# Patient Record
Sex: Female | Born: 1940 | Race: Black or African American | Hispanic: No | Marital: Married | Smoking: Former smoker
Health system: Southern US, Community
[De-identification: ages and names within clinical notes are randomized; demographics above are authoritative.]

## PROBLEM LIST (undated history)

## (undated) DIAGNOSIS — E039 Hypothyroidism, unspecified: Secondary | ICD-10-CM

## (undated) DIAGNOSIS — D649 Anemia, unspecified: Secondary | ICD-10-CM

## (undated) DIAGNOSIS — I4891 Unspecified atrial fibrillation: Secondary | ICD-10-CM

## (undated) DIAGNOSIS — E119 Type 2 diabetes mellitus without complications: Secondary | ICD-10-CM

## (undated) DIAGNOSIS — N184 Chronic kidney disease, stage 4 (severe): Secondary | ICD-10-CM

## (undated) DIAGNOSIS — I10 Essential (primary) hypertension: Secondary | ICD-10-CM

## (undated) DIAGNOSIS — C3490 Malignant neoplasm of unspecified part of unspecified bronchus or lung: Secondary | ICD-10-CM

## (undated) HISTORY — DX: Unspecified atrial fibrillation: I48.91

## (undated) HISTORY — DX: Type 2 diabetes mellitus without complications: E11.9

## (undated) HISTORY — DX: Anemia, unspecified: D64.9

## (undated) HISTORY — DX: Chronic kidney disease, stage 4 (severe): N18.4

## (undated) HISTORY — DX: Hypothyroidism, unspecified: E03.9

## (undated) HISTORY — DX: Essential (primary) hypertension: I10

## (undated) HISTORY — DX: Malignant neoplasm of unspecified part of unspecified bronchus or lung: C34.90

## (undated) SURGERY — EGD (ESOPHAGOGASTRODUODENOSCOPY)
Anesthesia: Monitor Anesthesia Care

---

## 2004-10-28 ENCOUNTER — Ambulatory Visit: Payer: Self-pay | Admitting: Family Medicine

## 2004-11-19 ENCOUNTER — Ambulatory Visit: Payer: Self-pay | Admitting: Family Medicine

## 2004-12-02 ENCOUNTER — Ambulatory Visit: Payer: Self-pay | Admitting: Gastroenterology

## 2007-10-21 ENCOUNTER — Inpatient Hospital Stay: Payer: Self-pay | Admitting: Internal Medicine

## 2008-01-09 ENCOUNTER — Ambulatory Visit: Payer: Self-pay | Admitting: Unknown Physician Specialty

## 2008-01-09 LAB — HM COLONOSCOPY

## 2008-03-17 ENCOUNTER — Ambulatory Visit: Payer: Self-pay | Admitting: Family Medicine

## 2009-03-30 ENCOUNTER — Ambulatory Visit: Payer: Self-pay | Admitting: Family Medicine

## 2010-04-13 ENCOUNTER — Ambulatory Visit: Payer: Self-pay | Admitting: Family Medicine

## 2010-12-31 ENCOUNTER — Ambulatory Visit: Payer: Self-pay | Admitting: Internal Medicine

## 2011-01-13 ENCOUNTER — Ambulatory Visit: Payer: Self-pay | Admitting: Specialist

## 2011-01-21 ENCOUNTER — Ambulatory Visit: Payer: Self-pay | Admitting: Internal Medicine

## 2011-01-21 LAB — COMPREHENSIVE METABOLIC PANEL
Albumin: 2.9 g/dL — ABNORMAL LOW (ref 3.4–5.0)
Alkaline Phosphatase: 95 U/L (ref 50–136)
BUN: 8 mg/dL (ref 7–18)
Bilirubin,Total: 0.4 mg/dL (ref 0.2–1.0)
Creatinine: 1.09 mg/dL (ref 0.60–1.30)
Glucose: 121 mg/dL — ABNORMAL HIGH (ref 65–99)
Osmolality: 273 (ref 275–301)
SGOT(AST): 21 U/L (ref 15–37)
SGPT (ALT): 10 U/L — ABNORMAL LOW
Sodium: 137 mmol/L (ref 136–145)
Total Protein: 8.3 g/dL — ABNORMAL HIGH (ref 6.4–8.2)

## 2011-01-21 LAB — CBC CANCER CENTER
Basophil #: 0 x10 3/mm (ref 0.0–0.1)
Basophil %: 0.5 %
Eosinophil %: 1.1 %
HGB: 9.1 g/dL — ABNORMAL LOW (ref 12.0–16.0)
Lymphocyte #: 1.5 x10 3/mm (ref 1.0–3.6)
MCV: 82 fL (ref 80–100)
Monocyte #: 0.5 x10 3/mm (ref 0.0–0.7)
Monocyte %: 9 %
Neutrophil %: 62.9 %
Platelet: 358 x10 3/mm (ref 150–440)
RBC: 3.47 10*6/uL — ABNORMAL LOW (ref 3.80–5.20)
WBC: 5.8 x10 3/mm (ref 3.6–11.0)

## 2011-01-21 LAB — PROTIME-INR
INR: 1.1
Prothrombin Time: 14.1 secs (ref 11.5–14.7)

## 2011-01-21 LAB — FOLATE: Folic Acid: 40.1 ng/mL (ref 3.1–100.0)

## 2011-01-21 LAB — FERRITIN: Ferritin (ARMC): 20 ng/mL (ref 8–388)

## 2011-01-25 ENCOUNTER — Ambulatory Visit: Payer: Self-pay | Admitting: Internal Medicine

## 2011-02-04 ENCOUNTER — Ambulatory Visit: Payer: Self-pay | Admitting: Internal Medicine

## 2011-02-10 ENCOUNTER — Ambulatory Visit: Payer: Self-pay | Admitting: Internal Medicine

## 2011-02-10 LAB — APTT: Activated PTT: 36.7 secs — ABNORMAL HIGH (ref 23.6–35.9)

## 2011-02-10 LAB — PROTIME-INR
INR: 1.1
Prothrombin Time: 14.3 secs (ref 11.5–14.7)

## 2011-02-14 LAB — CBC CANCER CENTER
Basophil #: 0 x10 3/mm (ref 0.0–0.1)
Basophil %: 0.1 %
HCT: 31.5 % — ABNORMAL LOW (ref 35.0–47.0)
HGB: 10.2 g/dL — ABNORMAL LOW (ref 12.0–16.0)
Lymphocyte %: 14.7 %
Monocyte %: 0.2 %
Neutrophil #: 4 x10 3/mm (ref 1.4–6.5)
RBC: 3.91 10*6/uL (ref 3.80–5.20)
RDW: 16.6 % — ABNORMAL HIGH (ref 11.5–14.5)
WBC: 4.7 x10 3/mm (ref 3.6–11.0)

## 2011-02-14 LAB — BASIC METABOLIC PANEL
Anion Gap: 11 (ref 7–16)
Chloride: 100 mmol/L (ref 98–107)
Co2: 27 mmol/L (ref 21–32)
Osmolality: 282 (ref 275–301)

## 2011-02-21 LAB — CBC CANCER CENTER
Basophil #: 0 x10 3/mm (ref 0.0–0.1)
Basophil %: 0.4 %
Eosinophil %: 1.7 %
HGB: 9.5 g/dL — ABNORMAL LOW (ref 12.0–16.0)
Lymphocyte #: 1.2 x10 3/mm (ref 1.0–3.6)
Lymphocyte %: 37.3 %
MCH: 25.6 pg — ABNORMAL LOW (ref 26.0–34.0)
MCV: 78 fL — ABNORMAL LOW (ref 80–100)
Monocyte %: 4.4 %
Neutrophil %: 56.2 %
RBC: 3.7 10*6/uL — ABNORMAL LOW (ref 3.80–5.20)
RDW: 17.2 % — ABNORMAL HIGH (ref 11.5–14.5)
WBC: 3.1 x10 3/mm — ABNORMAL LOW (ref 3.6–11.0)

## 2011-02-28 LAB — CBC CANCER CENTER
Basophil %: 0.6 %
Eosinophil %: 0.4 %
Lymphocyte #: 1.1 x10 3/mm (ref 1.0–3.6)
MCH: 25 pg — ABNORMAL LOW (ref 26.0–34.0)
MCV: 78 fL — ABNORMAL LOW (ref 80–100)
Monocyte #: 0.7 x10 3/mm (ref 0.0–0.7)
Monocyte %: 17 %
RBC: 3.41 10*6/uL — ABNORMAL LOW (ref 3.80–5.20)
WBC: 4 x10 3/mm (ref 3.6–11.0)

## 2011-02-28 LAB — BASIC METABOLIC PANEL
BUN: 11 mg/dL (ref 7–18)
Calcium, Total: 8.6 mg/dL (ref 8.5–10.1)
EGFR (African American): 59 — ABNORMAL LOW
EGFR (Non-African Amer.): 49 — ABNORMAL LOW
Glucose: 169 mg/dL — ABNORMAL HIGH (ref 65–99)
Osmolality: 281 (ref 275–301)

## 2011-02-28 LAB — HEPATIC FUNCTION PANEL A (ARMC)
Albumin: 2.5 g/dL — ABNORMAL LOW (ref 3.4–5.0)
Alkaline Phosphatase: 93 U/L (ref 50–136)
Bilirubin, Direct: 0.1 mg/dL (ref 0.00–0.20)
Total Protein: 7.4 g/dL (ref 6.4–8.2)

## 2011-03-04 ENCOUNTER — Ambulatory Visit: Payer: Self-pay | Admitting: Internal Medicine

## 2011-03-07 LAB — CBC CANCER CENTER
Basophil %: 0.6 %
Eosinophil %: 0.4 %
Lymphocyte #: 1.5 x10 3/mm (ref 1.0–3.6)
Lymphocyte %: 30.7 %
MCH: 25.5 pg — ABNORMAL LOW (ref 26.0–34.0)
MCV: 79 fL — ABNORMAL LOW (ref 80–100)
Monocyte #: 0.4 x10 3/mm (ref 0.0–0.7)
Neutrophil %: 59.4 %
Platelet: 269 x10 3/mm (ref 150–440)
WBC: 4.8 x10 3/mm (ref 3.6–11.0)

## 2011-03-11 LAB — BASIC METABOLIC PANEL
Anion Gap: 11 (ref 7–16)
Calcium, Total: 9.2 mg/dL (ref 8.5–10.1)
Chloride: 98 mmol/L (ref 98–107)
Co2: 27 mmol/L (ref 21–32)
EGFR (African American): 57 — ABNORMAL LOW
Osmolality: 281 (ref 275–301)

## 2011-03-18 LAB — IRON AND TIBC
Iron Bind.Cap.(Total): 291 ug/dL (ref 250–450)
Iron Saturation: 21 %
Iron: 61 ug/dL (ref 50–170)
Unbound Iron-Bind.Cap.: 230 ug/dL

## 2011-03-18 LAB — CBC CANCER CENTER
Basophil #: 0 x10 3/mm (ref 0.0–0.1)
Basophil %: 0.2 %
Eosinophil #: 0.1 x10 3/mm (ref 0.0–0.7)
Eosinophil %: 3 %
HCT: 25.9 % — ABNORMAL LOW (ref 35.0–47.0)
Lymphocyte %: 55.5 %
MCHC: 32.9 g/dL (ref 32.0–36.0)
Monocyte %: 3.8 %
Neutrophil #: 0.8 x10 3/mm — ABNORMAL LOW (ref 1.4–6.5)
Neutrophil %: 37.5 %
Platelet: 238 x10 3/mm (ref 150–440)
RBC: 3.26 10*6/uL — ABNORMAL LOW (ref 3.80–5.20)
RDW: 19.3 % — ABNORMAL HIGH (ref 11.5–14.5)
WBC: 2.1 x10 3/mm — ABNORMAL LOW (ref 3.6–11.0)

## 2011-03-18 LAB — FOLATE: Folic Acid: >100 ng/mL — ABNORMAL HIGH (ref 3.1–100.0)

## 2011-03-25 LAB — CBC CANCER CENTER
Basophil #: 0 x10 3/mm (ref 0.0–0.1)
Eosinophil #: 0 x10 3/mm (ref 0.0–0.7)
Lymphocyte #: 1.5 x10 3/mm (ref 1.0–3.6)
Lymphocyte %: 28.7 %
MCHC: 32.5 g/dL (ref 32.0–36.0)
MCV: 80 fL (ref 80–100)
Monocyte %: 11.4 %
Neutrophil #: 3.1 x10 3/mm (ref 1.4–6.5)
Neutrophil %: 59.3 %
Platelet: 237 x10 3/mm (ref 150–440)
RBC: 3.13 10*6/uL — ABNORMAL LOW (ref 3.80–5.20)
RDW: 21 % — ABNORMAL HIGH (ref 11.5–14.5)
WBC: 5.2 x10 3/mm (ref 3.6–11.0)

## 2011-03-25 LAB — HEPATIC FUNCTION PANEL A (ARMC)
Albumin: 3 g/dL — ABNORMAL LOW (ref 3.4–5.0)
Bilirubin,Total: 0.3 mg/dL (ref 0.2–1.0)
SGOT(AST): 21 U/L (ref 15–37)
SGPT (ALT): 19 U/L
Total Protein: 7.8 g/dL (ref 6.4–8.2)

## 2011-03-25 LAB — BASIC METABOLIC PANEL
BUN: 11 mg/dL (ref 7–18)
Calcium, Total: 9 mg/dL (ref 8.5–10.1)
Creatinine: 1.12 mg/dL (ref 0.60–1.30)
EGFR (African American): >60
EGFR (Non-African Amer.): 51 — ABNORMAL LOW
Glucose: 166 mg/dL — ABNORMAL HIGH (ref 65–99)
Osmolality: 281 (ref 275–301)
Potassium: 3.7 mmol/L (ref 3.5–5.1)
Sodium: 139 mmol/L (ref 136–145)

## 2011-04-04 ENCOUNTER — Ambulatory Visit: Payer: Self-pay | Admitting: Internal Medicine

## 2011-04-04 LAB — BASIC METABOLIC PANEL
Anion Gap: 12 (ref 7–16)
BUN: 12 mg/dL (ref 7–18)
Calcium, Total: 9.3 mg/dL (ref 8.5–10.1)
EGFR (African American): >60
EGFR (Non-African Amer.): 52 — ABNORMAL LOW
Osmolality: 278 (ref 275–301)

## 2011-04-04 LAB — CBC CANCER CENTER
Basophil #: 0 x10 3/mm (ref 0.0–0.1)
Basophil %: 0.1 %
Eosinophil #: 0 x10 3/mm (ref 0.0–0.7)
HCT: 26.7 % — ABNORMAL LOW (ref 35.0–47.0)
HGB: 8.9 g/dL — ABNORMAL LOW (ref 12.0–16.0)
Lymphocyte #: 0.3 x10 3/mm — ABNORMAL LOW (ref 1.0–3.6)
Lymphocyte %: 10.7 %
MCH: 27.5 pg (ref 26.0–34.0)
MCHC: 33.2 g/dL (ref 32.0–36.0)
Monocyte #: 0 x10 3/mm (ref 0.0–0.7)
Monocyte %: 0.5 %
Neutrophil #: 2.9 x10 3/mm (ref 1.4–6.5)
Neutrophil %: 88.6 %
Platelet: 171 x10 3/mm (ref 150–440)
RBC: 3.22 10*6/uL — ABNORMAL LOW (ref 3.80–5.20)

## 2011-04-05 ENCOUNTER — Inpatient Hospital Stay: Payer: Self-pay | Admitting: Internal Medicine

## 2011-04-05 LAB — CBC CANCER CENTER
Basophil #: 0 x10 3/mm (ref 0.0–0.1)
Eosinophil %: 0 %
HCT: 26.4 % — ABNORMAL LOW (ref 35.0–47.0)
HGB: 8.6 g/dL — ABNORMAL LOW (ref 12.0–16.0)
Lymphocyte %: 6.9 %
MCH: 27.9 pg (ref 26.0–34.0)
MCHC: 32.6 g/dL (ref 32.0–36.0)
MCV: 86 fL (ref 80–100)
Monocyte #: 0.5 x10 3/mm (ref 0.0–0.7)
Monocyte %: 5.4 %
Neutrophil #: 7.5 x10 3/mm — ABNORMAL HIGH (ref 1.4–6.5)
Neutrophil %: 87.7 %

## 2011-04-05 LAB — HEPATIC FUNCTION PANEL A (ARMC)
Albumin: 2.9 g/dL — ABNORMAL LOW (ref 3.4–5.0)
Alkaline Phosphatase: 286 U/L — ABNORMAL HIGH (ref 50–136)
Bilirubin, Direct: 4.1 mg/dL — ABNORMAL HIGH (ref 0.00–0.20)
Bilirubin,Total: 5 mg/dL — ABNORMAL HIGH (ref 0.2–1.0)
SGOT(AST): 328 U/L — ABNORMAL HIGH (ref 15–37)
SGPT (ALT): 262 U/L — ABNORMAL HIGH
Total Protein: 7.6 g/dL (ref 6.4–8.2)

## 2011-04-05 LAB — BASIC METABOLIC PANEL
Anion Gap: 9 (ref 7–16)
BUN: 22 mg/dL — ABNORMAL HIGH (ref 7–18)
Calcium, Total: 8.4 mg/dL — ABNORMAL LOW (ref 8.5–10.1)
Chloride: 100 mmol/L (ref 98–107)
Co2: 27 mmol/L (ref 21–32)
Creatinine: 1.24 mg/dL (ref 0.60–1.30)
EGFR (African American): 55 — ABNORMAL LOW
EGFR (Non-African Amer.): 46 — ABNORMAL LOW
Osmolality: 282 (ref 275–301)
Potassium: 2.9 mmol/L — ABNORMAL LOW (ref 3.5–5.1)

## 2011-04-05 LAB — CK: CK, Total: 45 U/L (ref 21–215)

## 2011-04-06 LAB — COMPREHENSIVE METABOLIC PANEL
Albumin: 2.4 g/dL — ABNORMAL LOW (ref 3.4–5.0)
Alkaline Phosphatase: 251 U/L — ABNORMAL HIGH (ref 50–136)
Anion Gap: 12 (ref 7–16)
Chloride: 109 mmol/L — ABNORMAL HIGH (ref 98–107)
Co2: 21 mmol/L (ref 21–32)
Creatinine: 1.26 mg/dL (ref 0.60–1.30)
EGFR (African American): 54 — ABNORMAL LOW
Glucose: 115 mg/dL — ABNORMAL HIGH (ref 65–99)
Osmolality: 286 (ref 275–301)
SGPT (ALT): 184 U/L — ABNORMAL HIGH
Total Protein: 6 g/dL — ABNORMAL LOW (ref 6.4–8.2)

## 2011-04-06 LAB — CBC WITH DIFFERENTIAL/PLATELET
Lymphocyte #: 0.7 10*3/uL — ABNORMAL LOW (ref 1.0–3.6)
MCH: 27.7 pg (ref 26.0–34.0)
MCHC: 32.4 g/dL (ref 32.0–36.0)
MCV: 85 fL (ref 80–100)
Monocyte %: 9.4 %
Neutrophil #: 3.9 10*3/uL (ref 1.4–6.5)
Neutrophil %: 75.9 %
Platelet: 146 10*3/uL — ABNORMAL LOW (ref 150–440)
RDW: 30 % — ABNORMAL HIGH (ref 11.5–14.5)

## 2011-04-06 LAB — PROTIME-INR: Prothrombin Time: 14.9 secs — ABNORMAL HIGH (ref 11.5–14.7)

## 2011-04-06 LAB — MAGNESIUM: Magnesium: 1.4 mg/dL — ABNORMAL LOW

## 2011-04-07 DIAGNOSIS — R6889 Other general symptoms and signs: Secondary | ICD-10-CM | POA: Diagnosis not present

## 2011-04-07 LAB — CBC WITH DIFFERENTIAL/PLATELET
Basophil #: 0 10*3/uL (ref 0.0–0.1)
Basophil %: 0.3 %
Eosinophil #: 0 10*3/uL (ref 0.0–0.7)
HCT: 28.5 % — ABNORMAL LOW (ref 35.0–47.0)
Lymphocyte %: 13.7 %
MCH: 28.7 pg (ref 26.0–34.0)
MCV: 87 fL (ref 80–100)
Monocyte #: 0.4 10*3/uL (ref 0.0–0.7)
Monocyte %: 6.9 %
Neutrophil #: 4.7 10*3/uL (ref 1.4–6.5)
Platelet: 134 10*3/uL — ABNORMAL LOW (ref 150–440)
RBC: 3.27 10*6/uL — ABNORMAL LOW (ref 3.80–5.20)
WBC: 6 10*3/uL (ref 3.6–11.0)

## 2011-04-07 LAB — BASIC METABOLIC PANEL
Anion Gap: 12 (ref 7–16)
Calcium, Total: 8.1 mg/dL — ABNORMAL LOW (ref 8.5–10.1)
Co2: 21 mmol/L (ref 21–32)
EGFR (Non-African Amer.): >60
Glucose: 76 mg/dL (ref 65–99)
Osmolality: 284 (ref 275–301)
Sodium: 143 mmol/L (ref 136–145)

## 2011-04-07 LAB — HEPATIC FUNCTION PANEL A (ARMC)
Alkaline Phosphatase: 299 U/L — ABNORMAL HIGH (ref 50–136)
Bilirubin,Total: 6.6 mg/dL — ABNORMAL HIGH (ref 0.2–1.0)
SGOT(AST): 110 U/L — ABNORMAL HIGH (ref 15–37)
Total Protein: 6.4 g/dL (ref 6.4–8.2)

## 2011-04-07 LAB — MAGNESIUM: Magnesium: 2.1 mg/dL

## 2011-04-14 LAB — COMPREHENSIVE METABOLIC PANEL
Albumin: 2.9 g/dL — ABNORMAL LOW (ref 3.4–5.0)
Alkaline Phosphatase: 219 U/L — ABNORMAL HIGH (ref 50–136)
BUN: 9 mg/dL (ref 7–18)
Bilirubin,Total: 0.9 mg/dL (ref 0.2–1.0)
Calcium, Total: 9.5 mg/dL (ref 8.5–10.1)
Chloride: 102 mmol/L (ref 98–107)
Creatinine: 0.96 mg/dL (ref 0.60–1.30)
EGFR (Non-African Amer.): >60
Glucose: 133 mg/dL — ABNORMAL HIGH (ref 65–99)
Potassium: 3.7 mmol/L (ref 3.5–5.1)
SGOT(AST): 22 U/L (ref 15–37)
Sodium: 139 mmol/L (ref 136–145)
Total Protein: 7.7 g/dL (ref 6.4–8.2)

## 2011-04-14 LAB — CBC CANCER CENTER
Basophil %: 0.4 %
Eosinophil #: 0.1 x10 3/mm (ref 0.0–0.7)
Eosinophil %: 1.7 %
HCT: 32.3 % — ABNORMAL LOW (ref 35.0–47.0)
Lymphocyte %: 27 %
MCH: 28.7 pg (ref 26.0–34.0)
MCV: 89 fL (ref 80–100)
Monocyte #: 0.4 x10 3/mm (ref 0.2–0.9)
Neutrophil #: 2.9 x10 3/mm (ref 1.4–6.5)
Platelet: 262 x10 3/mm (ref 150–440)

## 2011-04-14 LAB — LIPASE, BLOOD: Lipase: 719 U/L — ABNORMAL HIGH (ref 73–393)

## 2011-04-14 LAB — PROTIME-INR
INR: 0.9
Prothrombin Time: 13 secs (ref 11.5–14.7)

## 2011-04-18 ENCOUNTER — Ambulatory Visit: Payer: Self-pay | Admitting: Internal Medicine

## 2011-04-25 LAB — BASIC METABOLIC PANEL
Calcium, Total: 9.2 mg/dL (ref 8.5–10.1)
Chloride: 100 mmol/L (ref 98–107)
Creatinine: 1.05 mg/dL (ref 0.60–1.30)
EGFR (African American): >60
EGFR (Non-African Amer.): 54 — ABNORMAL LOW
Glucose: 239 mg/dL — ABNORMAL HIGH (ref 65–99)
Potassium: 4 mmol/L (ref 3.5–5.1)

## 2011-04-25 LAB — HEPATIC FUNCTION PANEL A (ARMC)
Albumin: 3.1 g/dL — ABNORMAL LOW (ref 3.4–5.0)
Alkaline Phosphatase: 138 U/L — ABNORMAL HIGH (ref 50–136)
Bilirubin,Total: 0.7 mg/dL (ref 0.2–1.0)
SGOT(AST): 20 U/L (ref 15–37)

## 2011-04-25 LAB — CBC CANCER CENTER
Basophil #: 0 x10 3/mm (ref 0.0–0.1)
Eosinophil %: 0 %
HGB: 11.4 g/dL — ABNORMAL LOW (ref 12.0–16.0)
Lymphocyte #: 0.7 x10 3/mm — ABNORMAL LOW (ref 1.0–3.6)
Lymphocyte %: 13.9 %
MCHC: 32.4 g/dL (ref 32.0–36.0)
MCV: 90 fL (ref 80–100)
Monocyte %: 0.7 %
Neutrophil #: 4.2 x10 3/mm (ref 1.4–6.5)
RBC: 3.89 10*6/uL (ref 3.80–5.20)
RDW: 26.4 % — ABNORMAL HIGH (ref 11.5–14.5)
WBC: 5 x10 3/mm (ref 3.6–11.0)

## 2011-05-03 LAB — CBC CANCER CENTER
Basophil #: 0 x10 3/mm (ref 0.0–0.1)
Basophil %: 0.1 %
Eosinophil #: 0 x10 3/mm (ref 0.0–0.7)
Eosinophil %: 0 %
HCT: 32.2 % — ABNORMAL LOW (ref 35.0–47.0)
HGB: 10.6 g/dL — ABNORMAL LOW (ref 12.0–16.0)
Lymphocyte #: 0.6 x10 3/mm — ABNORMAL LOW (ref 1.0–3.6)
Lymphocyte %: 8.8 %
MCHC: 32.8 g/dL (ref 32.0–36.0)
Monocyte %: 0.5 %
Neutrophil #: 5.8 x10 3/mm (ref 1.4–6.5)
Neutrophil %: 90.6 %
Platelet: 206 x10 3/mm (ref 150–440)
RBC: 3.56 10*6/uL — ABNORMAL LOW (ref 3.80–5.20)
RDW: 25.7 % — ABNORMAL HIGH (ref 11.5–14.5)
WBC: 6.4 x10 3/mm (ref 3.6–11.0)

## 2011-05-03 LAB — BASIC METABOLIC PANEL
Anion Gap: 10 (ref 7–16)
BUN: 16 mg/dL (ref 7–18)
Chloride: 103 mmol/L (ref 98–107)
Co2: 24 mmol/L (ref 21–32)
EGFR (Non-African Amer.): 54 — ABNORMAL LOW
Glucose: 219 mg/dL — ABNORMAL HIGH (ref 65–99)
Osmolality: 282 (ref 275–301)
Potassium: 4.3 mmol/L (ref 3.5–5.1)
Sodium: 137 mmol/L (ref 136–145)

## 2011-05-04 ENCOUNTER — Ambulatory Visit: Payer: Self-pay | Admitting: Internal Medicine

## 2011-05-10 LAB — CBC CANCER CENTER
Basophil #: 0 x10 3/mm (ref 0.0–0.1)
Basophil %: 0.1 %
Eosinophil #: 0 x10 3/mm (ref 0.0–0.7)
HCT: 29.8 % — ABNORMAL LOW (ref 35.0–47.0)
HGB: 9.7 g/dL — ABNORMAL LOW (ref 12.0–16.0)
Lymphocyte %: 8.6 %
MCV: 91 fL (ref 80–100)
Monocyte #: 0.1 x10 3/mm — ABNORMAL LOW (ref 0.2–0.9)
Monocyte %: 2.1 %
Neutrophil %: 89.2 %
Platelet: 232 x10 3/mm (ref 150–440)
RDW: 25 % — ABNORMAL HIGH (ref 11.5–14.5)
WBC: 4.1 x10 3/mm (ref 3.6–11.0)

## 2011-05-10 LAB — CREATININE, SERUM: EGFR (Non-African Amer.): 59 — ABNORMAL LOW

## 2011-05-17 LAB — CBC CANCER CENTER
Basophil #: 0 x10 3/mm (ref 0.0–0.1)
Basophil %: 0.4 %
Eosinophil #: 0 x10 3/mm (ref 0.0–0.7)
Eosinophil %: 0.1 %
HCT: 28.3 % — ABNORMAL LOW (ref 35.0–47.0)
HGB: 9.3 g/dL — ABNORMAL LOW (ref 12.0–16.0)
Lymphocyte #: 0.4 x10 3/mm — ABNORMAL LOW (ref 1.0–3.6)
Lymphocyte %: 15.3 %
MCH: 30.6 pg (ref 26.0–34.0)
MCV: 93 fL (ref 80–100)
Monocyte #: 0.3 x10 3/mm (ref 0.2–0.9)
Monocyte %: 10.8 %
Neutrophil #: 1.8 x10 3/mm (ref 1.4–6.5)
Neutrophil %: 73.4 %
RBC: 3.03 10*6/uL — ABNORMAL LOW (ref 3.80–5.20)
RDW: 24.8 % — ABNORMAL HIGH (ref 11.5–14.5)
WBC: 2.5 x10 3/mm — ABNORMAL LOW (ref 3.6–11.0)

## 2011-05-24 LAB — CBC CANCER CENTER
Basophil #: 0 x10 3/mm (ref 0.0–0.1)
Basophil %: 0 %
Eosinophil #: 0 x10 3/mm (ref 0.0–0.7)
Eosinophil %: 0 %
Lymphocyte #: 0.2 x10 3/mm — ABNORMAL LOW (ref 1.0–3.6)
Lymphocyte %: 7 %
MCHC: 32.8 g/dL (ref 32.0–36.0)
MCV: 94 fL (ref 80–100)
Monocyte #: 0 x10 3/mm — ABNORMAL LOW (ref 0.2–0.9)
Monocyte %: 1.4 %
Neutrophil #: 2.7 x10 3/mm (ref 1.4–6.5)
Platelet: 141 x10 3/mm — ABNORMAL LOW (ref 150–440)
RBC: 3.05 10*6/uL — ABNORMAL LOW (ref 3.80–5.20)
RDW: 23.4 % — ABNORMAL HIGH (ref 11.5–14.5)

## 2011-05-24 LAB — BASIC METABOLIC PANEL
BUN: 14 mg/dL (ref 7–18)
Calcium, Total: 9 mg/dL (ref 8.5–10.1)
Chloride: 101 mmol/L (ref 98–107)
Creatinine: 1.12 mg/dL (ref 0.60–1.30)
EGFR (African American): 58 — ABNORMAL LOW
EGFR (Non-African Amer.): 50 — ABNORMAL LOW
Glucose: 188 mg/dL — ABNORMAL HIGH (ref 65–99)
Osmolality: 279 (ref 275–301)
Sodium: 137 mmol/L (ref 136–145)

## 2011-06-01 LAB — CBC CANCER CENTER
Eosinophil #: 0 x10 3/mm (ref 0.0–0.7)
Eosinophil %: 0.1 %
HGB: 8.5 g/dL — ABNORMAL LOW (ref 12.0–16.0)
Lymphocyte %: 9.5 %
MCV: 93 fL (ref 80–100)
Monocyte #: 0 x10 3/mm — ABNORMAL LOW (ref 0.2–0.9)
Monocyte %: 3.3 %
Neutrophil #: 1.2 x10 3/mm — ABNORMAL LOW (ref 1.4–6.5)
Neutrophil %: 87 %
Platelet: 69 x10 3/mm — ABNORMAL LOW (ref 150–440)
RDW: 21 % — ABNORMAL HIGH (ref 11.5–14.5)
WBC: 1.4 x10 3/mm — CL (ref 3.6–11.0)

## 2011-06-01 LAB — CREATININE, SERUM
Creatinine: 1.06 mg/dL (ref 0.60–1.30)
EGFR (African American): >60

## 2011-06-04 ENCOUNTER — Ambulatory Visit: Payer: Self-pay | Admitting: Internal Medicine

## 2011-06-07 LAB — CBC CANCER CENTER
Basophil #: 0 x10 3/mm (ref 0.0–0.1)
Eosinophil #: 0 x10 3/mm (ref 0.0–0.7)
Eosinophil %: 0 %
HCT: 23.5 % — ABNORMAL LOW (ref 35.0–47.0)
HGB: 7.8 g/dL — ABNORMAL LOW (ref 12.0–16.0)
Lymphocyte #: 0.2 x10 3/mm — ABNORMAL LOW (ref 1.0–3.6)
Lymphocyte %: 11 %
MCH: 31.8 pg (ref 26.0–34.0)
MCHC: 33.1 g/dL (ref 32.0–36.0)
MCV: 96 fL (ref 80–100)
Monocyte %: 3.6 %
Platelet: 94 x10 3/mm — ABNORMAL LOW (ref 150–440)
RBC: 2.44 10*6/uL — ABNORMAL LOW (ref 3.80–5.20)
WBC: 2.1 x10 3/mm — ABNORMAL LOW (ref 3.6–11.0)

## 2011-06-07 LAB — BASIC METABOLIC PANEL
Anion Gap: 11 (ref 7–16)
BUN: 14 mg/dL (ref 7–18)
Calcium, Total: 9.2 mg/dL (ref 8.5–10.1)
Chloride: 100 mmol/L (ref 98–107)
Creatinine: 1.12 mg/dL (ref 0.60–1.30)
EGFR (African American): 58 — ABNORMAL LOW
Glucose: 233 mg/dL — ABNORMAL HIGH (ref 65–99)
Osmolality: 280 (ref 275–301)
Potassium: 4.5 mmol/L (ref 3.5–5.1)
Sodium: 136 mmol/L (ref 136–145)

## 2011-06-07 LAB — IRON AND TIBC
Iron Bind.Cap.(Total): 273 ug/dL (ref 250–450)
Iron Saturation: 25 %
Iron: 69 ug/dL (ref 50–170)
Unbound Iron-Bind.Cap.: 204 ug/dL

## 2011-06-07 LAB — FERRITIN: Ferritin (ARMC): 223 ng/mL (ref 8–388)

## 2011-06-08 LAB — CANCER CENTER HEMOGLOBIN: HGB: 10.1 g/dL — ABNORMAL LOW (ref 12.0–16.0)

## 2011-06-14 LAB — CBC CANCER CENTER
Basophil %: 0.5 %
Eosinophil #: 0 x10 3/mm (ref 0.0–0.7)
Eosinophil %: 0 %
HCT: 27.6 % — ABNORMAL LOW (ref 35.0–47.0)
HGB: 9.3 g/dL — ABNORMAL LOW (ref 12.0–16.0)
Lymphocyte #: 0.2 x10 3/mm — ABNORMAL LOW (ref 1.0–3.6)
MCV: 93 fL (ref 80–100)
Monocyte #: 0.1 x10 3/mm — ABNORMAL LOW (ref 0.2–0.9)
Monocyte %: 8.5 %
Neutrophil %: 62.1 %

## 2011-06-14 LAB — BASIC METABOLIC PANEL
BUN: 20 mg/dL — ABNORMAL HIGH (ref 7–18)
Chloride: 100 mmol/L (ref 98–107)
Co2: 24 mmol/L (ref 21–32)
EGFR (African American): >60
EGFR (Non-African Amer.): 57 — ABNORMAL LOW
Glucose: 220 mg/dL — ABNORMAL HIGH (ref 65–99)
Osmolality: 279 (ref 275–301)
Potassium: 5 mmol/L (ref 3.5–5.1)
Sodium: 135 mmol/L — ABNORMAL LOW (ref 136–145)

## 2011-06-28 LAB — BASIC METABOLIC PANEL
Anion Gap: 11 (ref 7–16)
Calcium, Total: 9 mg/dL (ref 8.5–10.1)
Creatinine: 1 mg/dL (ref 0.60–1.30)
EGFR (African American): >60
EGFR (Non-African Amer.): 57 — ABNORMAL LOW
Glucose: 199 mg/dL — ABNORMAL HIGH (ref 65–99)
Potassium: 4.5 mmol/L (ref 3.5–5.1)

## 2011-06-28 LAB — CBC CANCER CENTER
Basophil #: 0 x10 3/mm (ref 0.0–0.1)
Basophil %: 0.1 %
HGB: 9 g/dL — ABNORMAL LOW (ref 12.0–16.0)
Lymphocyte #: 0.5 x10 3/mm — ABNORMAL LOW (ref 1.0–3.6)
MCV: 98 fL (ref 80–100)
Monocyte #: 0 x10 3/mm — ABNORMAL LOW (ref 0.2–0.9)
Monocyte %: 1.2 %
Neutrophil #: 3 x10 3/mm (ref 1.4–6.5)
Neutrophil %: 84.4 %

## 2011-06-28 LAB — HEPATIC FUNCTION PANEL A (ARMC)
Albumin: 2.9 g/dL — ABNORMAL LOW (ref 3.4–5.0)
Alkaline Phosphatase: 77 U/L (ref 50–136)
SGPT (ALT): 19 U/L

## 2011-07-04 ENCOUNTER — Ambulatory Visit: Payer: Self-pay | Admitting: Internal Medicine

## 2011-07-05 LAB — CBC CANCER CENTER
Basophil #: 0 x10 3/mm (ref 0.0–0.1)
Basophil %: 0.3 %
Eosinophil #: 0 x10 3/mm (ref 0.0–0.7)
Eosinophil %: 0.4 %
HCT: 26 % — ABNORMAL LOW (ref 35.0–47.0)
HGB: 8.7 g/dL — ABNORMAL LOW (ref 12.0–16.0)
Lymphocyte #: 0.8 x10 3/mm — ABNORMAL LOW (ref 1.0–3.6)
Lymphocyte %: 25 %
MCH: 33 pg (ref 26.0–34.0)
MCHC: 33.5 g/dL (ref 32.0–36.0)
MCV: 99 fL (ref 80–100)
Monocyte #: 0.2 x10 3/mm (ref 0.2–0.9)
Monocyte %: 6.4 %
Neutrophil #: 2.3 x10 3/mm (ref 1.4–6.5)
Neutrophil %: 67.9 %
Platelet: 130 x10 3/mm — ABNORMAL LOW (ref 150–440)
RBC: 2.64 10*6/uL — ABNORMAL LOW (ref 3.80–5.20)
RDW: 20.6 % — ABNORMAL HIGH (ref 11.5–14.5)
WBC: 3.4 x10 3/mm — ABNORMAL LOW (ref 3.6–11.0)

## 2011-07-05 LAB — BASIC METABOLIC PANEL
Anion Gap: 9 (ref 7–16)
BUN: 20 mg/dL — ABNORMAL HIGH (ref 7–18)
Calcium, Total: 9.1 mg/dL (ref 8.5–10.1)
Chloride: 102 mmol/L (ref 98–107)
Co2: 28 mmol/L (ref 21–32)
Creatinine: 1.13 mg/dL (ref 0.60–1.30)
EGFR (African American): 57 — ABNORMAL LOW
EGFR (Non-African Amer.): 50 — ABNORMAL LOW
Glucose: 192 mg/dL — ABNORMAL HIGH (ref 65–99)
Osmolality: 285 (ref 275–301)
Potassium: 4.2 mmol/L (ref 3.5–5.1)
Sodium: 139 mmol/L (ref 136–145)

## 2011-07-12 LAB — CBC CANCER CENTER
Basophil #: 0 x10 3/mm (ref 0.0–0.1)
Basophil %: 0.2 %
Eosinophil #: 0 x10 3/mm (ref 0.0–0.7)
Eosinophil %: 0 %
HCT: 22.1 % — ABNORMAL LOW (ref 35.0–47.0)
HGB: 7.5 g/dL — ABNORMAL LOW (ref 12.0–16.0)
Lymphocyte #: 0.3 x10 3/mm — ABNORMAL LOW (ref 1.0–3.6)
MCH: 32.9 pg (ref 26.0–34.0)
MCHC: 33.8 g/dL (ref 32.0–36.0)
MCV: 97 fL (ref 80–100)
Monocyte %: 2.3 %
Neutrophil #: 1.3 x10 3/mm — ABNORMAL LOW (ref 1.4–6.5)
RBC: 2.27 10*6/uL — ABNORMAL LOW (ref 3.80–5.20)
RDW: 20.1 % — ABNORMAL HIGH (ref 11.5–14.5)
WBC: 1.6 x10 3/mm — CL (ref 3.6–11.0)

## 2011-07-12 LAB — BASIC METABOLIC PANEL
Anion Gap: 10 (ref 7–16)
BUN: 18 mg/dL (ref 7–18)
Chloride: 100 mmol/L (ref 98–107)
Co2: 23 mmol/L (ref 21–32)
Creatinine: 1.31 mg/dL — ABNORMAL HIGH (ref 0.60–1.30)
EGFR (African American): 48 — ABNORMAL LOW
Glucose: 227 mg/dL — ABNORMAL HIGH (ref 65–99)
Potassium: 4.6 mmol/L (ref 3.5–5.1)
Sodium: 133 mmol/L — ABNORMAL LOW (ref 136–145)

## 2011-07-12 LAB — HEPATIC FUNCTION PANEL A (ARMC)
Albumin: 3.3 g/dL — ABNORMAL LOW (ref 3.4–5.0)
Alkaline Phosphatase: 79 U/L (ref 50–136)
Bilirubin, Direct: 0.3 mg/dL — ABNORMAL HIGH (ref 0.00–0.20)
Bilirubin,Total: 1.1 mg/dL — ABNORMAL HIGH (ref 0.2–1.0)
SGOT(AST): 13 U/L — ABNORMAL LOW (ref 15–37)
Total Protein: 7.5 g/dL (ref 6.4–8.2)

## 2011-07-26 LAB — CBC CANCER CENTER
Basophil %: 0.1 %
Eosinophil #: 0 x10 3/mm (ref 0.0–0.7)
Eosinophil %: 0 %
Lymphocyte %: 10.5 %
MCH: 30.4 pg (ref 26.0–34.0)
MCV: 93 fL (ref 80–100)
Monocyte #: 0 x10 3/mm — ABNORMAL LOW (ref 0.2–0.9)
Monocyte %: 1.6 %
Neutrophil %: 87.8 %
Platelet: 96 x10 3/mm — ABNORMAL LOW (ref 150–440)
WBC: 3 x10 3/mm — ABNORMAL LOW (ref 3.6–11.0)

## 2011-07-26 LAB — BASIC METABOLIC PANEL
Anion Gap: 13 (ref 7–16)
Calcium, Total: 9.2 mg/dL (ref 8.5–10.1)
Co2: 21 mmol/L (ref 21–32)
EGFR (African American): 51 — ABNORMAL LOW
Osmolality: 276 (ref 275–301)

## 2011-07-28 DIAGNOSIS — J45909 Unspecified asthma, uncomplicated: Secondary | ICD-10-CM

## 2011-07-28 LAB — COMPREHENSIVE METABOLIC PANEL
Alkaline Phosphatase: 74 U/L (ref 50–136)
Anion Gap: 11 (ref 7–16)
BUN: 17 mg/dL (ref 7–18)
Bilirubin,Total: 0.4 mg/dL (ref 0.2–1.0)
Chloride: 104 mmol/L (ref 98–107)
Creatinine: 1.21 mg/dL (ref 0.60–1.30)
EGFR (African American): 53 — ABNORMAL LOW
Potassium: 4.3 mmol/L (ref 3.5–5.1)
SGPT (ALT): 15 U/L
Sodium: 139 mmol/L (ref 136–145)
Total Protein: 6.6 g/dL (ref 6.4–8.2)

## 2011-07-28 LAB — CBC CANCER CENTER
Basophil #: 0 x10 3/mm (ref 0.0–0.1)
Eosinophil %: 0.3 %
Lymphocyte #: 0.9 x10 3/mm — ABNORMAL LOW (ref 1.0–3.6)
Lymphocyte %: 22 %
MCHC: 33.6 g/dL (ref 32.0–36.0)
MCV: 95 fL (ref 80–100)
Monocyte #: 0.6 x10 3/mm (ref 0.2–0.9)
Neutrophil #: 2.5 x10 3/mm (ref 1.4–6.5)
Neutrophil %: 63.2 %
Platelet: 95 x10 3/mm — ABNORMAL LOW (ref 150–440)
RBC: 2.3 10*6/uL — ABNORMAL LOW (ref 3.80–5.20)
RDW: 27.5 % — ABNORMAL HIGH (ref 11.5–14.5)
WBC: 4 x10 3/mm (ref 3.6–11.0)

## 2011-07-28 LAB — PROTIME-INR
INR: 1
Prothrombin Time: 13.4 secs (ref 11.5–14.7)

## 2011-07-28 LAB — APTT: Activated PTT: 29.2 secs (ref 23.6–35.9)

## 2011-08-01 ENCOUNTER — Ambulatory Visit: Payer: Self-pay

## 2011-08-04 ENCOUNTER — Ambulatory Visit: Payer: Self-pay | Admitting: Internal Medicine

## 2011-08-05 LAB — BASIC METABOLIC PANEL
Anion Gap: 12 (ref 7–16)
BUN: 20 mg/dL — ABNORMAL HIGH (ref 7–18)
Chloride: 102 mmol/L (ref 98–107)
Creatinine: 1.24 mg/dL (ref 0.60–1.30)
EGFR (African American): 51 — ABNORMAL LOW
EGFR (Non-African Amer.): 44 — ABNORMAL LOW
Osmolality: 286 (ref 275–301)
Potassium: 4.2 mmol/L (ref 3.5–5.1)
Sodium: 138 mmol/L (ref 136–145)

## 2011-08-05 LAB — CBC CANCER CENTER
Basophil %: 0.3 %
Eosinophil %: 0 %
HCT: 31.1 % — ABNORMAL LOW (ref 35.0–47.0)
HGB: 10.6 g/dL — ABNORMAL LOW (ref 12.0–16.0)
Lymphocyte #: 0.3 x10 3/mm — ABNORMAL LOW (ref 1.0–3.6)
MCV: 95 fL (ref 80–100)
Monocyte %: 1.3 %
Neutrophil #: 3.9 x10 3/mm (ref 1.4–6.5)
RBC: 3.29 10*6/uL — ABNORMAL LOW (ref 3.80–5.20)
RDW: 24.8 % — ABNORMAL HIGH (ref 11.5–14.5)
WBC: 4.3 x10 3/mm (ref 3.6–11.0)

## 2011-08-05 LAB — IRON AND TIBC
Iron Bind.Cap.(Total): 263 ug/dL (ref 250–450)
Unbound Iron-Bind.Cap.: 166 ug/dL

## 2011-08-05 LAB — HEPATIC FUNCTION PANEL A (ARMC)
Albumin: 2.7 g/dL — ABNORMAL LOW (ref 3.4–5.0)
Alkaline Phosphatase: 80 U/L (ref 50–136)
SGOT(AST): 19 U/L (ref 15–37)
Total Protein: 6.6 g/dL (ref 6.4–8.2)

## 2011-08-10 LAB — BASIC METABOLIC PANEL
Anion Gap: 12 (ref 7–16)
BUN: 19 mg/dL — ABNORMAL HIGH (ref 7–18)
Co2: 24 mmol/L (ref 21–32)
Creatinine: 1.58 mg/dL — ABNORMAL HIGH (ref 0.60–1.30)
EGFR (African American): 38 — ABNORMAL LOW
EGFR (Non-African Amer.): 33 — ABNORMAL LOW
Glucose: 329 mg/dL — ABNORMAL HIGH (ref 65–99)
Sodium: 138 mmol/L (ref 136–145)

## 2011-08-10 LAB — CBC CANCER CENTER
Basophil #: 0 x10 3/mm (ref 0.0–0.1)
Eosinophil #: 0 x10 3/mm (ref 0.0–0.7)
HCT: 29.4 % — ABNORMAL LOW (ref 35.0–47.0)
HGB: 10 g/dL — ABNORMAL LOW (ref 12.0–16.0)
Lymphocyte %: 6.4 %
MCV: 97 fL (ref 80–100)
Monocyte %: 2.1 %
Neutrophil #: 4.8 x10 3/mm (ref 1.4–6.5)
Neutrophil %: 91.4 %
RBC: 3.04 10*6/uL — ABNORMAL LOW (ref 3.80–5.20)
WBC: 5.3 x10 3/mm (ref 3.6–11.0)

## 2011-08-15 LAB — BASIC METABOLIC PANEL
Anion Gap: 14 (ref 7–16)
BUN: 25 mg/dL — ABNORMAL HIGH (ref 7–18)
Calcium, Total: 9 mg/dL (ref 8.5–10.1)
Chloride: 99 mmol/L (ref 98–107)
Co2: 21 mmol/L (ref 21–32)
Creatinine: 1.46 mg/dL — ABNORMAL HIGH (ref 0.60–1.30)
EGFR (African American): 42 — ABNORMAL LOW
EGFR (Non-African Amer.): 36 — ABNORMAL LOW
Glucose: 358 mg/dL — ABNORMAL HIGH (ref 65–99)
Sodium: 134 mmol/L — ABNORMAL LOW (ref 136–145)

## 2011-08-15 LAB — CBC CANCER CENTER
Basophil %: 0.2 %
HCT: 28.6 % — ABNORMAL LOW (ref 35.0–47.0)
HGB: 9.7 g/dL — ABNORMAL LOW (ref 12.0–16.0)
Lymphocyte %: 5.1 %
MCHC: 34 g/dL (ref 32.0–36.0)
Monocyte #: 0.1 x10 3/mm — ABNORMAL LOW (ref 0.2–0.9)
Monocyte %: 1.9 %
Neutrophil #: 6.9 x10 3/mm — ABNORMAL HIGH (ref 1.4–6.5)
Neutrophil %: 92.8 %
Platelet: 113 x10 3/mm — ABNORMAL LOW (ref 150–440)
WBC: 7.4 x10 3/mm (ref 3.6–11.0)

## 2011-08-22 LAB — BASIC METABOLIC PANEL
Calcium, Total: 9 mg/dL (ref 8.5–10.1)
EGFR (African American): 39 — ABNORMAL LOW
EGFR (Non-African Amer.): 34 — ABNORMAL LOW
Glucose: 342 mg/dL — ABNORMAL HIGH (ref 65–99)
Osmolality: 278 (ref 275–301)
Potassium: 4.5 mmol/L (ref 3.5–5.1)

## 2011-08-22 LAB — CBC CANCER CENTER
Basophil #: 0 x10 3/mm (ref 0.0–0.1)
Basophil %: 0.2 %
Eosinophil #: 0 x10 3/mm (ref 0.0–0.7)
HGB: 8.6 g/dL — ABNORMAL LOW (ref 12.0–16.0)
Lymphocyte #: 0.2 x10 3/mm — ABNORMAL LOW (ref 1.0–3.6)
Lymphocyte %: 12.3 %
MCH: 32.3 pg (ref 26.0–34.0)
MCV: 95 fL (ref 80–100)
Monocyte %: 10 %
Platelet: 105 x10 3/mm — ABNORMAL LOW (ref 150–440)
RBC: 2.67 10*6/uL — ABNORMAL LOW (ref 3.80–5.20)
WBC: 1.8 x10 3/mm — CL (ref 3.6–11.0)

## 2011-08-29 LAB — BASIC METABOLIC PANEL
Anion Gap: 11 (ref 7–16)
BUN: 23 mg/dL — ABNORMAL HIGH (ref 7–18)
Chloride: 94 mmol/L — ABNORMAL LOW (ref 98–107)
Co2: 25 mmol/L (ref 21–32)
Creatinine: 1.36 mg/dL — ABNORMAL HIGH (ref 0.60–1.30)
EGFR (Non-African Amer.): 40 — ABNORMAL LOW
Osmolality: 274 (ref 275–301)
Sodium: 130 mmol/L — ABNORMAL LOW (ref 136–145)

## 2011-08-29 LAB — CBC CANCER CENTER
Basophil %: 0.5 %
Eosinophil #: 0 x10 3/mm (ref 0.0–0.7)
Eosinophil %: 0.3 %
HCT: 23.5 % — ABNORMAL LOW (ref 35.0–47.0)
HGB: 8.1 g/dL — ABNORMAL LOW (ref 12.0–16.0)
Lymphocyte #: 0.1 x10 3/mm — ABNORMAL LOW (ref 1.0–3.6)
Lymphocyte %: 22.1 %
MCHC: 34.7 g/dL (ref 32.0–36.0)
MCV: 94 fL (ref 80–100)
Monocyte %: 22.2 %
Neutrophil %: 54.9 %
RBC: 2.49 10*6/uL — ABNORMAL LOW (ref 3.80–5.20)
RDW: 25.5 % — ABNORMAL HIGH (ref 11.5–14.5)
WBC: 0.5 x10 3/mm — CL (ref 3.6–11.0)

## 2011-08-29 LAB — HEPATIC FUNCTION PANEL A (ARMC)
Albumin: 2.7 g/dL — ABNORMAL LOW (ref 3.4–5.0)
Bilirubin, Direct: 0.3 mg/dL — ABNORMAL HIGH (ref 0.00–0.20)
Bilirubin,Total: 0.9 mg/dL (ref 0.2–1.0)
SGPT (ALT): 21 U/L (ref 12–78)
Total Protein: 7 g/dL (ref 6.4–8.2)

## 2011-08-31 LAB — CBC CANCER CENTER
Bands: 14 %
Basophil: 1 %
Lymphocytes: 11 %
MCH: 32.4 pg (ref 26.0–34.0)
MCHC: 33.9 g/dL (ref 32.0–36.0)
Monocytes: 16 %
NRBC/100 WBC: 3 /100
Platelet: 115 x10 3/mm — ABNORMAL LOW (ref 150–440)
RBC: 2.49 10*6/uL — ABNORMAL LOW (ref 3.80–5.20)
RDW: 25.9 % — ABNORMAL HIGH (ref 11.5–14.5)
Segmented Neutrophils: 57 %

## 2011-08-31 LAB — BASIC METABOLIC PANEL
BUN: 28 mg/dL — ABNORMAL HIGH (ref 7–18)
Calcium, Total: 8.7 mg/dL (ref 8.5–10.1)
Chloride: 97 mmol/L — ABNORMAL LOW (ref 98–107)
Creatinine: 1.52 mg/dL — ABNORMAL HIGH (ref 0.60–1.30)
EGFR (Non-African Amer.): 35 — ABNORMAL LOW
Glucose: 148 mg/dL — ABNORMAL HIGH (ref 65–99)
Potassium: 3.9 mmol/L (ref 3.5–5.1)
Sodium: 133 mmol/L — ABNORMAL LOW (ref 136–145)

## 2011-09-04 ENCOUNTER — Ambulatory Visit: Payer: Self-pay | Admitting: Internal Medicine

## 2011-09-13 LAB — CBC CANCER CENTER
Basophil #: 0 x10 3/mm (ref 0.0–0.1)
Basophil %: 0.3 %
Eosinophil #: 0.1 x10 3/mm (ref 0.0–0.7)
HCT: 21 % — ABNORMAL LOW (ref 35.0–47.0)
HGB: 7.1 g/dL — ABNORMAL LOW (ref 12.0–16.0)
Lymphocyte #: 0.3 x10 3/mm — ABNORMAL LOW (ref 1.0–3.6)
MCH: 32.4 pg (ref 26.0–34.0)
MCHC: 33.7 g/dL (ref 32.0–36.0)
MCV: 96 fL (ref 80–100)
Monocyte #: 0.7 x10 3/mm (ref 0.2–0.9)
Neutrophil #: 2.8 x10 3/mm (ref 1.4–6.5)
RDW: 25 % — ABNORMAL HIGH (ref 11.5–14.5)

## 2011-09-13 LAB — HEPATIC FUNCTION PANEL A (ARMC)
Albumin: 2.2 g/dL — ABNORMAL LOW (ref 3.4–5.0)
Bilirubin, Direct: 0.4 mg/dL — ABNORMAL HIGH (ref 0.00–0.20)
SGOT(AST): 30 U/L (ref 15–37)
SGPT (ALT): 18 U/L (ref 12–78)
Total Protein: 6.8 g/dL (ref 6.4–8.2)

## 2011-09-13 LAB — BASIC METABOLIC PANEL
Anion Gap: 10 (ref 7–16)
Calcium, Total: 9 mg/dL (ref 8.5–10.1)
Co2: 23 mmol/L (ref 21–32)
EGFR (African American): 54 — ABNORMAL LOW
EGFR (Non-African Amer.): 47 — ABNORMAL LOW
Glucose: 145 mg/dL — ABNORMAL HIGH (ref 65–99)
Osmolality: 270 (ref 275–301)

## 2011-09-14 ENCOUNTER — Inpatient Hospital Stay: Payer: Self-pay | Admitting: Internal Medicine

## 2011-09-14 LAB — CBC CANCER CENTER
Basophil %: 0.3 %
Eosinophil #: 0.1 x10 3/mm (ref 0.0–0.7)
Eosinophil %: 1.2 %
HGB: 6.9 g/dL — ABNORMAL LOW (ref 12.0–16.0)
Lymphocyte #: 0.5 x10 3/mm — ABNORMAL LOW (ref 1.0–3.6)
Lymphocyte %: 10.9 %
MCH: 32.2 pg (ref 26.0–34.0)
MCHC: 33 g/dL (ref 32.0–36.0)
Monocyte #: 1 x10 3/mm — ABNORMAL HIGH (ref 0.2–0.9)
Neutrophil %: 66.4 %
Platelet: 100 x10 3/mm — ABNORMAL LOW (ref 150–440)
WBC: 4.7 x10 3/mm (ref 3.6–11.0)

## 2011-09-15 LAB — BASIC METABOLIC PANEL WITH GFR
Anion Gap: 12
BUN: 15 mg/dL
Calcium, Total: 8.8 mg/dL
Chloride: 100 mmol/L
Co2: 22 mmol/L
Creatinine: 0.89 mg/dL
EGFR (African American): >60
EGFR (Non-African Amer.): >60
Glucose: 106 mg/dL — ABNORMAL HIGH
Osmolality: 269
Potassium: 3.9 mmol/L
Sodium: 134 mmol/L — ABNORMAL LOW

## 2011-09-15 LAB — CBC WITH DIFFERENTIAL/PLATELET
Basophil #: 0 x10 3/mm 3
Basophil %: 0.2 %
Eosinophil #: 0.1 x10 3/mm 3
Eosinophil %: 1.6 %
HCT: 31.2 % — ABNORMAL LOW
HGB: 10.8 g/dL — ABNORMAL LOW
Lymphocyte %: 10.5 %
Lymphs Abs: 0.5 x10 3/mm 3 — ABNORMAL LOW
MCH: 32 pg
MCHC: 34.5 g/dL
MCV: 93 fL
Monocyte #: 0.9 "x10 3/mm "
Monocyte %: 18.5 %
Neutrophil #: 3.4 x10 3/mm 3
Neutrophil %: 69.2 %
Platelet: 97 x10 3/mm 3 — ABNORMAL LOW
RBC: 3.36 X10 6/mm 3 — ABNORMAL LOW
RDW: 20.7 % — ABNORMAL HIGH
WBC: 4.9 x10 3/mm 3

## 2011-09-17 ENCOUNTER — Ambulatory Visit: Payer: Self-pay | Admitting: Internal Medicine

## 2011-09-21 LAB — BASIC METABOLIC PANEL
Anion Gap: 8 (ref 7–16)
Calcium, Total: 8.6 mg/dL (ref 8.5–10.1)
Chloride: 101 mmol/L (ref 98–107)
Co2: 28 mmol/L (ref 21–32)
Creatinine: 1.13 mg/dL (ref 0.60–1.30)
EGFR (African American): 57 — ABNORMAL LOW
EGFR (Non-African Amer.): 50 — ABNORMAL LOW
Osmolality: 277 (ref 275–301)
Potassium: 3.8 mmol/L (ref 3.5–5.1)

## 2011-09-21 LAB — CBC CANCER CENTER
Basophil #: 0 x10 3/mm (ref 0.0–0.1)
Basophil %: 0.5 %
Eosinophil #: 0.1 x10 3/mm (ref 0.0–0.7)
HCT: 31.7 % — ABNORMAL LOW (ref 35.0–47.0)
Lymphocyte %: 18.2 %
MCHC: 33.3 g/dL (ref 32.0–36.0)
Monocyte #: 0.6 x10 3/mm (ref 0.2–0.9)
Neutrophil #: 3.3 x10 3/mm (ref 1.4–6.5)
Platelet: 96 x10 3/mm — ABNORMAL LOW (ref 150–440)
RBC: 3.34 10*6/uL — ABNORMAL LOW (ref 3.80–5.20)
RDW: 20.5 % — ABNORMAL HIGH (ref 11.5–14.5)

## 2011-10-04 ENCOUNTER — Ambulatory Visit: Payer: Self-pay | Admitting: Internal Medicine

## 2011-10-12 LAB — CBC CANCER CENTER
Basophil #: 0 x10 3/mm (ref 0.0–0.1)
Basophil %: 0.5 %
Eosinophil #: 0 x10 3/mm (ref 0.0–0.7)
Eosinophil %: 0.6 %
HCT: 30.1 % — ABNORMAL LOW (ref 35.0–47.0)
HGB: 10.2 g/dL — ABNORMAL LOW (ref 12.0–16.0)
Lymphocyte %: 20.4 %
MCH: 32.8 pg (ref 26.0–34.0)
MCHC: 33.9 g/dL (ref 32.0–36.0)
MCV: 97 fL (ref 80–100)
Monocyte #: 0.7 x10 3/mm (ref 0.2–0.9)
Monocyte %: 10.2 %
Neutrophil #: 4.5 x10 3/mm (ref 1.4–6.5)
Neutrophil %: 68.3 %
RBC: 3.1 10*6/uL — ABNORMAL LOW (ref 3.80–5.20)
RDW: 21.4 % — ABNORMAL HIGH (ref 11.5–14.5)

## 2011-10-12 LAB — CREATININE, SERUM
EGFR (African American): 56 — ABNORMAL LOW
EGFR (Non-African Amer.): 49 — ABNORMAL LOW

## 2011-11-04 ENCOUNTER — Ambulatory Visit: Payer: Self-pay | Admitting: Internal Medicine

## 2011-11-22 ENCOUNTER — Ambulatory Visit: Payer: Self-pay | Admitting: Internal Medicine

## 2011-11-22 LAB — BASIC METABOLIC PANEL
Anion Gap: 11 (ref 7–16)
BUN: 13 mg/dL (ref 7–18)
Chloride: 104 mmol/L (ref 98–107)
Creatinine: 1.06 mg/dL (ref 0.60–1.30)
EGFR (Non-African Amer.): 54 — ABNORMAL LOW
Glucose: 99 mg/dL (ref 65–99)
Osmolality: 278 (ref 275–301)
Potassium: 3.7 mmol/L (ref 3.5–5.1)
Sodium: 139 mmol/L (ref 136–145)

## 2011-11-22 LAB — CBC CANCER CENTER
Basophil #: 0 x10 3/mm (ref 0.0–0.1)
Basophil %: 0.4 %
Eosinophil #: 0.1 x10 3/mm (ref 0.0–0.7)
HGB: 9.5 g/dL — ABNORMAL LOW (ref 12.0–16.0)
Lymphocyte %: 19.5 %
MCHC: 33 g/dL (ref 32.0–36.0)
Monocyte %: 9.7 %
Neutrophil %: 68.8 %
RDW: 16.5 % — ABNORMAL HIGH (ref 11.5–14.5)
WBC: 5.5 x10 3/mm (ref 3.6–11.0)

## 2011-11-22 LAB — HEPATIC FUNCTION PANEL A (ARMC)
Albumin: 2.7 g/dL — ABNORMAL LOW (ref 3.4–5.0)
Alkaline Phosphatase: 80 U/L (ref 50–136)
Bilirubin, Direct: 0.1 mg/dL (ref 0.00–0.20)
Bilirubin,Total: 0.4 mg/dL (ref 0.2–1.0)
SGOT(AST): 17 U/L (ref 15–37)
SGPT (ALT): 11 U/L — ABNORMAL LOW (ref 12–78)
Total Protein: 7.1 g/dL (ref 6.4–8.2)

## 2011-12-04 ENCOUNTER — Ambulatory Visit: Payer: Self-pay | Admitting: Internal Medicine

## 2011-12-06 ENCOUNTER — Ambulatory Visit: Payer: Self-pay | Admitting: Internal Medicine

## 2011-12-06 LAB — BODY FLUID CELL COUNT WITH DIFFERENTIAL
Neutrophils: 5 %
Nucleated Cell Count: 776 /mm3
Other Cells BF: 6 %

## 2011-12-06 LAB — PROTIME-INR
INR: 1.1
Prothrombin Time: 14.1 secs (ref 11.5–14.7)

## 2011-12-06 LAB — GLUCOSE, SEROUS FLUID: Glucose, Body Fluid: 103 mg/dL

## 2011-12-06 LAB — APTT: Activated PTT: 34.3 secs (ref 23.6–35.9)

## 2011-12-06 LAB — PROTEIN, BODY FLUID: Protein, Body Fluid: 4 g/dL

## 2011-12-07 LAB — CBC CANCER CENTER
Basophil #: 0 x10 3/mm (ref 0.0–0.1)
Eosinophil %: 1.8 %
HGB: 10.6 g/dL — ABNORMAL LOW (ref 12.0–16.0)
Lymphocyte #: 0.8 x10 3/mm — ABNORMAL LOW (ref 1.0–3.6)
Lymphocyte %: 21.7 %
MCH: 35.3 pg — ABNORMAL HIGH (ref 26.0–34.0)
Monocyte #: 0.3 x10 3/mm (ref 0.2–0.9)
Monocyte %: 9.5 %
Neutrophil %: 66.4 %
Platelet: 197 x10 3/mm (ref 150–440)
RBC: 3.02 10*6/uL — ABNORMAL LOW (ref 3.80–5.20)
RDW: 14.9 % — ABNORMAL HIGH (ref 11.5–14.5)
WBC: 3.6 x10 3/mm (ref 3.6–11.0)

## 2011-12-07 LAB — HEPATIC FUNCTION PANEL A (ARMC)
Albumin: 2.8 g/dL — ABNORMAL LOW (ref 3.4–5.0)
Bilirubin, Direct: 0.1 mg/dL (ref 0.00–0.20)
Bilirubin,Total: 0.5 mg/dL (ref 0.2–1.0)
SGOT(AST): 16 U/L (ref 15–37)
Total Protein: 7.4 g/dL (ref 6.4–8.2)

## 2011-12-07 LAB — IRON AND TIBC
Iron Bind.Cap.(Total): 222 ug/dL — ABNORMAL LOW (ref 250–450)
Iron Saturation: 19 %

## 2011-12-07 LAB — BASIC METABOLIC PANEL
Anion Gap: 10 (ref 7–16)
BUN: 7 mg/dL (ref 7–18)
Co2: 25 mmol/L (ref 21–32)
EGFR (Non-African Amer.): 50 — ABNORMAL LOW
Glucose: 106 mg/dL — ABNORMAL HIGH (ref 65–99)
Osmolality: 278 (ref 275–301)

## 2012-01-03 LAB — BASIC METABOLIC PANEL
Anion Gap: 7 (ref 7–16)
Chloride: 107 mmol/L (ref 98–107)
Co2: 25 mmol/L (ref 21–32)
Creatinine: 1.21 mg/dL (ref 0.60–1.30)
Osmolality: 279 (ref 275–301)
Potassium: 3.9 mmol/L (ref 3.5–5.1)

## 2012-01-03 LAB — CBC CANCER CENTER
Basophil #: 0 x10 3/mm (ref 0.0–0.1)
Eosinophil #: 0.1 x10 3/mm (ref 0.0–0.7)
HCT: 28.8 % — ABNORMAL LOW (ref 35.0–47.0)
Lymphocyte #: 1 x10 3/mm (ref 1.0–3.6)
MCH: 34.3 pg — ABNORMAL HIGH (ref 26.0–34.0)
MCHC: 34.3 g/dL (ref 32.0–36.0)
MCV: 100 fL (ref 80–100)
Monocyte #: 0.5 x10 3/mm (ref 0.2–0.9)
Neutrophil %: 63.2 %
Platelet: 198 x10 3/mm (ref 150–440)
RDW: 13.9 % (ref 11.5–14.5)

## 2012-01-04 ENCOUNTER — Ambulatory Visit: Payer: Self-pay | Admitting: Internal Medicine

## 2012-01-11 ENCOUNTER — Ambulatory Visit: Payer: Self-pay

## 2012-01-11 LAB — CBC WITH DIFFERENTIAL/PLATELET
Basophil #: 0 10*3/uL (ref 0.0–0.1)
Basophil %: 0.3 %
Eosinophil #: 0 10*3/uL (ref 0.0–0.7)
HCT: 29.3 % — ABNORMAL LOW (ref 35.0–47.0)
Lymphocyte #: 1 10*3/uL (ref 1.0–3.6)
Lymphocyte %: 23.4 %
MCH: 35 pg — ABNORMAL HIGH (ref 26.0–34.0)
MCHC: 35.1 g/dL (ref 32.0–36.0)
Monocyte %: 10.7 %
Neutrophil #: 2.8 10*3/uL (ref 1.4–6.5)
RBC: 2.94 10*6/uL — ABNORMAL LOW (ref 3.80–5.20)
WBC: 4.3 10*3/uL (ref 3.6–11.0)

## 2012-01-11 LAB — COMPREHENSIVE METABOLIC PANEL
Albumin: 2.9 g/dL — ABNORMAL LOW (ref 3.4–5.0)
Anion Gap: 7 (ref 7–16)
Bilirubin,Total: 0.5 mg/dL (ref 0.2–1.0)
Calcium, Total: 8.8 mg/dL (ref 8.5–10.1)
Chloride: 108 mmol/L — ABNORMAL HIGH (ref 98–107)
Creatinine: 1.32 mg/dL — ABNORMAL HIGH (ref 0.60–1.30)
EGFR (African American): 48 — ABNORMAL LOW
Glucose: 93 mg/dL (ref 65–99)
Osmolality: 282 (ref 275–301)
SGOT(AST): 13 U/L — ABNORMAL LOW (ref 15–37)
SGPT (ALT): 10 U/L — ABNORMAL LOW (ref 12–78)
Sodium: 141 mmol/L (ref 136–145)

## 2012-01-11 LAB — APTT: Activated PTT: 33.2 secs (ref 23.6–35.9)

## 2012-01-18 ENCOUNTER — Inpatient Hospital Stay: Payer: Self-pay

## 2012-01-19 LAB — CBC WITH DIFFERENTIAL/PLATELET
Basophil #: 0 10*3/uL (ref 0.0–0.1)
Basophil %: 0.1 %
Eosinophil %: 0 %
Lymphocyte #: 0.6 10*3/uL — ABNORMAL LOW (ref 1.0–3.6)
Monocyte #: 0.4 x10 3/mm (ref 0.2–0.9)
Monocyte %: 4.9 %
Neutrophil #: 6.2 10*3/uL (ref 1.4–6.5)
Platelet: 186 10*3/uL (ref 150–440)
RBC: 2.86 10*6/uL — ABNORMAL LOW (ref 3.80–5.20)
WBC: 7.2 10*3/uL (ref 3.6–11.0)

## 2012-01-20 LAB — PATHOLOGY REPORT

## 2012-01-30 LAB — BASIC METABOLIC PANEL
Anion Gap: 11 (ref 7–16)
Calcium, Total: 8.6 mg/dL (ref 8.5–10.1)
Chloride: 96 mmol/L — ABNORMAL LOW (ref 98–107)
Creatinine: 1.21 mg/dL (ref 0.60–1.30)
EGFR (African American): 53 — ABNORMAL LOW
Glucose: 112 mg/dL — ABNORMAL HIGH (ref 65–99)
Osmolality: 267 (ref 275–301)
Potassium: 3.8 mmol/L (ref 3.5–5.1)

## 2012-01-30 LAB — CBC CANCER CENTER
Basophil #: 0 x10 3/mm (ref 0.0–0.1)
Basophil %: 0.8 %
Eosinophil #: 0 x10 3/mm (ref 0.0–0.7)
Eosinophil %: 0.6 %
HCT: 28.6 % — ABNORMAL LOW (ref 35.0–47.0)
Lymphocyte #: 0.3 x10 3/mm — ABNORMAL LOW (ref 1.0–3.6)
MCHC: 33.5 g/dL (ref 32.0–36.0)
MCV: 96 fL (ref 80–100)
Monocyte #: 0.4 x10 3/mm (ref 0.2–0.9)
Monocyte %: 11.3 %
Platelet: 194 x10 3/mm (ref 150–440)
RDW: 14.4 % (ref 11.5–14.5)

## 2012-02-04 ENCOUNTER — Ambulatory Visit: Payer: Self-pay | Admitting: Internal Medicine

## 2012-02-09 LAB — CBC CANCER CENTER
Basophil %: 0.4 %
Eosinophil #: 0 x10 3/mm (ref 0.0–0.7)
Eosinophil %: 0.4 %
HCT: 30.1 % — ABNORMAL LOW (ref 35.0–47.0)
Lymphocyte #: 0.8 x10 3/mm — ABNORMAL LOW (ref 1.0–3.6)
MCHC: 33.3 g/dL (ref 32.0–36.0)
Monocyte #: 0.4 x10 3/mm (ref 0.2–0.9)
Monocyte %: 10.5 %
Neutrophil #: 2.6 x10 3/mm (ref 1.4–6.5)
RBC: 3.14 10*6/uL — ABNORMAL LOW (ref 3.80–5.20)
RDW: 14.7 % — ABNORMAL HIGH (ref 11.5–14.5)

## 2012-03-03 ENCOUNTER — Ambulatory Visit: Payer: Self-pay | Admitting: Internal Medicine

## 2012-03-26 LAB — CBC CANCER CENTER
Basophil %: 0.9 %
HCT: 23.2 % — ABNORMAL LOW (ref 35.0–47.0)
HGB: 7.7 g/dL — ABNORMAL LOW (ref 12.0–16.0)
Lymphocyte #: 0.7 x10 3/mm — ABNORMAL LOW (ref 1.0–3.6)
MCH: 30.8 pg (ref 26.0–34.0)
MCHC: 33.3 g/dL (ref 32.0–36.0)
Monocyte %: 10.8 %
Neutrophil %: 69.6 %
RBC: 2.52 10*6/uL — ABNORMAL LOW (ref 3.80–5.20)
RDW: 16.9 % — ABNORMAL HIGH (ref 11.5–14.5)

## 2012-03-26 LAB — BASIC METABOLIC PANEL
Anion Gap: 9 (ref 7–16)
BUN: 25 mg/dL — ABNORMAL HIGH (ref 7–18)
Calcium, Total: 8.9 mg/dL (ref 8.5–10.1)
Chloride: 103 mmol/L (ref 98–107)
EGFR (Non-African Amer.): 43 — ABNORMAL LOW
Osmolality: 279 (ref 275–301)
Potassium: 3.8 mmol/L (ref 3.5–5.1)
Sodium: 137 mmol/L (ref 136–145)

## 2012-03-26 LAB — HEPATIC FUNCTION PANEL A (ARMC)
Bilirubin,Total: 0.4 mg/dL (ref 0.2–1.0)
SGPT (ALT): 9 U/L — ABNORMAL LOW (ref 12–78)
Total Protein: 7.4 g/dL (ref 6.4–8.2)

## 2012-03-26 LAB — FERRITIN: Ferritin (ARMC): 155 ng/mL (ref 8–388)

## 2012-03-29 ENCOUNTER — Ambulatory Visit: Payer: Self-pay | Admitting: Internal Medicine

## 2012-04-03 ENCOUNTER — Ambulatory Visit: Payer: Self-pay | Admitting: Internal Medicine

## 2012-04-10 LAB — BASIC METABOLIC PANEL
Anion Gap: 12 (ref 7–16)
Chloride: 102 mmol/L (ref 98–107)
Co2: 22 mmol/L (ref 21–32)
Creatinine: 1.53 mg/dL — ABNORMAL HIGH (ref 0.60–1.30)
EGFR (African American): 40 — ABNORMAL LOW
EGFR (Non-African Amer.): 34 — ABNORMAL LOW
Osmolality: 277 (ref 275–301)
Potassium: 4.1 mmol/L (ref 3.5–5.1)
Sodium: 136 mmol/L (ref 136–145)

## 2012-04-10 LAB — CBC CANCER CENTER
Basophil %: 0.5 %
Eosinophil #: 0.1 x10 3/mm (ref 0.0–0.7)
HCT: 25 % — ABNORMAL LOW (ref 35.0–47.0)
HGB: 8.1 g/dL — ABNORMAL LOW (ref 12.0–16.0)
Lymphocyte #: 0.8 x10 3/mm — ABNORMAL LOW (ref 1.0–3.6)
Lymphocyte %: 15.7 %
MCH: 29.7 pg (ref 26.0–34.0)
MCHC: 32.4 g/dL (ref 32.0–36.0)
Monocyte #: 0.6 x10 3/mm (ref 0.2–0.9)
Monocyte %: 11 %
Neutrophil #: 3.6 x10 3/mm (ref 1.4–6.5)
RBC: 2.73 10*6/uL — ABNORMAL LOW (ref 3.80–5.20)
RDW: 17.4 % — ABNORMAL HIGH (ref 11.5–14.5)

## 2012-04-17 LAB — BASIC METABOLIC PANEL
BUN: 30 mg/dL — ABNORMAL HIGH (ref 7–18)
Calcium, Total: 8.9 mg/dL (ref 8.5–10.1)
Chloride: 102 mmol/L (ref 98–107)
Co2: 22 mmol/L (ref 21–32)
EGFR (African American): 46 — ABNORMAL LOW
EGFR (Non-African Amer.): 40 — ABNORMAL LOW
Osmolality: 286 (ref 275–301)
Potassium: 3.8 mmol/L (ref 3.5–5.1)
Sodium: 136 mmol/L (ref 136–145)

## 2012-04-17 LAB — CBC CANCER CENTER
Basophil #: 0 x10 3/mm (ref 0.0–0.1)
Eosinophil #: 0 x10 3/mm (ref 0.0–0.7)
Eosinophil %: 0.2 %
Lymphocyte #: 0.7 x10 3/mm — ABNORMAL LOW (ref 1.0–3.6)
Lymphocyte %: 9.9 %
MCH: 28.5 pg (ref 26.0–34.0)
MCHC: 31.6 g/dL — ABNORMAL LOW (ref 32.0–36.0)
Monocyte #: 0.7 x10 3/mm (ref 0.2–0.9)
Monocyte %: 9.9 %
Neutrophil #: 5.8 x10 3/mm (ref 1.4–6.5)
Platelet: 267 x10 3/mm (ref 150–440)
RBC: 2.72 10*6/uL — ABNORMAL LOW (ref 3.80–5.20)
WBC: 7.3 x10 3/mm (ref 3.6–11.0)

## 2012-05-03 ENCOUNTER — Ambulatory Visit: Payer: Self-pay | Admitting: Internal Medicine

## 2012-05-08 LAB — CBC CANCER CENTER
Basophil #: 0 x10 3/mm (ref 0.0–0.1)
Basophil %: 0.4 %
Eosinophil #: 0.1 x10 3/mm (ref 0.0–0.7)
Eosinophil %: 1 %
HCT: 21.7 % — ABNORMAL LOW (ref 35.0–47.0)
Lymphocyte #: 0.6 x10 3/mm — ABNORMAL LOW (ref 1.0–3.6)
Lymphocyte %: 10.2 %
MCH: 27.4 pg (ref 26.0–34.0)
MCHC: 31.8 g/dL — ABNORMAL LOW (ref 32.0–36.0)
MCV: 86 fL (ref 80–100)
Monocyte #: 0.4 x10 3/mm (ref 0.2–0.9)
Monocyte %: 7.3 %
WBC: 5.9 x10 3/mm (ref 3.6–11.0)

## 2012-05-08 LAB — BASIC METABOLIC PANEL
Anion Gap: 16 (ref 7–16)
BUN: 32 mg/dL — ABNORMAL HIGH (ref 7–18)
Calcium, Total: 9.2 mg/dL (ref 8.5–10.1)
Chloride: 101 mmol/L (ref 98–107)
Co2: 16 mmol/L — ABNORMAL LOW (ref 21–32)
Creatinine: 1.49 mg/dL — ABNORMAL HIGH (ref 0.60–1.30)
EGFR (African American): 41 — ABNORMAL LOW
EGFR (Non-African Amer.): 35 — ABNORMAL LOW
Glucose: 185 mg/dL — ABNORMAL HIGH (ref 65–99)

## 2012-05-08 LAB — HEPATIC FUNCTION PANEL A (ARMC)
Albumin: 2.1 g/dL — ABNORMAL LOW (ref 3.4–5.0)
Alkaline Phosphatase: 105 U/L (ref 50–136)
Bilirubin, Direct: 0.2 mg/dL (ref 0.00–0.20)
SGOT(AST): 19 U/L (ref 15–37)
Total Protein: 8.4 g/dL — ABNORMAL HIGH (ref 6.4–8.2)

## 2012-05-29 LAB — MAGNESIUM: Magnesium: 1.3 mg/dL — ABNORMAL LOW

## 2012-05-29 LAB — CBC CANCER CENTER
Basophil #: 0 x10 3/mm (ref 0.0–0.1)
Basophil %: 0.3 %
Eosinophil #: 0.1 x10 3/mm (ref 0.0–0.7)
Eosinophil %: 1.2 %
Lymphocyte %: 3.8 %
MCH: 26.9 pg (ref 26.0–34.0)
MCHC: 32.7 g/dL (ref 32.0–36.0)
MCV: 83 fL (ref 80–100)
Monocyte #: 0.5 x10 3/mm (ref 0.2–0.9)
Neutrophil #: 7 x10 3/mm — ABNORMAL HIGH (ref 1.4–6.5)
Neutrophil %: 88.4 %
WBC: 7.9 x10 3/mm (ref 3.6–11.0)

## 2012-05-29 LAB — BASIC METABOLIC PANEL
Anion Gap: 14 (ref 7–16)
BUN: 30 mg/dL — ABNORMAL HIGH (ref 7–18)
Calcium, Total: 9.6 mg/dL (ref 8.5–10.1)
Chloride: 100 mmol/L (ref 98–107)
EGFR (Non-African Amer.): 32 — ABNORMAL LOW
Glucose: 165 mg/dL — ABNORMAL HIGH (ref 65–99)
Potassium: 4 mmol/L (ref 3.5–5.1)
Sodium: 133 mmol/L — ABNORMAL LOW (ref 136–145)

## 2012-05-29 LAB — HEPATIC FUNCTION PANEL A (ARMC)
Albumin: 2.2 g/dL — ABNORMAL LOW (ref 3.4–5.0)
Bilirubin, Direct: 0.4 mg/dL — ABNORMAL HIGH (ref 0.00–0.20)
Bilirubin,Total: 1.6 mg/dL — ABNORMAL HIGH (ref 0.2–1.0)
SGOT(AST): 27 U/L (ref 15–37)
SGPT (ALT): 36 U/L (ref 12–78)
Total Protein: 8.1 g/dL (ref 6.4–8.2)

## 2012-05-29 LAB — PHOSPHORUS: Phosphorus: 3.2 mg/dL (ref 2.5–4.9)

## 2012-06-03 ENCOUNTER — Ambulatory Visit: Payer: Self-pay | Admitting: Internal Medicine

## 2012-06-19 LAB — HEPATIC FUNCTION PANEL A (ARMC)
Albumin: 1.9 g/dL — ABNORMAL LOW (ref 3.4–5.0)
Alkaline Phosphatase: 74 U/L (ref 50–136)
Bilirubin, Direct: 0.2 mg/dL (ref 0.00–0.20)

## 2012-06-19 LAB — BASIC METABOLIC PANEL
BUN: 20 mg/dL — ABNORMAL HIGH (ref 7–18)
Calcium, Total: 8.8 mg/dL (ref 8.5–10.1)
Chloride: 101 mmol/L (ref 98–107)
Co2: 23 mmol/L (ref 21–32)
Potassium: 3.9 mmol/L (ref 3.5–5.1)

## 2012-06-19 LAB — CBC CANCER CENTER
Basophil %: 0.3 %
Eosinophil #: 0 x10 3/mm (ref 0.0–0.7)
Eosinophil %: 0.2 %
HGB: 7.2 g/dL — ABNORMAL LOW (ref 12.0–16.0)
Lymphocyte #: 0.8 x10 3/mm — ABNORMAL LOW (ref 1.0–3.6)
Lymphocyte %: 11.5 %
MCH: 27.3 pg (ref 26.0–34.0)
MCV: 84 fL (ref 80–100)
Monocyte %: 8.7 %
Platelet: 298 x10 3/mm (ref 150–440)
RBC: 2.65 10*6/uL — ABNORMAL LOW (ref 3.80–5.20)
RDW: 20.7 % — ABNORMAL HIGH (ref 11.5–14.5)

## 2012-07-03 ENCOUNTER — Ambulatory Visit: Payer: Self-pay | Admitting: Internal Medicine

## 2012-07-10 LAB — BASIC METABOLIC PANEL
Co2: 25 mmol/L (ref 21–32)
Creatinine: 1.33 mg/dL — ABNORMAL HIGH (ref 0.60–1.30)
Glucose: 144 mg/dL — ABNORMAL HIGH (ref 65–99)
Osmolality: 272 (ref 275–301)

## 2012-07-10 LAB — HEPATIC FUNCTION PANEL A (ARMC)
Albumin: 2.2 g/dL — ABNORMAL LOW (ref 3.4–5.0)
Alkaline Phosphatase: 97 U/L (ref 50–136)
Bilirubin, Direct: 0.2 mg/dL (ref 0.00–0.20)
Bilirubin,Total: 0.6 mg/dL (ref 0.2–1.0)
SGPT (ALT): 16 U/L (ref 12–78)
Total Protein: 8.2 g/dL (ref 6.4–8.2)

## 2012-07-10 LAB — CBC CANCER CENTER
Basophil #: 0 x10 3/mm (ref 0.0–0.1)
HCT: 25.7 % — ABNORMAL LOW (ref 35.0–47.0)
Lymphocyte %: 9.9 %
MCH: 30.2 pg (ref 26.0–34.0)
MCHC: 34.1 g/dL (ref 32.0–36.0)
MCV: 88 fL (ref 80–100)
Monocyte %: 11.2 %
RBC: 2.9 10*6/uL — ABNORMAL LOW (ref 3.80–5.20)

## 2012-07-10 LAB — MAGNESIUM: Magnesium: 2.2 mg/dL

## 2012-07-11 DIAGNOSIS — D509 Iron deficiency anemia, unspecified: Secondary | ICD-10-CM | POA: Diagnosis not present

## 2012-07-11 DIAGNOSIS — J449 Chronic obstructive pulmonary disease, unspecified: Secondary | ICD-10-CM | POA: Diagnosis not present

## 2012-07-11 DIAGNOSIS — C3410 Malignant neoplasm of upper lobe, unspecified bronchus or lung: Secondary | ICD-10-CM | POA: Diagnosis not present

## 2012-07-12 DIAGNOSIS — C3410 Malignant neoplasm of upper lobe, unspecified bronchus or lung: Secondary | ICD-10-CM | POA: Diagnosis not present

## 2012-07-12 DIAGNOSIS — J449 Chronic obstructive pulmonary disease, unspecified: Secondary | ICD-10-CM | POA: Diagnosis not present

## 2012-07-12 DIAGNOSIS — D509 Iron deficiency anemia, unspecified: Secondary | ICD-10-CM | POA: Diagnosis not present

## 2012-07-13 DIAGNOSIS — D509 Iron deficiency anemia, unspecified: Secondary | ICD-10-CM | POA: Diagnosis not present

## 2012-07-13 DIAGNOSIS — C3410 Malignant neoplasm of upper lobe, unspecified bronchus or lung: Secondary | ICD-10-CM | POA: Diagnosis not present

## 2012-07-13 DIAGNOSIS — J449 Chronic obstructive pulmonary disease, unspecified: Secondary | ICD-10-CM | POA: Diagnosis not present

## 2012-07-16 DIAGNOSIS — D509 Iron deficiency anemia, unspecified: Secondary | ICD-10-CM | POA: Diagnosis not present

## 2012-07-16 DIAGNOSIS — C3410 Malignant neoplasm of upper lobe, unspecified bronchus or lung: Secondary | ICD-10-CM | POA: Diagnosis not present

## 2012-07-16 DIAGNOSIS — J449 Chronic obstructive pulmonary disease, unspecified: Secondary | ICD-10-CM | POA: Diagnosis not present

## 2012-07-18 DIAGNOSIS — C3410 Malignant neoplasm of upper lobe, unspecified bronchus or lung: Secondary | ICD-10-CM | POA: Diagnosis not present

## 2012-07-18 DIAGNOSIS — J449 Chronic obstructive pulmonary disease, unspecified: Secondary | ICD-10-CM | POA: Diagnosis not present

## 2012-07-18 DIAGNOSIS — D509 Iron deficiency anemia, unspecified: Secondary | ICD-10-CM | POA: Diagnosis not present

## 2012-07-20 DIAGNOSIS — J449 Chronic obstructive pulmonary disease, unspecified: Secondary | ICD-10-CM | POA: Diagnosis not present

## 2012-07-20 DIAGNOSIS — C3410 Malignant neoplasm of upper lobe, unspecified bronchus or lung: Secondary | ICD-10-CM | POA: Diagnosis not present

## 2012-07-20 DIAGNOSIS — D509 Iron deficiency anemia, unspecified: Secondary | ICD-10-CM | POA: Diagnosis not present

## 2012-07-27 DIAGNOSIS — D509 Iron deficiency anemia, unspecified: Secondary | ICD-10-CM | POA: Diagnosis not present

## 2012-07-27 DIAGNOSIS — J449 Chronic obstructive pulmonary disease, unspecified: Secondary | ICD-10-CM | POA: Diagnosis not present

## 2012-07-27 DIAGNOSIS — C3410 Malignant neoplasm of upper lobe, unspecified bronchus or lung: Secondary | ICD-10-CM | POA: Diagnosis not present

## 2012-08-03 ENCOUNTER — Ambulatory Visit: Payer: Self-pay | Admitting: Internal Medicine

## 2012-08-03 DIAGNOSIS — J449 Chronic obstructive pulmonary disease, unspecified: Secondary | ICD-10-CM | POA: Diagnosis not present

## 2012-08-03 DIAGNOSIS — D509 Iron deficiency anemia, unspecified: Secondary | ICD-10-CM | POA: Diagnosis not present

## 2012-08-03 DIAGNOSIS — C3410 Malignant neoplasm of upper lobe, unspecified bronchus or lung: Secondary | ICD-10-CM | POA: Diagnosis not present

## 2012-08-14 DIAGNOSIS — C3410 Malignant neoplasm of upper lobe, unspecified bronchus or lung: Secondary | ICD-10-CM | POA: Diagnosis not present

## 2012-08-14 DIAGNOSIS — D509 Iron deficiency anemia, unspecified: Secondary | ICD-10-CM | POA: Diagnosis not present

## 2012-08-14 DIAGNOSIS — Z79899 Other long term (current) drug therapy: Secondary | ICD-10-CM | POA: Diagnosis not present

## 2012-08-14 LAB — CBC CANCER CENTER
Eosinophil #: 0 x10 3/mm (ref 0.0–0.7)
Eosinophil %: 0.2 %
MCV: 98 fL (ref 80–100)
Monocyte #: 0.3 x10 3/mm (ref 0.2–0.9)
Monocyte %: 4.4 %
Neutrophil #: 6.3 x10 3/mm (ref 1.4–6.5)
RDW: 22.4 % — ABNORMAL HIGH (ref 11.5–14.5)

## 2012-08-14 LAB — BASIC METABOLIC PANEL
Anion Gap: 10 (ref 7–16)
BUN: 29 mg/dL — ABNORMAL HIGH (ref 7–18)
Calcium, Total: 8.6 mg/dL (ref 8.5–10.1)
Co2: 26 mmol/L (ref 21–32)
Creatinine: 1.34 mg/dL — ABNORMAL HIGH (ref 0.60–1.30)
EGFR (Non-African Amer.): 40 — ABNORMAL LOW
Glucose: 164 mg/dL — ABNORMAL HIGH (ref 65–99)
Osmolality: 289 (ref 275–301)

## 2012-09-03 ENCOUNTER — Ambulatory Visit: Payer: Self-pay | Admitting: Internal Medicine

## 2012-09-03 DIAGNOSIS — J449 Chronic obstructive pulmonary disease, unspecified: Secondary | ICD-10-CM | POA: Diagnosis not present

## 2012-09-03 DIAGNOSIS — D509 Iron deficiency anemia, unspecified: Secondary | ICD-10-CM | POA: Diagnosis not present

## 2012-09-03 DIAGNOSIS — C3410 Malignant neoplasm of upper lobe, unspecified bronchus or lung: Secondary | ICD-10-CM | POA: Diagnosis not present

## 2012-10-02 DIAGNOSIS — Z23 Encounter for immunization: Secondary | ICD-10-CM | POA: Diagnosis not present

## 2012-10-03 DIAGNOSIS — J449 Chronic obstructive pulmonary disease, unspecified: Secondary | ICD-10-CM | POA: Diagnosis not present

## 2012-10-03 DIAGNOSIS — C3410 Malignant neoplasm of upper lobe, unspecified bronchus or lung: Secondary | ICD-10-CM | POA: Diagnosis not present

## 2012-10-03 DIAGNOSIS — D509 Iron deficiency anemia, unspecified: Secondary | ICD-10-CM | POA: Diagnosis not present

## 2012-10-29 DIAGNOSIS — I10 Essential (primary) hypertension: Secondary | ICD-10-CM | POA: Diagnosis not present

## 2012-10-29 DIAGNOSIS — K219 Gastro-esophageal reflux disease without esophagitis: Secondary | ICD-10-CM | POA: Diagnosis not present

## 2012-10-29 DIAGNOSIS — D649 Anemia, unspecified: Secondary | ICD-10-CM | POA: Diagnosis not present

## 2012-11-03 DIAGNOSIS — C3410 Malignant neoplasm of upper lobe, unspecified bronchus or lung: Secondary | ICD-10-CM | POA: Diagnosis not present

## 2012-11-03 DIAGNOSIS — D509 Iron deficiency anemia, unspecified: Secondary | ICD-10-CM | POA: Diagnosis not present

## 2012-11-03 DIAGNOSIS — J449 Chronic obstructive pulmonary disease, unspecified: Secondary | ICD-10-CM | POA: Diagnosis not present

## 2012-11-04 DIAGNOSIS — D509 Iron deficiency anemia, unspecified: Secondary | ICD-10-CM | POA: Diagnosis not present

## 2012-11-04 DIAGNOSIS — J449 Chronic obstructive pulmonary disease, unspecified: Secondary | ICD-10-CM | POA: Diagnosis not present

## 2012-11-04 DIAGNOSIS — C3410 Malignant neoplasm of upper lobe, unspecified bronchus or lung: Secondary | ICD-10-CM | POA: Diagnosis not present

## 2012-11-09 DIAGNOSIS — C3410 Malignant neoplasm of upper lobe, unspecified bronchus or lung: Secondary | ICD-10-CM | POA: Diagnosis not present

## 2012-11-09 DIAGNOSIS — J449 Chronic obstructive pulmonary disease, unspecified: Secondary | ICD-10-CM | POA: Diagnosis not present

## 2012-11-09 DIAGNOSIS — D509 Iron deficiency anemia, unspecified: Secondary | ICD-10-CM | POA: Diagnosis not present

## 2012-11-16 DIAGNOSIS — D509 Iron deficiency anemia, unspecified: Secondary | ICD-10-CM | POA: Diagnosis not present

## 2012-11-16 DIAGNOSIS — J449 Chronic obstructive pulmonary disease, unspecified: Secondary | ICD-10-CM | POA: Diagnosis not present

## 2012-11-16 DIAGNOSIS — C3410 Malignant neoplasm of upper lobe, unspecified bronchus or lung: Secondary | ICD-10-CM | POA: Diagnosis not present

## 2012-11-21 DIAGNOSIS — D509 Iron deficiency anemia, unspecified: Secondary | ICD-10-CM | POA: Diagnosis not present

## 2012-11-21 DIAGNOSIS — C3410 Malignant neoplasm of upper lobe, unspecified bronchus or lung: Secondary | ICD-10-CM | POA: Diagnosis not present

## 2012-11-21 DIAGNOSIS — J449 Chronic obstructive pulmonary disease, unspecified: Secondary | ICD-10-CM | POA: Diagnosis not present

## 2012-11-23 DIAGNOSIS — D509 Iron deficiency anemia, unspecified: Secondary | ICD-10-CM | POA: Diagnosis not present

## 2012-11-23 DIAGNOSIS — N2581 Secondary hyperparathyroidism of renal origin: Secondary | ICD-10-CM | POA: Diagnosis not present

## 2012-11-23 DIAGNOSIS — J449 Chronic obstructive pulmonary disease, unspecified: Secondary | ICD-10-CM | POA: Diagnosis not present

## 2012-11-23 DIAGNOSIS — I10 Essential (primary) hypertension: Secondary | ICD-10-CM | POA: Diagnosis not present

## 2012-11-23 DIAGNOSIS — C3410 Malignant neoplasm of upper lobe, unspecified bronchus or lung: Secondary | ICD-10-CM | POA: Diagnosis not present

## 2012-11-23 DIAGNOSIS — D631 Anemia in chronic kidney disease: Secondary | ICD-10-CM | POA: Diagnosis not present

## 2012-11-28 DIAGNOSIS — C3410 Malignant neoplasm of upper lobe, unspecified bronchus or lung: Secondary | ICD-10-CM | POA: Diagnosis not present

## 2012-11-28 DIAGNOSIS — J449 Chronic obstructive pulmonary disease, unspecified: Secondary | ICD-10-CM | POA: Diagnosis not present

## 2012-11-28 DIAGNOSIS — D509 Iron deficiency anemia, unspecified: Secondary | ICD-10-CM | POA: Diagnosis not present

## 2012-12-03 DIAGNOSIS — D509 Iron deficiency anemia, unspecified: Secondary | ICD-10-CM | POA: Diagnosis not present

## 2012-12-03 DIAGNOSIS — J449 Chronic obstructive pulmonary disease, unspecified: Secondary | ICD-10-CM | POA: Diagnosis not present

## 2012-12-03 DIAGNOSIS — C3410 Malignant neoplasm of upper lobe, unspecified bronchus or lung: Secondary | ICD-10-CM | POA: Diagnosis not present

## 2012-12-06 DIAGNOSIS — J449 Chronic obstructive pulmonary disease, unspecified: Secondary | ICD-10-CM | POA: Diagnosis not present

## 2012-12-06 DIAGNOSIS — D509 Iron deficiency anemia, unspecified: Secondary | ICD-10-CM | POA: Diagnosis not present

## 2012-12-06 DIAGNOSIS — C3410 Malignant neoplasm of upper lobe, unspecified bronchus or lung: Secondary | ICD-10-CM | POA: Diagnosis not present

## 2012-12-07 DIAGNOSIS — D509 Iron deficiency anemia, unspecified: Secondary | ICD-10-CM | POA: Diagnosis not present

## 2012-12-07 DIAGNOSIS — C3410 Malignant neoplasm of upper lobe, unspecified bronchus or lung: Secondary | ICD-10-CM | POA: Diagnosis not present

## 2012-12-07 DIAGNOSIS — J449 Chronic obstructive pulmonary disease, unspecified: Secondary | ICD-10-CM | POA: Diagnosis not present

## 2012-12-08 DIAGNOSIS — C3410 Malignant neoplasm of upper lobe, unspecified bronchus or lung: Secondary | ICD-10-CM | POA: Diagnosis not present

## 2012-12-08 DIAGNOSIS — D509 Iron deficiency anemia, unspecified: Secondary | ICD-10-CM | POA: Diagnosis not present

## 2012-12-08 DIAGNOSIS — J449 Chronic obstructive pulmonary disease, unspecified: Secondary | ICD-10-CM | POA: Diagnosis not present

## 2012-12-11 ENCOUNTER — Ambulatory Visit: Payer: Self-pay | Admitting: Nephrology

## 2012-12-11 DIAGNOSIS — J449 Chronic obstructive pulmonary disease, unspecified: Secondary | ICD-10-CM | POA: Diagnosis not present

## 2012-12-11 DIAGNOSIS — D509 Iron deficiency anemia, unspecified: Secondary | ICD-10-CM | POA: Diagnosis not present

## 2012-12-11 DIAGNOSIS — C3410 Malignant neoplasm of upper lobe, unspecified bronchus or lung: Secondary | ICD-10-CM | POA: Diagnosis not present

## 2012-12-13 DIAGNOSIS — J449 Chronic obstructive pulmonary disease, unspecified: Secondary | ICD-10-CM | POA: Diagnosis not present

## 2012-12-13 DIAGNOSIS — C3410 Malignant neoplasm of upper lobe, unspecified bronchus or lung: Secondary | ICD-10-CM | POA: Diagnosis not present

## 2012-12-13 DIAGNOSIS — D509 Iron deficiency anemia, unspecified: Secondary | ICD-10-CM | POA: Diagnosis not present

## 2012-12-14 DIAGNOSIS — J449 Chronic obstructive pulmonary disease, unspecified: Secondary | ICD-10-CM | POA: Diagnosis not present

## 2012-12-14 DIAGNOSIS — C3410 Malignant neoplasm of upper lobe, unspecified bronchus or lung: Secondary | ICD-10-CM | POA: Diagnosis not present

## 2012-12-14 DIAGNOSIS — D509 Iron deficiency anemia, unspecified: Secondary | ICD-10-CM | POA: Diagnosis not present

## 2012-12-21 DIAGNOSIS — D509 Iron deficiency anemia, unspecified: Secondary | ICD-10-CM | POA: Diagnosis not present

## 2012-12-21 DIAGNOSIS — J449 Chronic obstructive pulmonary disease, unspecified: Secondary | ICD-10-CM | POA: Diagnosis not present

## 2012-12-21 DIAGNOSIS — C3410 Malignant neoplasm of upper lobe, unspecified bronchus or lung: Secondary | ICD-10-CM | POA: Diagnosis not present

## 2012-12-28 DIAGNOSIS — D509 Iron deficiency anemia, unspecified: Secondary | ICD-10-CM | POA: Diagnosis not present

## 2012-12-28 DIAGNOSIS — C3410 Malignant neoplasm of upper lobe, unspecified bronchus or lung: Secondary | ICD-10-CM | POA: Diagnosis not present

## 2012-12-28 DIAGNOSIS — J449 Chronic obstructive pulmonary disease, unspecified: Secondary | ICD-10-CM | POA: Diagnosis not present

## 2012-12-29 DIAGNOSIS — C3410 Malignant neoplasm of upper lobe, unspecified bronchus or lung: Secondary | ICD-10-CM | POA: Diagnosis not present

## 2012-12-29 DIAGNOSIS — D509 Iron deficiency anemia, unspecified: Secondary | ICD-10-CM | POA: Diagnosis not present

## 2012-12-29 DIAGNOSIS — J449 Chronic obstructive pulmonary disease, unspecified: Secondary | ICD-10-CM | POA: Diagnosis not present

## 2013-01-03 DIAGNOSIS — C3410 Malignant neoplasm of upper lobe, unspecified bronchus or lung: Secondary | ICD-10-CM | POA: Diagnosis not present

## 2013-01-03 DIAGNOSIS — D509 Iron deficiency anemia, unspecified: Secondary | ICD-10-CM | POA: Diagnosis not present

## 2013-01-03 DIAGNOSIS — J449 Chronic obstructive pulmonary disease, unspecified: Secondary | ICD-10-CM | POA: Diagnosis not present

## 2013-02-01 DIAGNOSIS — N183 Chronic kidney disease, stage 3 unspecified: Secondary | ICD-10-CM | POA: Diagnosis not present

## 2013-02-01 DIAGNOSIS — D631 Anemia in chronic kidney disease: Secondary | ICD-10-CM | POA: Diagnosis not present

## 2013-02-01 DIAGNOSIS — I10 Essential (primary) hypertension: Secondary | ICD-10-CM | POA: Diagnosis not present

## 2013-02-01 DIAGNOSIS — N039 Chronic nephritic syndrome with unspecified morphologic changes: Secondary | ICD-10-CM | POA: Diagnosis not present

## 2013-02-01 DIAGNOSIS — N2581 Secondary hyperparathyroidism of renal origin: Secondary | ICD-10-CM | POA: Diagnosis not present

## 2013-02-03 DIAGNOSIS — D509 Iron deficiency anemia, unspecified: Secondary | ICD-10-CM | POA: Diagnosis not present

## 2013-02-03 DIAGNOSIS — J449 Chronic obstructive pulmonary disease, unspecified: Secondary | ICD-10-CM | POA: Diagnosis not present

## 2013-02-03 DIAGNOSIS — C3410 Malignant neoplasm of upper lobe, unspecified bronchus or lung: Secondary | ICD-10-CM | POA: Diagnosis not present

## 2013-03-03 DIAGNOSIS — C3410 Malignant neoplasm of upper lobe, unspecified bronchus or lung: Secondary | ICD-10-CM | POA: Diagnosis not present

## 2013-03-03 DIAGNOSIS — J449 Chronic obstructive pulmonary disease, unspecified: Secondary | ICD-10-CM | POA: Diagnosis not present

## 2013-03-03 DIAGNOSIS — D509 Iron deficiency anemia, unspecified: Secondary | ICD-10-CM | POA: Diagnosis not present

## 2013-03-06 DIAGNOSIS — C3410 Malignant neoplasm of upper lobe, unspecified bronchus or lung: Secondary | ICD-10-CM | POA: Diagnosis not present

## 2013-03-06 DIAGNOSIS — J449 Chronic obstructive pulmonary disease, unspecified: Secondary | ICD-10-CM | POA: Diagnosis not present

## 2013-03-06 DIAGNOSIS — D509 Iron deficiency anemia, unspecified: Secondary | ICD-10-CM | POA: Diagnosis not present

## 2013-03-14 DIAGNOSIS — H2510 Age-related nuclear cataract, unspecified eye: Secondary | ICD-10-CM | POA: Diagnosis not present

## 2013-03-14 DIAGNOSIS — H35349 Macular cyst, hole, or pseudohole, unspecified eye: Secondary | ICD-10-CM | POA: Diagnosis not present

## 2013-03-26 ENCOUNTER — Ambulatory Visit: Payer: Self-pay | Admitting: Internal Medicine

## 2013-03-26 DIAGNOSIS — C3410 Malignant neoplasm of upper lobe, unspecified bronchus or lung: Secondary | ICD-10-CM | POA: Diagnosis not present

## 2013-03-26 DIAGNOSIS — Z9221 Personal history of antineoplastic chemotherapy: Secondary | ICD-10-CM | POA: Diagnosis not present

## 2013-03-26 DIAGNOSIS — J449 Chronic obstructive pulmonary disease, unspecified: Secondary | ICD-10-CM | POA: Diagnosis not present

## 2013-03-26 DIAGNOSIS — K219 Gastro-esophageal reflux disease without esophagitis: Secondary | ICD-10-CM | POA: Diagnosis not present

## 2013-03-26 DIAGNOSIS — F29 Unspecified psychosis not due to a substance or known physiological condition: Secondary | ICD-10-CM | POA: Diagnosis not present

## 2013-03-26 DIAGNOSIS — Z923 Personal history of irradiation: Secondary | ICD-10-CM | POA: Diagnosis not present

## 2013-03-26 DIAGNOSIS — Z79899 Other long term (current) drug therapy: Secondary | ICD-10-CM | POA: Diagnosis not present

## 2013-03-26 DIAGNOSIS — I10 Essential (primary) hypertension: Secondary | ICD-10-CM | POA: Diagnosis not present

## 2013-03-26 LAB — CBC CANCER CENTER
BASOS PCT: 0.6 %
Basophil #: 0 x10 3/mm (ref 0.0–0.1)
EOS ABS: 0 x10 3/mm (ref 0.0–0.7)
EOS PCT: 0.2 %
HCT: 35.9 % (ref 35.0–47.0)
HGB: 11.5 g/dL — ABNORMAL LOW (ref 12.0–16.0)
LYMPHS ABS: 0.7 x10 3/mm — AB (ref 1.0–3.6)
Lymphocyte %: 13.1 %
MCH: 29.1 pg (ref 26.0–34.0)
MCHC: 32.1 g/dL (ref 32.0–36.0)
MCV: 91 fL (ref 80–100)
MONO ABS: 0.3 x10 3/mm (ref 0.2–0.9)
Monocyte %: 6.5 %
NEUTROS ABS: 4.2 x10 3/mm (ref 1.4–6.5)
NEUTROS PCT: 79.6 %
PLATELETS: 172 x10 3/mm (ref 150–440)
RBC: 3.96 10*6/uL (ref 3.80–5.20)
RDW: 17.3 % — AB (ref 11.5–14.5)
WBC: 5.3 x10 3/mm (ref 3.6–11.0)

## 2013-03-26 LAB — COMPREHENSIVE METABOLIC PANEL
ALBUMIN: 2.9 g/dL — AB (ref 3.4–5.0)
AST: 23 U/L (ref 15–37)
Alkaline Phosphatase: 74 U/L
Anion Gap: 9 (ref 7–16)
BUN: 19 mg/dL — ABNORMAL HIGH (ref 7–18)
Bilirubin,Total: 0.6 mg/dL (ref 0.2–1.0)
CREATININE: 1.57 mg/dL — AB (ref 0.60–1.30)
Calcium, Total: 9.4 mg/dL (ref 8.5–10.1)
Chloride: 94 mmol/L — ABNORMAL LOW (ref 98–107)
Co2: 27 mmol/L (ref 21–32)
EGFR (African American): 38 — ABNORMAL LOW
EGFR (Non-African Amer.): 33 — ABNORMAL LOW
GLUCOSE: 420 mg/dL — AB (ref 65–99)
Osmolality: 281 (ref 275–301)
Potassium: 4.6 mmol/L (ref 3.5–5.1)
SGPT (ALT): 17 U/L (ref 12–78)
SODIUM: 130 mmol/L — AB (ref 136–145)
Total Protein: 7.5 g/dL (ref 6.4–8.2)

## 2013-03-27 LAB — HEMOGLOBIN A1C: Hemoglobin A1C: 14.2 % — ABNORMAL HIGH (ref 4.2–6.3)

## 2013-03-27 LAB — GLUCOSE, RANDOM: Glucose: 218 mg/dL — ABNORMAL HIGH (ref 65–99)

## 2013-04-01 DIAGNOSIS — C3490 Malignant neoplasm of unspecified part of unspecified bronchus or lung: Secondary | ICD-10-CM | POA: Diagnosis not present

## 2013-04-02 DIAGNOSIS — H35349 Macular cyst, hole, or pseudohole, unspecified eye: Secondary | ICD-10-CM | POA: Diagnosis not present

## 2013-04-02 DIAGNOSIS — H25049 Posterior subcapsular polar age-related cataract, unspecified eye: Secondary | ICD-10-CM | POA: Diagnosis not present

## 2013-04-03 ENCOUNTER — Ambulatory Visit: Payer: Self-pay | Admitting: Internal Medicine

## 2013-05-03 ENCOUNTER — Ambulatory Visit: Payer: Self-pay | Admitting: Internal Medicine

## 2013-06-03 ENCOUNTER — Ambulatory Visit: Payer: Self-pay | Admitting: Internal Medicine

## 2013-07-03 ENCOUNTER — Ambulatory Visit: Payer: Self-pay | Admitting: Internal Medicine

## 2013-07-30 DIAGNOSIS — R0602 Shortness of breath: Secondary | ICD-10-CM | POA: Diagnosis not present

## 2013-07-30 DIAGNOSIS — Z79899 Other long term (current) drug therapy: Secondary | ICD-10-CM | POA: Diagnosis not present

## 2013-07-30 DIAGNOSIS — C3410 Malignant neoplasm of upper lobe, unspecified bronchus or lung: Secondary | ICD-10-CM | POA: Diagnosis not present

## 2013-07-30 LAB — HEPATIC FUNCTION PANEL A (ARMC)
ALK PHOS: 76 U/L
Albumin: 2.8 g/dL — ABNORMAL LOW (ref 3.4–5.0)
BILIRUBIN TOTAL: 0.4 mg/dL (ref 0.2–1.0)
Bilirubin, Direct: <0.1 mg/dL (ref 0.00–0.20)
SGOT(AST): 23 U/L (ref 15–37)
SGPT (ALT): 11 U/L — ABNORMAL LOW
Total Protein: 8.2 g/dL (ref 6.4–8.2)

## 2013-07-30 LAB — CBC CANCER CENTER
Basophil #: 0 x10 3/mm (ref 0.0–0.1)
Basophil %: 0.3 %
EOS PCT: 0.5 %
Eosinophil #: 0 x10 3/mm (ref 0.0–0.7)
HCT: 29.7 % — ABNORMAL LOW (ref 35.0–47.0)
HGB: 9.1 g/dL — ABNORMAL LOW (ref 12.0–16.0)
LYMPHS ABS: 0.7 x10 3/mm — AB (ref 1.0–3.6)
Lymphocyte %: 13.3 %
MCH: 26.1 pg (ref 26.0–34.0)
MCHC: 30.6 g/dL — ABNORMAL LOW (ref 32.0–36.0)
MCV: 86 fL (ref 80–100)
Monocyte #: 0.3 x10 3/mm (ref 0.2–0.9)
Monocyte %: 6.2 %
Neutrophil #: 4.4 x10 3/mm (ref 1.4–6.5)
Neutrophil %: 79.7 %
PLATELETS: 252 x10 3/mm (ref 150–440)
RBC: 3.48 10*6/uL — ABNORMAL LOW (ref 3.80–5.20)
RDW: 18.6 % — ABNORMAL HIGH (ref 11.5–14.5)
WBC: 5.6 x10 3/mm (ref 3.6–11.0)

## 2013-07-30 LAB — CREATININE, SERUM
Creatinine: 1.6 mg/dL — ABNORMAL HIGH (ref 0.60–1.30)
EGFR (African American): 37 — ABNORMAL LOW
EGFR (Non-African Amer.): 32 — ABNORMAL LOW

## 2013-07-30 LAB — CALCIUM: Calcium, Total: 8.7 mg/dL (ref 8.5–10.1)

## 2013-08-03 ENCOUNTER — Ambulatory Visit: Payer: Self-pay | Admitting: Internal Medicine

## 2013-09-03 ENCOUNTER — Ambulatory Visit: Payer: Self-pay | Admitting: Internal Medicine

## 2013-09-10 ENCOUNTER — Ambulatory Visit: Payer: Self-pay | Admitting: Internal Medicine

## 2013-09-24 DIAGNOSIS — E7800 Pure hypercholesterolemia, unspecified: Secondary | ICD-10-CM | POA: Insufficient documentation

## 2013-09-24 DIAGNOSIS — J449 Chronic obstructive pulmonary disease, unspecified: Secondary | ICD-10-CM | POA: Insufficient documentation

## 2013-09-24 DIAGNOSIS — Z8639 Personal history of other endocrine, nutritional and metabolic disease: Secondary | ICD-10-CM | POA: Insufficient documentation

## 2013-10-03 ENCOUNTER — Ambulatory Visit: Payer: Self-pay | Admitting: Internal Medicine

## 2013-11-03 ENCOUNTER — Ambulatory Visit: Payer: Self-pay | Admitting: Internal Medicine

## 2013-12-03 ENCOUNTER — Ambulatory Visit: Payer: Self-pay | Admitting: Internal Medicine

## 2013-12-06 LAB — CBC CANCER CENTER
BASOS ABS: 0 x10 3/mm (ref 0.0–0.1)
Basophil %: 0.3 %
EOS ABS: 0 x10 3/mm (ref 0.0–0.7)
Eosinophil %: 0.7 %
HCT: 27.6 % — ABNORMAL LOW (ref 35.0–47.0)
HGB: 8.4 g/dL — AB (ref 12.0–16.0)
Lymphocyte #: 0.7 x10 3/mm — ABNORMAL LOW (ref 1.0–3.6)
Lymphocyte %: 11.2 %
MCH: 25 pg — ABNORMAL LOW (ref 26.0–34.0)
MCHC: 30.6 g/dL — ABNORMAL LOW (ref 32.0–36.0)
MCV: 82 fL (ref 80–100)
Monocyte #: 0.4 x10 3/mm (ref 0.2–0.9)
Monocyte %: 6 %
NEUTROS PCT: 81.8 %
Neutrophil #: 5.2 x10 3/mm (ref 1.4–6.5)
Platelet: 244 x10 3/mm (ref 150–440)
RBC: 3.38 10*6/uL — ABNORMAL LOW (ref 3.80–5.20)
RDW: 19.7 % — AB (ref 11.5–14.5)
WBC: 6.3 x10 3/mm (ref 3.6–11.0)

## 2013-12-06 LAB — HEPATIC FUNCTION PANEL A (ARMC)
AST: 16 U/L (ref 15–37)
Albumin: 2.8 g/dL — ABNORMAL LOW (ref 3.4–5.0)
Alkaline Phosphatase: 82 U/L
Bilirubin, Direct: 0.1 mg/dL (ref 0.0–0.2)
Bilirubin,Total: 0.3 mg/dL (ref 0.2–1.0)
SGPT (ALT): 12 U/L — ABNORMAL LOW
TOTAL PROTEIN: 7.9 g/dL (ref 6.4–8.2)

## 2013-12-06 LAB — CREATININE, SERUM
CREATININE: 1.52 mg/dL — AB (ref 0.60–1.30)
EGFR (African American): 43 — ABNORMAL LOW
GFR CALC NON AF AMER: 36 — AB

## 2013-12-06 LAB — CALCIUM: Calcium, Total: 9.2 mg/dL (ref 8.5–10.1)

## 2013-12-06 LAB — FERRITIN: Ferritin (ARMC): 19 ng/mL (ref 8–388)

## 2013-12-19 LAB — OCCULT BLOOD X 1 CARD TO LAB, STOOL
OCCULT BLOOD, FECES: POSITIVE
Occult Blood, Feces: POSITIVE

## 2014-01-03 ENCOUNTER — Ambulatory Visit: Payer: Self-pay | Admitting: Internal Medicine

## 2014-01-17 DIAGNOSIS — R0600 Dyspnea, unspecified: Secondary | ICD-10-CM | POA: Diagnosis not present

## 2014-01-17 DIAGNOSIS — C3410 Malignant neoplasm of upper lobe, unspecified bronchus or lung: Secondary | ICD-10-CM | POA: Diagnosis not present

## 2014-01-17 DIAGNOSIS — Z452 Encounter for adjustment and management of vascular access device: Secondary | ICD-10-CM | POA: Diagnosis not present

## 2014-01-17 DIAGNOSIS — C3411 Malignant neoplasm of upper lobe, right bronchus or lung: Secondary | ICD-10-CM | POA: Diagnosis not present

## 2014-01-17 LAB — FOLATE: FOLIC ACID: 98.5 ng/mL — AB (ref 3.1–17.5)

## 2014-01-17 LAB — IRON AND TIBC
IRON BIND. CAP.(TOTAL): 338 ug/dL (ref 250–450)
IRON SATURATION: 20 %
IRON: 66 ug/dL (ref 50–170)
UNBOUND IRON-BIND. CAP.: 272 ug/dL

## 2014-01-20 LAB — PROT IMMUNOELECTROPHORES(ARMC)

## 2014-02-03 ENCOUNTER — Ambulatory Visit: Payer: Self-pay | Admitting: Internal Medicine

## 2014-02-05 ENCOUNTER — Inpatient Hospital Stay: Payer: Self-pay | Admitting: Internal Medicine

## 2014-02-05 DIAGNOSIS — C3491 Malignant neoplasm of unspecified part of right bronchus or lung: Secondary | ICD-10-CM | POA: Diagnosis not present

## 2014-02-05 DIAGNOSIS — N179 Acute kidney failure, unspecified: Secondary | ICD-10-CM | POA: Diagnosis not present

## 2014-02-05 DIAGNOSIS — I4891 Unspecified atrial fibrillation: Secondary | ICD-10-CM | POA: Diagnosis not present

## 2014-02-05 DIAGNOSIS — J449 Chronic obstructive pulmonary disease, unspecified: Secondary | ICD-10-CM | POA: Diagnosis not present

## 2014-02-05 DIAGNOSIS — Z8711 Personal history of peptic ulcer disease: Secondary | ICD-10-CM | POA: Diagnosis not present

## 2014-02-05 DIAGNOSIS — Z85118 Personal history of other malignant neoplasm of bronchus and lung: Secondary | ICD-10-CM | POA: Diagnosis not present

## 2014-02-05 DIAGNOSIS — Z87891 Personal history of nicotine dependence: Secondary | ICD-10-CM | POA: Diagnosis not present

## 2014-02-05 DIAGNOSIS — J410 Simple chronic bronchitis: Secondary | ICD-10-CM | POA: Diagnosis not present

## 2014-02-05 DIAGNOSIS — N189 Chronic kidney disease, unspecified: Secondary | ICD-10-CM | POA: Diagnosis not present

## 2014-02-05 DIAGNOSIS — D649 Anemia, unspecified: Secondary | ICD-10-CM | POA: Diagnosis not present

## 2014-02-05 DIAGNOSIS — E039 Hypothyroidism, unspecified: Secondary | ICD-10-CM | POA: Diagnosis not present

## 2014-02-05 DIAGNOSIS — K219 Gastro-esophageal reflux disease without esophagitis: Secondary | ICD-10-CM | POA: Diagnosis not present

## 2014-02-05 DIAGNOSIS — J9811 Atelectasis: Secondary | ICD-10-CM | POA: Diagnosis not present

## 2014-02-05 DIAGNOSIS — J984 Other disorders of lung: Secondary | ICD-10-CM | POA: Diagnosis not present

## 2014-02-05 DIAGNOSIS — Z923 Personal history of irradiation: Secondary | ICD-10-CM | POA: Diagnosis not present

## 2014-02-05 DIAGNOSIS — R0602 Shortness of breath: Secondary | ICD-10-CM | POA: Diagnosis not present

## 2014-02-05 DIAGNOSIS — Z9221 Personal history of antineoplastic chemotherapy: Secondary | ICD-10-CM | POA: Diagnosis not present

## 2014-02-05 DIAGNOSIS — C3490 Malignant neoplasm of unspecified part of unspecified bronchus or lung: Secondary | ICD-10-CM | POA: Diagnosis not present

## 2014-02-05 DIAGNOSIS — I10 Essential (primary) hypertension: Secondary | ICD-10-CM | POA: Diagnosis not present

## 2014-02-05 DIAGNOSIS — E860 Dehydration: Secondary | ICD-10-CM | POA: Diagnosis not present

## 2014-02-05 DIAGNOSIS — Z7952 Long term (current) use of systemic steroids: Secondary | ICD-10-CM | POA: Diagnosis not present

## 2014-02-05 DIAGNOSIS — I517 Cardiomegaly: Secondary | ICD-10-CM | POA: Diagnosis not present

## 2014-02-05 DIAGNOSIS — I5021 Acute systolic (congestive) heart failure: Secondary | ICD-10-CM | POA: Diagnosis not present

## 2014-02-05 LAB — BASIC METABOLIC PANEL
Anion Gap: 7 (ref 7–16)
BUN: 22 mg/dL — AB (ref 7–18)
CALCIUM: 8.4 mg/dL — AB (ref 8.5–10.1)
Chloride: 103 mmol/L (ref 98–107)
Co2: 25 mmol/L (ref 21–32)
Creatinine: 1.54 mg/dL — ABNORMAL HIGH (ref 0.60–1.30)
GFR CALC AF AMER: 43 — AB
GFR CALC NON AF AMER: 35 — AB
GLUCOSE: 123 mg/dL — AB (ref 65–99)
OSMOLALITY: 275 (ref 275–301)
Potassium: 3.9 mmol/L (ref 3.5–5.1)
SODIUM: 135 mmol/L — AB (ref 136–145)

## 2014-02-05 LAB — CBC
HCT: 29.6 % — ABNORMAL LOW
HGB: 9.1 g/dL — ABNORMAL LOW
MCH: 25.2 pg — ABNORMAL LOW
MCHC: 30.8 g/dL — ABNORMAL LOW
MCV: 82 fL
Platelet: 232 x10 3/mm 3
RBC: 3.61 X10 6/mm 3 — ABNORMAL LOW
RDW: 21.8 % — ABNORMAL HIGH
WBC: 5.2 x10 3/mm 3

## 2014-02-05 LAB — TROPONIN I
Troponin-I: <0.02 ng/mL
Troponin-I: <0.02 ng/mL

## 2014-02-05 LAB — MAGNESIUM: Magnesium: 2.1 mg/dL

## 2014-02-05 LAB — TSH: Thyroid Stimulating Horm: 2.78 u[IU]/mL

## 2014-02-05 LAB — T4, FREE: FREE THYROXINE: 1.06 ng/dL (ref 0.76–1.46)

## 2014-02-06 DIAGNOSIS — E860 Dehydration: Secondary | ICD-10-CM | POA: Diagnosis not present

## 2014-02-06 DIAGNOSIS — J410 Simple chronic bronchitis: Secondary | ICD-10-CM | POA: Diagnosis not present

## 2014-02-06 DIAGNOSIS — C3491 Malignant neoplasm of unspecified part of right bronchus or lung: Secondary | ICD-10-CM | POA: Diagnosis not present

## 2014-02-06 DIAGNOSIS — I4891 Unspecified atrial fibrillation: Secondary | ICD-10-CM | POA: Diagnosis not present

## 2014-02-06 LAB — BASIC METABOLIC PANEL
Anion Gap: 12 (ref 7–16)
BUN: 23 mg/dL — ABNORMAL HIGH (ref 7–18)
CALCIUM: 8.3 mg/dL — AB (ref 8.5–10.1)
CHLORIDE: 106 mmol/L (ref 98–107)
CO2: 22 mmol/L (ref 21–32)
CREATININE: 1.5 mg/dL — AB (ref 0.60–1.30)
EGFR (African American): 44 — ABNORMAL LOW
EGFR (Non-African Amer.): 36 — ABNORMAL LOW
Glucose: 111 mg/dL — ABNORMAL HIGH (ref 65–99)
Osmolality: 284 (ref 275–301)
Potassium: 4 mmol/L (ref 3.5–5.1)
SODIUM: 140 mmol/L (ref 136–145)

## 2014-02-06 LAB — CBC WITH DIFFERENTIAL/PLATELET
Basophil #: 0.2 10*3/uL — ABNORMAL HIGH (ref 0.0–0.1)
Basophil %: 4.9 %
EOS PCT: 0.8 %
Eosinophil #: 0 10*3/uL (ref 0.0–0.7)
HCT: 25.9 % — AB (ref 35.0–47.0)
HGB: 7.9 g/dL — ABNORMAL LOW (ref 12.0–16.0)
LYMPHS ABS: 0.5 10*3/uL — AB (ref 1.0–3.6)
Lymphocyte %: 11.9 %
MCH: 25.1 pg — AB (ref 26.0–34.0)
MCHC: 30.6 g/dL — ABNORMAL LOW (ref 32.0–36.0)
MCV: 82 fL (ref 80–100)
Monocyte #: 0.6 x10 3/mm (ref 0.2–0.9)
Monocyte %: 13 %
Neutrophil #: 3 10*3/uL (ref 1.4–6.5)
Neutrophil %: 69.4 %
Platelet: 198 10*3/uL (ref 150–440)
RBC: 3.16 10*6/uL — AB (ref 3.80–5.20)
RDW: 22 % — ABNORMAL HIGH (ref 11.5–14.5)
WBC: 4.4 10*3/uL (ref 3.6–11.0)

## 2014-02-06 LAB — HEMOGLOBIN: HGB: 7.7 g/dL — ABNORMAL LOW (ref 12.0–16.0)

## 2014-02-06 LAB — CREATININE, SERUM
CREATININE: 1.66 mg/dL — AB (ref 0.60–1.30)
EGFR (Non-African Amer.): 32 — ABNORMAL LOW
GFR CALC AF AMER: 39 — AB

## 2014-02-06 LAB — TSH: THYROID STIMULATING HORM: 3.55 u[IU]/mL

## 2014-02-06 LAB — TROPONIN I: Troponin-I: <0.02 ng/mL

## 2014-02-07 DIAGNOSIS — E860 Dehydration: Secondary | ICD-10-CM | POA: Diagnosis not present

## 2014-02-07 DIAGNOSIS — D649 Anemia, unspecified: Secondary | ICD-10-CM | POA: Diagnosis not present

## 2014-02-07 DIAGNOSIS — J410 Simple chronic bronchitis: Secondary | ICD-10-CM | POA: Diagnosis not present

## 2014-02-07 DIAGNOSIS — J449 Chronic obstructive pulmonary disease, unspecified: Secondary | ICD-10-CM | POA: Diagnosis not present

## 2014-02-07 DIAGNOSIS — C3491 Malignant neoplasm of unspecified part of right bronchus or lung: Secondary | ICD-10-CM | POA: Diagnosis not present

## 2014-02-07 DIAGNOSIS — I4891 Unspecified atrial fibrillation: Secondary | ICD-10-CM | POA: Diagnosis not present

## 2014-02-07 LAB — LIPID PANEL
Cholesterol: 138 mg/dL (ref 0–200)
HDL: 44 mg/dL (ref 40–60)
Ldl Cholesterol, Calc: 70 mg/dL (ref 0–100)
Triglycerides: 119 mg/dL (ref 0–200)
VLDL Cholesterol, Calc: 24 mg/dL (ref 5–40)

## 2014-02-07 LAB — TSH: Thyroid Stimulating Horm: 7.12 u[IU]/mL — ABNORMAL HIGH

## 2014-02-07 LAB — T4, FREE: Free Thyroxine: 1.07 ng/dL (ref 0.76–1.46)

## 2014-02-08 DIAGNOSIS — C3491 Malignant neoplasm of unspecified part of right bronchus or lung: Secondary | ICD-10-CM | POA: Diagnosis not present

## 2014-02-08 DIAGNOSIS — J449 Chronic obstructive pulmonary disease, unspecified: Secondary | ICD-10-CM | POA: Diagnosis not present

## 2014-02-08 DIAGNOSIS — J410 Simple chronic bronchitis: Secondary | ICD-10-CM | POA: Diagnosis not present

## 2014-02-08 DIAGNOSIS — I4891 Unspecified atrial fibrillation: Secondary | ICD-10-CM | POA: Diagnosis not present

## 2014-02-08 DIAGNOSIS — E860 Dehydration: Secondary | ICD-10-CM | POA: Diagnosis not present

## 2014-02-08 DIAGNOSIS — D649 Anemia, unspecified: Secondary | ICD-10-CM | POA: Diagnosis not present

## 2014-02-08 LAB — CBC WITH DIFFERENTIAL/PLATELET
BASOS ABS: 0 10*3/uL (ref 0.0–0.1)
BASOS PCT: 0.5 %
Eosinophil #: 0 10*3/uL (ref 0.0–0.7)
Eosinophil %: 0.2 %
HCT: 25.3 % — AB (ref 35.0–47.0)
HGB: 7.9 g/dL — AB (ref 12.0–16.0)
LYMPHS ABS: 0.5 10*3/uL — AB (ref 1.0–3.6)
Lymphocyte %: 7.1 %
MCH: 25.3 pg — ABNORMAL LOW (ref 26.0–34.0)
MCHC: 31.2 g/dL — AB (ref 32.0–36.0)
MCV: 81 fL (ref 80–100)
MONOS PCT: 8.7 %
Monocyte #: 0.6 x10 3/mm (ref 0.2–0.9)
NEUTROS ABS: 5.5 10*3/uL (ref 1.4–6.5)
Neutrophil %: 83.5 %
Platelet: 218 10*3/uL (ref 150–440)
RBC: 3.11 10*6/uL — ABNORMAL LOW (ref 3.80–5.20)
RDW: 21.7 % — ABNORMAL HIGH (ref 11.5–14.5)
WBC: 6.6 10*3/uL (ref 3.6–11.0)

## 2014-02-08 LAB — BASIC METABOLIC PANEL
ANION GAP: 9 (ref 7–16)
BUN: 15 mg/dL (ref 7–18)
CREATININE: 1.36 mg/dL — AB (ref 0.60–1.30)
Calcium, Total: 8.8 mg/dL (ref 8.5–10.1)
Chloride: 105 mmol/L (ref 98–107)
Co2: 21 mmol/L (ref 21–32)
EGFR (Non-African Amer.): 41 — ABNORMAL LOW
GFR CALC AF AMER: 49 — AB
Glucose: 157 mg/dL — ABNORMAL HIGH (ref 65–99)
OSMOLALITY: 274 (ref 275–301)
Potassium: 4 mmol/L (ref 3.5–5.1)
Sodium: 135 mmol/L — ABNORMAL LOW (ref 136–145)

## 2014-02-09 DIAGNOSIS — I4891 Unspecified atrial fibrillation: Secondary | ICD-10-CM | POA: Diagnosis not present

## 2014-02-09 DIAGNOSIS — J449 Chronic obstructive pulmonary disease, unspecified: Secondary | ICD-10-CM | POA: Diagnosis not present

## 2014-02-09 DIAGNOSIS — D649 Anemia, unspecified: Secondary | ICD-10-CM | POA: Diagnosis not present

## 2014-02-09 DIAGNOSIS — E860 Dehydration: Secondary | ICD-10-CM | POA: Diagnosis not present

## 2014-02-09 DIAGNOSIS — C3491 Malignant neoplasm of unspecified part of right bronchus or lung: Secondary | ICD-10-CM | POA: Diagnosis not present

## 2014-02-09 DIAGNOSIS — J410 Simple chronic bronchitis: Secondary | ICD-10-CM | POA: Diagnosis not present

## 2014-02-13 DIAGNOSIS — I10 Essential (primary) hypertension: Secondary | ICD-10-CM | POA: Diagnosis not present

## 2014-02-13 DIAGNOSIS — J449 Chronic obstructive pulmonary disease, unspecified: Secondary | ICD-10-CM | POA: Diagnosis not present

## 2014-02-13 DIAGNOSIS — I4891 Unspecified atrial fibrillation: Secondary | ICD-10-CM | POA: Diagnosis not present

## 2014-02-13 DIAGNOSIS — E785 Hyperlipidemia, unspecified: Secondary | ICD-10-CM | POA: Diagnosis not present

## 2014-02-13 DIAGNOSIS — I509 Heart failure, unspecified: Secondary | ICD-10-CM | POA: Diagnosis not present

## 2014-02-17 DIAGNOSIS — R0600 Dyspnea, unspecified: Secondary | ICD-10-CM | POA: Diagnosis not present

## 2014-02-17 DIAGNOSIS — C3410 Malignant neoplasm of upper lobe, unspecified bronchus or lung: Secondary | ICD-10-CM | POA: Diagnosis not present

## 2014-02-20 ENCOUNTER — Inpatient Hospital Stay: Payer: Self-pay | Admitting: Internal Medicine

## 2014-02-20 DIAGNOSIS — R0600 Dyspnea, unspecified: Secondary | ICD-10-CM | POA: Diagnosis not present

## 2014-02-20 DIAGNOSIS — Z4682 Encounter for fitting and adjustment of non-vascular catheter: Secondary | ICD-10-CM | POA: Diagnosis not present

## 2014-02-20 DIAGNOSIS — I429 Cardiomyopathy, unspecified: Secondary | ICD-10-CM | POA: Diagnosis not present

## 2014-02-20 DIAGNOSIS — J9620 Acute and chronic respiratory failure, unspecified whether with hypoxia or hypercapnia: Secondary | ICD-10-CM | POA: Diagnosis not present

## 2014-02-20 DIAGNOSIS — Z801 Family history of malignant neoplasm of trachea, bronchus and lung: Secondary | ICD-10-CM | POA: Diagnosis not present

## 2014-02-20 DIAGNOSIS — N170 Acute kidney failure with tubular necrosis: Secondary | ICD-10-CM | POA: Diagnosis not present

## 2014-02-20 DIAGNOSIS — I509 Heart failure, unspecified: Secondary | ICD-10-CM | POA: Diagnosis not present

## 2014-02-20 DIAGNOSIS — K31811 Angiodysplasia of stomach and duodenum with bleeding: Secondary | ICD-10-CM | POA: Diagnosis not present

## 2014-02-20 DIAGNOSIS — E039 Hypothyroidism, unspecified: Secondary | ICD-10-CM | POA: Diagnosis not present

## 2014-02-20 DIAGNOSIS — N261 Atrophy of kidney (terminal): Secondary | ICD-10-CM | POA: Diagnosis not present

## 2014-02-20 DIAGNOSIS — R443 Hallucinations, unspecified: Secondary | ICD-10-CM | POA: Diagnosis not present

## 2014-02-20 DIAGNOSIS — R0602 Shortness of breath: Secondary | ICD-10-CM | POA: Diagnosis not present

## 2014-02-20 DIAGNOSIS — R571 Hypovolemic shock: Secondary | ICD-10-CM | POA: Diagnosis not present

## 2014-02-20 DIAGNOSIS — E871 Hypo-osmolality and hyponatremia: Secondary | ICD-10-CM | POA: Diagnosis not present

## 2014-02-20 DIAGNOSIS — K859 Acute pancreatitis, unspecified: Secondary | ICD-10-CM | POA: Diagnosis not present

## 2014-02-20 DIAGNOSIS — R55 Syncope and collapse: Secondary | ICD-10-CM | POA: Diagnosis not present

## 2014-02-20 DIAGNOSIS — I482 Chronic atrial fibrillation: Secondary | ICD-10-CM | POA: Diagnosis not present

## 2014-02-20 DIAGNOSIS — C3491 Malignant neoplasm of unspecified part of right bronchus or lung: Secondary | ICD-10-CM | POA: Diagnosis not present

## 2014-02-20 DIAGNOSIS — K219 Gastro-esophageal reflux disease without esophagitis: Secondary | ICD-10-CM | POA: Diagnosis not present

## 2014-02-20 DIAGNOSIS — R4182 Altered mental status, unspecified: Secondary | ICD-10-CM | POA: Diagnosis not present

## 2014-02-20 DIAGNOSIS — Z7902 Long term (current) use of antithrombotics/antiplatelets: Secondary | ICD-10-CM | POA: Diagnosis not present

## 2014-02-20 DIAGNOSIS — I517 Cardiomegaly: Secondary | ICD-10-CM | POA: Diagnosis not present

## 2014-02-20 DIAGNOSIS — M6281 Muscle weakness (generalized): Secondary | ICD-10-CM | POA: Diagnosis not present

## 2014-02-20 DIAGNOSIS — C3490 Malignant neoplasm of unspecified part of unspecified bronchus or lung: Secondary | ICD-10-CM | POA: Diagnosis not present

## 2014-02-20 DIAGNOSIS — D62 Acute posthemorrhagic anemia: Secondary | ICD-10-CM | POA: Diagnosis not present

## 2014-02-20 DIAGNOSIS — Z87891 Personal history of nicotine dependence: Secondary | ICD-10-CM | POA: Diagnosis not present

## 2014-02-20 DIAGNOSIS — D638 Anemia in other chronic diseases classified elsewhere: Secondary | ICD-10-CM | POA: Diagnosis not present

## 2014-02-20 DIAGNOSIS — R531 Weakness: Secondary | ICD-10-CM | POA: Diagnosis not present

## 2014-02-20 DIAGNOSIS — I959 Hypotension, unspecified: Secondary | ICD-10-CM | POA: Diagnosis not present

## 2014-02-20 DIAGNOSIS — J449 Chronic obstructive pulmonary disease, unspecified: Secondary | ICD-10-CM | POA: Diagnosis not present

## 2014-02-20 DIAGNOSIS — J309 Allergic rhinitis, unspecified: Secondary | ICD-10-CM | POA: Diagnosis not present

## 2014-02-20 DIAGNOSIS — D509 Iron deficiency anemia, unspecified: Secondary | ICD-10-CM | POA: Diagnosis not present

## 2014-02-20 DIAGNOSIS — K922 Gastrointestinal hemorrhage, unspecified: Secondary | ICD-10-CM | POA: Diagnosis not present

## 2014-02-20 DIAGNOSIS — I129 Hypertensive chronic kidney disease with stage 1 through stage 4 chronic kidney disease, or unspecified chronic kidney disease: Secondary | ICD-10-CM | POA: Diagnosis not present

## 2014-02-20 DIAGNOSIS — J9600 Acute respiratory failure, unspecified whether with hypoxia or hypercapnia: Secondary | ICD-10-CM | POA: Diagnosis not present

## 2014-02-20 DIAGNOSIS — R6520 Severe sepsis without septic shock: Secondary | ICD-10-CM | POA: Diagnosis not present

## 2014-02-20 DIAGNOSIS — Z8711 Personal history of peptic ulcer disease: Secondary | ICD-10-CM | POA: Diagnosis not present

## 2014-02-20 DIAGNOSIS — E872 Acidosis: Secondary | ICD-10-CM | POA: Diagnosis not present

## 2014-02-20 DIAGNOSIS — I4891 Unspecified atrial fibrillation: Secondary | ICD-10-CM | POA: Diagnosis not present

## 2014-02-20 DIAGNOSIS — K210 Gastro-esophageal reflux disease with esophagitis: Secondary | ICD-10-CM | POA: Diagnosis not present

## 2014-02-20 DIAGNOSIS — N179 Acute kidney failure, unspecified: Secondary | ICD-10-CM | POA: Diagnosis not present

## 2014-02-20 DIAGNOSIS — R918 Other nonspecific abnormal finding of lung field: Secondary | ICD-10-CM | POA: Diagnosis not present

## 2014-02-20 DIAGNOSIS — K2270 Barrett's esophagus without dysplasia: Secondary | ICD-10-CM | POA: Diagnosis not present

## 2014-02-20 DIAGNOSIS — I5022 Chronic systolic (congestive) heart failure: Secondary | ICD-10-CM | POA: Diagnosis not present

## 2014-02-20 DIAGNOSIS — K921 Melena: Secondary | ICD-10-CM | POA: Diagnosis not present

## 2014-02-20 DIAGNOSIS — R42 Dizziness and giddiness: Secondary | ICD-10-CM | POA: Diagnosis not present

## 2014-02-20 DIAGNOSIS — R062 Wheezing: Secondary | ICD-10-CM | POA: Diagnosis not present

## 2014-02-20 DIAGNOSIS — Z85118 Personal history of other malignant neoplasm of bronchus and lung: Secondary | ICD-10-CM | POA: Diagnosis not present

## 2014-02-20 DIAGNOSIS — N183 Chronic kidney disease, stage 3 (moderate): Secondary | ICD-10-CM | POA: Diagnosis not present

## 2014-02-20 DIAGNOSIS — D500 Iron deficiency anemia secondary to blood loss (chronic): Secondary | ICD-10-CM | POA: Diagnosis not present

## 2014-02-20 DIAGNOSIS — J9690 Respiratory failure, unspecified, unspecified whether with hypoxia or hypercapnia: Secondary | ICD-10-CM | POA: Diagnosis not present

## 2014-02-20 DIAGNOSIS — Q2733 Arteriovenous malformation of digestive system vessel: Secondary | ICD-10-CM | POA: Diagnosis not present

## 2014-02-20 DIAGNOSIS — N19 Unspecified kidney failure: Secondary | ICD-10-CM | POA: Diagnosis not present

## 2014-02-21 DIAGNOSIS — D500 Iron deficiency anemia secondary to blood loss (chronic): Secondary | ICD-10-CM | POA: Diagnosis not present

## 2014-02-21 DIAGNOSIS — N179 Acute kidney failure, unspecified: Secondary | ICD-10-CM | POA: Diagnosis not present

## 2014-02-21 DIAGNOSIS — I4891 Unspecified atrial fibrillation: Secondary | ICD-10-CM | POA: Diagnosis not present

## 2014-02-21 DIAGNOSIS — N183 Chronic kidney disease, stage 3 (moderate): Secondary | ICD-10-CM | POA: Diagnosis not present

## 2014-02-21 DIAGNOSIS — J9690 Respiratory failure, unspecified, unspecified whether with hypoxia or hypercapnia: Secondary | ICD-10-CM | POA: Diagnosis not present

## 2014-02-22 DIAGNOSIS — N179 Acute kidney failure, unspecified: Secondary | ICD-10-CM | POA: Diagnosis not present

## 2014-02-22 DIAGNOSIS — D500 Iron deficiency anemia secondary to blood loss (chronic): Secondary | ICD-10-CM | POA: Diagnosis not present

## 2014-02-22 DIAGNOSIS — N183 Chronic kidney disease, stage 3 (moderate): Secondary | ICD-10-CM | POA: Diagnosis not present

## 2014-02-24 HISTORY — PX: UPPER GASTROINTESTINAL ENDOSCOPY: SHX188

## 2014-02-26 ENCOUNTER — Encounter: Payer: Self-pay | Admitting: Internal Medicine

## 2014-02-27 DIAGNOSIS — M810 Age-related osteoporosis without current pathological fracture: Secondary | ICD-10-CM | POA: Diagnosis not present

## 2014-02-27 DIAGNOSIS — I429 Cardiomyopathy, unspecified: Secondary | ICD-10-CM | POA: Diagnosis not present

## 2014-02-27 DIAGNOSIS — C3491 Malignant neoplasm of unspecified part of right bronchus or lung: Secondary | ICD-10-CM | POA: Diagnosis not present

## 2014-02-27 DIAGNOSIS — N183 Chronic kidney disease, stage 3 (moderate): Secondary | ICD-10-CM | POA: Diagnosis not present

## 2014-02-27 DIAGNOSIS — I4891 Unspecified atrial fibrillation: Secondary | ICD-10-CM | POA: Diagnosis not present

## 2014-02-27 DIAGNOSIS — Z87891 Personal history of nicotine dependence: Secondary | ICD-10-CM | POA: Diagnosis not present

## 2014-02-27 DIAGNOSIS — E039 Hypothyroidism, unspecified: Secondary | ICD-10-CM | POA: Diagnosis not present

## 2014-02-27 DIAGNOSIS — J449 Chronic obstructive pulmonary disease, unspecified: Secondary | ICD-10-CM | POA: Diagnosis not present

## 2014-02-27 DIAGNOSIS — K922 Gastrointestinal hemorrhage, unspecified: Secondary | ICD-10-CM | POA: Diagnosis not present

## 2014-02-27 DIAGNOSIS — K219 Gastro-esophageal reflux disease without esophagitis: Secondary | ICD-10-CM | POA: Diagnosis not present

## 2014-02-27 DIAGNOSIS — D62 Acute posthemorrhagic anemia: Secondary | ICD-10-CM | POA: Diagnosis not present

## 2014-02-27 DIAGNOSIS — R2689 Other abnormalities of gait and mobility: Secondary | ICD-10-CM | POA: Diagnosis not present

## 2014-02-27 DIAGNOSIS — Z79891 Long term (current) use of opiate analgesic: Secondary | ICD-10-CM | POA: Diagnosis not present

## 2014-02-27 DIAGNOSIS — M6281 Muscle weakness (generalized): Secondary | ICD-10-CM | POA: Diagnosis not present

## 2014-02-27 DIAGNOSIS — R531 Weakness: Secondary | ICD-10-CM | POA: Diagnosis not present

## 2014-02-27 DIAGNOSIS — J45909 Unspecified asthma, uncomplicated: Secondary | ICD-10-CM | POA: Diagnosis not present

## 2014-02-27 DIAGNOSIS — Q2733 Arteriovenous malformation of digestive system vessel: Secondary | ICD-10-CM | POA: Diagnosis not present

## 2014-02-27 DIAGNOSIS — Z79899 Other long term (current) drug therapy: Secondary | ICD-10-CM | POA: Diagnosis not present

## 2014-02-27 DIAGNOSIS — J309 Allergic rhinitis, unspecified: Secondary | ICD-10-CM | POA: Diagnosis not present

## 2014-02-27 DIAGNOSIS — Z96652 Presence of left artificial knee joint: Secondary | ICD-10-CM | POA: Diagnosis not present

## 2014-02-27 DIAGNOSIS — R571 Hypovolemic shock: Secondary | ICD-10-CM | POA: Diagnosis not present

## 2014-02-27 DIAGNOSIS — I509 Heart failure, unspecified: Secondary | ICD-10-CM | POA: Diagnosis not present

## 2014-02-27 DIAGNOSIS — Z794 Long term (current) use of insulin: Secondary | ICD-10-CM | POA: Diagnosis not present

## 2014-02-27 DIAGNOSIS — D638 Anemia in other chronic diseases classified elsewhere: Secondary | ICD-10-CM | POA: Diagnosis not present

## 2014-02-27 DIAGNOSIS — D509 Iron deficiency anemia, unspecified: Secondary | ICD-10-CM | POA: Diagnosis not present

## 2014-02-27 DIAGNOSIS — N19 Unspecified kidney failure: Secondary | ICD-10-CM | POA: Diagnosis not present

## 2014-02-27 DIAGNOSIS — Z7982 Long term (current) use of aspirin: Secondary | ICD-10-CM | POA: Diagnosis not present

## 2014-02-27 DIAGNOSIS — I5022 Chronic systolic (congestive) heart failure: Secondary | ICD-10-CM | POA: Diagnosis not present

## 2014-02-27 DIAGNOSIS — I482 Chronic atrial fibrillation: Secondary | ICD-10-CM | POA: Diagnosis not present

## 2014-02-27 DIAGNOSIS — J9690 Respiratory failure, unspecified, unspecified whether with hypoxia or hypercapnia: Secondary | ICD-10-CM | POA: Diagnosis not present

## 2014-02-27 DIAGNOSIS — D649 Anemia, unspecified: Secondary | ICD-10-CM | POA: Diagnosis not present

## 2014-02-27 DIAGNOSIS — I10 Essential (primary) hypertension: Secondary | ICD-10-CM | POA: Diagnosis not present

## 2014-02-27 DIAGNOSIS — E119 Type 2 diabetes mellitus without complications: Secondary | ICD-10-CM | POA: Diagnosis not present

## 2014-02-28 DIAGNOSIS — K922 Gastrointestinal hemorrhage, unspecified: Secondary | ICD-10-CM | POA: Diagnosis not present

## 2014-02-28 DIAGNOSIS — I509 Heart failure, unspecified: Secondary | ICD-10-CM | POA: Diagnosis not present

## 2014-02-28 DIAGNOSIS — J45909 Unspecified asthma, uncomplicated: Secondary | ICD-10-CM | POA: Diagnosis not present

## 2014-03-03 ENCOUNTER — Ambulatory Visit: Payer: Self-pay | Admitting: Internal Medicine

## 2014-03-03 DIAGNOSIS — Z96652 Presence of left artificial knee joint: Secondary | ICD-10-CM | POA: Diagnosis not present

## 2014-03-03 DIAGNOSIS — E119 Type 2 diabetes mellitus without complications: Secondary | ICD-10-CM | POA: Diagnosis not present

## 2014-03-03 DIAGNOSIS — Z79891 Long term (current) use of opiate analgesic: Secondary | ICD-10-CM | POA: Diagnosis not present

## 2014-03-03 DIAGNOSIS — D649 Anemia, unspecified: Secondary | ICD-10-CM | POA: Diagnosis not present

## 2014-03-03 DIAGNOSIS — Z7982 Long term (current) use of aspirin: Secondary | ICD-10-CM | POA: Diagnosis not present

## 2014-03-03 DIAGNOSIS — I10 Essential (primary) hypertension: Secondary | ICD-10-CM | POA: Diagnosis not present

## 2014-03-03 DIAGNOSIS — J309 Allergic rhinitis, unspecified: Secondary | ICD-10-CM | POA: Diagnosis not present

## 2014-03-03 DIAGNOSIS — Z794 Long term (current) use of insulin: Secondary | ICD-10-CM | POA: Diagnosis not present

## 2014-03-03 DIAGNOSIS — M6281 Muscle weakness (generalized): Secondary | ICD-10-CM | POA: Diagnosis not present

## 2014-03-03 DIAGNOSIS — M810 Age-related osteoporosis without current pathological fracture: Secondary | ICD-10-CM | POA: Diagnosis not present

## 2014-03-03 DIAGNOSIS — Z79899 Other long term (current) drug therapy: Secondary | ICD-10-CM | POA: Diagnosis not present

## 2014-03-03 DIAGNOSIS — R2689 Other abnormalities of gait and mobility: Secondary | ICD-10-CM | POA: Diagnosis not present

## 2014-03-04 ENCOUNTER — Encounter
Admit: 2014-03-04 | Disposition: A | Discharge status: Home / Self Care | Payer: Self-pay | Attending: Internal Medicine | Admitting: Internal Medicine

## 2014-03-18 ENCOUNTER — Ambulatory Visit: Payer: Self-pay | Admitting: Internal Medicine

## 2014-03-26 DIAGNOSIS — Z8719 Personal history of other diseases of the digestive system: Secondary | ICD-10-CM | POA: Insufficient documentation

## 2014-03-26 DIAGNOSIS — D500 Iron deficiency anemia secondary to blood loss (chronic): Secondary | ICD-10-CM | POA: Insufficient documentation

## 2014-04-04 ENCOUNTER — Encounter
Admit: 2014-04-04 | Disposition: A | Discharge status: Home / Self Care | Payer: Self-pay | Attending: Internal Medicine | Admitting: Internal Medicine

## 2014-04-11 ENCOUNTER — Ambulatory Visit
Admit: 2014-04-11 | Disposition: A | Discharge status: Home / Self Care | Payer: Self-pay | Attending: Internal Medicine | Admitting: Internal Medicine

## 2014-04-18 ENCOUNTER — Other Ambulatory Visit: Payer: Self-pay | Admitting: Internal Medicine

## 2014-04-18 DIAGNOSIS — C3411 Malignant neoplasm of upper lobe, right bronchus or lung: Secondary | ICD-10-CM

## 2014-04-22 NOTE — Op Note (Signed)
PATIENT NAME:  Katrina Kramer, Katrina Kramer MR#:  [id number] DATE OF BIRTH:  [birth year]  DATE OF PROCEDURE:  08/01/2011  PREOPERATIVE DIAGNOSIS: Lung cancer.   POSTOPERATIVE DIAGNOSIS: Lung cancer.   OPERATION PERFORMED: Insertion of left external jugular vein Port-A-Cath.   SURGEON: Yisel Megill E. Genevive Bi, MD  INDICATION: [name] is a 74 year old woman with a history of unresectable lung cancer who is receiving radiation therapy and chemotherapy. She refused Port-A-Cath insertion in the past, but has become a very difficult IV access and she is now offered the same procedure. The indications and risks were explained and the patient gave her informed consent.   DESCRIPTION OF PROCEDURE: The patient was brought to the operating suite and placed in the supine position. Laryngeal mask airway anesthesia was given. The patient was prepped and draped in the usual sterile fashion. We began by making a cut down on the external jugular vein. The external jugular vein was encircled with multiple 2-0 silk ties. It was ligated proximally and two sutures remained distally. The two sutures were lifted up and a small venotomy was made. The Port-A-Cath was then inserted through the external jugular vein into the superior vena cava. This was accomplished under fluoroscopic guidance. The two distal ties were then secured around the catheter. This gave hemostasis. The catheter was then tunneled from a subcutaneous port that had been created on the anterior chest wall. The catheter assembly was constructed and the catheter was placed in the subcutaneous pocket. Fluoroscopy was again carried out demonstrating the catheter to be in proper position. The catheter irrigated and flushed nicely. Three Prolene sutures were used to secure three of the four corners to the chest wall. The port          site was then closed with running Vicryl and nylon sutures. The cutdown site on the external jugular vein was closed with interrupted Vicryl and  nylon. The patient tolerated the procedure well and was taken to the recovery room in stable condition. ____________________________ Lew Dawes Genevive Bi, MD teo:slb D: 08/01/2011 14:39:11 ET T: 08/01/2011 15:04:13 ET JOB#: [id number]  cc: Christia Reading E. Genevive Bi, MD, <Dictator> Louis Matte MD ELECTRONICALLY SIGNED 08/22/2011 10:51

## 2014-04-22 NOTE — H&P (Signed)
PATIENT NAME:  Katrina Kramer, Katrina Kramer MR#:  [id number] DATE OF BIRTH:  July 07, 1940  DATE OF ADMISSION:  09/14/2011  CHIEF COMPLAINT/REASON FOR ADMISSION: Progressive dyspnea on exertion and at rest, newly diagnosed right-sided pneumothorax on CT scan just done yesterday (no history of falls or trauma), severe anemia with hemoglobin down to 6.9, for blood transfusion.   HISTORY OF PRESENT ILLNESS: Patient is a 74 year old female with history of non-small cell lung cancer, recently completed chemoradiation and was seen yesterday for follow up. Patient was experiencing progressive dyspnea on exertion. She had CT scan done yesterday also for follow up of lung cancer, report later in the day showed new onset right-sided pneumothorax. Also hemoglobin was 7.1 yesterday and patient was seen today for follow up of pneumothorax issue and facilitate chest tube placement by radiology as recommended by thoracic surgeon, Dr. Genevive Bi. Hemoglobin today is further lower at 6.9 grams. Patient is feeling significantly fatigued both on exertion and at rest, she has progressive dyspnea on exertion and at rest. Denies any chest pain or hemoptysis. She has ongoing cough issues. No fever or chills. Denies any falls or loss of consciousness. No history of trauma. No nausea, vomiting, or diarrhea at this time.   PAST MEDICAL HISTORY/PAST SURGICAL HISTORY:  1. Ex-smoker. 2. Chronic obstructive pulmonary disease. 3. Hypertension. 4. Iron deficiency anemia. 5. Diverticulosis. 6. Gastroesophageal reflux disease. 7. History of gastric ulcer, EGD and colonoscopy 2010. 8. Bronchoscopy January 2013 showed non-small cell carcinoma, has completed chemoradiation.  9. Gallstone pancreatitis April 2013 status post procedure at [facility].    FAMILY HISTORY: Remarkable for lung cancer in uncle, mother died of cancer of unknown primary.   SOCIAL HISTORY: Ex-smoker, 1/2 pack per day for 15 years. Denies alcohol usage. Ambulates slowly and short distances  within home.   ALLERGIES: No known drug allergies.   HOME MEDICATIONS:  1. Fergon 240 mg 1 tablet b.i.d.  2. Folic acid 1 mg b.i.d.  3. Metoprolol 25 mg b.i.d.  4. Multivitamin 1 tablet daily.  5. Zofran 4 mg every four hours p.r.n. for nausea, vomiting. 6. Percocet 5/325, 1 to 2 tablets every 4 to 6 hours p.r.n. for pain. 7. Ramipril 2.5 mg p.o. q.a.m.  8. Tussin cough syrup q.6 hours p.r.n. for cough.   REVIEW OF SYSTEMS: CONSTITUTIONAL: As in history of present illness. Feeling weak and tired. No fevers or chills. HEENT: No headaches. Mild dizziness on getting up and ambulating. No epistaxis, ear or jaw pain. CARDIAC: Denies any angina, palpitation, orthopnea, or paroxysmal nocturnal dyspnea. LUNGS: Has dyspnea and cough as in history of present illness. No hemoptysis. GASTROINTESTINAL: No nausea, vomiting, or diarrhea. Denies bright red blood in stools or melena. Stools are dark because of iron tablet. GENITOURINARY: No dysuria or hematuria. MUSCULOSKELETAL: No new bone pains. SKIN: No new rashes or pruritus. HEMATOLOGIC: Denies obvious bleeding symptoms. NEUROLOGIC: No new focal weakness, loss of consciousness, or seizures. ENDOCRINE: No polyuria, polydipsia. Eating fairly steady.   PHYSICAL EXAMINATION:  GENERAL: Patient is weak and tired looking, sitting in wheelchair, otherwise alert and oriented x4 and converses appropriately, no acute distress or tachypnea at rest. No icterus. Significant pallor present.   VITAL SIGNS: Temperature 97.6, pulse 106, respirations 18, blood pressure 98/65, 98% on room air.   HEENT: Normocephalic, atraumatic. Extraocular movements intact. Sclera anicteric. No oral thrush.   NECK: Supple without lymphadenopathy.   CARDIOVASCULAR: S1, S2, mildly tachycardic, regular rate and rhythm.   LUNGS: Lungs show bilateral diminished breath sounds overall, more so  on the right side. No crepitations or rhonchi.   ABDOMEN: Soft, nontender. No hepatomegaly. Bowel  sounds present.   EXTREMITIES: No major edema or cyanosis.   SKIN: No generalized rashes or bruising.   NEUROLOGIC: Limited exam given her condition. Cranial nerves are intact, moves all extremities spontaneously. Gait not checked.   LABORATORY, DIAGNOSTIC, AND RADIOLOGICAL DATA: WBC 4700, hemoglobin 6.9, platelets 100, ANC 3100. Creatinine yesterday 1.19, potassium 3.8, calcium 9.0, glucose 145. Liver functions unremarkable except albumin of 2.2.   IMPRESSION AND PLAN:  1. History of locally advanced non-small cell lung cancer as described above status post chemoradiation. CT scan done yesterday shows continued response to treatment. Plan from cancer standpoint is continued monitoring at this time.  2. Right-sided pneumothorax, new. Per discussion with Dr. Genevive Bi patient needs CT-guided placement of drainage tube and hospitalization for chest tube management. I have discussed with Dr. Register in radiology who will perform procedure today as soon as possible following which she will be admitted to hospital. Continue Percocet p.r.n. for any pain issues. Continue to monitor and oxygen p.r.n. Will consult Dr. Genevive Bi for continued chest tube management issues.  3. Progressive anemia. Patient has had severe anemia issues since being on chemotherapy, also some evidence of iron deficiency. Continue on oral iron supplement. Given hemoglobin less than 7 grams with ongoing pneumothorax, dyspnea and fatigability, will transfuse 2 units of packed red blood cells in hospital, each unit over three hours, premedicate with Tylenol and Benadryl. Patient explained about risks and benefits of transfusion and she is agreeable to this. Will repeat CBC tomorrow a.m.  4. Continue other home medications same as before including metoprolol tartrate 25 mg b.i.d., Zofran 4 mg every four hours p.r.n., Percocet 5/325 mg 1 to 2 tablets every 4 to 6 hours p.r.n. for pain, ramipril 2.5 mg p.o. daily. Will add Tessalon Perles p.r.n. for  cough and Tussionex syrup p.r.n. for cough.   Patient explained above, she is agreeable to this plan.  ____________________________ Rhett Bannister. Ma Hillock, MD srp:cms D: 09/14/2011 16:53:46 ET T: 09/14/2011 17:20:37 ET JOB#: [id number]  cc: Demond Shallenberger R. Ma Hillock, MD, <Dictator>  Alveta Heimlich MD ELECTRONICALLY SIGNED 09/14/2011 21:53

## 2014-04-25 NOTE — Discharge Summary (Signed)
PATIENT NAME:  Katrina Kramer, Katrina Kramer MR#:  [id number] DATE OF BIRTH:  10-12-1940  DATE OF ADMISSION:  01/18/2012 DATE OF DISCHARGE:  01/24/2012  ADMITTING DIAGNOSIS: Recurrent right pleural effusion.   DISCHARGE DIAGNOSIS: Recurrent right pleural effusion.  HOSPITAL COURSE: [name] is a 74 year old African American female with a history of right lung cancer status post radiation and chemotherapy. She has had several prior thoracentesis for recurrent right-sided pleural effusions. She was evaluated and found to be a suitable candidate for a right thoracoscopy, pleural biopsy and talc pleurodesis. She underwent this procedure on January 15th and at the time of the thoracoscopy no evidence of cancer was identified. She did have evacuation of her pleural fluid which also was negative for malignancy. Talc pleurodesis was carried out and the chest tube was ultimately removed on 01/23/2012. She was subsequently discharged to home on 01/24/2012. At the time of discharge, the final pathology revealed no evidence of malignancy, in the pleural fluid or pleural biopsies. The patient was given a prescription for Percocet to take for pain.   DISCHARGE MEDICATIONS:  1. Metoprolol 25 mg twice a day.  2. Multivitamins. 3. Benzonatate 100 mg capsule 3 times a day as needed for cough. 4. Folic acid 1 mg once a day. 5. Iron in the form of Fergon 240 mg twice a day. 6. Ramipril 2.5 mg once a day.            DISCHARGE INSTRUCTIONS: She was scheduled to follow up with Dr. Ma Hillock in 1 week. She will have her sutures taken out by Dr. Beverly Gust nurse, Mrs. Moishe Spice. She will see me in 2 weeks.  ____________________________ Lew Dawes Genevive Bi, MD teo:sb D: 01/24/2012 09:46:37 ET T: 01/24/2012 10:22:39 ET JOB#: [id number]  cc: Lew Dawes. Genevive Bi, MD, <Dictator> Louis Matte MD ELECTRONICALLY SIGNED 02/14/2012 15:53

## 2014-04-25 NOTE — Op Note (Signed)
PATIENT NAME:  Katrina Kramer, Katrina Kramer MR#:  [id number] DATE OF BIRTH:  1940-07-19  DATE OF PROCEDURE:  01/18/2012  SURGEON:  Nestor Lewandowsky, MD  ASSISTANT:  Joie Bimler, [place] student  PREOPERATIVE DIAGNOSIS:  Recurrent right-sided pleural effusion.   POSTOPERATIVE DIAGNOSIS:  Recurrent right-sided pleural effusion.   OPERATION PERFORMED: 1.  Preoperative bronchoscopy with bronchoalveolar lavage right upper lobe.  2.  Right thoracoscopy with pleural biopsy and talc pleurodesis.   INDICATIONS FOR PROCEDURE:  The patient is a 74 year old African American woman who was treated with radiation and chemotherapy for a right lung cancer. She has developed a recurrent right-sided pleural effusion and has been symptomatic. In order to make a definitive diagnosis and to treat her recurrent pleural effusion, she was offered the above-named procedure. The indications and risks of the procedure were explained to the patient, who gave her informed consent.   DESCRIPTION OF PROCEDURE: The patient was brought to the operating suite and placed in the supine position. General endotracheal anesthesia was given through a double-lumen tube. Preoperative bronchoscopy was carried out. The left side was normal. The right side showed extensive cobblestoning of the mucosa on the right bronchus intermedius. This appeared more like fibrotic areas than tumor. There was near complete collapse of the right middle lobe bronchus. The upper lobe had some finger-like projections of a tan-yellowish material. A BAL of this area was performed. This was sent for cytology.   At this point in time, the patient was then turned for right thoracoscopy. All pressure points were carefully padded. The patient was prepped and draped in the usual sterile fashion. A single thoracic port was created in the posterior axillary line at approximately the fifth intercostal space. The incision was deepened down through the muscles of the chest wall until the pleural  space was entered. Upon entering the pleural space, we suctioned 1 liter of clear yellow fluid. The thoracoscope was then introduced and complete thoracoscopy was carried out. The middle and upper lobes had some areas of adhesion to the anterior and lateral chest walls. These were left intact. The pleural space was suctioned dry. There was no evidence of pleural metastases on gross examination of the pleural space. There did, however, appear to be a pericardial effusion. This was left alone. Then 5 grams of sterile talc was insufflated under direct visualization, coating the entire pleural surface. The chest was drained with a 28 Blake positioned along the diaphragmatic surface. The wound was then closed with multiple layers of running absorbable sutures. The skin was closed with nylon. The tube was secured with silk. Sterile dressings were applied. The patient was then rolled in the supine position, where she was extubated and then taken to the recovery room in stable condition.    ____________________________ Lew Dawes Genevive Bi, MD teo:cs D: 01/18/2012 16:23:50 ET T: 01/18/2012 19:55:29 ET JOB#: [id number]  cc: Christia Reading E. Genevive Bi, MD, <Dictator> [place], [place] student Sandeep R. Ma Hillock, MD Louis Matte MD ELECTRONICALLY SIGNED 01/24/2012 9:51

## 2014-04-27 NOTE — Consult Note (Signed)
Chief Complaint:   Subjective/Chief Complaint Patient seen on the request of Dr. Vira Agar.  Impression: Acute pancreatitis with abnormal LFT's. ? gallstone pancreatitis. CT showed dilated biliary tree as well as a ? ampullary mass. LFT's are slightly better as well as her pain. Lipase remains high.  Recommendations: EGD today to r/o ampullary mass today. This will help in further management. The procedure has been discussed with the patient and she is in full agreement. If EGD is negative for ampullary mass, will proceed with an MRCP. ERCP depending on the result of MRCP and repeat labs as well as clinical course. Discussed with Dr. Vira Agar.   Electronic Signatures: Jill Side (MD)  (Signed 03-Apr-13 10:33)  Authored: Chief Complaint   Last Updated: 03-Apr-13 10:33 by Jill Side (MD)

## 2014-04-27 NOTE — Discharge Summary (Signed)
PATIENT NAME:  Katrina Kramer, Katrina Kramer MR#:  [id number] DATE OF BIRTH:  Oct 26, 1940  DATE OF ADMISSION:  04/05/2011 DATE OF DISCHARGE:  04/07/2011  DISCHARGE DIAGNOSES:  1. Acute pancreatitis, abnormal LFTs of unclear etiology causing vomiting and dehydration. EGD suggestive of duodenal tumor, biopsy nondiagnostic.  2. 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 Follow-up CT scan showed partial response.   HISTORY OF PRESENT ILLNESS: The patient is a 74 year old female with past medical history significant for non-small-cell right upper lobe lung cancer who recently completed two cycles of Taxol and carboplatin on 03/08. Follow-up CT scan showed partial response. She was seen by thoracic surgeon Dr. Genevive Bi on 04/01 and was being planned for a repeat bronchoscopy to see if surgical resection was feasible versus considering chemoradiation. Otherwise, she also has history of chronic obstructive pulmonary disease with history of smoking in the past, hypertension, iron deficiency anemia, diverticulosis, gastroesophageal reflux disease, history of gastric ulcers, bronchoscopy January 2013 diagnosing lung cancer. The patient presented on 04/02 with symptoms of upper abdominal and adjacent back pain, severe refractory vomiting, and dehydration. Lipase level was greater than 3000. Liver functions abnormal. She was admitted to the hospital for pancreatitis management. No history of alcohol usage. No fever or chills.   PAST MEDICAL HISTORY/PAST SURGICAL HISTORY: As in history of present illness above.   SOCIAL HISTORY/ FAMILY HISTORY/ALLERGIES/ MEDICATIONS/ REVIEW OF SYSTEMS/ PHYSICAL EXAMINATION: Refer to History and Physical note for details.   HOSPITAL COURSE: Labs on admission showed WBC 8600, hemoglobin 8.6, platelets 178,  ANC 7500, creatinine 1.24, potassium 2.9, lipase greater than 3000, bilirubin 5, alkaline phosphatase 286, SGPT 262, SGOT 328,  albumin 2.9. The patient was kept n.p.o. except ice chips and medication. GI was consulted. They got a CT scan of the abdomen with contrast on the night of 04/02, which showed evidence of acute pancreatitis with peripancreatic fluid and inflammatory changes, new dilatation of common bile duct, common hepatic duct, and intrahepatic ducts. No splenomegaly. The patient also underwent an EGD on 04/03 which showed a medium-size submucosal duodenal mass, which was biopsied but returned nondiagnostic on pathology, ampullary orifice was not visible. Lipase level today was still very abnormal at greater than 3000, bilirubin was up to 6.6 with minor improvement in liver transaminases. Per discussion with GI Dr. Vira Agar, he felt that the patient has some kind of ductal obstruction which cannot be managed further here and [facility] and procedures as indicated, with differential being possibility of stones versus malignancy in this area given CA19-9 returned greater than 1000.  Preliminary report of MR cholangiogram done today showed filling defects in the dependent portion of the gallbladder extending into the neck, and a mural-based filling defect within the proximal common bile duct likely representing sequela of sludging, as well as possible gallstones within the gallbladder neck. ERCP was recommended. 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 The patient also had worsening of her chronic anemia, and hemoglobin was down to 7.2 yesterday. She received 2 units of packed red blood cells and hemoglobin today was doing better at 9.4. WBC remained normal at 6000  with unremarkable differential. Platelets slightly below normal at 134,000  likely from residual chemotherapy effect. As recommended by GI, [facility] was contacted per patient's and her husband's preference. I have discussed with the hospitalist Dr. Criselda Peaches, who has accepted the patient.  She will be transferred there once a bed is available.   CURRENT MEDICATIONS IN HOSPITAL:  1. Folic acid 1 mg p.o. daily.  2. Lopressor 25 mg p.o. b.i.d.  3. Ramipril 2.5 mg p.o. daily.  4. Tylenol 650 mg q. 6 hours p.r.n. for pain or fever.  5. Tussionex 5 mL q. 12 hours p.r.n. for cough.  6. Morphine injection 2 to 4 mg IV q.1-2 h. p.r.n. for pain.  7. Zofran 4 mg IV q. 4 hours p.r.n. for nausea or vomiting.      ____________________________ Rhett Bannister. Ma Hillock, MD srp:bjt D: 04/07/2011 15:54:35 ET T: 04/07/2011 17:04:13 ET JOB#: [id number] Rhett Bannister Breckin Zafar MD ELECTRONICALLY SIGNED 04/07/2011 17:33

## 2014-04-27 NOTE — Consult Note (Signed)
Reason for Visit: This 74 year old Female patient presents to the clinic for initial evaluation of  Lung cancer .   Referred by Dr. Ma Hillock.  Diagnosis:   Chief Complaint/Diagnosis   74 year old female with stage IIIa (T4, N0, M0) right upper lobe non-small cell lung cancer status post induction chemotherapy with persistent disease.   Pathology Report Pathology report reviewed    Imaging Report PET/CT and CT scans reviewed    Referral Report Clinical notes reviewed    Planned Treatment Regimen Concurrent chemotherapy and radiation therapy and split course fashion    HPI   patient is a 74 year old female who presented initially with an approximate 10 pound weight loss pneumonia dry nonproductive cough. She was found a large right upper lobe mass. Cytology was positive for non-small cell lung cancer. He was decided to go ahead with induction chemotherapy using carboplatin and Taxol for 2 cycles and evaluate for response. She had followup bronchoscopy still showing 90% occlusion of the right mainstem take off. According to thoracic surgery this would require a pneumonectomy of which the patient would not tolerate. She tolerated her chemotherapy well although she did have a hospital admission for dehydration abdominal pain and weakness. She seen today in doing fairly well. She is having no significant chest tightness at this time no cough or hemoptysis.  Past Hx:    hypertension:    iron def. anemia:    lung cancer-non small cell ca:    DENIES:   Past, Family and Social History:   Past Medical History positive    Cardiovascular hypertension    Past Medical History Comments Iron deficiency anemia    Family History noncontributory    Social History positive    Social History Comments Greater than 30-pack-year smoking history quit smoking a few years prior no EtOH use history    Additional Past Medical and Surgical History Accompanied by husband today   Allergies:   No Known  Allergies:   Home Meds:  Home Medications: Medication Instructions Status  ondansetron 4 mg tablet 1 tablet orally every 4 hours as needed for nausea or vomiting Active  promethazine 25 mg tablet 1 tab(s) orally every 6 hours as needed for nausea or vomiting Active  dexamethasone 4 mg tablet Take 4 tablets (16 mg) at 10PM the night before and take 4 tablets (16 mg) at 6AM morning of each Taxol chemotherapy (first dose of Taxol chemo planned on 05/06/60) Active  folic acid 1 mg tablet 1 tab(s) orally once a day Active  metoprolol tartrate 25 mg oral tablet 1 tab(s) orally 2 times a day Active  ramipril 2.5 mg oral capsule 1 cap(s) orally once a day Active  Fergon 240 mg (27 mg elemental iron) oral tablet 1 tab(s) orally once a day Active  multivitamin 1 tab(s) orally once a day Active  Tussin Cough 1  orally every 6 hours, As Needed for cough Active   Review of Systems:   General negative    Performance Status (ECOG) 0    Skin negative    Breast negative    Ophthalmologic negative    ENMT negative    Respiratory and Thorax see HPI    Cardiovascular negative    Gastrointestinal negative    Genitourinary negative    Musculoskeletal negative    Neurological negative    Psychiatric negative    Hematology/Lymphatics negative    Endocrine negative    Allergic/Immunologic negative   Physical Exam:  General/Skin/HEENT:   General normal  Skin normal    Eyes normal    ENMT normal    Head and Neck normal    Additional PE Well-developed well nourished female in NAD slightly obese. No cervical or supraclavicular adenopathy is appreciated. Lungs are clear to A&P cardiac examination shows regular rate and rhythm without murmur rub or thrill. Abdomen is benign with no organomegaly or masses noted.   Breasts/Resp/CV/GI/GU:   Respiratory and Thorax normal    Cardiovascular normal    Gastrointestinal normal    Genitourinary normal   MS/Neuro/Psych/Lymph:    Musculoskeletal normal    Neurological normal    Lymphatics normal   Other Results:  Radiology Results: MRI:    29-Jan-13 14:44, MRI Brain  With/Without Contrast   MRI Brain  With/Without Contrast    REASON FOR EXAM:    non small cell lung cancer  COMMENTS:       PROCEDURE: MR  - MR BRAIN WO/W CONTRAST  - Feb 01 2011  2:44PM     RESULT:     History: Small cell cancer.    Comparison Study: No recent.    Findings: Multiplanar, multisequence imagingof the Brain is obtained. 17   cc of MultiHance administered. No mass lesion or enhancing lesion. Eighth   nerve complexes are normal. Diffusion-weighted images reveal no evidence   of acute ischemia. Subcortical and deep white matter changes are noted     consistent with chronic ischemia.    IMPRESSION:  No acute or focal abnormality identified. White matter   changes are noted consistent with chronic ischemia. No evidence of   metastatic disease.    Thank you for the opportunity to contribute to the care of your patient.           Verified By: Osa Craver, M.D., MD  CT:    22-Mar-13 15:44, CT Chest With Contrast   CT Chest With Contrast    REASON FOR EXAM:    restaging lung CA pt on chemo  COMMENTS:       PROCEDURE: CT  - CT CHEST WITH CONTRAST  - Mar 25 2011  3:44PM     RESULT: History: Lung cancer.    Comparison Study: Prior CT of 12/31/2010.    Findings: Standard CT obtained with 75cc of Isovue 300. Again noted is   right upper lobe /  hilar mass with right upper lobe collapse. 4.2 x 4.2   cm slight enhancing masses in the upper lobe. This is decreased in size   from prior exam. Right hilar adenopathy remains present. Pulmonary  arteries  are normal. Coronary artery disease present. Adrenals are   normal. Generative change thoracic spine.  IMPRESSION:  Interim slight improvement of right upper lobe and right   hilar mass. Persistent atelectasis right upper lobe.          Verified By: Osa Craver, M.D., MD  Nuclear Med:    22-Jan-13 10:40, PET/CT Scan Lung Cancer Initial Staging   PET/CT Scan Lung Cancer Initial Staging    REASON FOR EXAM:    staging newly diagnosed non small cell RU lung cancer  COMMENTS:       PROCEDURE: PET - PET/CT INIT STAGING LUNG CA  - Jan 25 2011 10:40AM     RESULT:     History: Small cell lung cancer.    Procedure and Findings:  Following determination of a fasting blood sugar   of 148 mg per deciliter, 12.52 mCi of F-18 FDG was administered. An  intensely PET positive approximately 7 cm mass lesion is present with a   mean SUV of 25. This is consistent with lung cancer. No evidence of   metastatic disease. CT reveals coronary artery disease.    IMPRESSION:      Large approximately 7 cm intensely PET positive right upper lobe mass   lesion. There is adjacent atelectasis on CT. No evidence of metastatic   disease.    Thank you for the opportunity to contribute to the care of your patient.           Verified By: Osa Craver, M.D., MD   Assessment and Plan:  Impression:   stage IIIa non-small cell lung cancer with minimal response to initial chemotherapy and declined per thoracic surgery for pneumonectomy.  Plan:   at the stomach to go ahead with concurrent chemotherapy and radiation therapy. I will treat in a split course fashion up to 4000 cGy and after a two-week break reevaluate for response. Risks and benefits of treatment were reviewed with the patient and her husband. Side effects including some dysphasia, skin reaction, overall fatigue and damage to normal lung were all explained in detail to the patient and her husband. They both seem to comprehend my treatment plan well. I discussed the case personally with Dr. Ma Hillock and Dr. Faith Rogue. I have set her up for CT simulation early next week.  I would like to take this opportunity to thank you for allowing me to continue to participate in this patient's care.  CC Referral:    cc: Dr. Domenick Gong   Electronic Signatures: Baruch Gouty, Roda Shutters (MD)  (Signed 19-Apr-13 12:10)  Authored: HPI, Diagnosis, Past Hx, PFSH, Allergies, Home Meds, ROS, Physical Exam, Other Results, Encounter Assessment and Plan, CC Referring Physician   Last Updated: 19-Apr-13 12:10 by Armstead Peaks (MD)

## 2014-04-27 NOTE — Consult Note (Signed)
Dr. Michaelle Copas note and work appreciated.  Await biopsies, consider transfer to med center for EUS and endoscopic removal.  Check MRCP tomorrow.  Electronic Signatures: Manya Silvas (MD)  (Signed on 03-Apr-13 17:10)  Authored  Last Updated: 03-Apr-13 17:10 by Manya Silvas (MD)

## 2014-04-27 NOTE — Consult Note (Signed)
PATIENT NAME:  Katrina Kramer, Katrina Kramer MR#:  [id number] DATE OF BIRTH:  [birth year]  DATE OF CONSULTATION:  04/05/2011  REFERRING PHYSICIAN:  Sandeep R. Ma Hillock, MD  CONSULTING PHYSICIAN:  Manya Silvas, MD  REASON FOR CONSULTATION:  The patient is a 74 year old black female who was admitted because of vomiting and abdominal pain. I was asked to see her in consultation when her lipase came back showing greater than 3000. The patient says that her illness started last night about 2:00 in the morning. She had some nausea, some discomfort. About 5:00 in the morning she got up and proceeded to have a lot of vomiting. She then tried to eat some oatmeal and drink some juice before going to the ER. She got to the ER and had more vomiting. She was evaluated in the ER, found to have jaundice and very elevated liver functions and lipase, and was admitted to the hospital.  I was asked to see her in consultation.   PAST MEDICAL HISTORY:  1. Gastric ulcer disease. 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  2. The patient has non-small cell lung cancer and has undergone chemotherapy for this. She had taken chemotherapy recently.   PAST MEDICAL HISTORY:  1. Arthritis.  2. Ulcer disease.  3. Colon polyps.  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   PAST SURGICAL HISTORY:  1. Tonsillectomy.  2. Lumpectomy on the right breast.   ALLERGIES: No known drug allergies.   HABITS: She smoked a pack a day for probably 30 years, worked in a [employer] for probably about that long.   FAMILY HISTORY: Mother had some type of cancer to her back.  REVIEW OF SYSTEMS: The patient has been feeling some chills. She denies any syncope, denies severe headaches. No hematemesis. No hematochezia. No significant constipation or diarrhea. No dysuria or hematuria. No prior history of pancreatitis or known gallstone problems. No prior history of abdominal surgery.   PHYSICAL  EXAMINATION:  GENERAL: Black female in no acute distress.   VITAL SIGNS: Temperature 97.7, pulse 84, blood pressure 103/67, pulse oximetry 96% on room air.   HEENT: Sclerae are icteric. Conjunctivae are somewhat pale. Tongue is somewhat pale. Head is atraumatic. Trachea is in the midline.   CHEST: Clear to auscultation.   HEART: No murmurs or gallops I can hear.   ABDOMEN: Bowel sounds are present, mildly diminished. No hepatosplenomegaly. No masses. No bruits.   EXTREMITIES: No edema.   SKIN: Warm and dry.   PSYCHIATRIC: Mood and affect are appropriate.   LABORATORY, DIAGNOSTIC AND RADIOLOGICAL DATA:  Glucose 213, BUN 22, creatinine 1.24, sodium 136, potassium 2.9, chloride 100, CO2 27, calcium 8.4, lipase greater than 3000, total protein 7.6, albumin 2.9, total bilirubin 5, direct bilirubin 4.1, alkaline phosphatase 286, SGOT 328, SGPT 262.  CPK and MBs and troponins are negative.  White blood count 8.6, hemoglobin is 8.6, platelet count 178.  Abdominal film shows a bowel gas pattern suggesting constipation.   ASSESSMENT: Given the patient's sudden onset of discomfort and vomiting with very elevated lipase, very elevated direct bilirubin, elevated transaminases, the most likely entity for this would be a gallstone causing acute gallstone pancreatitis. I am uncertain as to what chemotherapy medication she is on and as to the possibility that this could be related to chemotherapy medicine.   PLAN: The patient is doing remarkably well and feeling much better after IV morphine, enough to suggest it is possible  that she may have passed a stone and things may be improving from here. She will need a CAT scan of the abdomen and possibly an MRCP. She will need follow-up labs in the morning. If she needs an ERCP and stone removal because of her common bile duct stone, she may have to be transferred somewhere else. Dr. Dionne Milo and Dr. Candace Cruise, I believe, are out of town this week. I will follow with  you.   ____________________________ Manya Silvas, MD rte:cbb D: 04/05/2011 17:43:10 ET T: 04/05/2011 18:06:33 ET JOB#: [id number]  cc: Manya Silvas, MD, <Dictator> Sandeep R. Ma Hillock, MD Manya Silvas MD ELECTRONICALLY SIGNED 04/18/2011 11:51

## 2014-04-27 NOTE — Consult Note (Signed)
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 Biopsies taken.  Recommendations: CEA and CA 19-9. Repeat labs in am. Follow pathology report. ERCP may not be possible due to ampullary mass. Further recommendations depending on above mentioned tests and hospital course.   Electronic Signatures: Jill Side (MD)  (Signed 03-Apr-13 12:07)  Authored: Chief Complaint   Last Updated: 03-Apr-13 12:07 by Jill Side (MD)

## 2014-04-27 NOTE — H&P (Signed)
PATIENT NAME:  Katrina Kramer, Katrina Kramer MR#:  [id number] DATE OF BIRTH:  1940/11/29  DATE OF ADMISSION:  04/05/2011  CHIEF COMPLAINT/REASON FOR ADMISSION: Presented with refractory vomiting since yesterday, dehydration, weakness and abdominal pain with lipase greater than 3000 consistent with acute pancreatitis.   HISTORY OF PRESENT ILLNESS: The patient is a 74 year old female with past medical history significant for T2b/T3 N0 nonsmall-cell right upper lobe lung cancer, recently completed two cycles of Taxol and carboplatin chemotherapy, chronic obstructive pulmonary disease with history of smoking in the past, hypertension, iron deficiency anemia, diverticulosis, gastroesophageal reflux disease, history of gastric ulcers, EGD and colonoscopy in 2010, bronchoscopy January 2013 diagnosing nonsmall-cell carcinoma of right lung who was seen as outpatient follow-up at the [facility] yesterday. CT scan of the chest done after two cycles of chemotherapy had shown partial response with decrease in right upper lung mass. She was then seen by Dr. Genevive Bi who is planning repeat bronchoscopy to evaluate right upper lung cancer to see if surgery would be feasible. The patient, however, called and presented to [place] today feeling acutely sick, states that she has been vomiting since last night with significant upper abdominal pain going through to the back, severe weakness, unable to keep food or fluid down. She is actively throwing up in the [place] during evaluation, having severe pain issues in the abdomen. Lipase level returned greater than 3000. Denies any diarrhea, bright red blood in stools, melena, or hematemesis. No history of alcohol usage. Denies taking any new medications.   PAST MEDICAL/SURGICAL HISTORY: As in history of present illness above.   FAMILY HISTORY: Noncontributory.   SOCIAL HISTORY: Ex-smoker, quit a few years ago. Denies alcohol or recreational drug usage. Remains active and ambulatory.    ALLERGIES: No known drug allergies.   HOME MEDICATIONS:  1. Fergon 240 mg 1 tablet daily.  2. Folic acid 1 mg 1 tablet daily.  3. Metoprolol tartrate 25 mg b.i.d.  4. Multivitamin 1 capsule daily.  5. Phenergan 25 mg p.o. q.6 hours p.r.n. for nausea.  6. Zofran 4 mg q.4 hours p.r.n. for nausea. 7. Ramipril 2.5 mg p.o. daily. 8. Tussin cough syrup p.r.n. for severe cough.   REVIEW OF SYSTEMS: CONSTITUTIONAL: Feeling very weak and tired. No fever or chills. No night sweats. HEENT: Denies headaches, epistaxis, ear or jaw pain. No sinus symptoms. CARDIAC: No chest pain, palpitations, orthopnea, or PND. LUNGS: No new dyspnea on exertion. Has chronic cough. No sputum or hemoptysis. GI: As in history of present illness. GU: No dysuria or hematuria. SKIN: No new rashes or pruritus. HEMATOLOGIC: No obvious bleeding symptoms. MUSCULOSKELETAL: No new bone pains. Having abdominal pain going through to the back. HEMATOLOGIC: No obvious bleeding symptoms. NEUROLOGIC: No new focal weakness, seizures, or loss of consciousness. ENDOCRINE: No polyuria or polydipsia. Appetite is poor.   PHYSICAL EXAMINATION:   GENERAL: The patient is weak and tired looking, sitting in wheelchair, actively vomiting, in discomfort due to abdominal and adjacent back pain. Otherwise, no acute distress. Mildly icteric.   VITAL SIGNS: Temperature 97.7, pulse 84, respiratory rate 20, blood pressure 103/67, 96% on room air.   HEENT: Normocephalic, atraumatic. Extraocular movements intact. Sclerae anicteric. Mouth is dry. No thrush.   NECK: Supple without lymphadenopathy.  CARDIOVASCULAR: S1, S2, regular rate and rhythm.   LUNGS: Lungs show bilateral diminished breath sounds overall, slightly more decreased in right upper lobe area which is chronic finding. No rhonchi or crepitation.   ABDOMEN: Soft, epigastric tenderness present. No guarding  or rigidity. Bowel sounds present.   EXTREMITIES: No major edema or cyanosis.    SKIN: No generalized rashes or major bruising.   MUSCULOSKELETAL: No obvious joint deformity or swelling.   NEUROLOGIC: Limited examination. Cranial nerves are intact, moves all extremities spontaneously. Gait not checked.   LABORATORY, DIAGNOSTIC, AND RADIOLOGICAL DATA: WBC 8600, hemoglobin 8.6, platelets 178, ANC 7500, creatinine 1.24, potassium 2.9, calcium 8.4, glucose 213, lipase greater than 3000, bilirubin 5.0, alkaline phosphatase 286, SGPT 262, AST 328, protein 7.6, albumin 2.9. CK 45. Troponin-I less than 0.02. EKG reports sinus rhythm with short PR, sinus arrhythmia, unchanged compared to EKG of 2009.   IMPRESSION AND PLAN:  1. T2b/T3 N0 nonsmall-cell carcinoma of the right upper lung, recently completed two cycles of Taxol and carboplatin chemotherapy about three weeks ago. Follow-up CT scan showed partial response and is being evaluated for repeat bronchoscopy to look for feasibility of surgical resection versus continuing chemotherapy and radiation.  2. Currently admitted with severe vomiting, dehydration, abdominal and adjacent back pain. Lipase level is greater than 3000 consistent with acute pancreatitis causing these symptoms. Etiology of pancreatitis is not clear.   PLAN: 1. Admit to hospital. 2. Start on aggressive IV fluid hydration with half normal saline 125 mL an hour.  3. Continue to monitor renal function, electrolytes, and blood pressure.  4. Potassium is low. Will supplement and monitor.  5. Will keep n.p.o. except ice chips and medication.  6. Will consult GI for further recommendation and evaluation of acute pancreatitis of unknown etiology.  7. Morphine sulfate 2 to 4 mg IV q.1 to 2 hours p.r.n. for pain control.  8. Repeat lipase tomorrow along with liver functions, CBC, and MET-B.  9. Continue other home medications same as before including metoprolol, Zofran p.r.n., and Ramipril.   The patient and husband present explained above, they are agreeable to this  plan.   ____________________________ Rhett Bannister. Ma Hillock, MD srp:drc D: 04/06/2011 00:40:12 ET T: 04/06/2011 05:47:06 ET JOB#: [id number]  cc: Trevor Iha R. Ma Hillock, MD, <Dictator> Alveta Heimlich MD ELECTRONICALLY SIGNED 04/06/2011 8:16

## 2014-04-27 NOTE — Consult Note (Signed)
Pt seen and note dictated.  Acute pancreatitis with elevated LFT's with abrupt onset favors gall stone pancreatitis with cholangitis.  She is doing very well from pain standpoint and she may have passed the stone.  Will hydrate well.  Plan CT scan of abd today.  If she has gall stone in [place] may need transfer as Dr. Candace Cruise and Dr. Dionne Milo  are out of town. Will follow with you.  Electronic Signatures: Manya Silvas (MD)  (Signed on 02-Apr-13 17:57)  Authored  Last Updated: 02-Apr-13 17:57 by Manya Silvas (MD)

## 2014-05-04 NOTE — H&P (Signed)
PATIENT NAME:  Katrina Kramer, Katrina Kramer MR#:  [id number] DATE OF BIRTH:  Dec 14, 1940  DATE OF ADMISSION:  02/20/2014  PRIMARY CARE PHYSICIAN:  Enid Derry, MD   CHIEF COMPLAINT:  Not feeling well.   HISTORY OF PRESENT ILLNESS:  History is obtained from the patient's husband, son, and ER physician. The patient currently is intubated on the ventilator, unable to give any history or review of systems.   Katrina Kramer is a 74 year old African-American female who was recently admitted and discharged February 7 after she was diagnosed with rapid atrial fibrillation and sent home on amiodarone and Coreg along with Eliquis, which was started by Dr. Neoma Laming. According to the husband, the patient was not feeling too well since discharge and was having on-and-off shortness of breath with cough and came to the Emergency Room today because she was hallucinating, confused, and not eating. She was brought here, where she was found to have a hemoglobin of 3.5 and was very hypotensive and hypothermic with core temperature around 94. The patient was unable to stay awake and was slumping over in the Emergency Room. To protect her airway, she was intubated and placed on a ventilator. The patient was found to have some blood in the rectal vault while measuring rectal temperature. She did not have any bright red blood per rectum per husband. She is currently getting first unit of IV blood transfusion. She is also getting Kcentra for now. She received some IV fluids initially. She was found to have a lactic acid of 9.5. Her creatinine was 3.24 with baseline of 1.36. Her hemoglobin was 3.5 with baseline hemoglobin of 7.9 on 02/09/2014.   PAST MEDICAL HISTORY: 1.  Atrial fibrillation recently diagnosed and started on Eliquis on February 7.  2.  Cardiomyopathy with EF of 35 to 45% by echocardiogram.  3.  Chronic iron deficiency anemia.  4.  Gastroesophageal reflux disease.  5.  COPD.  6.  History of pancreatitis.  7.  Lung cancer,  non-small cell, follows with Dr. Ma Hillock.  8.  Tonsillectomy.  9.  EGD. She has had multiple EGDs in the past; most recent/last one was done in 2013 and showed a duodenal mass, normal stomach, and normal esophagus. She was then transferred to a tertiary care center for further workup. I do not have the details about her duodenal mass.  10.  Colonoscopy. Last one was done in 2010 and showed internal hemorrhoids, nonbleeding, and the possibility of small-bowel AVMs was noted in the colonoscopy report.    ALLERGIES:  No known drug allergies.   MEDICATIONS: 1.  ProAir HFA 2 puffs 4 times a day as needed.  2.  Prednisone 2.5 mg p.o. daily.  3.  Levothyroxine 25 mcg p.o. daily.  4.  Lasix 20 mg once a day.  5.  Folic acid 1 mg daily.  6.  Ferate  1 tablet 3 times a day.  7.  Cheratussin 10 mL every 4 hourly as needed.  8.  Cetirizine 10 mg b.i.d.  9.  Carvedilol 6.25 b.i.d.  10.  Tessalon Perles 1 capsule t.i.d.  11.  Eliquis 5 mg b.i.d.  12.  Amiodarone 400 mg b.i.d.  13.  Advair 250/50 one puff b.i.d.   FAMILY HISTORY:  Obtained from old records. Mother died of cancer of unknown primary. Uncle had lung cancer.   SOCIAL HISTORY:  From old records, previous smoker. No alcohol use. She lives with her husband.   PAST SURGICAL HISTORY:  She had tonsillectomy and tubal  ligation.   REVIEW OF SYSTEMS:  Unobtainable since the patient is intubated on a ventilator.   PHYSICAL EXAMINATION: GENERAL:  The patient is critically ill and intubated on a ventilator. She has overall generalized pallor present.  VITAL SIGNS:  Her core temperature was 94.4. Pulse is 65. Blood pressure is 92/45. Saturation is 100% on current ventilator setting.  HEENT:  Atraumatic, normocephalic. PERRLA. EOM intact. Oral mucosa is dry.  NECK:  Supple. No JVD. No carotid bruit.  LUNGS:  Clear to auscultation bilaterally. No rales, rhonchi, respiratory distress, or labored breathing.  HEART:  Both of the heart sounds are  normal. No murmur heard. PMI is not lateralized.  CHEST:  Nontender. The patient currently is intubated on a ventilator.  ABDOMEN:  Soft. No bowel sounds heard. No organomegaly felt.  NEUROLOGIC:  Unable to assess. The patient is intubated on a ventilator.  SKIN:  Warm and dry. Generalized pallor present. She has some 2+ pitting edema in both lower extremities. Cannot appreciate much pedal pulses due to pitting edema. Good femoral pulses.   LABORATORY DATA:  Hemoglobin and hematocrit are 3.5 and 11.5. Platelet count is 210,000. Sodium is 132, potassium 4.5, chloride 97, bicarbonate 17, creatinine 3.24, BUN 53, glucose is 174. Urinalysis is negative for UTI. Lactic acid is 9.5. PH is 7.45, pCO2 is 19, and pO2 is 86% on room air. Troponin first set is 0.02. Magnesium is 2.4. Phosphorus is 4.5. PT/INR is 32.7 and 3.2. LFTs are within normal limits.   Chest x-ray shows chronic right lung changes, moderate cardiomegaly, and no acute cardiopulmonary findings.   ASSESSMENT AND PLAN:  Katrina Kramer is a 74 year old with multiple medical problems including iron deficiency anemia, chronic, along with hypertension and new-onset atrial fibrillation recently diagnosed and started on Eliquis, who comes in with:   1.  Hypovolemic shock. The patient was found to be hypothermic with temperature of 94 and hypotensive with severe anemia and hemoglobin of 3.5. Appears to be post hemorrhagic anemia, likely GI bleed since some bright red blood was found in the rectal vault. The patient was recently started on Eliquis for her atrial fibrillation. There is some question of possible history of small-bowel AVMs along with history of duodenitis and duodenal mass in the past. She has had multiple EGDs and colonoscopies; last done was in 2013. We will admit the patient to the intensive care unit and give her volume with IV fluids and Kcentra to hopefully help reverse her anticoagulation along with 2 units of blood transfusion. I spoke  with Dr. Lucilla Lame about the patient.  We will follow up hemoglobin closely q. 8 hourly and transfuse as needed.  2.  Hypothermia with possible sepsis. No source identified as of yet. Chest x-ray and UA look okay. This could very well be secondary to volume depletion and hypovolemic shock. However, given the patient being recently in the hospital and her clinical presentation, we will cover her with broad-spectrum antibiotics with vancomycin and Zosyn. Once cultures are negative and her white count and fever subside, de-escalate antibiotics and consider discontinuing if no source of infection identified.  3.  Acute respiratory failure secondary to the patient's hypovolemic shock. The patient was intubated to protect her airways. Pulmonary consultation has been placed for ventilator management.  4.  Acute-on-chronic renal failure, chronic kidney disease stage III. The patient's creatinine at baseline is 1.3. She has come in with creatinine of 3.2, likely due to GI bleed versus hypovolemic shock. We will give her IV  fluids, packed cells, monitor intake/output, avoid nephrotoxins, and have nephrology see the patient.  5.  Gastrointestinal prophylaxis. IV PPI b.i.d.  6.  Deep vein thrombosis prophylaxis. TEDs and SCDs.  7.  History of recent atrial fibrillation with rapid ventricular response. Started on Eliquis by Dr. Humphrey Rolls. She has cardiomyopathy with EF of 35%. We will have cardiology on-board as well. We will hold off on all cardiac medications including Eliquis for now.  8.  History of chronic iron deficiency anemia.  9.  History of lung cancer. Follows with Dr. Ma Hillock at the cancer center.  10.  Further workup according to the patient's clinical course.   CODE STATUS:  The patient is a full code. Recommend palliative care consultation if the patient continues to not show any improvement.   The above was discussed with the patient, son, daughter, and the patient's husband. They want aggressive medical  management at this time.   CRITICAL TIME SPENT:  60 minutes.   ____________________________ Hart Rochester Posey Pronto, MD sap:nb D: 02/20/2014 22:53:03 ET T: 02/20/2014 23:43:39 ET JOB#: [id number]  cc: Shaterria Sager A. Posey Pronto, MD, <Dictator> Enid Derry, MD Ilda Basset MD ELECTRONICALLY SIGNED 03/04/2014 17:37

## 2014-05-04 NOTE — H&P (Signed)
PATIENT NAME:  Katrina Kramer, Katrina Kramer MR#:  [id number] DATE OF BIRTH:  11-Jul-1940  DATE OF ADMISSION:  02/05/2014  PRIMARY CARE PHYSICIAN:  Dr. Jeananne Rama,  CHIEF COMPLAINT: Atrial fibrillation with RVR.   HISTORY OF PRESENT ILLNESS: A 74 year old female, went to her primary doctor for followup and then she was found to have atrial fibrillation with RVR with heart rate up to 130 beats per minute. The patient's EKG done at primary doctor's office showed atrial fibrillation with RVR with 130 beats per minute. So she was sent in here. The patient is completely asymptomatic. She denies any chest pain. No trouble breathing. No palpitations. The patient is recovered and has history of non-small cell carcinoma of the lung, she is in remission now. The patient denies any dizziness or recent sickness. She does have some cough on and off and she is on small dose prednisone daily.   PAST MEDICAL HISTORY: Significant for history of non-small cell cancer of the lung, follows up with Dr. Raul Del and also Dr. Ma Hillock. The patient has history of chemotherapy and radiation before. She does have a history of chronic dyspnea on exertion which is not new for her.  Significant for COPD, ex-smoker, hypertension, iron deficiency anemia, diverticulosis, GERD, history of gastric ulcers before.   FAMILY HISTORY: The patient's mother died of cancer unknown primary. Uncle had lung cancer.   SOCIAL HISTORY: Previous smoker, smoked about 15 years half pack per day, quit 4 years ago. No alcohol. No drugs. Lives with her husband.   PAST SURGICAL HISTORY:  No history of operations.  She received chemotherapy with paclitaxel and carboplatin and also received radiation.   MEDICATIONS AT HOME:  Advair Diskus 250/50 one puff b.i.d., Tessalon Perles 100 mg p.o. b.i.d., cetirizine 10 mg p.o. b.i.d., Cheratussin AC 10/100, 5 mL as needed every 4 hours, ferrate 256 mg p.o. t.i.d., folic acid 1 mg p.o. daily, prednisone 5 mg half tablet once a day,  ProAir 90 mcg 2 puffs as needed for trouble breathing.   ALLERGIES: No known allergies.    REVIEW OF SYSTEMS:  CONSTITUTIONAL:  The patient denies any fever. No fatigue. No weakness.  EYES: No blurred vision. No pain. No cataracts.   EARS, NOSE, AND THROAT:  No tinnitus. No ear pain. No epistaxis. No difficulty swallowing.  RESPIRATORY:  Has some chronic cough. Denies any wheezing.  CARDIOVASCULAR: Denies any chest pain. No orthopnea. No PND. No pedal edema. The patient has chronic shortness of breath and dyspnea on exertion.  GASTROINTESTINAL: No nausea. No vomiting. No diarrhea. No abdominal pain.  GENITOURINARY: No dysuria or hematuria.  ENDOCRINE: No polyphagia or nocturia.  HEMATOLOGIC: No anemia or easy bruising.  INTEGUMENTARY: No skin rashes.  MUSCULOSKELETAL: No joint pains. No weakness. No arthralgia. No TIAs, no CVAs. PSYCHIATRIC:  No anxiety or insomnia.   PHYSICAL EXAMINATION:  VITAL SIGNS: Temperature 98.9, heart rate 122, blood pressure is 135/70, saturation is 98% on room air.  GENERAL:  She is alert, awake, oriented, well-developed female, not in distress.  HEAD: Normocephalic, atraumatic.  EYES: Pupils equal, reacting to light. No conjunctival pallor.  No scleral icterus, extraocular movements are intact.  NOSE: No nasal lesions.  No drainage.   EARS:  No drainage.  No external lesions.   MOUTH:  No lesions.  No exudates.   NECK: Supple. No JVD. No carotid bruit. Normal range of motion.  RESPIRATORY: Bilateral breath sounds present.  Clear to auscultation. No wheeze. No rales.  CARDIOVASCULAR: S1, S2 irregularly irregular, atrial  fibrillation with RVR, rate of 100 beats per minute.  GASTROINTESTINAL: Abdomen is soft, nontender, nondistended. Bowel sounds present. The patient has no organomegaly. No hernias.  MUSCULOSKELETAL: Normal gait and station. No (cyanosis of clubbing  of digits or nails.  EXTREMITIES: Move x 4. No tenderness or effusion. The patient's strength  and tone are equal bilaterally.  SKIN: Inspection is within normal limits. Well-hydrated.   LYMPHATIC:  No lymphadenopathy in cervical or axillary regions.  VASCULAR: Good pedal pulses.  NEUROLOGICAL: Cranial nerves II through XII intact.  Power 5 out of 5 in upper and lower extremities.  Sensory intact.  DTRs 2 + bilaterally.   PSYCHIATRIC: Mood and affect are within normal limits.   LABORATORY DATA: WBC 5.2, hemoglobin 9.1, hematocrit 29.6, platelets 232,000. Electrolytes, sodium is 135, potassium 3.9, chloride 103, bicarbonate 25, BUN 22, creatinine 1.54, glucose 123. The patient's troponin less than 0.02. EKG shows atrial fibrillation with RVR, 124 beats per minute.   ASSESSMENT AND PLAN:  1.  The patient is a 74 year old female patient who comes in with new onset atrial fibrillation with rapid ventricular rate.  The patient is admitted to telemetry.  Right now the patient received 20 mg of Cardizem so far in the ER. Heart rate is varying between 90-100. So I spoke with Dr. Neoma Laming who is on call for cardiology. He recommended to start loading dose of amiodarone 800 mg p.o. daily, and use p.r.n. metoprolol 2.5 mg q. 6 hours for heart rate more than 100. Check TSH T4 and also echocardiogram. Cardiology consult with Dr. Neoma Laming.  2.  History of lung cancer. She has a history of non-small cell cancer and according to Dr. Beverly Gust note . The patient is following up with him.  The patient received chemotherapy and radiation for that and she is now having surveillance.  She will follow up with him as needed.  3.  Chronic anemia. She takes B12, she has a port, and follows up with Dr. Ma Hillock as needed.  4.  Chronic obstructive pulmonary disease, chronic, no wheezing at this time. Does have chronic cough. She uses prednisone, Advair, and also ProAir, restarted them.   5.  For atrial fibrillation her CHADS-VASc score is only 1.  The patient has a history of cancer, so we started her on full dose of  Lovenox.    Condition discussed with the patient and family and also call Dr. Neoma Laming for cardiology consult.    TIME SPENT:  More than 50 minutes.     ____________________________ Epifanio Lesches, MD sk:bu D: 02/05/2014 20:31:13 ET T: 02/05/2014 21:06:01 ET JOB#: [id number]  cc: Epifanio Lesches, MD, <Dictator> Epifanio Lesches MD ELECTRONICALLY SIGNED 02/08/2014 13:09

## 2014-05-04 NOTE — Consult Note (Signed)
Pt seen and examined. Please see Katrina Kramer' notes. Just extubated. Not able to provide much hx. However, husband mentioned her having more GERD sxs, with signif belching recently. No abdominal pain now. Knownhx of gastric ulcers in the past. 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 Results unknown. Getting blood transfusion. On pressors. Eliquis being reversed. Pt does need repeat EGD but NOT until breathing more stable and patient is off pressors. Will tentatively plan EGD by Monday if patient continues to improve. thanks.  Electronic Signatures: Verdie Shire (MD)  (Signed on 19-Feb-16 14:50)  Authored  Last Updated: 19-Feb-16 14:50 by Verdie Shire (MD)

## 2014-05-04 NOTE — Consult Note (Signed)
Chief Complaint:  Subjective/Chief Complaint Off O2. Breathing better. Cr improving. Hgb steady. No further bleeding. Still on pressors.   VITAL SIGNS/ANCILLARY NOTES: **Vital Signs.:   20-Feb-16 10:00  Vital Signs Type Routine  Pulse Pulse 80  Respirations Respirations 16  Systolic BP Systolic BP 623  Diastolic BP (mmHg) Diastolic BP (mmHg) 48  Mean BP 71  Pulse Ox % Pulse Ox % 99  Pulse Ox Activity Level  At rest  Oxygen Delivery Room Air/ 21 %   Brief Assessment:  GEN no acute distress   Cardiac Regular   Respiratory normal resp effort  clear BS   Gastrointestinal Normal   Lab Results: LabObservation:  19-Feb-16 11:55   OBSERVATION Reason for Test  Cardiology:  19-Feb-16 11:55   Echo Doppler REASON FOR EXAM:     COMMENTS:     PROCEDURE: [facility] - ECHO DOPPLER COMPLETE(TRANSTHOR)  - Feb 21 2014 11:55AM   RESULT: Echocardiogram Report  Patient Name:   Katrina Kramer Date of Exam: 02/21/2014 Medical Rec #:  [id number]        Custom1: Date ofBirth:  1940/08/20     Height:       62.0 in Patient Age:    74 years      Weight:       190.0 lb Patient Gender: F             BSA:          1.87 m??  Indications: Atrial Fib Sonographer:    Sherrie Sport RDCS Referring Phys: Fritzi Mandes, A  Sonographer Comments: Echo performed with patient supine and on  artificial respirator and Off axis views.  Summary:  1. Left ventricular ejection fraction, by visual estimation, is 55 to  60%.  2. Elevated left atrial and left ventricular end-diastolic pressures.  3. Restrictive pattern of LV diastolic filling.  4. Moderately enlarged right ventricle.  5. Mildly dilated left atrium.  6. Moderately dilated right atrium.  7. Mild mitral valve regurgitation.  8. Mild aortic valve sclerosis without stenosis.  9. Moderate tricuspid regurgitation. 10. Moderately elevated pulmonary artery systolic pressure. 11. Mildly increased left ventricular posterior wall thickness. 2D AND M-MODE MEASUREMENTS  (normal ranges within parentheses): LeftVentricle:          Normal IVSd (2D):      0.94 cm (0.7-1.1) LVPWd (2D):     1.12 cm (0.7-1.1) Aorta/LA:                  Normal LVIDd (2D):     3.72 cm (3.4-5.7) Aortic Root (2D): 2.30 cm (2.4-3.7) LVIDs (2D):     2.68 cm           Left Atrium (2D): 3.80 cm (1.9-4.0) LV FS (2D):     28.0 %   (>25%) LV EF (2D):     55.0 %   (>50%)                                   Right Ventricle:                                   RVd (2D):        5.17 cm LV DIASTOLIC FUNCTION: MV Peak E: 1.21 m/s E/e' Ratio: 12.70 MV Peak A: 0.86 m/s Decel Time: 229 msec E/A Ratio: 1.40 SPECTRAL DOPPLER ANALYSIS (where  applicable): Mitral Valve: MV P1/2 Time: 66.41 msec MV Area, PHT: 3.31 cm?? Aortic Valve: AoV Max Vel: 1.23 m/s AoV Peak PG: 6.0 mmHg AoV MeanPG: LVOT Vmax: 0.82 m/s LVOT VTI:  LVOT Diameter: 2.00 cm AoV Area, Vmax: 2.10 cm?? AoV Area, VTI:  AoV Area, Vmn: Tricuspid Valve and PA/RV Systolic Pressure: TR Max Velocity: 3.58 m/s RA  Pressure: 10 mmHg RVSP/PASP: 61.3 mmHg Pulmonic Valve: PV Max Velocity: 0.87 m/s PV Max PG: 3.0 mmHg PV Mean PG:  PHYSICIAN INTERPRETATION: Left Ventricle: The left ventricular internal cavity size was normal. LV  posterior wall thickness was mildly increased. Left ventricular ejection  fraction, by visual estimation, is 55 to 60%. Spectral Doppler shows  restrictive pattern of LV diastolic filling. Elevated left atrial and  left ventricular end-diastolic pressures. Right Ventricle: The right ventricular size is moderately enlarged. Left Atrium: The left atrium is mildly dilated. Right Atrium: The right atrium is moderately dilated. Mitral Valve: Mild mitral valve regurgitation is seen. Tricuspid Valve: Moderate tricuspid regurgitation is visualized. The  tricuspid regurgitant velocity is 3.58 m/s, and with an assumed right  atrial pressure of 10 mmHg, the estimated right ventricular systolic  pressure is moderately elevated at  61.3 mmHg. Aortic Valve: The aortic valve is tricuspid. Mild aortic valve sclerosis  is present, with no evidence of aortic valve stenosis. No evidence of   aortic valve regurgitation is seen.  Stollings MD Electronically signed by [id number] Neoma Laming MD Signature Date/Time: 02/21/2014/4:02:41 PM  *** Final ***  IMPRESSION: .    Verified By: Emmit Pomfret. Humphrey Rolls, M.D., MD   Assessment/Plan:  Assessment/Plan:  Assessment GI bleeding. Stopped. Off eliquis.   Plan Will recheck INR tomorrw since eliquis reversed. EGD Monday afternoon if off pressors. thanks   Electronic Signatures: Verdie Shire (MD)  (Signed [phone] 11:21)  Authored: Chief Complaint, VITAL SIGNS/ANCILLARY NOTES, Brief Assessment, Lab Results, Assessment/Plan   Last Updated: 20-Feb-16 11:21 by Verdie Shire (MD)

## 2014-05-04 NOTE — Discharge Summary (Signed)
PATIENT NAME:  Katrina Kramer, Katrina Kramer MR#:  [id number] DATE OF BIRTH:  20-Aug-1940  DATE OF ADMISSION:  02/20/2014 DATE OF DISCHARGE:  02/27/2014  PRESENTING COMPLAINT: Not feeling well.  DISCHARGE DIAGNOSES:  1. Acute respiratory failure secondary to severe gastrointestinal bleed. The patient was intubated, extubated, now going well.  2. 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 The patient was on Eliquis, now discontinued.  3. Atrial fibrillation with rapid ventricular response, resolved.  4. Anemia of chronic disease.  5. Chronic kidney disease stage III.  6. Gastroesophageal reflux disease.  7. Esophagogastroduodenoscopy done by Dr. Candace Cruise showed arteriovenous malformations of the stomach, status post argon laser treatment.   CODE STATUS: Full code.   MEDICATIONS:  1. Benzonatate 100 mg t.i.d. as p.r.n.  2. Advair Diskus 250/50 one puff b.i.d.  3. Cheratussin 10 mL every 4 hours as needed.  4. Prednisone 5 mg 1/2 tablet daily.  5. Ferate 256 mg p.o. 3 times a day.  6. Folic acid 1 mg daily.  7. ProAir HFA 90 mcg 2 puffs 4 times a day as needed.  8. Cetirizine 10 mg p.o. b.i.d.  9. Levothyroxine 25 mcg p.o. daily.  10. Coreg 12.5 mg p.o. b.i.d.  11. Amiodarone 200 mg p.o. daily.  12. Aspirin 81 mg daily.  13. Protonix 40 mg b.i.d.   FOLLOWUP: With Dr. Neoma Laming in 2 weeks. Follow up with Dr. Sanda Klein in 2 weeks.  Follow up with Dr. Candace Cruise in 2 to 4 weeks.   DIET: Mechanical soft diet.   DIAGNOSTIC DATA: Hemoglobin at discharge is 6.8. We will give 1 unit of blood transfusion prior to that. Creatinine is 1.40. Sodium is 145, potassium is 3.9. Hemoglobin A1c is 5.8.   GASTROENTEROLOGY CONSULTATION: Dr. Candace Cruise   NEPHROLOGY CONSULTATION: Dr. Candiss Norse.   CARDIOLOGY CONSULTATION: Dr. Humphrey Rolls.   BRIEF SUMMARY OF HOSPITAL COURSE: Ms. Crumpler is 74 year old African American female with history of CKD stage III,  high blood pressure, anemia of chronic disease, iron deficiency, chronic small intestinal AVMs, who was admitted with:   1. Acute respiratory failure, intubated for airway protection. Chest x-ray showed right upper lobe opacity from chronic lung changes. She has history of chronic obstructive pulmonary disease. Continued inhalers. She was seen by Dr. Mortimer Fries. The patient was successfully extubated on 02/21/2014. She has been put back on her inhalers and her saturations, as well as her chronic obstructive pulmonary disease, is stable.  2. Acute post hemorrhagic anemia secondary to gastrointestinal bleed, likely from the arteriovenous malformations in the setting of patient being recently started on p.o. Eliquis as part of anticoagulation from her chronic atrial fibrillation. Eliquis was discontinued. She received intravenous Kcentra to reverse her anticoagulation. She is status post 3 units of blood transfusion. Hemoglobin on 02/25/2014 was 7.8. No signs of gastrointestinal bleed. Her hemoglobin today is 6.8.  We are going to re-draw it to see if that is true, and then we may consider giving her another unit. Gastroenterology consultation was placed with Dr. Candace Cruise, who did an esophagogastroduodenoscopy, which showed multiple bleeding arteriovenous malformations, treated with argon laser. Continue Protonix b.i.d. The patient is placed back on low-dose aspirin as part of her maximum anticoagulation that could be given. Gastroenterology is okay with that.  3. Hypotension with hypovolemic shock secondary to gastrointestinal bleed off Levophed and off stress dose of steroids. She also received some stress doses of steroids. Her blood pressure is stable.  4.  Acute on chronic renal failure from acute tubular necrosis due to gastrointestinal bleed. Hypotension improved. A renal ultrasound shows chronic changes only. Her creatinine was 3.24, down to 1.4 at discharge.  5. Chronic obstructive pulmonary disease stable with  inhalers.  6. Chronic systolic congestive heart failure, well compensated. Lasix is on hold at present because of all her acute events in the hospital. It could be resumed as outpatient.  7. Chronic atrial fibrillation, now in sinus rhythm with intermittent atrial fibrillation off Eliquis. On amiodarone and Coreg. Dr. Humphrey Rolls recommends continue a small dose aspirin as part of her maximum anticoagulation that can be given in the setting of gastrointestinal bleed.  8. Physical therapy recommended rehabilitation. The patient is being sent to [facility].   TIME SPENT: 40 minutes.   DISCHARGE MEDICATIONS:  1. Ferrous sulfate 325 mg p.o. b.i.d.  2. Synthroid 0.25 mg p.o. daily.  3. Folic acid 1 mg daily.  4. Amiodarone 200 mg daily.  5. Tylenol 325 mg p.o. every 4 hours p.r.n.  6. Protonix 40 mg b.i.d.  7. Carvedilol 12.5 mg b.i.d.  8. Fluticasone 250/50 one puff b.i.d.  9. Benzonatate 100 mg p.o. every 6 hours p.r.n.  10. Aspirin 81 mg daily.   The discharge plan was discussed with the patient's son.  ____________________________ Hart Rochester. Posey Pronto, MD sap:ap D: 02/27/2014 09:57:15 ET T: 02/27/2014 11:14:00 ET JOB#: [id number]  cc: Adian Jablonowski A. Posey Pronto, MD, <Dictator> Ilda Basset MD ELECTRONICALLY SIGNED 03/04/2014 17:37

## 2014-05-04 NOTE — Consult Note (Signed)
PATIENT NAME:  Katrina Kramer, MCAULAY MR#:  [id number] DATE OF BIRTH:  [birth year]  DATE OF CONSULTATION:  02/06/2014  CONSULTING PHYSICIAN:  Dionisio David, MD   INDICATION FOR CONSULTATION: Atrial fibrillation.   HISTORY OF PRESENT ILLNESS: This is a 74 year old African American female with a past medical history of lung cancer in remission, history of hypertension. No history of hyperlipidemia.  No history of prior heart disease, who presented to the Emergency Room from her primary doctor because of elevated heart rate. She apparently was in atrial fibrillation when she came to the Emergency Room, but her rate was well controlled.  She was admitted to telemetry.  I was asked to evaluate the patient. The patient denies any chest pain or shortness of breath. No PND, orthopnea, no palpitation.   SOCIAL HISTORY: No history of EtOH abuse. No history of smoking.   FAMILY HISTORY: Also was unremarkable.   PHYSICAL EXAMINATION:  GENERAL: She is alert and oriented x 3, in no acute distress.  VITAL SIGNS: Her blood pressure is on the low side 91/61, respirations 21, pulse is 95, temperature was 98.4, saturation 98%.  NECK: No JVD.  LUNGS: Good air entry. No rales or rhonchi.   HEART: Irregularly irregular. Normal S1, S2. No audible murmur.  ABDOMEN: Soft, nontender, positive bowel sounds.  EXTREMITIES: No pedal edema.  NEUROLOGIC: She appears to be intact.   DIAGNOSTIC STUDIES: Her EKG shows atrial fibrillation with rapid ventricular response rate of 124, nonspecific ST-T changes.  Apparently, there is no labs done yet. We will put an order in for the labs.   ASSESSMENT AND PLAN: Atrial fibrillation with rapid ventricular response rate.  Right now, the rate is well controlled.  Advised putting the patient on IV amiodarone, switching from p.o. to IV.  I also advised the patient to be put on Eliquis and discontinue subcutaneous Lovenox.  Continue metoprolol and we will start the patient on IV amiodarone.   Also, we will get an echocardiogram to evaluate ejection fraction.   Thank you very much for the referral.    ____________________________ Dionisio David, MD sak:DT D: 02/06/2014 08:47:00 ET T: 02/06/2014 09:55:28 ET JOB#: [id number]  cc: Dionisio David, MD, <Dictator> Dionisio David MD ELECTRONICALLY SIGNED 03/06/2014 12:15

## 2014-05-04 NOTE — Consult Note (Signed)
PATIENT NAME:  Katrina Kramer, Katrina Kramer MR#:  [id number] DATE OF BIRTH:  20-Aug-1940  DATE OF CONSULTATION:  02/21/2014  REFERRING PHYSICIAN:   CONSULTING PHYSICIAN:  Dionisio David, MD  INDICATION FOR CONSULTATION: Respiratory failure.   HISTORY OF PRESENT ILLNESS: This is a 74 year old African American female who was recently admitted and discharged on February 7, was diagnosed with atrial fibrillation, was on amiodarone, Coreg and lipase. The patient apparently was not feeling well and came to the hospital. Apparently was very short of breath and was coughing, hallucinating, and was confused. In the Emergency Room, her hemoglobin was 3.5. She was hypotensive, hypothermic, and temperature was 94. She was intubated and put on a ventilator. She was given blood transfusion, IV fluid. Lactic acid was 9.5. Creatinine was 3.4 with a baseline of 1.36. Her hemoglobin was 7.9 upon discharge on 02/09/2014.   PAST MEDICAL HISTORY: As mentioned, history of atrial fibrillation, was started on Eliquis February 7, history of cardiomyopathy with ejection fraction of 35% to 45% on echocardiogram, history for GI reflux, COPD, pancreatitis, lung cancer, being followed by Dr. Ma Hillock. 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 Tonsillectomy was done in the past. She also had EGD in 2013, which showed normal stomach, normal esophagus. Colonoscopy was done in 2010 which showed internal hemorrhoids.   ALLERGIES: None.   HOME MEDICATIONS: Prednisone, carvedilol 6.25 b.i.d., amiodarone 400 b.i.d., Eliquis 500 b.i.d. 5 mg.   SOCIAL HISTORY: Unremarkable.   PHYSICAL EXAMINATION: GENERAL: She is alert and oriented x 0. She is intubated and sedated.  VITALS: Blood pressure right now is 97/44, respirations 14, pulse 73, saturation is 96.  NECK: Positive JVD.  LUNGS: Good air entry. No rales or rhonchi.  HEART: Regular rate and rhythm. Normal S1, S2. No audible murmur.  ABDOMEN: Soft. Slight tenderness in the epigastrium.   EXTREMITIES: No pedal edema.  NEUROLOGIC: She appears to be intact.   LABORATORY DATA: EKG shows normal sinus rhythm, 63 beats per minute, nonspecific ST-T changes. BUN is 53, creatinine is 3.24. Troponins are negative. SGPT is 10, SGOT is 15. Second set of troponins is negative. Second set of BUN and creatinine is 49/2.87. Second set of hemoglobin is 6.7, white count 6.2, platelet count is 186,000.   ASSESSMENT AND PLAN: The patient has unfortunately a GI bleed secondary to Eliquis. She was taking Eliquis because of atrial fibrillation. The patient hemodynamically is stable right now. Hemoglobin is improving. She remains intubated. MI has been ruled out. Advise getting another echocardiogram today and we will follow the patient closely with you. Thank you very much for the referral.    ____________________________ Dionisio David, MD sak:at D: 02/21/2014 08:57:42 ET T: 02/21/2014 09:18:34 ET JOB#: [id number]  cc: Dionisio David, MD, <Dictator> Dionisio David MD ELECTRONICALLY SIGNED 03/06/2014 12:18

## 2014-05-04 NOTE — Consult Note (Signed)
Chief Complaint:  Subjective/Chief Complaint Off pressors now since yesterday. No abdominal pain. Still some rectal bleeding. INR at 1.7.   Brief Assessment:  GEN no acute distress   Cardiac Regular   Respiratory clear BS   Gastrointestinal Normal   Lab Results: Routine Chem:  21-Feb-16 05:09   Glucose, Serum  111  BUN  33  Creatinine (comp)  1.87  Sodium, Serum 138  Potassium, Serum 3.9  Chloride, Serum  109  CO2, Serum  20  Calcium (Total), Serum  7.8  Anion Gap 9  Osmolality (calc) 284  eGFR (African American)  34  eGFR (Non-African American)  28 (eGFR values <88m/min/1.73 m2 may be an indication of chronic kidney disease (CKD). Calculated eGFR, using the MRDR Study equation, is useful in  patients with stable renal function. The eGFR calculation will not be reliable in acutely ill patients when serum creatinine is changing rapidly. It is not useful in patients on dialysis. The eGFR calculation may not be applicable to patients at the low and high extremes of body sizes, pregnant women, and vegetarians.)  Routine Coag:  21-Feb-16 05:09   Prothrombin  20.4 (11.4-15.0 NOTE: New Reference Range  01/31/14)  INR 1.7 (INR reference interval applies to patients on anticoagulant therapy. A single INR therapeutic range for coumarins is not optimal for all indications; however, the suggested range for most indications is 2.0 - 3.0. Exceptions to the INR Reference Range may include: Prosthetic heart valves, acute myocardial infarction, prevention of myocardial infarction, and combinations of aspirin and anticoagulant. The need for a higher or lower target INR must be assessed individually. Reference: The Pharmacology and Management of the Vitamin K  antagonists: the seventh ACCP Conference on Antithrombotic and Thrombolytic Therapy. CWUJWJ.1914Sept:126 (3suppl): [id number] A HCT value >55% may artifactually increase the PT.  In one study,  the increase was an average of  25%. Reference:  "Effect on Routine and Special Coagulation Testing Values of Citrate Anticoagulant Adjustment in Patients with High HCT Values." American Journal of Clinical Pathology 27829;562:130-865)  Routine Hem:  21-Feb-16 05:09   Hemoglobin (CBC)  7.4 (Result(s) reported on 23 Feb 2014 at 05:55AM.)   Assessment/Plan:  Assessment/Plan:  Assessment GI bleed. Off eliquis. Hx of PUD.   Plan PLan EGD tomorrow afternoon. NPO after MN. Would like INR to be down further.   Electronic Signatures: OVerdie Shire(MD)  (Signed 21-Feb-16 10:04)  Authored: Chief Complaint, Brief Assessment, Lab Results, Assessment/Plan   Last Updated: 21-Feb-16 10:04 by OVerdie Shire(MD)

## 2014-05-04 NOTE — Discharge Summary (Signed)
PATIENT NAME:  Katrina Kramer, KABEL MR#:  [id number] DATE OF BIRTH:  1940-11-02  DATE OF ADMISSION:  02/05/2014 DATE OF DISCHARGE:  02/09/2014  PRIMARY DOCTOR: Enid Derry, MD  DISCHARGE DIAGNOSES:  1.  Atrial fibrillation with rapid ventricular response.  2.  History of lung cancer in remission.  3.  Chronic obstructive pulmonary disease.  4.  Hypothyroidism.   DISCHARGE MEDICATIONS:  1.  Tessalon Perles 100 mg p.o. t.i.d. as needed for cough.  2.  Advair Diskus 250/50 one puff b.i.d.  3.  Cheratussin as needed for cough 10 mL every 4 hours.  4.  Prednisone 5 mg 1/2 tablet daily.  5.  Ferate 256 mg p.o. t.i.d.  6.  Folic acid 1 mg p.o. daily.  7.  ProAir 90 mcg 2 puffs every 6 hours as needed.  8  9.  Amiodarone 400 mg p.o. b.i.d.  10.  Eliquis 5 mg twice daily.  11.  Levothyroxine 25 mcg p.o. daily. 12.  Coreg 6.25 mg p.o. b.i.d.  13.  Furosemide 20 mg p.o. daily. The patient's Coreg, levothyroxine, apixaban, amiodarone, and furosemide are new medicines. Advised to use Lasix for 2-3 days and then stop.  CONSULTATIONS: Cardiology consult with Dr. Neoma Laming.    HOSPITAL COURSE:  1.  This is a 74 year old female patient with history of lung cancer in remission brought in because of elevated heart rate. The patient went to primary doctor for followup and told to have atrial fibrillation with RVR with heart rate of 140 beats per minute. The patient was asymptomatic. No chest pain. No shortness of breath, but because of atrial fibrillation with RVR, she was brought in here, admitted to telemetry for atrial fibrillation with RVR. The patient received Cardizem 20 mg IV push in the ER. Admitted to telemetry. Dr. Neoma Laming was consulted. Started on amiodarone p.o. initially, later on started on amiodarone drip. The patient received amiodarone drip for around 24 hours, then changed to amiodarone p.o. The patient took amiodarone without side effects, so started on amiodarone p.o. and also she  was given Coreg 3.125 b.i.d. The patient started on Eliquis for anticoagulation. Discussed the risks and benefits of anticoagulation. The patient had echocardiogram, which showed EF 35%-40% with impaired relaxation pattern of LV diastolic filling. The patient's troponins have been negative.  2.  Hypothyroidism: TSH 7.12, with normal ft4.started on Synthroid at small dose.  3.  Lung cancer in remission. The patient has trouble breathing on and off and also chronic cough. Chest x-ray here showed moderate cardiomegaly with left heart prominence and right pleural effusion. The patient received some fluids when she came, and I was concerned that she has pulmonary edema so gave her some Lasix to go home with as well.   4.  History of COPD. We continued inhalers and chronic prednisone.  5.  History of iron deficiency anemia (.. She is on supplements.   DISCHARGE PHYSICAL EXAMINATION:  VITAL SIGNS: Temperature 98.3, heart rate 90, blood pressure 107/68, saturations 92% on room air.  CARDIOVASCULAR: S1, S2 regular.  LUNGS: Clear to auscultation. No wheeze. No rales.  ABDOMEN: Soft, nontender, nondistended. Bowel sounds present.  EXTREMITIES: No extremity edema, no cyanosis, no clubbing.   LABORATORY AND RADIOGRAPHIC DATA: The patient's chest x-ray on February 6 showed right pleural effusion, cardiomegaly with left heart prominence. Electrolytes on February 6, sodium 130, potassium 4, chloride 105, bicarbonate 27, BUN 15, creatinine 1.26, glucose 167. The patient's white count on February 6 showed white count 6.6, hemoglobin  7.9, hematocrit 20.3, platelets 218, 000.   DISCHARGE CONDITION: The patient went home in stable condition.   FOLLOWUP: The patient will see Dr. Neoma Laming in about a week   TIME SPENT ON DISCHARGING PATIENT: More than 30 minutes.    ____________________________ Epifanio Lesches, MD sk:bm D: 02/11/2014 21:49:33 ET T: 02/12/2014 01:09:35 ET JOB#: [id number]  cc: Epifanio Lesches, MD, <Dictator> Enid Derry, MD Dionisio David, MD   Epifanio Lesches MD ELECTRONICALLY SIGNED 02/13/2014 15:38

## 2014-05-04 NOTE — Consult Note (Signed)
No more GI bleeding. Multiple gastric AVM;s. These were cauterized. Full liquid diet ordered. Continue PPI for now. If recurrent bleeding from this site later, could try Barrx ablation treatment. thanks.  Electronic Signatures: Verdie Shire (MD)  (Signed on 22-Feb-16 14:02)  Authored  Last Updated: 22-Feb-16 14:02 by Verdie Shire (MD)

## 2014-05-04 NOTE — Consult Note (Signed)
PATIENT NAME:  Katrina Kramer, Katrina Kramer MR#:  [id number] DATE OF BIRTH:  12-26-1940  DATE OF CONSULTATION:  02/21/2014  REFERRING PHYSICIAN:  Dr. Serita Grit CONSULTING PHYSICIAN:  Lupita Dawn. Oh, MD/Mamie Diiorio A. Jerelene Redden, ANP (Adult Nurse Practitioner)  REASON FOR CONSULTATION: GI bleed.   HISTORY OF PRESENT ILLNESS: This 74 year old patient was recently admitted to the hospital in early February and discharged February 7 for rapid atrial fibrillation. She was sent home on amiodarone, Coreg with Eliquis. The patient presented back to the hospital 02/20/2014 with weakness, hallucination, not eating, was found to have hypotension with hemoglobin 3.5. There was red blood noted in the rectal vault. She was admitted with a GI bleed thought secondary to Eliquis. She has been given Kcentra for reversal. The patient was found to have a lactic acid of 9.5, creatinine 3.24, which is up from her baseline of 1.36. She was subsequently and intubated and has been in the ICU. The patient has required one blood transfusion and her hemoglobin today is 6.7.   The patient was evaluated while intubated and her husband at that time reports that she has had a little stomach discomfort, some belching, stools were soft for several months. They have noted no black stools.   She does have a recent EGD 04/06/2011 performed by Dr. 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   She also has a history of ERCP for abdominal pain, abnormal MRCP, and abnormal liver function studies performed 04/18/2011 at [facility]. She had major papilla abnormality that looked edematous and it was thought likely from a stone passing or from pancreatitis. There was irregularity found in the lower third of the main bile duct but appeared normal after sphincterotomy. There was no biliary stricture. There was positive cholecystolithiasis found. There is  no history in the chart of cholecystectomy.  Chart review also shows that she had gastric ulcers with clean base and duodenitis per EGD in 2009. She had a colonoscopy in 2010 for IDA secondary to chronic blood loss and this showed internal nonbleeding small hemorrhoids. She had an upper endoscopy in 2010 that showed 2 angiectasias in the duodenum, normal stomach, and normal esophagus.   PAST MEDICAL HISTORY: 1.  Atrial fibrillation recently diagnosed. Started on Eliquis 02/09/2014.  2.  Cardiomyopathy with ejection fraction 35% by echocardiogram. Stress test today.  3.  Chronic iron deficiency anemia.  4.  GERD.  5.  History of gastric ulcer 2009.  6.  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  7.  History of pancreatitis with ampullary mass benign 2013.  8.  History of pancreatitis with ERCP and sphincterotomy 04/08/2011 at [facility], benign findings.  9.  History of small bowel AVM.  10.   Colonoscopy in 2010 with fair bowel prep, 90% of the colon seen, no AVMs in the colon. Positive small nonbleeding hemorrhoids.  11.   Non-small cell CA lung cancer in remission.  12.   COPD.  13.   Iron-deficiency anemia.  14.   Hypertension.   PAST SURGICAL HISTORY: 1.  Tonsillectomy.  2.  Bronchoscopy.   MEDICATIONS ON ADMISSION: 1.  ProAir HFA 2 puffs 4 times daily as needed.  2.  Prednisone 2.5 mg p.o. daily.  3.  Levothyroxine 25 mg p.o. daily.  4.  Lasix 20 mg once daily.  5.  Folic acid 1 mg daily.  6.    Ferate 1 tablet 3 times  daily.  7     Cheratussin 10 mL every 4 hours as needed.  8.    Cetirizine 10 mg twice daily.  9.    Carvedilol 6.25 mg twice daily.  10.    Tessalon Perles 1 capsule 3 times daily.    11     Eliquis 5 mg twice daily.  12.    Amiodarone 400 mg twice daily.  13.    Advair 250/50 one puff twice daily.   ALLERGIES: NKDA.   FAMILY HISTORY: Negative for colon cancer.   SOCIAL HISTORY: Previous smoker lifelong, discontinued. Lives with husband.  REVIEW OF  SYSTEMS: Unobtainable as the patient is intubated.   PHYSICAL EXAMINATION: VITAL SIGNS: 90, 37 rectal, 73, 14, 97/73, 16, 97/44.  GENERAL: Well-nourished African American female who is somewhat awake. Opens her eyes, nods her head, says she is feeling well. Denies abdominal pain and goes back to sleep.  HEENT: Head is normocephalic.  NECK: Supple. Trachea is midline.  HEART: Heart tones S1 and S2.  LUNGS: CTA.  ABDOMEN: Soft, nontender. NG tube is draining a whitish fluid. No coffee-ground emesis noted.  RECTAL: Exam is deferred.  SKIN: Warm and dry. Positive edema.   LABORATORY STUDIES: Today with BUN 49, creatinine 2.87, sodium is 134, hemoglobin 6.7, hematocrit 20.6 improved from admission with hemoglobin 3.5, platelet count is 186,000. Pro time on admission 32.7, INR 3.2, PTT 44.4. Blood culture, urine culture no growth.   RADIOLOGY: KUB shows gastric tube placement in the stomach. No abnormal gas pattern. Chest x-ray, single view 02/18 showed chronic right lung changes, moderate cardiomegaly. No acute pulmonary finding. CT of the head showed no acute changes.   IMPRESSION: The patient with history of gastric ulcer, angiectasias in the duodenum, benign, duodenal mass, ERCP with sphincterotomy, chronic gastroesophageal reflux disease presents with gastrointestinal bleed. This is likely secondary to recent anticoagulation which has been reversed and discontinued. The patient did have a most recent colonoscopy 2010 that showed internal small hemorrhoids. Dr. Vira Agar has suspected small bowel arteriovenous malformation in the past, but looking through [facility] reports I do not see that she has had a capsule endoscopy. She is not followed actively by [facility] gastroenterology.   She has non-small cell bronchial carcinoma, followed actively by Dr. Ma Hillock. The patient appears hemodynamically stable and will be extubated today.   PLAN: Depending the patient's clinical course over the weekend,  Dr. Candace Cruise is considering EGD on Monday. These services provided by Joelene Millin A. Jerelene Redden, MS, APRN, BC, ANP (Adult Nurse Practitioner) under collaborative agreement with Lupita Dawn. Candace Cruise, MD. Thank you for the consultation.     ____________________________ Janalyn Harder Jerelene Redden, ANP (Adult Nurse Practitioner) kam:at D: 02/21/2014 17:32:49 ET T: 02/21/2014 17:55:39 ET JOB#: [id number]  cc: Joelene Millin A. Jerelene Redden, ANP (Adult Nurse Practitioner), <Dictator> Janalyn Harder Sherlyn Hay, MSN, ANP-BC Adult Nurse Practitioner ELECTRONICALLY SIGNED 02/24/2014 11:40

## 2014-05-08 ENCOUNTER — Ambulatory Visit
Admission: RE | Admit: 2014-05-08 | Discharge: 2014-05-08 | Disposition: A | Discharge status: Home / Self Care | Payer: Medicare Other | Source: Ambulatory Visit | Attending: Family Medicine | Admitting: Family Medicine

## 2014-05-08 ENCOUNTER — Other Ambulatory Visit: Payer: Self-pay | Admitting: Family Medicine

## 2014-05-08 DIAGNOSIS — R0689 Other abnormalities of breathing: Secondary | ICD-10-CM

## 2014-05-08 DIAGNOSIS — R0989 Other specified symptoms and signs involving the circulatory and respiratory systems: Secondary | ICD-10-CM | POA: Diagnosis present

## 2014-05-08 DIAGNOSIS — J189 Pneumonia, unspecified organism: Secondary | ICD-10-CM | POA: Insufficient documentation

## 2014-05-23 ENCOUNTER — Inpatient Hospital Stay: Payer: Medicare Other | Attending: Internal Medicine

## 2014-05-23 DIAGNOSIS — C801 Malignant (primary) neoplasm, unspecified: Secondary | ICD-10-CM

## 2014-05-23 DIAGNOSIS — C3411 Malignant neoplasm of upper lobe, right bronchus or lung: Secondary | ICD-10-CM | POA: Insufficient documentation

## 2014-05-23 DIAGNOSIS — Z452 Encounter for adjustment and management of vascular access device: Secondary | ICD-10-CM | POA: Insufficient documentation

## 2014-05-23 MED ORDER — HEPARIN SOD (PORK) LOCK FLUSH 100 UNIT/ML IV SOLN
INTRAVENOUS | Status: AC
  Filled 2014-05-23: qty 5

## 2014-05-23 MED ORDER — SODIUM CHLORIDE 0.9 % IJ SOLN
10.0000 mL | Freq: Once | INTRAMUSCULAR | Status: AC
  Administered 2014-05-23: 10 mL via INTRAVENOUS
  Filled 2014-05-23: qty 10

## 2014-05-23 MED ORDER — HEPARIN SOD (PORK) LOCK FLUSH 100 UNIT/ML IV SOLN
500.0000 [IU] | Freq: Once | INTRAVENOUS | Status: AC
  Administered 2014-05-23: 500 [IU] via INTRAVENOUS

## 2014-05-24 ENCOUNTER — Other Ambulatory Visit: Payer: Self-pay | Admitting: Internal Medicine

## 2014-05-26 ENCOUNTER — Other Ambulatory Visit: Payer: Self-pay | Admitting: Internal Medicine

## 2014-06-03 ENCOUNTER — Ambulatory Visit
Admission: RE | Admit: 2014-06-03 | Discharge: 2014-06-03 | Disposition: A | Discharge status: Home / Self Care | Payer: Medicare Other | Source: Ambulatory Visit | Attending: Family Medicine | Admitting: Family Medicine

## 2014-06-03 ENCOUNTER — Other Ambulatory Visit: Payer: Self-pay | Admitting: Family Medicine

## 2014-06-03 DIAGNOSIS — J189 Pneumonia, unspecified organism: Secondary | ICD-10-CM | POA: Insufficient documentation

## 2014-06-03 DIAGNOSIS — Z85118 Personal history of other malignant neoplasm of bronchus and lung: Secondary | ICD-10-CM | POA: Diagnosis not present

## 2014-06-18 ENCOUNTER — Other Ambulatory Visit: Payer: Self-pay | Admitting: *Deleted

## 2014-06-18 ENCOUNTER — Telehealth: Payer: Self-pay | Admitting: *Deleted

## 2014-06-18 NOTE — Telephone Encounter (Signed)
Husband called and requested refill for tessalon pearls and guaiatussin cough syrup.  Uses [facility].  aske [employer]negan covering for [employer] while on vacation and he approved refills, called [facility] with med refills.

## 2014-06-23 ENCOUNTER — Other Ambulatory Visit: Payer: Self-pay

## 2014-06-23 ENCOUNTER — Other Ambulatory Visit: Payer: Self-pay | Admitting: Family Medicine

## 2014-06-23 NOTE — Telephone Encounter (Signed)
Routing to provider  

## 2014-06-23 NOTE — Telephone Encounter (Signed)
Pt's husband called stated that pt needs refill Torsemide. Pharm is [facility]. Thanks.

## 2014-06-25 MED ORDER — TORSEMIDE 10 MG PO TABS: 15.0000 mg | ORAL_TABLET | Freq: Every day | ORAL | Status: DC

## 2014-06-27 ENCOUNTER — Telehealth: Payer: Self-pay | Admitting: Unknown Physician Specialty

## 2014-06-27 NOTE — Telephone Encounter (Signed)
Pt's husband called stated wife needs refill on Torsemide. Pharm is [facility]. Thanks.  Pt also needs Gabatentin.

## 2014-06-27 NOTE — Telephone Encounter (Signed)
rx already sent to pharm.

## 2014-06-30 ENCOUNTER — Other Ambulatory Visit: Payer: Self-pay

## 2014-06-30 MED ORDER — GABAPENTIN 100 MG PO CAPS: 100.0000 mg | ORAL_CAPSULE | Freq: Two times a day (BID) | ORAL | Status: DC

## 2014-07-01 ENCOUNTER — Other Ambulatory Visit: Payer: Self-pay

## 2014-07-01 DIAGNOSIS — C3491 Malignant neoplasm of unspecified part of right bronchus or lung: Secondary | ICD-10-CM

## 2014-07-02 ENCOUNTER — Inpatient Hospital Stay: Payer: Medicare Other

## 2014-07-02 ENCOUNTER — Other Ambulatory Visit: Payer: Self-pay

## 2014-07-02 ENCOUNTER — Inpatient Hospital Stay: Payer: Medicare Other | Attending: Internal Medicine

## 2014-07-02 ENCOUNTER — Ambulatory Visit
Admission: RE | Admit: 2014-07-02 | Discharge: 2014-07-02 | Disposition: A | Discharge status: Home / Self Care | Payer: Medicare Other | Source: Ambulatory Visit | Attending: Internal Medicine | Admitting: Internal Medicine

## 2014-07-02 DIAGNOSIS — C3491 Malignant neoplasm of unspecified part of right bronchus or lung: Secondary | ICD-10-CM | POA: Diagnosis not present

## 2014-07-02 DIAGNOSIS — Z08 Encounter for follow-up examination after completed treatment for malignant neoplasm: Secondary | ICD-10-CM | POA: Diagnosis present

## 2014-07-02 DIAGNOSIS — C3411 Malignant neoplasm of upper lobe, right bronchus or lung: Secondary | ICD-10-CM | POA: Insufficient documentation

## 2014-07-02 DIAGNOSIS — R938 Abnormal findings on diagnostic imaging of other specified body structures: Secondary | ICD-10-CM | POA: Insufficient documentation

## 2014-07-02 LAB — IRON AND TIBC
IRON: 16 ug/dL — AB (ref 28–170)
SATURATION RATIOS: 5 % — AB (ref 10.4–31.8)
TIBC: 311 ug/dL (ref 250–450)
UIBC: 295 ug/dL

## 2014-07-02 LAB — CBC WITH DIFFERENTIAL/PLATELET
BASOS ABS: 0 10*3/uL (ref 0–0.1)
BASOS PCT: 0 %
Eosinophils Absolute: 0 10*3/uL (ref 0–0.7)
Eosinophils Relative: 1 %
HCT: 23.5 % — ABNORMAL LOW (ref 35.0–47.0)
Hemoglobin: 7.2 g/dL — ABNORMAL LOW (ref 12.0–16.0)
Lymphocytes Relative: 11 %
Lymphs Abs: 0.5 10*3/uL — ABNORMAL LOW (ref 1.0–3.6)
MCH: 28.2 pg (ref 26.0–34.0)
MCHC: 30.5 g/dL — AB (ref 32.0–36.0)
MCV: 92.4 fL (ref 80.0–100.0)
MONO ABS: 0.4 10*3/uL (ref 0.2–0.9)
Monocytes Relative: 9 %
Neutro Abs: 3.4 10*3/uL (ref 1.4–6.5)
Neutrophils Relative %: 79 %
Platelets: 241 10*3/uL (ref 150–440)
RBC: 2.54 MIL/uL — ABNORMAL LOW (ref 3.80–5.20)
RDW: 21.2 % — AB (ref 11.5–14.5)
WBC: 4.3 10*3/uL (ref 3.6–11.0)

## 2014-07-02 LAB — HEPATIC FUNCTION PANEL
ALBUMIN: 2.9 g/dL — AB (ref 3.5–5.0)
ALT: 8 U/L — ABNORMAL LOW (ref 14–54)
AST: 15 U/L (ref 15–41)
Alkaline Phosphatase: 106 U/L (ref 38–126)
Bilirubin, Direct: 0.3 mg/dL (ref 0.1–0.5)
Indirect Bilirubin: 0.6 mg/dL (ref 0.3–0.9)
TOTAL PROTEIN: 7.7 g/dL (ref 6.5–8.1)
Total Bilirubin: 0.9 mg/dL (ref 0.3–1.2)

## 2014-07-02 LAB — FERRITIN: FERRITIN: 22 ng/mL (ref 11–307)

## 2014-07-02 LAB — CREATININE, SERUM
Creatinine, Ser: 1.85 mg/dL — ABNORMAL HIGH (ref 0.44–1.00)
GFR calc non Af Amer: 26 mL/min — ABNORMAL LOW (ref >60)
GFR, EST AFRICAN AMERICAN: 30 mL/min — AB (ref >60)

## 2014-07-02 LAB — CALCIUM: CALCIUM: 7.9 mg/dL — AB (ref 8.9–10.3)

## 2014-07-02 MED ORDER — HEPARIN SOD (PORK) LOCK FLUSH 100 UNIT/ML IV SOLN
500.0000 [IU] | Freq: Once | INTRAVENOUS | Status: AC
  Filled 2014-07-02: qty 5

## 2014-07-02 MED ORDER — SODIUM CHLORIDE 0.9 % IJ SOLN
10.0000 mL | INTRAMUSCULAR | Status: DC | PRN
  Filled 2014-07-02: qty 10

## 2014-07-02 NOTE — Telephone Encounter (Signed)
Not my patient

## 2014-07-02 NOTE — Telephone Encounter (Signed)
I just approved 2 month supply last week; please resolve with pharmacy

## 2014-07-02 NOTE — Telephone Encounter (Signed)
Called and spoke to pharmacist. Refill was sent last week.

## 2014-07-04 ENCOUNTER — Inpatient Hospital Stay: Payer: Medicare Other | Attending: Internal Medicine | Admitting: Internal Medicine

## 2014-07-04 ENCOUNTER — Inpatient Hospital Stay: Payer: Medicare Other

## 2014-07-04 VITALS — BP 160/61 | HR 71 | Temp 96.5°F | Ht 62.0 in | Wt 161.2 lb

## 2014-07-04 DIAGNOSIS — Z79899 Other long term (current) drug therapy: Secondary | ICD-10-CM | POA: Diagnosis not present

## 2014-07-04 DIAGNOSIS — C3411 Malignant neoplasm of upper lobe, right bronchus or lung: Secondary | ICD-10-CM | POA: Diagnosis not present

## 2014-07-04 DIAGNOSIS — D509 Iron deficiency anemia, unspecified: Secondary | ICD-10-CM

## 2014-07-04 MED ORDER — SODIUM CHLORIDE 0.9 % IV SOLN
500.0000 mg | Freq: Once | INTRAVENOUS | Status: AC
  Administered 2014-07-04: 500 mg via INTRAVENOUS
  Filled 2014-07-04: qty 25

## 2014-07-04 MED ORDER — SODIUM CHLORIDE 0.9 % IJ SOLN
10.0000 mL | INTRAMUSCULAR | Status: DC | PRN
  Administered 2014-07-04: 10 mL via INTRAVENOUS
  Filled 2014-07-04: qty 10

## 2014-07-04 MED ORDER — HEPARIN SOD (PORK) LOCK FLUSH 100 UNIT/ML IV SOLN
500.0000 [IU] | Freq: Once | INTRAVENOUS | Status: AC
  Filled 2014-07-04: qty 5

## 2014-07-04 MED ORDER — SODIUM CHLORIDE 0.9 % IV SOLN
INTRAVENOUS | Status: AC
  Administered 2014-07-04: 12:00:00 via INTRAVENOUS
  Filled 2014-07-04: qty 1000

## 2014-07-04 NOTE — Progress Notes (Signed)
Patient states that she has been feeling a little more fatigued. She also states that her breathing has been "ok". She uses her breathing machine at home and her oxygen.

## 2014-07-04 NOTE — Progress Notes (Unsigned)
Port-a-cath does not yield a blood return. Port-a-cath does flush without difficulty. No swelling, redness, or pain noted at port-a-cath site. MD, Dr. Ma Hillock, notified. Per MD, Dr. Ma Hillock, order: may still use port-a-cath today for Venofer treatment.

## 2014-07-09 ENCOUNTER — Telehealth: Payer: Self-pay | Admitting: Family Medicine

## 2014-07-09 ENCOUNTER — Other Ambulatory Visit: Payer: Self-pay | Admitting: Family Medicine

## 2014-07-09 DIAGNOSIS — D509 Iron deficiency anemia, unspecified: Secondary | ICD-10-CM

## 2014-07-09 DIAGNOSIS — C3411 Malignant neoplasm of upper lobe, right bronchus or lung: Secondary | ICD-10-CM

## 2014-07-09 DIAGNOSIS — E038 Other specified hypothyroidism: Secondary | ICD-10-CM

## 2014-07-09 MED ORDER — LIOTHYRONINE SODIUM 25 MCG PO TABS: 25.0000 ug | ORAL_TABLET | Freq: Every day | ORAL | Status: DC

## 2014-07-09 NOTE — Telephone Encounter (Signed)
I spoke with patient, she isn't sure, asked that I call her husband who is in the shop. We did schedule a lab appointment for Friday afternoon.

## 2014-07-09 NOTE — Telephone Encounter (Signed)
Patient's husband states she did take something else back at the first of the year for her thyroid but he doesn't recall what it is.

## 2014-07-09 NOTE — Telephone Encounter (Signed)
I'm not sure why patient came to me on cytomel Ask her if she was ever on another type of thyroid medicine I would like to check T3, free T4, and a TSH I'll give one week supply of this, but I have a feeling we're going to change it

## 2014-07-09 NOTE — Telephone Encounter (Signed)
Routing to provider  

## 2014-07-09 NOTE — Telephone Encounter (Signed)
E-Fax came through for refill: Rx: liothyronine (CYTOMEL) 25 MCG tablet Copy of Rx in basket

## 2014-07-11 ENCOUNTER — Other Ambulatory Visit: Payer: Medicare Other

## 2014-07-11 ENCOUNTER — Other Ambulatory Visit: Payer: Self-pay | Admitting: Family Medicine

## 2014-07-11 DIAGNOSIS — E039 Hypothyroidism, unspecified: Secondary | ICD-10-CM

## 2014-07-12 ENCOUNTER — Other Ambulatory Visit: Payer: Self-pay | Admitting: Family Medicine

## 2014-07-12 DIAGNOSIS — E038 Other specified hypothyroidism: Secondary | ICD-10-CM

## 2014-07-12 DIAGNOSIS — C3411 Malignant neoplasm of upper lobe, right bronchus or lung: Secondary | ICD-10-CM

## 2014-07-12 DIAGNOSIS — D509 Iron deficiency anemia, unspecified: Secondary | ICD-10-CM

## 2014-07-12 LAB — T3: T3, Total: 102 ng/dL (ref 71–180)

## 2014-07-12 LAB — T4, FREE: Free T4: 1.18 ng/dL (ref 0.82–1.77)

## 2014-07-12 LAB — TSH: TSH: 2.85 u[IU]/mL (ref 0.450–4.500)

## 2014-07-12 MED ORDER — LIOTHYRONINE SODIUM 25 MCG PO TABS: 25.0000 ug | ORAL_TABLET | Freq: Every day | ORAL | Status: DC

## 2014-07-12 NOTE — Progress Notes (Unsigned)
Labs okay; refilled med for 6 months

## 2014-07-15 NOTE — Progress Notes (Addendum)
[place]  Telephone:(336) [phone] [phone]) [phone]     ID: Katrina Kramer OB: 08/20/1940  MR#: [id number]  ZHY#:[id number]  Patient Care Team: Arnetha Courser, MD as PCP - General (Family Medicine)  CHIEF COMPLAINT/DIAGNOSIS:  1. 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 CT-guided biopsy on 02/10/11 - insufficient for definite diagnosis. Rare group of atypical cells. Got chemotherapy with Paclitaxel/Carboplatin (started on 02/14/11, completed cycle 6 day 8 on 08/22/11). Also got concurrent radiation. 01/18/12 - Underwent Preoperative bronchoscopy with bronchoalveolar lavage right upper lobe. Right thoracoscopy with pleural biopsy and talc pleurodesis. Negative for malignancy. PET scan on 03/29/12  IMPRESSION: Multifocal pleural activity in the right chest suggesting pleural tumor spread. Activity is also noted in the right hilar region suggesting persistent tumor in this region. April 04, 2012 - VERISTRAT test reported as 'GOOD'.  Started Tarceva on 05/17/12. July 2014 - declining performance status, unable to tolerate low dose Tarceva, was on home hospice which was discontinued since patient doing steady.  2. Chronic anemia - Hb is lower at 7.1 g. She has iron deficiency. Will give IV Venofer 500 mg and continue on oral iron. Likely has a component of anemia of chronic renal insufficiency. December 2015 - stool Hemoccult positive. 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 Colonoscopy reported internal nonbleeding hemorrhoids).   HISTORY OF PRESENT ILLNESS:  Patient returns for continued oncology followup, she was seen about 6-7 months ago. She has history of recurrent nonsmall cell lung cancer. She had f/u CT chest on June  29. States that she is doing fairly steady, chronic dyspnea on exertion is unchanged. States she does ambulate slowly inside home and does some household activity. She continues to have bothersome cough, takes tessalon perles but states has difficulty expectorating sputum. No hemoptysis, or chest pain. Appetite is good, no unintentional weight loss. No fevers, chills. Memory is chronically poor, patient accompanied by husband today who feels that she is doing about the same overall.    REVIEW OF SYSTEMS:   ROS As in HPI above. In addition, no fever, chills or sweats. No new headaches or seizures. No  sore throat or dysphagia. No dizziness or palpitation. No abdominal pain, constipation, diarrhea, dysuria or hematuria. No new skin rash or bleeding symptoms. No new paresthesias in extremities. PS ECOG 2.  PAST MEDICAL HISTORY: Reviewed.         COPD, ex-smoker   Hypertension  Iron deficiency anemia  Diverticulosis  GERD, history of gastric ulcers  EGD and colonoscopy 2010  Bronchoscopy January 2013 showed non-small cell carcinoma  Gallstone pancreatitis (acute) April 2013, status post procedure at [facility]  PAST SURGICAL HISTORY: Reviewed. As above  FAMILY HISTORY: Reviewed. Her mother died of cancer of unknown primary, uncle had lung cancer.  Otherwise noncontributory.  SOCIAL HISTORY: Reviewed. History  Substance Use Topics  . Smoking status: Not on file  . Smokeless tobacco: Not on file  . Alcohol Use: Not on file    No Known Allergies  Current Outpatient Prescriptions  Medication Sig Dispense Refill  . albuterol (PROVENTIL) (2.5 MG/3ML) 0.083% nebulizer solution USE 1 VIAL IN NEBULIZER EVERY 4 HOURS WHILE AWAKE AS NEEDED FOR SHORTNESS OF BREATH OR WHEEZING 360 mL 0  . amiodarone (PACERONE) 200 MG tablet Take 200 mg by mouth daily.    Marland Kitchen  aspirin EC 81 MG tablet Take 81 mg by mouth daily.    Marland Kitchen atorvastatin (LIPITOR) 20 MG tablet Take 20 mg by mouth daily.    . benzonatate (TESSALON)  100 MG capsule Take 100 mg by mouth 3 (three) times daily as needed for cough.    . carvedilol (COREG) 12.5 MG tablet Take 12.5 mg by mouth 2 (two) times daily with a meal.    . chlorpheniramine (CHLOR-TRIMETON) 4 MG tablet Take 4 mg by mouth 2 (two) times daily as needed for allergies.    . ferrous gluconate (FERATE) 240 (27 FE) MG tablet Take 240 mg by mouth 3 (three) times daily with meals.    . folic acid (FOLVITE) 1 MG tablet Take 1 mg by mouth daily.    Marland Kitchen guaiFENesin-codeine (ROBITUSSIN AC) 100-10 MG/5ML syrup Take 5 mLs by mouth 3 (three) times daily as needed for cough.    . isosorbide dinitrate (ISORDIL) 30 MG tablet Take 30 mg by mouth daily.    . pantoprazole (PROTONIX) 40 MG tablet Take 40 mg by mouth daily.    . potassium chloride SA (K-DUR,KLOR-CON) 20 MEQ tablet Take 20 mEq by mouth 2 (two) times daily.    . predniSONE (DELTASONE) 5 MG tablet Take 5 mg by mouth daily with breakfast. 1/2 tab daily    . torsemide (DEMADEX) 10 MG tablet Take 1.5 tablets (15 mg total) by mouth daily. 45 tablet 1  . gabapentin (NEURONTIN) 100 MG capsule Take 1 capsule (100 mg total) by mouth 2 (two) times daily. (Patient not taking: Reported on 07/04/2014) 60 capsule 2  . liothyronine (CYTOMEL) 25 MCG tablet Take 1 tablet (25 mcg total) by mouth daily. 30 tablet 5   No current facility-administered medications for this visit.   Facility-Administered Medications Ordered in Other Visits  Medication Dose Route Frequency Provider Last Rate Last Dose  . 0.9 %  sodium chloride infusion   Intravenous Continuous Leia Alf, MD 20 mL/hr at 07/04/14 1150    . heparin lock flush 100 unit/mL  500 Units Intravenous Once Leia Alf, MD      . heparin lock flush 100 unit/mL  500 Units Intravenous Once Leia Alf, MD      . sodium chloride 0.9 % injection 10 mL  10 mL Intravenous PRN Leia Alf, MD      . sodium chloride 0.9 % injection 10 mL  10 mL Intravenous PRN Leia Alf, MD   10 mL at 07/04/14  1145    PHYSICAL EXAM: Filed Vitals:   07/04/14 0926  BP: 160/61  Pulse: 71  Temp: 96.5 F (35.8 C)     Body mass index is 29.47 kg/(m^2).    ECOG FS:2 - Symptomatic, <50% confined to bed  GENERAL: Patient is chronically weak and sitting in wheelchair, otherwise alert and oriented and in no acute distress. No icterus. Pallor +. HEENT: EOMs intact. Oral exam negative for thrush or lesions. No cervical lymphadenopathy. CVS: S1S2, regular LUNGS: Bilaterally diminished BS more so on right side, occasional rhonchi ABDOMEN: Soft, nontender.    NEURO: grossly nonfocal, cranial nerves are intact.   EXTREMITIES: No pedal edema.   LAB RESULTS:    Component Value Date/Time   NA 135* 02/08/2014 0844   K 4.0 02/08/2014 0844   CL 105 02/08/2014 0844   CO2 21 02/08/2014 0844   GLUCOSE 157* 02/08/2014 0844   BUN 15 02/08/2014 0844   CREATININE 1.85* 07/02/2014 1333   CREATININE 1.36* 02/08/2014 0844   CALCIUM  7.9* 07/02/2014 1333   CALCIUM 8.8 02/08/2014 0844   PROT 7.7 07/02/2014 1333   PROT 7.9 12/06/2013 1453   ALBUMIN 2.9* 07/02/2014 1333   ALBUMIN 2.8* 12/06/2013 1453   AST 15 07/02/2014 1333   AST 16 12/06/2013 1453   ALT 8* 07/02/2014 1333   ALT 12* 12/06/2013 1453   ALKPHOS 106 07/02/2014 1333   ALKPHOS 82 12/06/2013 1453   BILITOT 0.9 07/02/2014 1333   GFRNONAA 26* 07/02/2014 1333   GFRNONAA 32* 07/30/2013 1508   GFRAA 30* 07/02/2014 1333   GFRAA 37* 07/30/2013 1508   Lab Results  Component Value Date   WBC 4.3 07/02/2014   NEUTROABS 3.4 07/02/2014   HGB 7.2* 07/02/2014   HCT 23.5* 07/02/2014   MCV 92.4 07/02/2014   PLT 241 07/02/2014     STUDIES: Ct Chest Wo Contrast  07/02/2014   CLINICAL DATA:  Subsequent encounter for restaging right upper lobe lung cancer.  EXAM: CT CHEST WITHOUT CONTRAST  TECHNIQUE: Multidetector CT imaging of the chest was performed following the standard protocol without IV contrast.  COMPARISON:  12/03/2013.  FINDINGS: Mediastinum /  Lymph Nodes: The tip of the left Port-A-Cath is at the innominate vein confluence, as before. Pleural-parenchymal treatment changes in the right apex and medial right upper lung are stable. Volume loss in the right hemi thorax has progressed in the interval in there is some subtle increase in pleural thickening along the right lateral pleura. No mediastinal lymphadenopathy. The heart size is upper normal. Coronary artery calcification is noted. No pericardial effusion. Small right anterior juxta diaphragmatic lymph nodes are stable.  Lungs / Pleura: There is interval development of thickening of inferior septae on the right. Scarring in the left upper lobe is not substantially changed in the interval. No evidence for pulmonary edema or pleural effusion.  MSK / Soft Tissues: Bone windows reveal no worrisome lytic or sclerotic osseous lesions.  Upper Abdomen:  Unremarkable.  IMPRESSION: Some interval further volume loss in the right hemi thorax with the increasing collapse/ consolidation anteriorly in the region of apparent fibrosis. This is associated with development of some septal thickening along the inferior right lung and continued close attention recommended as recurrent disease cannot be excluded.   Electronically Signed   By: Misty Stanley M.D.   On: 07/02/2014 13:31     ASSESSMENT / PLAN:   1. 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 CT scan reports some further volume loss but otherwise no obvious evidence to  suggest progressive lung cancer. Patient continues to do fairly steady without any new symptoms to suggest recurrent or progressive lung cancer. She continues to have chronic dyspnea on exertion and bothersome cough and will continue on opioid cough syrup, have recommended adding expectorant like mucinex prn. Plan is continued surveillance. We will continue port flush q.6 weeks. Will see her back in September with CBC, creatinine, calcium, LFT. 2. Chronic anemia - Hb is lower at 7.1 g. She has iron deficiency. Will give IV Venofer 500 mg and continue on oral iron.  Likely has a component of anemia of chronic renal insufficiency. If anemia does not improve with iron therapy, then could consider Procrit therapy. Patient denies any bleeding issues at this time. She does not do much activity otherwise. 3. In between visits, patient advised to call or come to ER in case of any worsening symptoms or acute sickness. She is agreeable to this plan.   Leia Alf, MD   07/15/2014 10:38 PM

## 2014-07-30 ENCOUNTER — Other Ambulatory Visit: Payer: Self-pay | Admitting: Family Medicine

## 2014-07-30 DIAGNOSIS — D509 Iron deficiency anemia, unspecified: Secondary | ICD-10-CM

## 2014-07-30 DIAGNOSIS — C3411 Malignant neoplasm of upper lobe, right bronchus or lung: Secondary | ICD-10-CM

## 2014-07-30 NOTE — Telephone Encounter (Signed)
She needs 90 day supplies on meds and sent to mail order.

## 2014-07-30 NOTE — Telephone Encounter (Signed)
E-fax came through for refill on: Rx: Torsemide tab Rx: Benzonatate cap Rx: Isosorbide monomit tab Copy in basket.

## 2014-07-31 MED ORDER — TORSEMIDE 10 MG PO TABS: 15.0000 mg | ORAL_TABLET | Freq: Every day | ORAL | Status: DC

## 2014-07-31 NOTE — Telephone Encounter (Signed)
I don't prescribe the majority of these medicines I will refill the torsemide only She needs to contact the prescriber of the other medicines to get those refilled please

## 2014-08-15 ENCOUNTER — Inpatient Hospital Stay: Payer: Medicare Other | Attending: Internal Medicine

## 2014-08-15 ENCOUNTER — Telehealth: Payer: Self-pay | Admitting: Family Medicine

## 2014-08-15 DIAGNOSIS — C3411 Malignant neoplasm of upper lobe, right bronchus or lung: Secondary | ICD-10-CM | POA: Insufficient documentation

## 2014-08-15 DIAGNOSIS — Z452 Encounter for adjustment and management of vascular access device: Secondary | ICD-10-CM | POA: Insufficient documentation

## 2014-08-15 DIAGNOSIS — D509 Iron deficiency anemia, unspecified: Secondary | ICD-10-CM

## 2014-08-15 DIAGNOSIS — C801 Malignant (primary) neoplasm, unspecified: Secondary | ICD-10-CM

## 2014-08-15 MED ORDER — POTASSIUM CHLORIDE CRYS ER 20 MEQ PO TBCR
20.0000 meq | EXTENDED_RELEASE_TABLET | Freq: Two times a day (BID) | ORAL | Status: DC
Start: 2014-08-15 — End: 2014-08-29

## 2014-08-15 MED ORDER — HEPARIN SOD (PORK) LOCK FLUSH 100 UNIT/ML IV SOLN
INTRAVENOUS | Status: AC
  Filled 2014-08-15: qty 5

## 2014-08-15 MED ORDER — HEPARIN SOD (PORK) LOCK FLUSH 100 UNIT/ML IV SOLN
500.0000 [IU] | Freq: Once | INTRAVENOUS | Status: AC
  Administered 2014-08-15: 500 [IU] via INTRAVENOUS

## 2014-08-15 MED ORDER — SODIUM CHLORIDE 0.9 % IJ SOLN
10.0000 mL | INTRAMUSCULAR | Status: DC | PRN
  Administered 2014-08-15: 10 mL via INTRAVENOUS
  Filled 2014-08-15: qty 10

## 2014-08-15 NOTE — Telephone Encounter (Signed)
Patient called stating she needs refill on her Rx: potassium chloride SA (K-DUR,KLOR-CON) 20 MEQ tablet. She would like a 3 month supply sent to [facility] mail order, thanks. Please call pt when it is done.

## 2014-08-15 NOTE — Telephone Encounter (Signed)
Forward to provider

## 2014-08-15 NOTE — Telephone Encounter (Signed)
Called and notified patients husband of response from [place].

## 2014-08-15 NOTE — Telephone Encounter (Signed)
Please ask her to have the labs ordered by Dr. Ma Hillock because I would like to see the potassium result from that set he has ordered She can contact his office about when/where to have them drawn I really need the results of that BMP I did send the medicine in as requested, but want to make sure we're monitoring her potassium appropriately

## 2014-08-18 ENCOUNTER — Telehealth: Payer: Self-pay | Admitting: Family Medicine

## 2014-08-18 NOTE — Telephone Encounter (Signed)
PTS HUSBAND CALLED AND WAS UNSURE WHAT TO DO ABOUT BLOOD WORK TO GET MEDICATION AND WAS RETURNING YOUR(AMY) CALL.

## 2014-08-19 ENCOUNTER — Other Ambulatory Visit: Payer: Medicare Other

## 2014-08-19 DIAGNOSIS — I10 Essential (primary) hypertension: Secondary | ICD-10-CM

## 2014-08-19 NOTE — Telephone Encounter (Signed)
Patient scheduled for lab appointment.

## 2014-08-20 ENCOUNTER — Encounter: Payer: Self-pay | Admitting: Family Medicine

## 2014-08-20 LAB — BASIC METABOLIC PANEL
BUN/Creatinine Ratio: 19 (ref 11–26)
BUN: 32 mg/dL — ABNORMAL HIGH (ref 8–27)
CO2: 25 mmol/L (ref 18–29)
CREATININE: 1.66 mg/dL — AB (ref 0.57–1.00)
Calcium: 8.7 mg/dL (ref 8.7–10.3)
Chloride: 94 mmol/L — ABNORMAL LOW (ref 97–108)
GFR calc Af Amer: 35 mL/min/{1.73_m2} — ABNORMAL LOW (ref >59)
GFR, EST NON AFRICAN AMERICAN: 31 mL/min/{1.73_m2} — AB (ref >59)
GLUCOSE: 136 mg/dL — AB (ref 65–99)
Potassium: 3.8 mmol/L (ref 3.5–5.2)
Sodium: 136 mmol/L (ref 134–144)

## 2014-08-21 DIAGNOSIS — I10 Essential (primary) hypertension: Secondary | ICD-10-CM | POA: Insufficient documentation

## 2014-08-21 DIAGNOSIS — E039 Hypothyroidism, unspecified: Secondary | ICD-10-CM | POA: Insufficient documentation

## 2014-08-21 DIAGNOSIS — I4891 Unspecified atrial fibrillation: Secondary | ICD-10-CM | POA: Insufficient documentation

## 2014-08-21 DIAGNOSIS — C3490 Malignant neoplasm of unspecified part of unspecified bronchus or lung: Secondary | ICD-10-CM | POA: Insufficient documentation

## 2014-08-21 DIAGNOSIS — D649 Anemia, unspecified: Secondary | ICD-10-CM | POA: Insufficient documentation

## 2014-08-22 ENCOUNTER — Other Ambulatory Visit: Payer: Self-pay

## 2014-08-22 DIAGNOSIS — D509 Iron deficiency anemia, unspecified: Secondary | ICD-10-CM

## 2014-08-22 DIAGNOSIS — C3411 Malignant neoplasm of upper lobe, right bronchus or lung: Secondary | ICD-10-CM

## 2014-08-22 MED ORDER — PREDNISONE 5 MG PO TABS: 2.5000 mg | ORAL_TABLET | Freq: Every day | ORAL | Status: DC

## 2014-08-29 ENCOUNTER — Ambulatory Visit (INDEPENDENT_AMBULATORY_CARE_PROVIDER_SITE_OTHER): Payer: Medicare Other | Admitting: Family Medicine

## 2014-08-29 VITALS — BP 108/64 | HR 87 | Temp 98.7°F | Ht <= 58 in | Wt 159.0 lb

## 2014-08-29 DIAGNOSIS — J438 Other emphysema: Secondary | ICD-10-CM

## 2014-08-29 DIAGNOSIS — I480 Paroxysmal atrial fibrillation: Secondary | ICD-10-CM | POA: Diagnosis not present

## 2014-08-29 DIAGNOSIS — L249 Irritant contact dermatitis, unspecified cause: Secondary | ICD-10-CM

## 2014-08-29 DIAGNOSIS — I10 Essential (primary) hypertension: Secondary | ICD-10-CM | POA: Diagnosis not present

## 2014-08-29 DIAGNOSIS — Z8719 Personal history of other diseases of the digestive system: Secondary | ICD-10-CM

## 2014-08-29 DIAGNOSIS — C3411 Malignant neoplasm of upper lobe, right bronchus or lung: Secondary | ICD-10-CM

## 2014-08-29 DIAGNOSIS — E876 Hypokalemia: Secondary | ICD-10-CM

## 2014-08-29 DIAGNOSIS — E038 Other specified hypothyroidism: Secondary | ICD-10-CM

## 2014-08-29 DIAGNOSIS — D509 Iron deficiency anemia, unspecified: Secondary | ICD-10-CM | POA: Diagnosis not present

## 2014-08-29 DIAGNOSIS — L259 Unspecified contact dermatitis, unspecified cause: Secondary | ICD-10-CM

## 2014-08-29 DIAGNOSIS — T50905A Adverse effect of unspecified drugs, medicaments and biological substances, initial encounter: Principal | ICD-10-CM

## 2014-08-29 MED ORDER — TRIAMCINOLONE ACETONIDE 0.5 % EX CREA: 1.0000 "application " | TOPICAL_CREAM | Freq: Two times a day (BID) | CUTANEOUS | Status: DC | PRN

## 2014-08-29 MED ORDER — POTASSIUM CHLORIDE CRYS ER 20 MEQ PO TBCR: 20.0000 meq | EXTENDED_RELEASE_TABLET | Freq: Every day | ORAL | Status: DC

## 2014-08-29 MED ORDER — PREDNISONE 5 MG PO TABS
2.5000 mg | ORAL_TABLET | Freq: Every day | ORAL | Status: DC
Start: 2014-08-29 — End: 2014-09-16

## 2014-08-29 NOTE — Progress Notes (Signed)
BP 108/64 mmHg  Pulse 87  Temp(Src) 98.7 F (37.1 C)  Ht 4' 8.5" (1.435 m)  Wt 159 lb (72.122 kg)  BMI 35.02 kg/m2  SpO2 99%   Subjective:    Patient ID: Katrina Kramer, female    DOB: 1940/05/23, 74 y.o.   MRN: [id number]  HPI: Katrina Kramer is a 74 y.o. female  Chief Complaint  Patient presents with  . Hypothyroidism    needs med refill  . Rash    both hands  . Medication Problem    Her Postassium rx was once a day but new bottle is twice a day. They want to know which directions are correct.   She has anemia; on iron therapy; seeing hematolgist Breathing is doing okay  Weight has been stable On pacerone and other meds from cardiologist She has fluid and potassium pills that we discussed  The prednisone is on mail order; just taking 2.5 mg of prednisone daily; no extreme fatigue; off for 2 weeks; it should be here any day  Cholesterol; tries to avoid fatty foods; every once in a while has a cheeseburger; corn flakes; not a big egg eater, just one at a time, maybe one or two a week  She broke on her hands this week; new dishwashing detergent; just on the hands; no blisters; skin peeled; started  Relevant past medical, surgical, family and social history reviewed and updated as indicated. Interim medical history since our last visit reviewed. Allergies and medications reviewed and updated.  Review of Systems  Per HPI unless specifically indicated above     Objective:    BP 108/64 mmHg  Pulse 87  Temp(Src) 98.7 F (37.1 C)  Ht 4' 8.5" (1.435 m)  Wt 159 lb (72.122 kg)  BMI 35.02 kg/m2  SpO2 99%  Wt Readings from Last 3 Encounters:  08/29/14 159 lb (72.122 kg)  05/29/14 163 lb (73.936 kg)  07/04/14 161 lb 2.5 oz (73.1 kg)    Physical Exam  Constitutional: She appears well-developed and well-nourished. No distress.  Seated in wheelchair, gait not assessed  HENT:  Head: Normocephalic and atraumatic.  Eyes: EOM are normal. No scleral icterus.  Neck: No  thyromegaly present.  Cardiovascular: Normal rate, regular rhythm and normal heart sounds.   No murmur heard. Pulmonary/Chest: Effort normal and breath sounds normal. No respiratory distress. She has no wheezes.  Abdominal: Soft. Bowel sounds are normal. She exhibits no distension.  Musculoskeletal: Normal range of motion. She exhibits no edema.  Neurological: She is alert. She exhibits normal muscle tone.  Skin: Skin is warm and dry. Rash noted. She is not diaphoretic. No pallor.  Rash on the hands, dorsal and palmar surfaces; desquamation noted with healthy-appearing skin underneath; no active vesicles; no erytjema, no drainage  Psychiatric: She has a normal mood and affect. Her behavior is normal. Judgment and thought content normal.    Results for orders placed or performed in visit on 08/21/14  HM COLONOSCOPY  Result Value Ref Range   HM Colonoscopy per care everywhere       Assessment & Plan:   Problem List Items Addressed This Visit      Cardiovascular and Mediastinum   Atrial fibrillation    Managed by cardiologist; she sounds to be in NSR today on medications      Hypertension    Well-controlled; on meds through cardiologist        Respiratory   Lung cancer    Managed by oncologist  Relevant Medications   potassium chloride SA (K-DUR,KLOR-CON) 20 MEQ tablet   predniSONE (DELTASONE) 5 MG tablet   CAFL (chronic airflow limitation)    Continue prednisone; she should never stop this abruptly, I explained; Rx should be coming soon from mail order, but I wrote out Rx to take with them to fill locally if not here tomorrow      Relevant Medications   predniSONE (DELTASONE) 5 MG tablet     Endocrine   Hypothyroidism    Recent TSH checked; continue med        Musculoskeletal and Integument   Irritant hand dermatitis    Appears to be contact dermatitis, likely chemical irritant; treat with corticosteroid; avoid same chemical; notify me if spreading or not  improving        Other   IDA (iron deficiency anemia)    Explained that the PPI may be interfering with her body's ability to absorb iron and to ask the prescribing provider if she can reduce the PPI from BID dosing to just once daily dosing; I will not adjust, but asked her to contact that provider independently; she apparently had a GI bleed, but I don't know specifics      Relevant Medications   potassium chloride SA (K-DUR,KLOR-CON) 20 MEQ tablet   predniSONE (DELTASONE) 5 MG tablet   Drug-induced hypokalemia - Primary    On diuretic; continue potassium replacement; I wrote on the bottle to take once a day, as that is what she was taking when the last labs were drawn; med list corrected      H/O upper gastrointestinal hemorrhage    Per [facility] records; on BID PPI, but that may be interfering with iron absorption; patient to talk with prescriber of the PPI to see if time to reduce dose; no red flags today, nothing to suggest ongoing bleeding          Follow up plan: Return in about 6 months (around 03/01/2015) for follow-up. An after-visit summary was printed and given to the patient at [facility].  Please see the patient instructions which may contain other information and recommendations beyond what is mentioned above in the assessment and plan. Meds ordered this encounter  Medications  . potassium chloride SA (K-DUR,KLOR-CON) 20 MEQ tablet    Sig: Take 1 tablet (20 mEq total) by mouth daily.  Marland Kitchen DISCONTD: triamcinolone cream (KENALOG) 0.5 %    Sig: Apply 1 application topically 2 (two) times daily as needed.    Dispense:  30 g    Refill:  0  . predniSONE (DELTASONE) 5 MG tablet    Sig: Take 0.5 tablets (2.5 mg total) by mouth daily with breakfast. 1/2 tab daily    Dispense:  15 tablet    Refill:  2  . triamcinolone cream (KENALOG) 0.5 %    Sig: Apply 1 application topically 2 (two) times daily as needed.    Dispense:  30 g    Refill:  0

## 2014-08-29 NOTE — Patient Instructions (Addendum)
Please do ask Dr. Humphrey Rolls if it is time for you to decrease your pantoprazole (Protonix) to just once a day This might be interfering with your body's ability to absorb iron Try the new cream on your hands Avoid hot water and strong detergents Return in 6 months, but know that I am here if you need anything sooner

## 2014-08-31 DIAGNOSIS — T50905A Adverse effect of unspecified drugs, medicaments and biological substances, initial encounter: Principal | ICD-10-CM

## 2014-08-31 DIAGNOSIS — L249 Irritant contact dermatitis, unspecified cause: Secondary | ICD-10-CM | POA: Insufficient documentation

## 2014-08-31 DIAGNOSIS — K219 Gastro-esophageal reflux disease without esophagitis: Secondary | ICD-10-CM | POA: Insufficient documentation

## 2014-08-31 DIAGNOSIS — E876 Hypokalemia: Secondary | ICD-10-CM | POA: Insufficient documentation

## 2014-08-31 NOTE — Assessment & Plan Note (Signed)
On diuretic; continue potassium replacement; I wrote on the bottle to take once a day, as that is what she was taking when the last labs were drawn; med list corrected

## 2014-08-31 NOTE — Assessment & Plan Note (Signed)
Continue prednisone; she should never stop this abruptly, I explained; Rx should be coming soon from mail order, but I wrote out Rx to take with them to fill locally if not here tomorrow

## 2014-08-31 NOTE — Assessment & Plan Note (Signed)
Managed by cardiologist; she sounds to be in NSR today on medications

## 2014-08-31 NOTE — Assessment & Plan Note (Signed)
Recent TSH checked; continue med

## 2014-08-31 NOTE — Assessment & Plan Note (Signed)
Explained that the PPI may be interfering with her body's ability to absorb iron and to ask the prescribing provider if she can reduce the PPI from BID dosing to just once daily dosing; I will not adjust, but asked her to contact that provider independently; she apparently had a GI bleed, but I don't know specifics

## 2014-08-31 NOTE — Assessment & Plan Note (Signed)
Well-controlled; on meds through cardiologist

## 2014-08-31 NOTE — Assessment & Plan Note (Signed)
Per [facility] records; on BID PPI, but that may be interfering with iron absorption; patient to talk with prescriber of the PPI to see if time to reduce dose; no red flags today, nothing to suggest ongoing bleeding

## 2014-08-31 NOTE — Assessment & Plan Note (Signed)
Managed by oncologist 

## 2014-08-31 NOTE — Assessment & Plan Note (Signed)
Appears to be contact dermatitis, likely chemical irritant; treat with corticosteroid; avoid same chemical; notify me if spreading or not improving

## 2014-09-04 ENCOUNTER — Other Ambulatory Visit: Payer: Self-pay

## 2014-09-04 MED ORDER — GABAPENTIN 100 MG PO CAPS: 100.0000 mg | ORAL_CAPSULE | Freq: Two times a day (BID) | ORAL | Status: DC

## 2014-09-04 NOTE — Telephone Encounter (Signed)
Needs 90 day supply for mail order.

## 2014-09-15 ENCOUNTER — Other Ambulatory Visit: Payer: Medicare Other

## 2014-09-15 ENCOUNTER — Inpatient Hospital Stay: Payer: Medicare Other | Attending: Internal Medicine

## 2014-09-15 ENCOUNTER — Telehealth: Payer: Self-pay | Admitting: Family Medicine

## 2014-09-15 ENCOUNTER — Inpatient Hospital Stay: Payer: Medicare Other

## 2014-09-15 DIAGNOSIS — K219 Gastro-esophageal reflux disease without esophagitis: Secondary | ICD-10-CM | POA: Diagnosis not present

## 2014-09-15 DIAGNOSIS — C3411 Malignant neoplasm of upper lobe, right bronchus or lung: Secondary | ICD-10-CM

## 2014-09-15 DIAGNOSIS — Z23 Encounter for immunization: Secondary | ICD-10-CM | POA: Insufficient documentation

## 2014-09-15 DIAGNOSIS — R531 Weakness: Secondary | ICD-10-CM | POA: Diagnosis not present

## 2014-09-15 DIAGNOSIS — I10 Essential (primary) hypertension: Secondary | ICD-10-CM | POA: Insufficient documentation

## 2014-09-15 DIAGNOSIS — D509 Iron deficiency anemia, unspecified: Secondary | ICD-10-CM | POA: Insufficient documentation

## 2014-09-15 DIAGNOSIS — J449 Chronic obstructive pulmonary disease, unspecified: Secondary | ICD-10-CM | POA: Diagnosis not present

## 2014-09-15 DIAGNOSIS — Z79899 Other long term (current) drug therapy: Secondary | ICD-10-CM | POA: Diagnosis not present

## 2014-09-15 DIAGNOSIS — Z87891 Personal history of nicotine dependence: Secondary | ICD-10-CM | POA: Diagnosis not present

## 2014-09-15 DIAGNOSIS — Z9221 Personal history of antineoplastic chemotherapy: Secondary | ICD-10-CM | POA: Insufficient documentation

## 2014-09-15 DIAGNOSIS — J069 Acute upper respiratory infection, unspecified: Secondary | ICD-10-CM | POA: Diagnosis not present

## 2014-09-15 DIAGNOSIS — Z923 Personal history of irradiation: Secondary | ICD-10-CM | POA: Insufficient documentation

## 2014-09-15 LAB — CBC WITH DIFFERENTIAL/PLATELET
BASOS ABS: 0 10*3/uL (ref 0–0.1)
BASOS PCT: 0 %
EOS ABS: 0 10*3/uL (ref 0–0.7)
EOS PCT: 0 %
HCT: 20.9 % — ABNORMAL LOW (ref 35.0–47.0)
HEMOGLOBIN: 6.7 g/dL — AB (ref 12.0–16.0)
LYMPHS ABS: 0.5 10*3/uL — AB (ref 1.0–3.6)
Lymphocytes Relative: 5 %
MCH: 27.8 pg (ref 26.0–34.0)
MCHC: 31.9 g/dL — ABNORMAL LOW (ref 32.0–36.0)
MCV: 87.2 fL (ref 80.0–100.0)
Monocytes Absolute: 0.7 10*3/uL (ref 0.2–0.9)
Monocytes Relative: 7 %
NEUTROS PCT: 88 %
Neutro Abs: 8.4 10*3/uL — ABNORMAL HIGH (ref 1.4–6.5)
PLATELETS: 237 10*3/uL (ref 150–440)
RBC: 2.39 MIL/uL — AB (ref 3.80–5.20)
RDW: 20.6 % — ABNORMAL HIGH (ref 11.5–14.5)
WBC: 9.6 10*3/uL (ref 3.6–11.0)

## 2014-09-15 LAB — BASIC METABOLIC PANEL
ANION GAP: 8 (ref 5–15)
BUN: 27 mg/dL — ABNORMAL HIGH (ref 6–20)
CHLORIDE: 97 mmol/L — AB (ref 101–111)
CO2: 26 mmol/L (ref 22–32)
Calcium: 7.7 mg/dL — ABNORMAL LOW (ref 8.9–10.3)
Creatinine, Ser: 1.93 mg/dL — ABNORMAL HIGH (ref 0.44–1.00)
GFR, EST AFRICAN AMERICAN: 29 mL/min — AB (ref >60)
GFR, EST NON AFRICAN AMERICAN: 25 mL/min — AB (ref >60)
Glucose, Bld: 203 mg/dL — ABNORMAL HIGH (ref 65–99)
POTASSIUM: 3.8 mmol/L (ref 3.5–5.1)
SODIUM: 131 mmol/L — AB (ref 135–145)

## 2014-09-15 LAB — HEPATIC FUNCTION PANEL
ALK PHOS: 96 U/L (ref 38–126)
ALT: 9 U/L — ABNORMAL LOW (ref 14–54)
AST: 18 U/L (ref 15–41)
Albumin: 2.4 g/dL — ABNORMAL LOW (ref 3.5–5.0)
BILIRUBIN DIRECT: 0.2 mg/dL (ref 0.1–0.5)
BILIRUBIN INDIRECT: 0.7 mg/dL (ref 0.3–0.9)
BILIRUBIN TOTAL: 0.9 mg/dL (ref 0.3–1.2)
TOTAL PROTEIN: 7.8 g/dL (ref 6.5–8.1)

## 2014-09-15 LAB — IRON AND TIBC
Iron: 14 ug/dL — ABNORMAL LOW (ref 28–170)
SATURATION RATIOS: 6 % — AB (ref 10.4–31.8)
TIBC: 223 ug/dL — ABNORMAL LOW (ref 250–450)
UIBC: 209 ug/dL

## 2014-09-15 LAB — FERRITIN: FERRITIN: 58 ng/mL (ref 11–307)

## 2014-09-15 MED ORDER — SODIUM CHLORIDE 0.9 % IJ SOLN
10.0000 mL | Freq: Once | INTRAMUSCULAR | Status: AC
  Administered 2014-09-15: 10 mL via INTRAVENOUS
  Filled 2014-09-15: qty 10

## 2014-09-15 MED ORDER — HEPARIN SOD (PORK) LOCK FLUSH 100 UNIT/ML IV SOLN
500.0000 [IU] | Freq: Once | INTRAVENOUS | Status: AC
  Administered 2014-09-15: 500 [IU] via INTRAVENOUS
  Filled 2014-09-15: qty 5

## 2014-09-15 NOTE — Telephone Encounter (Signed)
Routing to provider  

## 2014-09-15 NOTE — Telephone Encounter (Signed)
Pt used cream she was given but while using this cream she had been wearing latex gloves and now her hands are swollen and have blisters on them and her husband wanted to know if there was something that he could do or if she would need to come in.

## 2014-09-15 NOTE — Telephone Encounter (Signed)
pts husband says call cell first [phone]

## 2014-09-15 NOTE — Telephone Encounter (Signed)
I spoke with patient's husband The cream helped, but he says that she was putting on latex gloves; he didn't realize it for a few days I offered referral to derm; he declined Let's try cream, open air, call if not better and we'll refer to derm if not improved He says there is pus in the blisters now; I do want to see; we will work her in around 9:30 am; no fevers

## 2014-09-16 ENCOUNTER — Emergency Department
Admission: EM | Admit: 2014-09-16 | Discharge: 2014-09-16 | Disposition: A | Discharge status: Other Acute Inpatient Hospital | Payer: Medicare Other | Attending: Emergency Medicine | Admitting: Emergency Medicine

## 2014-09-16 ENCOUNTER — Encounter: Payer: Self-pay | Admitting: Emergency Medicine

## 2014-09-16 ENCOUNTER — Ambulatory Visit (INDEPENDENT_AMBULATORY_CARE_PROVIDER_SITE_OTHER): Payer: Medicare Other | Admitting: Family Medicine

## 2014-09-16 ENCOUNTER — Other Ambulatory Visit: Payer: Self-pay | Admitting: Internal Medicine

## 2014-09-16 ENCOUNTER — Encounter: Payer: Self-pay | Admitting: Family Medicine

## 2014-09-16 ENCOUNTER — Emergency Department: Payer: Medicare Other

## 2014-09-16 VITALS — BP 90/53 | HR 86 | Temp 98.5°F | Ht <= 58 in | Wt 159.0 lb

## 2014-09-16 DIAGNOSIS — L02512 Cutaneous abscess of left hand: Secondary | ICD-10-CM | POA: Diagnosis not present

## 2014-09-16 DIAGNOSIS — Z79899 Other long term (current) drug therapy: Secondary | ICD-10-CM | POA: Diagnosis not present

## 2014-09-16 DIAGNOSIS — L98499 Non-pressure chronic ulcer of skin of other sites with unspecified severity: Secondary | ICD-10-CM | POA: Insufficient documentation

## 2014-09-16 DIAGNOSIS — Z87891 Personal history of nicotine dependence: Secondary | ICD-10-CM | POA: Insufficient documentation

## 2014-09-16 DIAGNOSIS — I10 Essential (primary) hypertension: Secondary | ICD-10-CM | POA: Insufficient documentation

## 2014-09-16 DIAGNOSIS — J9819 Other pulmonary collapse: Secondary | ICD-10-CM | POA: Diagnosis not present

## 2014-09-16 DIAGNOSIS — D509 Iron deficiency anemia, unspecified: Secondary | ICD-10-CM

## 2014-09-16 DIAGNOSIS — D649 Anemia, unspecified: Secondary | ICD-10-CM | POA: Diagnosis not present

## 2014-09-16 DIAGNOSIS — L089 Local infection of the skin and subcutaneous tissue, unspecified: Secondary | ICD-10-CM | POA: Diagnosis present

## 2014-09-16 DIAGNOSIS — L03019 Cellulitis of unspecified finger: Secondary | ICD-10-CM | POA: Insufficient documentation

## 2014-09-16 DIAGNOSIS — L97519 Non-pressure chronic ulcer of other part of right foot with unspecified severity: Secondary | ICD-10-CM

## 2014-09-16 DIAGNOSIS — L03119 Cellulitis of unspecified part of limb: Secondary | ICD-10-CM

## 2014-09-16 DIAGNOSIS — Z7952 Long term (current) use of systemic steroids: Secondary | ICD-10-CM | POA: Insufficient documentation

## 2014-09-16 DIAGNOSIS — L02519 Cutaneous abscess of unspecified hand: Secondary | ICD-10-CM | POA: Insufficient documentation

## 2014-09-16 DIAGNOSIS — L97529 Non-pressure chronic ulcer of other part of left foot with unspecified severity: Secondary | ICD-10-CM

## 2014-09-16 LAB — CBC
HEMATOCRIT: 20.2 % — AB (ref 35.0–47.0)
Hemoglobin: 6.5 g/dL — ABNORMAL LOW (ref 12.0–16.0)
MCH: 27.9 pg (ref 26.0–34.0)
MCHC: 32 g/dL (ref 32.0–36.0)
MCV: 87.4 fL (ref 80.0–100.0)
PLATELETS: 241 10*3/uL (ref 150–440)
RBC: 2.31 MIL/uL — ABNORMAL LOW (ref 3.80–5.20)
RDW: 20.2 % — AB (ref 11.5–14.5)
WBC: 8.3 10*3/uL (ref 3.6–11.0)

## 2014-09-16 LAB — LACTIC ACID, PLASMA
LACTIC ACID, VENOUS: 1.4 mmol/L (ref 0.5–2.0)
LACTIC ACID, VENOUS: 1.2 mmol/L (ref 0.5–2.0)

## 2014-09-16 LAB — HEPATIC FUNCTION PANEL
ALBUMIN: 2.3 g/dL — AB (ref 3.5–5.0)
ALK PHOS: 95 U/L (ref 38–126)
ALT: 9 U/L — ABNORMAL LOW (ref 14–54)
AST: 21 U/L (ref 15–41)
Bilirubin, Direct: 0.2 mg/dL (ref 0.1–0.5)
Indirect Bilirubin: 0.7 mg/dL (ref 0.3–0.9)
TOTAL PROTEIN: 7.7 g/dL (ref 6.5–8.1)
Total Bilirubin: 0.9 mg/dL (ref 0.3–1.2)

## 2014-09-16 LAB — BASIC METABOLIC PANEL
ANION GAP: 8 (ref 5–15)
BUN: 28 mg/dL — ABNORMAL HIGH (ref 6–20)
CHLORIDE: 100 mmol/L — AB (ref 101–111)
CO2: 27 mmol/L (ref 22–32)
CREATININE: 1.94 mg/dL — AB (ref 0.44–1.00)
Calcium: 8.2 mg/dL — ABNORMAL LOW (ref 8.9–10.3)
GFR calc non Af Amer: 25 mL/min — ABNORMAL LOW (ref >60)
GFR, EST AFRICAN AMERICAN: 29 mL/min — AB (ref >60)
Glucose, Bld: 230 mg/dL — ABNORMAL HIGH (ref 65–99)
Potassium: 3.5 mmol/L (ref 3.5–5.1)
SODIUM: 135 mmol/L (ref 135–145)

## 2014-09-16 LAB — SEDIMENTATION RATE: Sed Rate: 125 mm/hr — ABNORMAL HIGH (ref 0–22)

## 2014-09-16 LAB — ABO/RH: ABO/RH(D): AB POS

## 2014-09-16 LAB — TYPE AND SCREEN
ABO/RH(D): AB POS
ANTIBODY SCREEN: NEGATIVE

## 2014-09-16 MED ORDER — SODIUM CHLORIDE 0.9 % IV SOLN: Freq: Once | INTRAVENOUS | Status: DC

## 2014-09-16 MED ORDER — PIPERACILLIN-TAZOBACTAM 3.375 G IVPB
3.3750 g | Freq: Once | INTRAVENOUS | Status: AC
  Administered 2014-09-16: 3.375 g via INTRAVENOUS
  Filled 2014-09-16: qty 50

## 2014-09-16 MED ORDER — VANCOMYCIN HCL IN DEXTROSE 1-5 GM/200ML-% IV SOLN
1000.0000 mg | Freq: Once | INTRAVENOUS | Status: AC
  Administered 2014-09-16: 1000 mg via INTRAVENOUS
  Filled 2014-09-16: qty 200

## 2014-09-16 MED ORDER — SODIUM CHLORIDE 0.9 % IV SOLN
Freq: Once | INTRAVENOUS | Status: AC
  Administered 2014-09-16: 15:00:00 via INTRAVENOUS

## 2014-09-16 NOTE — Assessment & Plan Note (Signed)
Explained that this appears serious and likely needs intravenous antibiotics and surgical irrigation, incision and drainage, and debridement; wounds were covered with sterile gauze and she was sent with her daughter to the ER; check-out given to [facility]

## 2014-09-16 NOTE — ED Notes (Signed)
Pt was sent over by Dr Ma Hillock for further eval of hand blisters. Pt is also scheduled for blood transfusion tomorrow, pt is in remission for lung cancer.

## 2014-09-16 NOTE — Progress Notes (Signed)
BP 90/53 mmHg  Pulse 86  Temp(Src) 98.5 F (36.9 C)  Ht 4' 8.5" (1.435 m)  Wt 159 lb (72.122 kg)  BMI 35.02 kg/m2  SpO2 97%   Subjective:    Patient ID: Katrina Kramer, female    DOB: 1940/07/21, 74 y.o.   MRN: [id number]  HPI: Katrina Kramer is a 74 y.o. female  Chief Complaint  Patient presents with  . Edema    pt has swelling and blisters on both hands after putting on latex gloves, pt states blisters started appearing a few days ago   See phone note from last night; husband called me yesterday to let me know she still had blisters in her hands; she had started putting latex gloves over the blisters and now they have pus in them; she comes in today with her daughter; no fevers; no night sweats  She had been told yesterday that she needs a blood transfusion because she is anemic; she sees Dr. Ma Hillock at the cancer center  Relevant past medical, surgical, family and social history reviewed and updated as indicated. Interim medical history since our last visit reviewed. Allergies and medications reviewed and updated.  Review of Systems Per HPI unless specifically indicated above     Objective:    BP 90/53 mmHg  Pulse 86  Temp(Src) 98.5 F (36.9 C)  Ht 4' 8.5" (1.435 m)  Wt 159 lb (72.122 kg)  BMI 35.02 kg/m2  SpO2 97%  Wt Readings from Last 3 Encounters:  09/16/14 159 lb (72.122 kg)  08/29/14 159 lb (72.122 kg)  05/29/14 163 lb (73.936 kg)    Physical Exam  Constitutional: She appears well-developed.  Elderly female seated in wheelchair; appears tired but nontoxic  Cardiovascular: Normal rate.   Pulmonary/Chest: Effort normal.  Skin:  On the left palm along ulnar aspect, there is a large area of erythema with unroofed blister with yellowish drainage; the entire dorsal aspect of the left hand is swollen with erythema, some tracking along up the radial aspect of the wrist; there is a large abscess along the radial aspect near 2nd digit MCP; along the right palm, large  area of erythema with two foci of purulence, one actively draining yellow pus; the 3rd digit is erythematous and swollen with limited ROM due to swelling with what also appears to be foci of purulence under the skin but no opening and no active drainage  Psychiatric:  Became tearful when she was advised to go to the emergency department; otherwise, good eye contact    Results for orders placed or performed in visit on 09/15/14  CBC with Differential  Result Value Ref Range   WBC 9.6 3.6 - 11.0 K/uL   RBC 2.39 (L) 3.80 - 5.20 MIL/uL   Hemoglobin 6.7 (L) 12.0 - 16.0 g/dL   HCT 20.9 (L) 35.0 - 47.0 %   MCV 87.2 80.0 - 100.0 fL   MCH 27.8 26.0 - 34.0 pg   MCHC 31.9 (L) 32.0 - 36.0 g/dL   RDW 20.6 (H) 11.5 - 14.5 %   Platelets 237 150 - 440 K/uL   Neutrophils Relative % 88 %   Neutro Abs 8.4 (H) 1.4 - 6.5 K/uL   Lymphocytes Relative 5 %   Lymphs Abs 0.5 (L) 1.0 - 3.6 K/uL   Monocytes Relative 7 %   Monocytes Absolute 0.7 0.2 - 0.9 K/uL   Eosinophils Relative 0 %   Eosinophils Absolute 0.0 0 - 0.7 K/uL   Basophils Relative 0 %  Basophils Absolute 0.0 0 - 0.1 K/uL  Basic metabolic panel  Result Value Ref Range   Sodium 131 (L) 135 - 145 mmol/L   Potassium 3.8 3.5 - 5.1 mmol/L   Chloride 97 (L) 101 - 111 mmol/L   CO2 26 22 - 32 mmol/L   Glucose, Bld 203 (H) 65 - 99 mg/dL   BUN 27 (H) 6 - 20 mg/dL   Creatinine, Ser 1.93 (H) 0.44 - 1.00 mg/dL   Calcium 7.7 (L) 8.9 - 10.3 mg/dL   GFR calc non Af Amer 25 (L) >60 mL/min   GFR calc Af Amer 29 (L) >60 mL/min   Anion gap 8 5 - 15  Hepatic function panel  Result Value Ref Range   Total Protein 7.8 6.5 - 8.1 g/dL   Albumin 2.4 (L) 3.5 - 5.0 g/dL   AST 18 15 - 41 U/L   ALT 9 (L) 14 - 54 U/L   Alkaline Phosphatase 96 38 - 126 U/L   Total Bilirubin 0.9 0.3 - 1.2 mg/dL   Bilirubin, Direct 0.2 0.1 - 0.5 mg/dL   Indirect Bilirubin 0.7 0.3 - 0.9 mg/dL  Ferritin  Result Value Ref Range   Ferritin 58 11 - 307 ng/mL  Iron and TIBC  Result  Value Ref Range   Iron 14 (L) 28 - 170 ug/dL   TIBC 223 (L) 250 - 450 ug/dL   Saturation Ratios 6 (L) 10.4 - 31.8 %   UIBC 209 ug/dL      Assessment & Plan:   Problem List Items Addressed This Visit      Other   Anemia    Patient was advised by her hematologist yesterday that she requires a blood transfusion; scheduled for tomorrow; I gave this information to Misty at check-out, explaining that when they get CBC and see counts, they may want to communicate with hematologist about plan; hgb was 6.9      Cellulitis of multiple sites of hand and fingers - Primary    Explained that this appears serious and likely needs intravenous antibiotics and surgical irrigation, incision and drainage, and debridement; wounds were covered with sterile gauze and she was sent with her daughter to the ER; [facility]out given to [facility]      Abscess of multiple sites of hand and fingers    Explained that this appears serious and likely needs intravenous antibiotics and surgical irrigation, incision and drainage, and debridement; wounds were covered with sterile gauze and she was sent with her daughter to the ER; check-out given to [facility]         Follow up plan: No Follow-up on file.

## 2014-09-16 NOTE — Consult Note (Addendum)
[facility] [place] NAME: Katrina Kramer    MR#:  [id number]  DATE OF BIRTH:  [birth year]  DATE OF ADMISSION:  09/16/2014  PRIMARY CARE PHYSICIAN: Enid Derry, MD   REQUESTING/REFERRING PHYSICIAN:Dr Malinda  CHIEF COMPLAINT:  Hand infection HISTORY OF PRESENT ILLNESS:  Katrina Kramer  is a 74 y.o. female with a known history of atrial fibrillation, lung cancer and anemia of chronic disease who presents from her PCP office with above complaint. Patient reports approximately 2-3 days ago she used dishwashing detergent and subsequently developed a rash. She saw her primary care physician who prescribed a cream. Since that time she's been wearing gloves which may be latex gloves and this morning she took them off after wearing them for 3 days and noticed purulent discharge from both of her hands. Her PCP asked her to come to the ER for further evaluation. She was started on medical mycin and Zosyn  PAST MEDICAL HISTORY:   Past Medical History  Diagnosis Date  . Atrial fibrillation     Dr. Humphrey Rolls, cardiologist  . Anemia   . Hypothyroidism   . Lung cancer   . Hypertension   . Lung cancer   . Anemia     PAST SURGICAL HISTORY:   Past Surgical History  Procedure Laterality Date  . Upper gastrointestinal endoscopy  02/24/14    SOCIAL HISTORY:   Social History  Substance Use Topics  . Smoking status: Former Research scientist (life sciences)  . Smokeless tobacco: Never Used  . Alcohol Use: No    FAMILY HISTORY:   Family History  Problem Relation Age of Onset  . Cancer Mother     stomach  . Alzheimer's disease Father   . Varicose Veins Daughter     DRUG ALLERGIES:  No Known Allergies   REVIEW OF SYSTEMS:  CONSTITUTIONAL: No fever, fatigue or weakness.  EYES: No blurred or double vision.  EARS, NOSE, AND THROAT: No tinnitus or ear pain.  RESPIRATORY: No cough, shortness of breath, wheezing or hemoptysis.  CARDIOVASCULAR: No chest pain, orthopnea,  edema.  GASTROINTESTINAL: No nausea, vomiting, diarrhea or abdominal pain.  GENITOURINARY: No dysuria, hematuria.  ENDOCRINE: No polyuria, nocturia,  HEMATOLOGY: Positive anemia, no easy bruising or bleeding SKIN: Cellulitis and purulent drainage from her blisters on her hands MUSCULOSKELETAL: No joint pain or arthritis.   NEUROLOGIC: No tingling, numbness, weakness.  PSYCHIATRY: No anxiety or depression.   MEDICATIONS AT HOME:   Prior to Admission medications   Medication Sig Start Date End Date Taking? Authorizing Provider  albuterol (PROVENTIL HFA;VENTOLIN HFA) 108 (90 BASE) MCG/ACT inhaler Inhale 2 puffs into the lungs every 6 (six) hours as needed for wheezing or shortness of breath.   Yes Historical Provider, MD  albuterol (PROVENTIL) (2.5 MG/3ML) 0.083% nebulizer solution Take 2.5 mg by nebulization every 4 (four) hours as needed for wheezing or shortness of breath.   Yes Historical Provider, MD  amiodarone (PACERONE) 200 MG tablet Take 200 mg by mouth daily.   Yes Historical Provider, MD  atorvastatin (LIPITOR) 20 MG tablet Take 20 mg by mouth at bedtime.    Yes Historical Provider, MD  benzonatate (TESSALON) 100 MG capsule Take 100 mg by mouth 3 (three) times daily as needed for cough.   Yes Lloyd Huger, MD  cetirizine (ZYRTEC) 10 MG tablet Take 10 mg by mouth 3 (three) times daily.   Yes Historical Provider, MD  ferrous sulfate 325 (65 FE) MG tablet Take 325 mg  by mouth 3 (three) times daily.   Yes Historical Provider, MD  folic acid (FOLVITE) 1 MG tablet Take 1 mg by mouth daily.   Yes Historical Provider, MD  gabapentin (NEURONTIN) 100 MG capsule Take 1 capsule (100 mg total) by mouth 2 (two) times daily. 09/04/14  Yes Arnetha Courser, MD  guaifenesin (ROBITUSSIN) 100 MG/5ML syrup Take 100 mg by mouth every 6 (six) hours as needed for cough.   Yes Historical Provider, MD  isosorbide mononitrate (IMDUR) 30 MG 24 hr tablet Take 30 mg by mouth daily.   Yes Historical Provider, MD   liothyronine (CYTOMEL) 25 MCG tablet Take 1 tablet (25 mcg total) by mouth daily. 07/12/14  Yes Arnetha Courser, MD  pantoprazole (PROTONIX) 40 MG tablet Take 40 mg by mouth 2 (two) times daily.    Yes Historical Provider, MD  potassium chloride SA (K-DUR,KLOR-CON) 20 MEQ tablet Take 1 tablet (20 mEq total) by mouth daily. 08/29/14  Yes Arnetha Courser, MD  predniSONE (DELTASONE) 5 MG tablet Take 2.5 mg by mouth daily with breakfast.   Yes Historical Provider, MD  torsemide (DEMADEX) 10 MG tablet Take 1.5 tablets (15 mg total) by mouth daily. 07/31/14  Yes Arnetha Courser, MD  triamcinolone cream (KENALOG) 0.1 % Apply 1 application topically 2 (two) times daily as needed (for rash).   Yes Historical Provider, MD  triamcinolone cream (KENALOG) 0.5 % Apply 1 application topically 2 (two) times daily as needed. Patient not taking: Reported on 09/16/2014 08/29/14   Arnetha Courser, MD      VITAL SIGNS:  Blood pressure 96/43, pulse 84, temperature 98 F (36.7 C), temperature source Oral, resp. rate 18, height 4' 8.5" (1.435 m), weight 68.947 kg (152 lb), SpO2 100 %.  PHYSICAL EXAMINATION:  GENERAL:  74 y.o.-year-old patient lying in the bed with no acute distress.  EYES: Pupils equal, round, reactive to light and accommodation. No scleral icterus. Extraocular muscles intact.  HEENT: Head atraumatic, normocephalic. Oropharynx and nasopharynx clear.  NECK:  Supple, no jugular venous distention. No thyroid enlargement, no tenderness.  LUNGS: Normal breath sounds bilaterally, no wheezing, rales,rhonchi or crepitation. No use of accessory muscles of respiration.  CARDIOVASCULAR: S1, S2 normal. No murmurs, rubs, or gallops.  ABDOMEN: Soft, nontender, nondistended. Bowel sounds present. No organomegaly or mass.  EXTREMITIES: No pedal edema, cyanosis, or clubbing.  NEUROLOGIC: Cranial nerves II through XII are grossly intact. No focal deficits. PSYCHIATRIC: The patient is alert and oriented x 3.  SKIN: She has  purulent drainage from blisters on her hands on the palmar side as well as third ring finger.Marland Kitchen   LABORATORY PANEL:   CBC  Recent Labs Lab 09/16/14 1110  WBC 8.3  HGB 6.5*  HCT 20.2*  PLT 241   ------------------------------------------------------------------------------------------------------------------  Chemistries   Recent Labs Lab 09/16/14 1110  NA 135  K 3.5  CL 100*  CO2 27  GLUCOSE 230*  BUN 28*  CREATININE 1.94*  CALCIUM 8.2*  AST 21  ALT 9*  ALKPHOS 95  BILITOT 0.9   ------------------------------------------------------------------------------------------------------------------  Cardiac Enzymes No results for input(s): TROPONINI in the last 168 hours. ------------------------------------------------------------------------------------------------------------------  RADIOLOGY:  Dg Chest 2 View  09/16/2014   CLINICAL DATA:  Current history of right upper lobe lung cancer, presenting with edema, erythema and blistering of both hands over the past 2 days.  EXAM: CHEST  2 VIEW  COMPARISON:  Chest x-rays 06/02/2014 and earlier. CT chest 07/02/2014, 12/03/2013.  FINDINGS: AP sitting erect and  lateral images were obtained. New dense right middle lobe and right lower lobe atelectasis. 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 Linear scarring in the inferior left upper lobe, unchanged. Left lung otherwise clear. No visible pleural effusions.  Cardiac silhouette moderately enlarged, unchanged, allowing for differences in technique. Thoracic aorta atherosclerotic, unchanged. Degenerative changes and DISH involving the thoracic spine.  IMPRESSION: 1. Dense right middle lobe and right lower lobe atelectasis, a new finding since the most recent chest CT 07/02/2014. 2. Stable linear scarring in the left upper lobe. Left lung otherwise clear. 3. Stable cardiomegaly without evidence of pulmonary edema.   Electronically Signed   By: Evangeline Dakin M.D.   On: 09/16/2014 15:18   Dg Hand Complete Left  09/16/2014   CLINICAL DATA:  Blisters, redness and swelling of the hands for 2 days, no known injury, lung cancer patient in remission, atrial fibrillation, hypertension  EXAM: LEFT HAND - COMPLETE 3+ VIEW  COMPARISON:  None  FINDINGS: Fingers flexed on all views and superimposed on lateral view limiting assessment.  Osseous demineralization.  Diffuse soft tissue swelling LEFT hand.  Mild degenerative changes at first MCP joint and at IP joint thumb.  No acute fracture, dislocation or bone destruction.  IMPRESSION: Osseous demineralization with mild degenerative changes LEFT thumb.  Diffuse soft tissue swelling without acute osseous abnormalities.   Electronically Signed   By: Lavonia Dana M.D.   On: 09/16/2014 15:16   Dg Hand Complete Right  09/16/2014   CLINICAL DATA:  Blisters, redness and swelling of her hands for 2 days, denies injury, lung cancer patient in remission, draining wounds, atrial fibrillation, hypertension  EXAM: RIGHT HAND - COMPLETE 3+ VIEW  COMPARISON:  None  FINDINGS: Diffuse osseous demineralization.  Minimal spur formation and joint space narrowing at RIGHT first MCP joint.  Mild degenerative changes at IP joint thumb.  No acute fracture, dislocation or bone destruction.  IMPRESSION: Degenerative changes RIGHT thumb.  Osseous demineralization.  No acute abnormalities.   Electronically Signed   By: Lavonia Dana M.D.   On: 09/16/2014 15:14    EKG:   IMPRESSION AND PLAN:  74 year old female with lung cancer and atrial fibrillation who presents with cellulitis/blisters on her hands.  1. Blisters/cellulitis and hands: Patient should continue broad-spectrum antibiotics including vancomycin and Zosyn. Orthopedics has been consulted for their opinion on incision and drainage. Due to the extensive nature of the abscess that requires several I and D's patient is probably best served at another institution where there is Interior and spatial designer.  2. Anemia of chronic disease: Patient is scheduled for a blood transfusion tomorrow as per her ONCOLOGIST and should receive 1 unit of blood. Continue ferrous sulfate. Anemia panel from September 12 reveals low iron and saturation with a low ferritin/iron storage. Patient does follow-up with Dr. Ma Hillock 3. Atrial fibrillation: Patient will continue on amiodarone.  4. Hyperlipidemia: Continue atorvastatin  5. Hypotension: Possibly from a combination of cellulitis and anemia. Blood pressure medication will be held. IV fluid should be started.  6. History of lung cancer: Patient is in remission on chronic steroids.  All the records are reviewed and case discussed with ED provider. Management plans discussed with the patient and she is in agreement.  CODE STATUS: FULL  TOTAL TIME TAKING CARE OF THIS PATIENT: 50 minutes.   Patient should be transferred to another institution with hand surgeon/specialist. Case discussed with Dr Marry Guan and Dr Karma Greaser.   Anyela Napierkowski M.D on  09/16/2014 at 6:52 PM  Between 7am to 6pm - Pager - [phone] After 6pm go to [email or web address] - password [email or web address] [facility]  Office  [phone]  CC: Primary care physician; Enid Derry, MD    h

## 2014-09-16 NOTE — Assessment & Plan Note (Addendum)
Patient was advised by her hematologist yesterday that she requires a blood transfusion; scheduled for tomorrow; I gave this information to [facility], explaining that when they get CBC and see counts, they may want to communicate with hematologist about plan; hgb was 6.9

## 2014-09-16 NOTE — Patient Instructions (Signed)
Sent straight to ER

## 2014-09-16 NOTE — ED Notes (Signed)
Pt sent over by md for blisters and redness/swelling on her hands x 2 days. She denies injury.  Pt is supposed to see dr pandit tomorrow for possible blood transfusion. She is lung cancer pt in remission. Pt denies fever or blisters anywhere else.Pt alert & oriented with warm, dry skin.

## 2014-09-16 NOTE — ED Notes (Signed)
Dr Marry Guan at bedside

## 2014-09-16 NOTE — ED Notes (Signed)
Dr Marry Guan and Dr. Benjie Karvonen at bedside

## 2014-09-16 NOTE — Assessment & Plan Note (Signed)
Explained that this appears serious and likely needs intravenous antibiotics and surgical irrigation, incision and drainage, and debridement; wounds were covered with sterile gauze and she was sent with her daughter to the ER; check-out given to [facility]

## 2014-09-16 NOTE — ED Provider Notes (Addendum)
[facility] Emergency Department Provider Note  ____________________________________________  Time seen: Approximately 2:21 PM  I have reviewed the triage vital signs and the nursing notes.   HISTORY  Chief Complaint Wound Infection and Anemia    HPI Katrina Kramer is a 74 y.o. female patient sent from [facility] clinic for evaluation and hands. Patient reports 2-3 days ago she began having some red swollen and tender areas on her hands. These since have had some skin breakdown and using white material coming out of them looks like pus. There is 1 at the base of the right hand. On the palmar surface and then she has several on the left hand including one on the right index finger on the dorsal surface and the long finger is swollen. Several other lesions on the palm of the left hand.   Past Medical History  Diagnosis Date  . Atrial fibrillation     Dr. Humphrey Rolls, cardiologist  . Anemia   . Hypothyroidism   . Lung cancer   . Hypertension   . Lung cancer   . Anemia     Patient Active Problem List   Diagnosis Date Noted  . Cellulitis of multiple sites of hand and fingers 09/16/2014  . Abscess of multiple sites of hand and fingers 09/16/2014  . Drug-induced hypokalemia 08/31/2014  . Acid reflux 08/31/2014  . Irritant hand dermatitis 08/31/2014  . Atrial fibrillation   . Anemia   . Hypothyroidism   . Lung cancer   . Hypertension   . IDA (iron deficiency anemia) 07/04/2014  . H/O upper gastrointestinal hemorrhage 03/26/2014  . CAFL (chronic airflow limitation) 09/24/2013    Past Surgical History  Procedure Laterality Date  . Upper gastrointestinal endoscopy  02/24/14    Current Outpatient Rx  Name  Route  Sig  Dispense  Refill  . cetirizine (ZYRTEC) 10 MG tablet   Oral   Take 10 mg by mouth 3 (three) times daily.         . ferrous sulfate 325 (65 FE) MG tablet   Oral   Take 325 mg by mouth 3 (three) times daily.         . predniSONE (DELTASONE)  5 MG tablet   Oral   Take 2.5 mg by mouth daily with breakfast.         . triamcinolone cream (KENALOG) 0.1 %   Topical   Apply 1 application topically 2 (two) times daily as needed (for rash).         Marland Kitchen albuterol (PROAIR HFA) 108 (90 BASE) MCG/ACT inhaler   Inhalation   Inhale 2 puffs into the lungs every 6 (six) hours as needed for wheezing or shortness of breath.         Marland Kitchen albuterol (PROVENTIL) (2.5 MG/3ML) 0.083% nebulizer solution      USE 1 VIAL IN NEBULIZER EVERY 4 HOURS WHILE AWAKE AS NEEDED FOR SHORTNESS OF BREATH OR WHEEZING   360 mL   0   . amiodarone (PACERONE) 200 MG tablet   Oral   Take 200 mg by mouth daily.         Marland Kitchen atorvastatin (LIPITOR) 20 MG tablet   Oral   Take 20 mg by mouth at bedtime.          . benzonatate (TESSALON) 100 MG capsule   Oral   Take 100 mg by mouth 3 (three) times daily as needed for cough.         . carvedilol (COREG)  12.5 MG tablet   Oral   Take 6.25 mg by mouth 2 (two) times daily with a meal.         . chlorpheniramine (CHLOR-TRIMETON) 4 MG tablet   Oral   Take 4 mg by mouth 2 (two) times daily as needed for allergies.         Marland Kitchen gabapentin (NEURONTIN) 100 MG capsule   Oral   Take 1 capsule (100 mg total) by mouth 2 (two) times daily.   60 capsule   6   . guaiFENesin-codeine (ROBITUSSIN AC) 100-10 MG/5ML syrup   Oral   Take 5 mLs by mouth 3 (three) times daily as needed for cough.         . isosorbide mononitrate (IMDUR) 30 MG 24 hr tablet   Oral   Take 30 mg by mouth daily.         Marland Kitchen liothyronine (CYTOMEL) 25 MCG tablet   Oral   Take 1 tablet (25 mcg total) by mouth daily.   30 tablet   5   . pantoprazole (PROTONIX) 40 MG tablet   Oral   Take 40 mg by mouth 2 (two) times daily.          . potassium chloride SA (K-DUR,KLOR-CON) 20 MEQ tablet   Oral   Take 1 tablet (20 mEq total) by mouth daily.         Marland Kitchen torsemide (DEMADEX) 10 MG tablet   Oral   Take 1.5 tablets (15 mg total) by mouth  daily.   135 tablet   0   . triamcinolone cream (KENALOG) 0.5 %   Topical   Apply 1 application topically 2 (two) times daily as needed. Patient not taking: Reported on 09/16/2014   30 g   0     Allergies Review of patient's allergies indicates no known allergies.  Family History  Problem Relation Age of Onset  . Cancer Mother     stomach  . Alzheimer's disease Father   . Varicose Veins Daughter     Social History Social History  Substance Use Topics  . Smoking status: Former Research scientist (life sciences)  . Smokeless tobacco: Never Used  . Alcohol Use: No    Review of Systems Constitutional: No fever/chills Eyes: No visual changes. ENT: No sore throat. Cardiovascular: Denies chest pain. Respiratory: Denies shortness of breath. Gastrointestinal: No abdominal pain.  No nausea, no vomiting.  No diarrhea.  No constipation. Genitourinary: Negative for dysuria. Musculoskeletal: Negative for back pain. Skin: Negative for rash. Neurological: Negative for headaches, focal weakness or numbness.  10-point ROS otherwise negative.  ____________________________________________   PHYSICAL EXAM:  VITAL SIGNS: ED Triage Vitals  Enc Vitals Group     BP 09/16/14 1053 96/43 mmHg     Pulse Rate 09/16/14 1053 84     Resp 09/16/14 1053 18     Temp 09/16/14 1053 98 F (36.7 C)     Temp Source 09/16/14 1053 Oral     SpO2 09/16/14 1053 100 %     Weight 09/16/14 1053 152 lb (68.947 kg)     Height 09/16/14 1053 4' 8.5" (1.435 m)     Head Cir --      Peak Flow --      Pain Score 09/16/14 1059 4     Pain Loc --      Pain Edu? --      Excl. in [place]? --     Constitutional: Alert and oriented. Well appearing and in no acute distress.  Eyes: Conjunctivae are normal. PERRL. EOMI. Head: Atraumatic. Nose: No congestion/rhinnorhea. Mouth/Throat: Mucous membranes are moist.  Oropharynx non-erythematous. Neck: No stridor. Cardiovascular: Normal rate, regular rhythm. Grossly normal heart sounds.  Good  peripheral circulation. Respiratory: Normal respiratory effort.  No retractions. Lungs CTAB. Gastrointestinal: Soft and nontender. No distention. No abdominal bruits. No CVA tenderness. Musculoskeletal: No lower extremity tenderness nor edema.  No joint effusions. Neurologic:  Normal speech and language. No gross focal neurologic deficits are appreciated. No gait instability. Skin:  Skin is warm, dry and intact. No rash noted. Except for as noted in history of present illness and hands Psychiatric: Mood and affect are normal. Speech and behavior are normal.  ____________________________________________   LABS (all labs ordered are listed, but only abnormal results are displayed)  Labs Reviewed  CBC - Abnormal; Notable for the following:    RBC 2.31 (*)    Hemoglobin 6.5 (*)    HCT 20.2 (*)    RDW 20.2 (*)    All other components within normal limits  BASIC METABOLIC PANEL - Abnormal; Notable for the following:    Chloride 100 (*)    Glucose, Bld 230 (*)    BUN 28 (*)    Creatinine, Ser 1.94 (*)    Calcium 8.2 (*)    GFR calc non Af Amer 25 (*)    GFR calc Af Amer 29 (*)    All other components within normal limits  HEPATIC FUNCTION PANEL - Abnormal; Notable for the following:    Albumin 2.3 (*)    ALT 9 (*)    All other components within normal limits  CULTURE, GROUP A STREP ([facility] ONLY)  LACTIC ACID, PLASMA  LACTIC ACID, PLASMA  SEDIMENTATION RATE  TYPE AND SCREEN  ABO/RH   ____________________________________________  EKG  _____________________________________  RADIOLOGY   ____________________________________________   PROCEDURES    ____________________________________________   INITIAL IMPRESSION / ASSESSMENT AND PLAN / ED COURSE  Pertinent labs & imaging results that were available during my care of the patient were reviewed by me and considered in my medical decision making (see chart for details). I am waiting for her labs and will sign out the  patient to Dr. for back ____________________________________________   FINAL CLINICAL IMPRESSION(S) / ED DIAGNOSES  Final diagnoses:  Skin ulcers of both feet  Anemia, unspecified anemia type  Lung collapse   diagnosis skin ulcers of both hands unfortunately this diagnosis does not seem pre-existing Epic that I can find.    Nena Polio, MD 09/16/14 [phone]  Patient's family comes in reports that she's been wearing a blue plastic gloves Route 3 days almost nonstop. When she took the gloves off she had the ulcers. She had put them on and put some cream on them because she had a reaction to some detergent she was wearing she was using to wash dishes.  Patient's other diagnoses include lung collapse on the right side and severe anemia.  Nena Polio, MD 09/16/14 [phone]

## 2014-09-16 NOTE — ED Provider Notes (Signed)
-----------------------------------------   4:29 PM on 09/16/2014 -----------------------------------------   Blood pressure 96/43, pulse 84, temperature 98 F (36.7 C), temperature source Oral, resp. rate 18, height 4' 8.5" (1.435 m), weight 152 lb (68.947 kg), SpO2 100 %.  Assuming care from Dr. Cinda Quest.  In short, Katrina Kramer is a 74 y.o. female with a chief complaint of Wound Infection and Anemia .  Refer to the original H&P for additional details.  The current plan of care is to await orthopedic evaluation by Dr. Marry Guan for recommendations about inpatient admission at [facility] versus transfer to a center with a hand surgeon.  ----------------------------------------- 7:29 PM on 09/16/2014 -----------------------------------------  The patient has remained stable throughout nearly 9 hours in the emergency department.  She was given empiric vancomycin and Zosyn.  Dr. Marry Guan evaluated the patient in the emergency department and is concerned that the extent of infection in her left hand will require washout and that it exceeds the capabilities we have at this facility and that she would benefit from a hand surgeon.  I discussed that at bedside with Dr. Marry Guan as well as with the patient and her family.  They agreed to transfer to [facility].  Discussed the case by phone with Dr. Evelena Leyden at [facility] emergency Department who accepted the patient is a transfer.  We are sending all appropriate records and documentation.  The patient is stable for transfer at this time.   Hinda Kehr, MD 09/16/14 [phone]

## 2014-09-17 ENCOUNTER — Inpatient Hospital Stay: Payer: Medicare Other

## 2014-09-17 ENCOUNTER — Encounter: Payer: Self-pay | Admitting: Internal Medicine

## 2014-09-17 ENCOUNTER — Inpatient Hospital Stay (HOSPITAL_BASED_OUTPATIENT_CLINIC_OR_DEPARTMENT_OTHER): Payer: Medicare Other | Admitting: Internal Medicine

## 2014-09-17 VITALS — BP 108/71 | HR 80 | Temp 96.2°F | Resp 18

## 2014-09-17 VITALS — BP 101/62 | HR 83 | Temp 97.8°F | Resp 18 | Ht <= 58 in | Wt 152.0 lb

## 2014-09-17 DIAGNOSIS — D649 Anemia, unspecified: Secondary | ICD-10-CM

## 2014-09-17 DIAGNOSIS — R531 Weakness: Secondary | ICD-10-CM

## 2014-09-17 DIAGNOSIS — Z79899 Other long term (current) drug therapy: Secondary | ICD-10-CM

## 2014-09-17 DIAGNOSIS — D509 Iron deficiency anemia, unspecified: Secondary | ICD-10-CM

## 2014-09-17 DIAGNOSIS — C3411 Malignant neoplasm of upper lobe, right bronchus or lung: Secondary | ICD-10-CM

## 2014-09-17 DIAGNOSIS — J069 Acute upper respiratory infection, unspecified: Secondary | ICD-10-CM

## 2014-09-17 LAB — PREPARE RBC (CROSSMATCH)

## 2014-09-17 MED ORDER — HEPARIN SOD (PORK) LOCK FLUSH 100 UNIT/ML IV SOLN
500.0000 [IU] | Freq: Every day | INTRAVENOUS | Status: AC | PRN
  Administered 2014-09-17: 500 [IU]
  Filled 2014-09-17: qty 5

## 2014-09-17 MED ORDER — ACETAMINOPHEN 325 MG PO TABS
650.0000 mg | ORAL_TABLET | Freq: Once | ORAL | Status: AC
  Administered 2014-09-17: 650 mg via ORAL
  Filled 2014-09-17: qty 2

## 2014-09-17 MED ORDER — SODIUM CHLORIDE 0.9 % IJ SOLN
10.0000 mL | INTRAMUSCULAR | Status: AC | PRN
  Administered 2014-09-17: 10 mL
  Filled 2014-09-17: qty 10

## 2014-09-17 MED ORDER — SODIUM CHLORIDE 0.9 % IV SOLN
250.0000 mL | Freq: Once | INTRAVENOUS | Status: AC
  Administered 2014-09-17: 250 mL via INTRAVENOUS
  Filled 2014-09-17: qty 250

## 2014-09-17 MED ORDER — DIPHENHYDRAMINE HCL 25 MG PO CAPS
25.0000 mg | ORAL_CAPSULE | Freq: Once | ORAL | Status: AC
  Administered 2014-09-17: 25 mg via ORAL
  Filled 2014-09-17: qty 1

## 2014-09-17 NOTE — Progress Notes (Signed)
Port-a-cath does not yield a blood return. Port-a-cath does flush without difficulty. No swelling, redness, or pain noted at site. MD, Dr. Ma Hillock, notified and aware. Per MD, Dr. Ma Hillock, order: may use port-a-cath for blood transfusion today.

## 2014-09-17 NOTE — Progress Notes (Signed)
Pt has wound on left hand that has I&D last night at [facility] and it is dressed and given atb to take for 7 days Bactrim 1 bid.  They did not have time to get it filled yet. They left ER 4 am today.  Pt feels weak and tire. No blood seen in urine or stool, but she takes iron pills

## 2014-09-18 ENCOUNTER — Other Ambulatory Visit: Payer: Self-pay | Admitting: *Deleted

## 2014-09-18 DIAGNOSIS — Z23 Encounter for immunization: Secondary | ICD-10-CM

## 2014-09-18 LAB — TYPE AND SCREEN
ABO/RH(D): AB POS
Antibody Screen: NEGATIVE
Unit division: 0

## 2014-09-18 LAB — CULTURE, GROUP A STREP (THRC)

## 2014-09-24 ENCOUNTER — Inpatient Hospital Stay: Payer: Medicare Other

## 2014-09-24 VITALS — BP 90/59 | HR 91 | Temp 96.7°F | Resp 16

## 2014-09-24 DIAGNOSIS — D649 Anemia, unspecified: Secondary | ICD-10-CM

## 2014-09-24 DIAGNOSIS — C3411 Malignant neoplasm of upper lobe, right bronchus or lung: Secondary | ICD-10-CM | POA: Diagnosis not present

## 2014-09-24 DIAGNOSIS — Z23 Encounter for immunization: Secondary | ICD-10-CM

## 2014-09-24 DIAGNOSIS — D509 Iron deficiency anemia, unspecified: Secondary | ICD-10-CM

## 2014-09-24 LAB — CBC WITH DIFFERENTIAL/PLATELET
Basophils Absolute: 0 10*3/uL (ref 0–0.1)
Basophils Relative: 1 %
EOS PCT: 1 %
Eosinophils Absolute: 0.1 10*3/uL (ref 0–0.7)
HCT: 25.8 % — ABNORMAL LOW (ref 35.0–47.0)
HEMOGLOBIN: 8.8 g/dL — AB (ref 12.0–16.0)
LYMPHS ABS: 0.7 10*3/uL — AB (ref 1.0–3.6)
LYMPHS PCT: 15 %
MCH: 29.7 pg (ref 26.0–34.0)
MCHC: 34.2 g/dL (ref 32.0–36.0)
MCV: 86.8 fL (ref 80.0–100.0)
MONOS PCT: 6 %
Monocytes Absolute: 0.3 10*3/uL (ref 0.2–0.9)
Neutro Abs: 3.7 10*3/uL (ref 1.4–6.5)
Neutrophils Relative %: 77 %
PLATELETS: 348 10*3/uL (ref 150–440)
RBC: 2.98 MIL/uL — AB (ref 3.80–5.20)
RDW: 20.4 % — ABNORMAL HIGH (ref 11.5–14.5)
WBC: 4.8 10*3/uL (ref 3.6–11.0)

## 2014-09-24 LAB — FERRITIN: Ferritin: 56 ng/mL (ref 11–307)

## 2014-09-24 LAB — IRON AND TIBC
Iron: 48 ug/dL (ref 28–170)
SATURATION RATIOS: 15 % (ref 10.4–31.8)
TIBC: 313 ug/dL (ref 250–450)
UIBC: 265 ug/dL

## 2014-09-24 LAB — SAMPLE TO BLOOD BANK

## 2014-09-24 MED ORDER — SODIUM CHLORIDE 0.9 % IV SOLN
INTRAVENOUS | Status: DC
  Administered 2014-09-24: 10:00:00 via INTRAVENOUS
  Filled 2014-09-24: qty 1000

## 2014-09-24 MED ORDER — SODIUM CHLORIDE 0.9 % IJ SOLN
10.0000 mL | INTRAMUSCULAR | Status: DC | PRN
  Administered 2014-09-24: 10 mL via INTRAVENOUS
  Filled 2014-09-24: qty 10

## 2014-09-24 MED ORDER — INFLUENZA VAC SPLIT QUAD 0.5 ML IM SUSY
0.5000 mL | PREFILLED_SYRINGE | Freq: Once | INTRAMUSCULAR | Status: AC
  Administered 2014-09-24: 0.5 mL via INTRAMUSCULAR
  Filled 2014-09-24: qty 0.5

## 2014-09-24 MED ORDER — HEPARIN SOD (PORK) LOCK FLUSH 100 UNIT/ML IV SOLN
500.0000 [IU] | Freq: Once | INTRAVENOUS | Status: AC
  Administered 2014-09-24: 500 [IU] via INTRAVENOUS
  Filled 2014-09-24: qty 5

## 2014-09-24 MED ORDER — SODIUM CHLORIDE 0.9 % IV SOLN
500.0000 mg | Freq: Once | INTRAVENOUS | Status: AC
  Administered 2014-09-24: 500 mg via INTRAVENOUS
  Filled 2014-09-24: qty 25

## 2014-09-25 ENCOUNTER — Ambulatory Visit: Payer: Medicare Other

## 2014-09-26 DIAGNOSIS — L02519 Cutaneous abscess of unspecified hand: Secondary | ICD-10-CM | POA: Insufficient documentation

## 2014-10-05 NOTE — Progress Notes (Signed)
[facility]  [phone] [phone]     ID: Frann Rider OB: 08/31/1940  MR#: [id number]  CBJ#:[id number]  Patient Care Team: Arnetha Courser, MD as PCP - General (Family Medicine)  CHIEF COMPLAINT/DIAGNOSIS:  1. 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 CT-guided biopsy on 02/10/11 - insufficient for definite diagnosis. Rare group of atypical cells. Got chemotherapy with Paclitaxel/Carboplatin (started on 02/14/11, completed cycle 6 day 8 on 08/22/11). Also got concurrent radiation. 01/18/12 - Underwent Preoperative bronchoscopy with bronchoalveolar lavage right upper lobe. Right thoracoscopy with pleural biopsy and talc pleurodesis. Negative for malignancy. PET scan on 03/29/12  IMPRESSION: Multifocal pleural activity in the right chest suggesting pleural tumor spread. Activity is also noted in the right hilar region suggesting persistent tumor in this region. April 04, 2012 - VERISTRAT test reported as 'GOOD'.  Started Tarceva on 05/17/12. July 2014 - declining performance status, unable to tolerate low dose Tarceva, was on home hospice which was discontinued since patient doing steady.  2. Chronic anemia along with iron deficiency. Gets intermittent IV Venofer 500 mg and continue on oral iron. Likely has a component of anemia of chronic renal insufficiency. December 2015 - stool Hemoccult positive. 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 Colonoscopy reported internal nonbleeding hemorrhoids).   HISTORY OF PRESENT ILLNESS:  Patient returns for continued oncology followup, she was seen in July. States that she continues to significant weakness and dyspnea on exertion which is also chronic. She has history  of recurrent nonsmall cell lung cancer. States she does ambulate slowly inside home and does some household activity. She continues to have bothersome cough, she had chest x-ray done yesterday which was suggestive of possible pneumonia on the right side. Sputum culture is growing heavy growth group B streptococcus, states that she has started on oral anti-biotic, otherwise takes tessalon perles for the cough. No hemoptysis or chest pain. Appetite is good, no unintentional weight loss. No fevers, chills. Memory is chronically poor, patient accompanied by husband today who feels that she is doing about the same overall.    REVIEW OF SYSTEMS:   ROS As in HPI above. In addition, no new headaches or seizures. No sore throat or dysphagia. No chest pain or palpitation. No abdominal pain, constipation, diarrhea, dysuria or hematuria. No new skin rash or bleeding symptoms. No polyuria or polydipsia. PS ECOG 2.  PAST MEDICAL HISTORY: Reviewed.         COPD, ex-smoker   Hypertension  Iron deficiency anemia  Diverticulosis  GERD, history of gastric ulcers  EGD and colonoscopy 2010  Bronchoscopy January 2013 showed non-small cell carcinoma  Gallstone pancreatitis (acute) April 2013, status post procedure at [facility]  PAST SURGICAL HISTORY: Reviewed. As above  FAMILY HISTORY: Reviewed. Her mother died of cancer of unknown primary, uncle had lung cancer.  Otherwise noncontributory.  SOCIAL HISTORY: Reviewed. Social History  Substance Use Topics  . Smoking status: Former Research scientist (life sciences)  . Smokeless tobacco: Never Used  . Alcohol Use: No    No Known Allergies  Current Outpatient Prescriptions  Medication Sig Dispense Refill  . albuterol (PROVENTIL HFA;VENTOLIN HFA) 108 (90 BASE) MCG/ACT inhaler Inhale 2 puffs into the lungs every 6 (six) hours as needed for wheezing or shortness of breath.    Marland Kitchen albuterol (PROVENTIL) (2.5  MG/3ML) 0.083% nebulizer solution Take 2.5 mg by nebulization every 4 (four) hours as  needed for wheezing or shortness of breath.    Marland Kitchen amiodarone (PACERONE) 200 MG tablet Take 200 mg by mouth daily.    Marland Kitchen atorvastatin (LIPITOR) 20 MG tablet Take 20 mg by mouth at bedtime.     . benzonatate (TESSALON) 100 MG capsule Take 100 mg by mouth 3 (three) times daily as needed for cough.    . carvedilol (COREG) 12.5 MG tablet Take 12.5 mg by mouth daily. 1/2 pill a day    . chlorpheniramine (CHLOR-TRIMETON) 4 MG tablet Take 4 mg by mouth 3 (three) times daily.    . ferrous sulfate 325 (65 FE) MG tablet Take 325 mg by mouth 3 (three) times daily.    . folic acid (FOLVITE) 1 MG tablet Take 1 mg by mouth daily.    Marland Kitchen gabapentin (NEURONTIN) 100 MG capsule Take 1 capsule (100 mg total) by mouth 2 (two) times daily. 60 capsule 6  . guaifenesin (ROBITUSSIN) 100 MG/5ML syrup Take 100 mg by mouth every 6 (six) hours as needed for cough.    . isosorbide mononitrate (IMDUR) 30 MG 24 hr tablet Take 30 mg by mouth daily.    Marland Kitchen liothyronine (CYTOMEL) 25 MCG tablet Take 1 tablet (25 mcg total) by mouth daily. 30 tablet 5  . pantoprazole (PROTONIX) 40 MG tablet Take 40 mg by mouth 2 (two) times daily.     . potassium chloride SA (K-DUR,KLOR-CON) 20 MEQ tablet Take 1 tablet (20 mEq total) by mouth daily.    . predniSONE (DELTASONE) 5 MG tablet Take 2.5 mg by mouth daily with breakfast.    . torsemide (DEMADEX) 10 MG tablet Take 1.5 tablets (15 mg total) by mouth daily. 135 tablet 0   No current facility-administered medications for this visit.   Facility-Administered Medications Ordered in Other Visits  Medication Dose Route Frequency Provider Last Rate Last Dose  . 0.9 %  sodium chloride infusion   Intravenous Continuous Leia Alf, MD 20 mL/hr at 07/04/14 1150    . heparin lock flush 100 unit/mL  500 Units Intravenous Once Leia Alf, MD      . heparin lock flush 100 unit/mL  500 Units Intravenous Once Leia Alf, MD      . sodium chloride 0.9 % injection 10 mL  10 mL Intravenous PRN Leia Alf, MD      . sodium chloride 0.9 % injection 10 mL  10 mL Intravenous PRN Leia Alf, MD   10 mL at 07/04/14 1145    PHYSICAL EXAM: Filed Vitals:   09/17/14 0939  BP: 101/62  Pulse: 83  Temp: 97.8 F (36.6 C)  Resp: 18     Body mass index is 33.34 kg/(m^2).    ECOG FS:2 - Symptomatic, <50% confined to bed  GENERAL: Chronically weak and sitting in wheelchair, otherwise alert and oriented and in no acute distress. No icterus. Pallor +. HEENT: EOMs intact. No cervical lymphadenopathy. CVS: S1S2, regular LUNGS: Bilaterally diminished BS more so on right side, occasional rhonchi ABDOMEN: Soft, nontender.    NEURO: grossly nonfocal, cranial nerves are intact.   EXTREMITIES: No pedal edema.   LAB RESULTS:    Component Value Date/Time   NA 135 09/16/2014 1110   NA 136 08/19/2014 1337   NA 135* 02/08/2014 0844   K 3.5 09/16/2014 1110   K 4.0 02/08/2014 0844   CL 100* 09/16/2014 1110   CL 105 02/08/2014 0844  CO2 27 09/16/2014 1110   CO2 21 02/08/2014 0844   GLUCOSE 230* 09/16/2014 1110   GLUCOSE 136* 08/19/2014 1337   GLUCOSE 157* 02/08/2014 0844   BUN 28* 09/16/2014 1110   BUN 32* 08/19/2014 1337   BUN 15 02/08/2014 0844   CREATININE 1.94* 09/16/2014 1110   CREATININE 1.36* 02/08/2014 0844   CALCIUM 8.2* 09/16/2014 1110   CALCIUM 8.8 02/08/2014 0844   PROT 7.7 09/16/2014 1110   PROT 7.9 12/06/2013 1453   ALBUMIN 2.3* 09/16/2014 1110   ALBUMIN 2.8* 12/06/2013 1453   AST 21 09/16/2014 1110   AST 16 12/06/2013 1453   ALT 9* 09/16/2014 1110   ALT 12* 12/06/2013 1453   ALKPHOS 95 09/16/2014 1110   ALKPHOS 82 12/06/2013 1453   BILITOT 0.9 09/16/2014 1110   BILITOT 0.3 12/06/2013 1453   GFRNONAA 25* 09/16/2014 1110   GFRNONAA 41* 02/08/2014 0844   GFRNONAA 32* 07/30/2013 1508   GFRAA 29* 09/16/2014 1110   GFRAA 49* 02/08/2014 0844   GFRAA 37* 07/30/2013 1508   Lab Results  Component Value Date   WBC 4.8 09/24/2014   NEUTROABS 3.7 09/24/2014   HGB 8.8*  09/24/2014   HCT 25.8* 09/24/2014   MCV 86.8 09/24/2014   PLT 348 09/24/2014  09/15/14 - hemoglobin 6.7, serum iron low at 14, iron saturation low at 6%, ferritin 58, TIBC low at 223, creatinine 1.90, LFT unremarkable except low albumin of 2.4.   STUDIES: 07/02/14 - CT Chest. IMPRESSION: Some interval further volume loss in the right hemi thorax with the increasing collapse/ consolidation anteriorly in the region of apparent fibrosis. This is associated with development of some septal thickening along the inferior right lung and continued close attention recommended as recurrent disease cannot be excluded.  ASSESSMENT / PLAN:   1. 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 Last CT scan on 07/02/2014 reported some further volume loss but otherwise no obvious evidence to suggest progressive lung cancer. Patient had increased cough and chest x-ray done yesterday was suggestive of dense infiltrate on the right side, sputum culture is positive and she has reportedly just started on oral anti-biotic. Will follow-up in 3 weeks. Regarding lung cancer, she had been in hospice in the past but it was discontinued since her condition remained steady over a prolonged period of time. Currently she is still weak with poor performance status, patient and husband also prefer to pursue CT scan evaluation if indicated by symptoms. Will continue port flush q6 weeks. Will see her back in 3 weeks for follow-up. 2. Chronic anemia - Hb is lower at 6.7 g.  Will proceed with transfusion support, also given recurrent iron deficiency will pursue IV Venofer 500 mg and continue on oral iron. Likely has a component of anemia of chronic renal insufficiency. If anemia does not improve with iron therapy, then could consider Procrit therapy. Patient denies any bleeding issues at this time. She does not do much activity otherwise. Will repeat hemoglobin when she comes for window for treatment in one week, next M.D. follow-up in 3  weeks with lab. 3. Respiratory tract infection - patient states that she just started on anti-biotic. Monitor for symptom progression.  4. In between visits, patient advised to call or come to ER in case of any worsening symptoms or acute sickness. She is agreeable to this plan.   Leia Alf, MD   10/05/2014 12:55 PM

## 2014-10-08 ENCOUNTER — Encounter: Payer: Self-pay | Admitting: Internal Medicine

## 2014-10-08 ENCOUNTER — Inpatient Hospital Stay: Payer: Medicare Other | Admitting: Internal Medicine

## 2014-10-08 ENCOUNTER — Other Ambulatory Visit: Payer: Self-pay

## 2014-10-08 ENCOUNTER — Inpatient Hospital Stay: Payer: Medicare Other

## 2014-10-08 ENCOUNTER — Other Ambulatory Visit: Payer: Medicare Other

## 2014-10-08 ENCOUNTER — Inpatient Hospital Stay: Payer: Medicare Other | Attending: Internal Medicine

## 2014-10-08 ENCOUNTER — Inpatient Hospital Stay
Admission: AD | Admit: 2014-10-08 | Discharge: 2014-10-12 | DRG: 812 | Disposition: A | Discharge status: Home / Self Care | Payer: Medicare Other | Source: Ambulatory Visit | Attending: Internal Medicine | Admitting: Internal Medicine

## 2014-10-08 ENCOUNTER — Encounter: Payer: Self-pay | Admitting: *Deleted

## 2014-10-08 ENCOUNTER — Ambulatory Visit: Payer: Medicare Other | Admitting: Internal Medicine

## 2014-10-08 ENCOUNTER — Other Ambulatory Visit: Payer: Self-pay | Admitting: *Deleted

## 2014-10-08 VITALS — BP 104/52 | HR 74 | Temp 96.4°F | Resp 18 | Ht <= 58 in | Wt 160.1 lb

## 2014-10-08 DIAGNOSIS — I4891 Unspecified atrial fibrillation: Secondary | ICD-10-CM | POA: Insufficient documentation

## 2014-10-08 DIAGNOSIS — Z820 Family history of epilepsy and other diseases of the nervous system: Secondary | ICD-10-CM

## 2014-10-08 DIAGNOSIS — C3411 Malignant neoplasm of upper lobe, right bronchus or lung: Secondary | ICD-10-CM | POA: Insufficient documentation

## 2014-10-08 DIAGNOSIS — D509 Iron deficiency anemia, unspecified: Secondary | ICD-10-CM

## 2014-10-08 DIAGNOSIS — K5520 Angiodysplasia of colon without hemorrhage: Secondary | ICD-10-CM | POA: Diagnosis present

## 2014-10-08 DIAGNOSIS — R5383 Other fatigue: Secondary | ICD-10-CM | POA: Diagnosis not present

## 2014-10-08 DIAGNOSIS — D500 Iron deficiency anemia secondary to blood loss (chronic): Principal | ICD-10-CM | POA: Diagnosis present

## 2014-10-08 DIAGNOSIS — Z79899 Other long term (current) drug therapy: Secondary | ICD-10-CM | POA: Insufficient documentation

## 2014-10-08 DIAGNOSIS — K5790 Diverticulosis of intestine, part unspecified, without perforation or abscess without bleeding: Secondary | ICD-10-CM | POA: Diagnosis present

## 2014-10-08 DIAGNOSIS — J209 Acute bronchitis, unspecified: Secondary | ICD-10-CM

## 2014-10-08 DIAGNOSIS — Z9221 Personal history of antineoplastic chemotherapy: Secondary | ICD-10-CM

## 2014-10-08 DIAGNOSIS — I10 Essential (primary) hypertension: Secondary | ICD-10-CM | POA: Insufficient documentation

## 2014-10-08 DIAGNOSIS — K921 Melena: Secondary | ICD-10-CM | POA: Diagnosis present

## 2014-10-08 DIAGNOSIS — D649 Anemia, unspecified: Secondary | ICD-10-CM | POA: Insufficient documentation

## 2014-10-08 DIAGNOSIS — Z87891 Personal history of nicotine dependence: Secondary | ICD-10-CM

## 2014-10-08 DIAGNOSIS — R0600 Dyspnea, unspecified: Secondary | ICD-10-CM

## 2014-10-08 DIAGNOSIS — Z8711 Personal history of peptic ulcer disease: Secondary | ICD-10-CM

## 2014-10-08 DIAGNOSIS — Z923 Personal history of irradiation: Secondary | ICD-10-CM

## 2014-10-08 DIAGNOSIS — K219 Gastro-esophageal reflux disease without esophagitis: Secondary | ICD-10-CM | POA: Diagnosis present

## 2014-10-08 DIAGNOSIS — E039 Hypothyroidism, unspecified: Secondary | ICD-10-CM | POA: Diagnosis not present

## 2014-10-08 DIAGNOSIS — J449 Chronic obstructive pulmonary disease, unspecified: Secondary | ICD-10-CM | POA: Diagnosis present

## 2014-10-08 DIAGNOSIS — E876 Hypokalemia: Secondary | ICD-10-CM | POA: Diagnosis present

## 2014-10-08 DIAGNOSIS — R710 Precipitous drop in hematocrit: Secondary | ICD-10-CM | POA: Diagnosis present

## 2014-10-08 DIAGNOSIS — E038 Other specified hypothyroidism: Secondary | ICD-10-CM

## 2014-10-08 DIAGNOSIS — K640 First degree hemorrhoids: Secondary | ICD-10-CM | POA: Diagnosis present

## 2014-10-08 DIAGNOSIS — C3490 Malignant neoplasm of unspecified part of unspecified bronchus or lung: Secondary | ICD-10-CM | POA: Diagnosis present

## 2014-10-08 LAB — SAMPLE TO BLOOD BANK

## 2014-10-08 LAB — HEMOGLOBIN: HEMOGLOBIN: 5.1 g/dL — AB (ref 12.0–16.0)

## 2014-10-08 LAB — PREPARE RBC (CROSSMATCH)

## 2014-10-08 MED ORDER — HEPARIN SOD (PORK) LOCK FLUSH 100 UNIT/ML IV SOLN: 250.0000 [IU] | INTRAVENOUS | Status: DC | PRN

## 2014-10-08 MED ORDER — PANTOPRAZOLE SODIUM 40 MG PO TBEC
40.0000 mg | DELAYED_RELEASE_TABLET | Freq: Two times a day (BID) | ORAL | Status: DC
  Administered 2014-10-09 – 2014-10-12 (×6): 40 mg via ORAL
  Filled 2014-10-08 (×7): qty 1

## 2014-10-08 MED ORDER — GABAPENTIN 100 MG PO CAPS
100.0000 mg | ORAL_CAPSULE | Freq: Two times a day (BID) | ORAL | Status: DC
  Administered 2014-10-09 – 2014-10-12 (×6): 100 mg via ORAL
  Filled 2014-10-08 (×7): qty 1

## 2014-10-08 MED ORDER — CARVEDILOL 6.25 MG PO TABS
12.5000 mg | ORAL_TABLET | Freq: Every day | ORAL | Status: DC
  Filled 2014-10-08: qty 2

## 2014-10-08 MED ORDER — SODIUM CHLORIDE 0.9 % IV SOLN: 250.0000 mL | Freq: Once | INTRAVENOUS | Status: DC

## 2014-10-08 MED ORDER — SODIUM CHLORIDE 0.9 % IJ SOLN: 3.0000 mL | INTRAMUSCULAR | Status: DC | PRN

## 2014-10-08 MED ORDER — ISOSORBIDE MONONITRATE ER 30 MG PO TB24
30.0000 mg | ORAL_TABLET | Freq: Every day | ORAL | Status: DC
  Administered 2014-10-09 – 2014-10-12 (×3): 30 mg via ORAL
  Filled 2014-10-08 (×3): qty 1

## 2014-10-08 MED ORDER — PREDNISONE 5 MG PO TABS
2.5000 mg | ORAL_TABLET | Freq: Every day | ORAL | Status: DC
  Filled 2014-10-08: qty 1

## 2014-10-08 MED ORDER — SODIUM CHLORIDE 0.9 % IJ SOLN
3.0000 mL | Freq: Two times a day (BID) | INTRAMUSCULAR | Status: DC
  Administered 2014-10-09 – 2014-10-12 (×3): 3 mL via INTRAVENOUS

## 2014-10-08 MED ORDER — ATORVASTATIN CALCIUM 20 MG PO TABS
20.0000 mg | ORAL_TABLET | Freq: Every day | ORAL | Status: DC
  Administered 2014-10-08 – 2014-10-11 (×5): 20 mg via ORAL
  Filled 2014-10-08 (×5): qty 1

## 2014-10-08 MED ORDER — LIOTHYRONINE SODIUM 25 MCG PO TABS: 25.0000 ug | ORAL_TABLET | Freq: Every day | ORAL | Status: DC

## 2014-10-08 MED ORDER — ACETAMINOPHEN 325 MG PO TABS
650.0000 mg | ORAL_TABLET | Freq: Once | ORAL | Status: DC
  Filled 2014-10-08: qty 2

## 2014-10-08 MED ORDER — DIPHENHYDRAMINE HCL 25 MG PO CAPS
25.0000 mg | ORAL_CAPSULE | Freq: Once | ORAL | Status: DC
  Filled 2014-10-08: qty 1

## 2014-10-08 MED ORDER — AMIODARONE HCL 200 MG PO TABS
200.0000 mg | ORAL_TABLET | Freq: Every day | ORAL | Status: DC
  Administered 2014-10-09 – 2014-10-12 (×3): 200 mg via ORAL
  Filled 2014-10-08 (×3): qty 1

## 2014-10-08 MED ORDER — GUAIFENESIN 100 MG/5ML PO SYRP
100.0000 mg | ORAL_SOLUTION | Freq: Four times a day (QID) | ORAL | Status: DC | PRN
  Filled 2014-10-08: qty 5

## 2014-10-08 MED ORDER — BENZONATATE 100 MG PO CAPS: 100.0000 mg | ORAL_CAPSULE | Freq: Three times a day (TID) | ORAL | Status: DC | PRN

## 2014-10-08 MED ORDER — ALBUTEROL SULFATE (2.5 MG/3ML) 0.083% IN NEBU: 2.5000 mg | INHALATION_SOLUTION | Freq: Four times a day (QID) | RESPIRATORY_TRACT | Status: DC | PRN

## 2014-10-08 MED ORDER — LIOTHYRONINE SODIUM 25 MCG PO TABS
25.0000 ug | ORAL_TABLET | Freq: Every day | ORAL | Status: DC
  Administered 2014-10-09 – 2014-10-12 (×3): 25 ug via ORAL
  Filled 2014-10-08 (×4): qty 1

## 2014-10-08 MED ORDER — HEPARIN SOD (PORK) LOCK FLUSH 100 UNIT/ML IV SOLN: 500.0000 [IU] | Freq: Every day | INTRAVENOUS | Status: DC | PRN

## 2014-10-08 MED ORDER — FOLIC ACID 1 MG PO TABS
1.0000 mg | ORAL_TABLET | Freq: Every day | ORAL | Status: DC
  Administered 2014-10-09 – 2014-10-12 (×3): 1 mg via ORAL
  Filled 2014-10-08 (×3): qty 1

## 2014-10-08 MED ORDER — TORSEMIDE 5 MG PO TABS
15.0000 mg | ORAL_TABLET | Freq: Every day | ORAL | Status: DC
  Administered 2014-10-09 – 2014-10-12 (×3): 15 mg via ORAL
  Filled 2014-10-08 (×3): qty 3

## 2014-10-08 MED ORDER — SODIUM CHLORIDE 0.9 % IV SOLN: 250.0000 mL | INTRAVENOUS | Status: DC | PRN

## 2014-10-08 MED ORDER — SODIUM CHLORIDE 0.9 % IJ SOLN
10.0000 mL | INTRAMUSCULAR | Status: AC | PRN
  Administered 2014-10-09: 10 mL

## 2014-10-08 NOTE — H&P (Signed)
HISTORY and PHYSICAL NOTE -  Chief Complaint/Reason for Admission -  severe progressive symptomatic anemia, hemoglobin 5.1, observation admit for facilitating stat transfusion.  HISTORY OF PRESENT ILLNESS:  Patient was seen at [facility] today for close follow-up of recent recurrent anemia, she received blood transfusion and IV iron about 2-3 weeks ago. Hemoglobin today has dropped down further to 5.1 from 8. Patient is feeling severe fatigue and weakness. She already has baseline dyspnea on exertion, cough from lung cancer and COPD. She denied any angina or palpitations. She has some lightheadedness when getting up and ambulating. No fever or chills. Memory is chronically poor, patient accompanied by husband today. States that she has intermittent dark stools, otherwise denies bright red blood in stools or clots. Denies hematuria or other bleeding issues. She is being planned for endoscopy evaluation by GI in the next few weeks.   REVIEW OF SYSTEMS:  ROS As in HPI above. In addition, no fever or chills.  Denies new headaches or seizures.  No sore throat or dysphagia.  No chest pain or palpitation.  No abdominal pain, constipation, diarrhea, dysuria or hematuria.  No new skin rash. Denies new bone pains.  Denies loss of consciousness or seizures.  No polyuria or polydipsia.  PS ECOG 2.  PAST MEDICAL HISTORY:   COPD, ex-smoker  Hypertension Iron deficiency anemia Diverticulosis GERD, history of gastric ulcers EGD and colonoscopy 2010 Bronchoscopy January 2013 showed non-small cell carcinoma Gallstone pancreatitis (acute) April 2013, status post procedure at [facility]  PAST SURGICAL HISTORY:  As above  FAMILY HISTORY:  Her mother died of cancer of unknown primary, uncle had lung cancer. Otherwise noncontributory.  SOCIAL HISTORY:  Social History  Substance Use Topics  . Smoking status: Former Research scientist (life sciences)  .  Smokeless tobacco: Never Used  . Alcohol Use: No    No Known Allergies  Current Outpatient Prescriptions  Medication Sig    . albuterol (PROVENTIL HFA;VENTOLIN HFA) 108 (90 BASE) MCG/ACT inhaler Inhale 2 puffs into the lungs every 6 (six) hours as needed for wheezing or shortness of breath.    Marland Kitchen albuterol (PROVENTIL) (2.5 MG/3ML) 0.083% nebulizer solution Take 2.5 mg by nebulization every 4 (four) hours as needed for wheezing or shortness of breath.    Marland Kitchen amiodarone (PACERONE) 200 MG tablet Take 200 mg by mouth daily.    Marland Kitchen atorvastatin (LIPITOR) 20 MG tablet Take 20 mg by mouth at bedtime.     . benzonatate (TESSALON) 100 MG capsule Take 100 mg by mouth 3 (three) times daily as needed for cough.    . carvedilol (COREG) 12.5 MG tablet Take 12.5 mg by mouth daily. 1/2 pill a day    . chlorpheniramine (CHLOR-TRIMETON) 4 MG tablet Take 4 mg by mouth 3 (three) times daily.    . ferrous sulfate 325 (65 FE) MG tablet Take 325 mg by mouth 3 (three) times daily.    . folic acid (FOLVITE) 1 MG tablet Take 1 mg by mouth daily.    Marland Kitchen gabapentin (NEURONTIN) 100 MG capsule Take 1 capsule (100 mg total) by mouth 2 (two) times daily.    Marland Kitchen guaifenesin (ROBITUSSIN) 100 MG/5ML syrup Take 100 mg by mouth every 6 (six) hours as needed for cough.    . isosorbide mononitrate (IMDUR) 30 MG 24 hr tablet Take 30 mg by mouth daily.    Marland Kitchen liothyronine (CYTOMEL) 25 MCG tablet Take 1 tablet (25 mcg total) by mouth daily.    . pantoprazole (PROTONIX) 40 MG tablet Take 40  mg by mouth 2 (two) times daily.     . potassium chloride SA (K-DUR,KLOR-CON) 20 MEQ tablet Take 1 tablet (20 mEq total) by mouth daily.    . predniSONE (DELTASONE) 5 MG tablet Take 2.5 mg by mouth daily with breakfast.    . torsemide (DEMADEX) 10 MG tablet Take 1.5 tablets (15 mg total) by mouth daily.      PHYSICAL EXAM:  VITALS: 96.4, 74, 18, 104/52, 160 pounds GENERAL:  Chronically weak and sitting in wheelchair, otherwise alert and oriented and in no acute distress. No icterus. Pallor ++. HEENT: EOMs intact. No cervical lymphadenopathy. CVS: S1S2, regular LUNGS: Bilaterally diminished BS more so on right side, occasional rhonchi ABDOMEN: Soft, nontender.  NEURO: Moves all extremities spontaneously, cranial nerves are intact. Gait not checked.  EXTREMITIES: No pedal edema. SKIN: No generalized rashes or major bruising MUSCULOSKELETAL: No obvious joint redness or swelling   LAB RESULTS: Hemoglobin 5.1 today.          Recent Labs    Lab Results  Component Value Date   WBC 4.8 09/24/2014   NEUTROABS 3.7 09/24/2014   HGB 8.8* 09/24/2014   HCT 25.8* 09/24/2014   MCV 86.8 09/24/2014   PLT 348 09/24/2014    09/15/14 - hemoglobin 6.7, serum iron low at 14, iron saturation low at 6%, ferritin 58, TIBC low at 223, creatinine 1.90, LFT unremarkable except low albumin of 2.4.   STUDIES: 07/02/14 - CT Chest. IMPRESSION: Some interval further volume loss in the right hemi thorax with the increasing collapse/ consolidation anteriorly in the region of apparent fibrosis. This is associated with development of some septal thickening along the inferior right lung and continued close attention recommended as recurrent disease cannot be excluded.  ASSESSMENT / PLAN:  1. Acute on Chronic anemia - hemoglobin today has dropped down to 5.1 from 8.8 on September 21, despite transfusion support and IV iron therapy. Patient has intermittent dark stools, likely has ongoing GI blood loss. Given her severe symptoms and severe anemia, we will make observation admit to facilitate stat blood transfusion. Will give 2 units packed red blood cells over 3 hours each, premedicate with Tylenol and Benadryl. Will repeat CBC tomorrow. Will contact GI tomorrow regarding their opinion to see if they would need to see her during hospitalization and plan urgent  endoscopic evaluation. Patient and husband present were explained above details, and also discussed risks and benefits of blood transfusion and she is agreeable to this.  2. 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 Chronic cough is at baseline. No fevers. No pain issues at this time.  3. COPD, hypertension - continue home medications same as before. Will hold oral iron at this time in case she needs endoscopy. Patient and husband explained above details, they are agreeable to this plan.

## 2014-10-08 NOTE — Telephone Encounter (Signed)
Routing to provider  

## 2014-10-08 NOTE — Progress Notes (Signed)
Energy better since last visit. When she does walk around and get tired she sits down and gets her oxygen. She is set up for gi 11/11 and she has done one stool card and waiting on the other one.

## 2014-10-09 ENCOUNTER — Inpatient Hospital Stay: Payer: Medicare Other

## 2014-10-09 DIAGNOSIS — E876 Hypokalemia: Secondary | ICD-10-CM | POA: Diagnosis present

## 2014-10-09 DIAGNOSIS — C3411 Malignant neoplasm of upper lobe, right bronchus or lung: Secondary | ICD-10-CM | POA: Diagnosis present

## 2014-10-09 DIAGNOSIS — Z8711 Personal history of peptic ulcer disease: Secondary | ICD-10-CM | POA: Diagnosis not present

## 2014-10-09 DIAGNOSIS — D649 Anemia, unspecified: Secondary | ICD-10-CM | POA: Diagnosis not present

## 2014-10-09 DIAGNOSIS — K640 First degree hemorrhoids: Secondary | ICD-10-CM | POA: Diagnosis present

## 2014-10-09 DIAGNOSIS — K921 Melena: Secondary | ICD-10-CM | POA: Diagnosis present

## 2014-10-09 DIAGNOSIS — K5520 Angiodysplasia of colon without hemorrhage: Secondary | ICD-10-CM | POA: Diagnosis present

## 2014-10-09 DIAGNOSIS — I10 Essential (primary) hypertension: Secondary | ICD-10-CM | POA: Diagnosis present

## 2014-10-09 DIAGNOSIS — K219 Gastro-esophageal reflux disease without esophagitis: Secondary | ICD-10-CM | POA: Diagnosis present

## 2014-10-09 DIAGNOSIS — Z820 Family history of epilepsy and other diseases of the nervous system: Secondary | ICD-10-CM | POA: Diagnosis not present

## 2014-10-09 DIAGNOSIS — E039 Hypothyroidism, unspecified: Secondary | ICD-10-CM | POA: Diagnosis present

## 2014-10-09 DIAGNOSIS — D500 Iron deficiency anemia secondary to blood loss (chronic): Secondary | ICD-10-CM | POA: Diagnosis present

## 2014-10-09 DIAGNOSIS — Z9221 Personal history of antineoplastic chemotherapy: Secondary | ICD-10-CM | POA: Diagnosis not present

## 2014-10-09 DIAGNOSIS — K5790 Diverticulosis of intestine, part unspecified, without perforation or abscess without bleeding: Secondary | ICD-10-CM | POA: Diagnosis present

## 2014-10-09 DIAGNOSIS — Z923 Personal history of irradiation: Secondary | ICD-10-CM | POA: Diagnosis not present

## 2014-10-09 DIAGNOSIS — R710 Precipitous drop in hematocrit: Secondary | ICD-10-CM | POA: Diagnosis present

## 2014-10-09 DIAGNOSIS — J449 Chronic obstructive pulmonary disease, unspecified: Secondary | ICD-10-CM | POA: Diagnosis present

## 2014-10-09 DIAGNOSIS — R5383 Other fatigue: Secondary | ICD-10-CM | POA: Diagnosis not present

## 2014-10-09 DIAGNOSIS — I4891 Unspecified atrial fibrillation: Secondary | ICD-10-CM | POA: Diagnosis present

## 2014-10-09 DIAGNOSIS — Z87891 Personal history of nicotine dependence: Secondary | ICD-10-CM | POA: Diagnosis not present

## 2014-10-09 LAB — CBC
HCT: 22.2 % — ABNORMAL LOW (ref 35.0–47.0)
Hemoglobin: 7.4 g/dL — ABNORMAL LOW (ref 12.0–16.0)
MCH: 29.6 pg (ref 26.0–34.0)
MCHC: 33.4 g/dL (ref 32.0–36.0)
MCV: 88.7 fL (ref 80.0–100.0)
PLATELETS: 187 10*3/uL (ref 150–440)
RBC: 2.5 MIL/uL — AB (ref 3.80–5.20)
RDW: 18 % — ABNORMAL HIGH (ref 11.5–14.5)
WBC: 6.9 10*3/uL (ref 3.6–11.0)

## 2014-10-09 LAB — PREPARE RBC (CROSSMATCH)

## 2014-10-09 MED ORDER — HEPARIN SOD (PORK) LOCK FLUSH 100 UNIT/ML IV SOLN: 500.0000 [IU] | Freq: Every day | INTRAVENOUS | Status: DC | PRN

## 2014-10-09 MED ORDER — SODIUM CHLORIDE 0.9 % IV SOLN
250.0000 mL | Freq: Once | INTRAVENOUS | Status: AC
  Administered 2014-10-09: 12:00:00 250 mL via INTRAVENOUS

## 2014-10-09 MED ORDER — SODIUM CHLORIDE 0.9 % IV SOLN
Freq: Once | INTRAVENOUS | Status: AC
  Administered 2014-10-09: 16:00:00 via INTRAVENOUS

## 2014-10-09 MED ORDER — HEPARIN SOD (PORK) LOCK FLUSH 100 UNIT/ML IV SOLN: 250.0000 [IU] | INTRAVENOUS | Status: DC | PRN

## 2014-10-09 MED ORDER — DIPHENHYDRAMINE HCL 25 MG PO CAPS
25.0000 mg | ORAL_CAPSULE | Freq: Once | ORAL | Status: AC
  Administered 2014-10-09: 12:00:00 25 mg via ORAL

## 2014-10-09 MED ORDER — ACETAMINOPHEN 325 MG PO TABS
650.0000 mg | ORAL_TABLET | Freq: Once | ORAL | Status: AC
  Administered 2014-10-09: 12:00:00 650 mg via ORAL

## 2014-10-09 MED ORDER — CARVEDILOL 3.125 MG PO TABS
3.1250 mg | ORAL_TABLET | Freq: Two times a day (BID) | ORAL | Status: DC
  Administered 2014-10-09 – 2014-10-12 (×6): 3.125 mg via ORAL
  Filled 2014-10-09 (×6): qty 1

## 2014-10-09 MED ORDER — SODIUM CHLORIDE 0.9 % IJ SOLN
10.0000 mL | INTRAMUSCULAR | Status: AC | PRN
  Administered 2014-10-09: 10 mL

## 2014-10-09 MED ORDER — SODIUM CHLORIDE 0.9 % IJ SOLN: 3.0000 mL | INTRAMUSCULAR | Status: DC | PRN

## 2014-10-09 NOTE — Plan of Care (Signed)
Problem: Discharge Progression Outcomes Goal: Other Discharge Outcomes/Goals Outcome: Progressing Plan of care progress to goal 1. Pain control: No complaints of pain 2. Hemodynamically stable: Hgb 5.1 on admission. Pt received two units of prbc's. Tolerated transfusion without any difficulties. Repeat labs pending for this am. 3. Complications resolved: Depending on am labs. 2 units ready in the blood bank. 4. Tolerating diet: Pt with no complaints of nausea or vomiting.  5. Activity: Pt up to BR with standby assist. Ambulating without difficulty. 6. Other discharge outcomes: Pt admitted as observation. Most likely discharge today. Pt has GI procedures pending in the next two weeks.

## 2014-10-09 NOTE — Plan of Care (Signed)
Problem: Discharge Progression Outcomes Goal: Other Discharge Outcomes/Goals Outcome: Progressing VSS. Hgb in am 7.4. 2xunits of blood ordered to transfused. 2nd unit of blood still transfusing. No transfusion reaction occurred. CBC pending in am. Tolerates diet. Pt had soft brown stool today. No bleeding noted. No c/o n/v. Denies pain. EGD tomorrow.

## 2014-10-09 NOTE — Consult Note (Signed)
GI Inpatient Consult Note  Reason for Consult:   Attending Requesting Consult:  History of Present Illness: Katrina Kramer is a 74 y.o. femalewith history of dark stools with rapid loss of blood after transfusions.  Hgb dropped from 8 to 5 in few weeks.  She was admitted for transfusions and I was asked to see her for GI evaluation for possible blood loss from GI tract.  Past Medical History:  Past Medical History  Diagnosis Date  . Atrial fibrillation ([place])     Dr. Humphrey Kramer, cardiologist  . Anemia   . Hypothyroidism   . Lung cancer ([place])   . Hypertension   . Lung cancer ([place])   . Anemia     Problem List: Patient Active Problem List   Diagnosis Date Noted  . Cellulitis of multiple sites of hand and fingers 09/16/2014  . Abscess of multiple sites of hand and fingers 09/16/2014  . Drug-induced hypokalemia 08/31/2014  . Acid reflux 08/31/2014  . Irritant hand dermatitis 08/31/2014  . Atrial fibrillation ([place])   . Anemia   . Hypothyroidism   . Lung cancer ([place])   . Hypertension   . IDA (iron deficiency anemia) 07/04/2014  . H/O upper gastrointestinal hemorrhage 03/26/2014  . CAFL (chronic airflow limitation) ([place]) 09/24/2013    Past Surgical History: Past Surgical History  Procedure Laterality Date  . Upper gastrointestinal endoscopy  02/24/14    Allergies: No Known Allergies  Home Medications: Prescriptions prior to admission  Medication Sig Dispense Refill Last Dose  . albuterol (PROVENTIL HFA;VENTOLIN HFA) 108 (90 BASE) MCG/ACT inhaler Inhale 2 puffs into the lungs every 6 (six) hours as needed for wheezing or shortness of breath.   Taking  . albuterol (PROVENTIL) (2.5 MG/3ML) 0.083% nebulizer solution Take 2.5 mg by nebulization every 4 (four) hours as needed for wheezing or shortness of breath.   Taking  . amiodarone (PACERONE) 200 MG tablet Take 200 mg by mouth daily.   Taking  . atorvastatin (LIPITOR) 20 MG tablet Take 20 mg by mouth at bedtime.    Taking  .  benzonatate (TESSALON) 100 MG capsule Take 100 mg by mouth 3 (three) times daily as needed for cough.   Taking  . carvedilol (COREG) 3.125 MG tablet Take 3.125 mg by mouth 2 (two) times daily with a meal.     . gabapentin (NEURONTIN) 100 MG capsule Take 1 capsule (100 mg total) by mouth 2 (two) times daily. 60 capsule 6 Taking  . guaifenesin (ROBITUSSIN) 100 MG/5ML syrup Take 100 mg by mouth every 6 (six) hours as needed for cough.   Taking  . isosorbide mononitrate (IMDUR) 30 MG 24 hr tablet Take 30 mg by mouth daily.   Taking  . pantoprazole (PROTONIX) 40 MG tablet Take 40 mg by mouth 2 (two) times daily.    Taking  . potassium chloride SA (K-DUR,KLOR-CON) 20 MEQ tablet Take 1 tablet (20 mEq total) by mouth daily.   Taking  . torsemide (DEMADEX) 10 MG tablet Take 1.5 tablets (15 mg total) by mouth daily. 135 tablet 0 Taking   Home medication reconciliation was completed with the patient.   Scheduled Inpatient Medications:   . sodium chloride  250 mL Intravenous Once  . acetaminophen  650 mg Oral Once  . amiodarone  200 mg Oral Daily  . atorvastatin  20 mg Oral QHS  . carvedilol  3.125 mg Oral BID WC  . diphenhydrAMINE  25 mg Oral Once  . folic acid  1 mg  Oral Daily  . gabapentin  100 mg Oral BID  . isosorbide mononitrate  30 mg Oral Daily  . liothyronine  25 mcg Oral Daily  . pantoprazole  40 mg Oral BID  . sodium chloride  3 mL Intravenous Q12H  . torsemide  15 mg Oral Daily    Continuous Inpatient Infusions:     PRN Inpatient Medications:  sodium chloride, albuterol, benzonatate, guaifenesin, heparin lock flush, heparin lock flush, heparin lock flush, heparin lock flush, heparin lock flush, sodium chloride, sodium chloride, sodium chloride, sodium chloride, sodium chloride  Family History: family history includes Alzheimer's disease in her father; Cancer in her mother; Varicose Veins in her daughter.  The patient's family history is negative for inflammatory bowel disorders, GI  malignancy, or solid organ transplantation.  Social History:   reports that she has quit smoking. She has never used smokeless tobacco. She reports that she does not drink alcohol or use illicit drugs. The patient denies ETOH, tobacco, or drug use.   Review of Systems: Constitutional: Weight is stable.  Eyes: No changes in vision. ENT: No oral lesions, sore throat.  GI: see HPI. No abd pain, no BRBPR, no blood on coughing or spitting up mucus.  She may have had an EGD or colonoscopy she is not sure.   Heme/Lymph: No easy bruising.  CV: No chest pain.  GU: No hematuria.  Integumentary: No rashes. Bandage on left hand. Neuro: No headaches.  Psych: No depression/anxiety.  Endocrine: No heat/cold intolerance.  Allergic/Immunologic: No urticaria.  Resp: No cough, SOB. Had R lung cancer diagnosed 4 years ago and has gotten chemo and radiation, on chemo but no more radiation. Musculoskeletal: No joint swelling.    Physical Examination: BP 111/43 mmHg  Pulse 92  Temp(Src) 98.1 F (36.7 C) (Oral)  Resp 20  Ht '4\' 8"'$  (1.422 m)  Wt 76.25 kg (168 lb 1.6 oz)  BMI 37.71 kg/m2  SpO2 96% Gen: NAD, alert and oriented x 4 HEENT: PEERLA, EOMI, conjunctiva pale, tongue also. Neck: supple, no JVD or thyromegaly Chest: Very poor breath sounds on whole right side of lung, good air flow on left side. CV: RRR, no m/g/c/r 1/6 SEM, loud S2. Abd: soft, NT, ND, +BS in all four quadrants; no HSM, guarding, ridigity, or rebound tenderness Ext: no edema, well perfused with 2+ pulses, Skin: no rash or lesions noted Lymph: no LAD  Data: Lab Results  Component Value Date   WBC 6.9 10/09/2014   HGB 7.4* 10/09/2014   HCT 22.2* 10/09/2014   MCV 88.7 10/09/2014   PLT 187 10/09/2014    Recent Labs Lab 10/08/14 1455 10/09/14 0517  HGB 5.1* 7.4*   Lab Results  Component Value Date   NA 135 09/16/2014   K 3.5 09/16/2014   CL 100* 09/16/2014   CO2 27 09/16/2014   BUN 28* 09/16/2014   CREATININE  1.94* 09/16/2014   Lab Results  Component Value Date   ALT 9* 09/16/2014   AST 21 09/16/2014   ALKPHOS 95 09/16/2014   BILITOT 0.9 09/16/2014   No results for input(s): APTT, INR, PTT in the last 168 hours. Assessment/Plan: Ms. Katrina Kramer is a 74 y.o. female with hx of lung cancer treated with radiation and chemo with signif blood loss anemia likely from GI tract.  Oncology has requested consult and possible endoscopic evaluation.  I am concerned about her lung exam on right and possible effect on attempts to sedate her for EGD and possible colonoscopy.  Will repeat  CXR today and discuss with Dr. Ma Hillock.  Recommendations:CXR today, possible EGD tomorrow after confer with anesthesiology.  Thank you for the consult. Please call with questions or concerns.  Gaylyn Cheers, MD

## 2014-10-09 NOTE — Progress Notes (Signed)
ONCOLOGY PROGRESS NOTE -   HISTORY OF PRESENT ILLNESS:  Patient continues to feel extremely weak and tired, resting in bed. Chronic cough is at baseline. No new shortness of breath at rest. No chest pain dizziness or palpitation. She received 2 units of packed red blood cells overnight and hemoglobin today is slightly better at 7.4. Being evaluated by GI. No fever or chills. States that she does want to try and go home as soon as possible but understands that she is weak and sick and needs to be in the hospital at this time.    REVIEW OF SYSTEMS:  As in HPI above. In addition, no fever or chills. Denies new headaches or seizures. No sore throat or dysphagia. No abdominal pain, constipation, diarrhea, dysuria or hematuria. No new skin rash. Denies new bone pains. Denies loss of consciousness or seizures. No polyuria or polydipsia.   PAST MEDICAL HISTORY:   COPD, ex-smoker  Hypertension Iron deficiency anemia Diverticulosis GERD, history of gastric ulcers EGD and colonoscopy 2010 Bronchoscopy January 2013 showed non-small cell carcinoma Gallstone pancreatitis (acute) April 2013, status post procedure at [facility]  PAST SURGICAL HISTORY:  As above  FAMILY HISTORY:  Her mother died of cancer of unknown primary, uncle had lung cancer. Otherwise noncontributory.  SOCIAL HISTORY:  Social History  Substance Use Topics  . Smoking status: Former Research scientist (life sciences)  . Smokeless tobacco: Never Used  . Alcohol Use: No    No Known Allergies  Current Outpatient Prescriptions  Medication Sig    . albuterol (PROVENTIL HFA;VENTOLIN HFA) 108 (90 BASE) MCG/ACT inhaler Inhale 2 puffs into the lungs every 6 (six) hours as needed for wheezing or shortness of breath.    Marland Kitchen albuterol (PROVENTIL) (2.5 MG/3ML) 0.083% nebulizer solution Take 2.5 mg by nebulization every 4 (four) hours as needed for  wheezing or shortness of breath.    Marland Kitchen amiodarone (PACERONE) 200 MG tablet Take 200 mg by mouth daily.    Marland Kitchen atorvastatin (LIPITOR) 20 MG tablet Take 20 mg by mouth at bedtime.     . benzonatate (TESSALON) 100 MG capsule Take 100 mg by mouth 3 (three) times daily as needed for cough.    . carvedilol (COREG) 12.5 MG tablet Take 12.5 mg by mouth daily. 1/2 pill a day    . chlorpheniramine (CHLOR-TRIMETON) 4 MG tablet Take 4 mg by mouth 3 (three) times daily.    . ferrous sulfate 325 (65 FE) MG tablet Take 325 mg by mouth 3 (three) times daily.    . folic acid (FOLVITE) 1 MG tablet Take 1 mg by mouth daily.    Marland Kitchen gabapentin (NEURONTIN) 100 MG capsule Take 1 capsule (100 mg total) by mouth 2 (two) times daily.    Marland Kitchen guaifenesin (ROBITUSSIN) 100 MG/5ML syrup Take 100 mg by mouth every 6 (six) hours as needed for cough.    . isosorbide mononitrate (IMDUR) 30 MG 24 hr tablet Take 30 mg by mouth daily.    Marland Kitchen liothyronine (CYTOMEL) 25 MCG tablet Take 1 tablet (25 mcg total) by mouth daily.    . pantoprazole (PROTONIX) 40 MG tablet Take 40 mg by mouth 2 (two) times daily.     . potassium chloride SA (K-DUR,KLOR-CON) 20 MEQ tablet Take 1 tablet (20 mEq total) by mouth daily.    . predniSONE (DELTASONE) 5 MG tablet Take 2.5 mg by mouth daily with breakfast.    . torsemide (DEMADEX) 10 MG tablet Take 1.5 tablets (15 mg total) by mouth daily.  PHYSICAL EXAM: VITALS: 98.1, 92, 20, 111/43, 96% GENERAL: Patient is weak, resting in bed, otherwise alert and oriented and in no acute distress. No icterus. Pallor ++. HEENT: EOMs intact. No oral thrush. CVS: S1S2, regular LUNGS: Bilaterally diminished BS more so on right side, occasional rhonchi ABDOMEN: Soft, nontender.  NEURO: Moves all extremities spontaneously, cranial nerves are intact.   EXTREMITIES: No pedal  edema.  LAB RESULTS: WBC 6.9, platelets 187, hemoglobin 7.4, hematocrit 22.2. Hemoglobin 5.1 on 10/08/14.  STUDIES: 07/02/14 - CT Chest. IMPRESSION: Some interval further volume loss in the right hemi thorax with the increasing collapse/ consolidation anteriorly in the region of apparent fibrosis. This is associated with development of some septal thickening along the inferior right lung and continued close attention recommended as recurrent disease cannot be excluded.  ASSESSMENT / PLAN:  1. Acute on Chronic anemia - patient still extremely weak and tired. Hemoglobin is slightly better at 7.4 g. We will continue transfusion support, 2 units packed red blood cells today and premedicate with Tylenol and Benadryl. Patient is agreeable to this. She is being evaluated by GI Dr. Vira Agar for possible upper endoscopy tomorrow. Given this, will change to regular inpatient admission.  2. 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 Chronic cough is at baseline. No fevers. No pain issues at this time.  3. COPD, hypertension - continue home medications same as before. Will hold oral iron at this time in case she needs endoscopy. 4. Pain - no issues at this time. Patient explained above details, she is agreeable to this plan.

## 2014-10-10 ENCOUNTER — Encounter: Payer: Self-pay | Admitting: Anesthesiology

## 2014-10-10 ENCOUNTER — Inpatient Hospital Stay: Payer: Medicare Other | Admitting: Registered Nurse

## 2014-10-10 ENCOUNTER — Encounter
Admission: AD | Disposition: A | Discharge status: Home / Self Care | Payer: Self-pay | Source: Ambulatory Visit | Attending: Internal Medicine

## 2014-10-10 DIAGNOSIS — R05 Cough: Secondary | ICD-10-CM

## 2014-10-10 HISTORY — PX: ESOPHAGOGASTRODUODENOSCOPY: SHX5428

## 2014-10-10 LAB — TYPE AND SCREEN
ABO/RH(D): AB POS
Antibody Screen: NEGATIVE
UNIT DIVISION: 0
UNIT DIVISION: 0
UNIT DIVISION: 0
Unit division: 0
Unit division: 0
Unit division: 0

## 2014-10-10 LAB — CBC
HCT: 29.4 % — ABNORMAL LOW (ref 35.0–47.0)
Hemoglobin: 10.2 g/dL — ABNORMAL LOW (ref 12.0–16.0)
MCH: 30.4 pg (ref 26.0–34.0)
MCHC: 34.6 g/dL (ref 32.0–36.0)
MCV: 87.9 fL (ref 80.0–100.0)
PLATELETS: 167 10*3/uL (ref 150–440)
RBC: 3.34 MIL/uL — AB (ref 3.80–5.20)
RDW: 16.6 % — ABNORMAL HIGH (ref 11.5–14.5)
WBC: 6.8 10*3/uL (ref 3.6–11.0)

## 2014-10-10 SURGERY — EGD (ESOPHAGOGASTRODUODENOSCOPY)
Anesthesia: General

## 2014-10-10 MED ORDER — PROPOFOL 500 MG/50ML IV EMUL
INTRAVENOUS | Status: DC | PRN
  Administered 2014-10-10: 140 ug/kg/min via INTRAVENOUS

## 2014-10-10 MED ORDER — MIDAZOLAM HCL 2 MG/2ML IJ SOLN
INTRAMUSCULAR | Status: DC | PRN
  Administered 2014-10-10: 1 mg via INTRAVENOUS

## 2014-10-10 MED ORDER — POLYETHYLENE GLYCOL 3350 17 GM/SCOOP PO POWD
1.0000 | Freq: Once | ORAL | Status: AC
  Administered 2014-10-10: 255 g via ORAL
  Filled 2014-10-10: qty 255

## 2014-10-10 MED ORDER — SODIUM CHLORIDE 0.45 % IV SOLN
INTRAVENOUS | Status: DC
  Administered 2014-10-10 – 2014-10-11 (×2): via INTRAVENOUS

## 2014-10-10 MED ORDER — SODIUM CHLORIDE 0.9 % IV SOLN: INTRAVENOUS | Status: DC

## 2014-10-10 MED ORDER — AMOXICILLIN-POT CLAVULANATE 875-125 MG PO TABS
1.0000 | ORAL_TABLET | Freq: Two times a day (BID) | ORAL | Status: DC
  Administered 2014-10-10 – 2014-10-12 (×4): 1 via ORAL
  Filled 2014-10-10 (×5): qty 1

## 2014-10-10 MED ORDER — IPRATROPIUM-ALBUTEROL 0.5-2.5 (3) MG/3ML IN SOLN
3.0000 mL | Freq: Once | RESPIRATORY_TRACT | Status: AC
  Administered 2014-10-10: 3 mL via RESPIRATORY_TRACT

## 2014-10-10 MED ORDER — GLYCOPYRROLATE 0.2 MG/ML IJ SOLN
INTRAMUSCULAR | Status: DC | PRN
Start: 2014-10-10 — End: 2014-10-10
  Administered 2014-10-10: 0.2 mg via INTRAVENOUS

## 2014-10-10 MED ORDER — BUTAMBEN-TETRACAINE-BENZOCAINE 2-2-14 % EX AERO
INHALATION_SPRAY | CUTANEOUS | Status: DC | PRN
  Administered 2014-10-10: 1 via TOPICAL

## 2014-10-10 MED ORDER — PROPOFOL 10 MG/ML IV BOLUS
INTRAVENOUS | Status: DC | PRN
  Administered 2014-10-10: 40 mg via INTRAVENOUS

## 2014-10-10 MED ORDER — LIDOCAINE HCL (CARDIAC) 20 MG/ML IV SOLN
INTRAVENOUS | Status: DC | PRN
  Administered 2014-10-10: 30 mg via INTRAVENOUS

## 2014-10-10 MED ORDER — ALFENTANIL 500 MCG/ML IJ INJ
INJECTION | INTRAMUSCULAR | Status: DC | PRN
  Administered 2014-10-10: 250 ug via INTRAVENOUS

## 2014-10-10 NOTE — Consult Note (Signed)
Pt EGD did not show any bleeding site.  Discussed with her to do colonoscopy tomorrow or Monday and she will decide and I will see her at lunch time for her decision.

## 2014-10-10 NOTE — Op Note (Signed)
[facility] Gastroenterology Patient Name: Katrina Kramer Procedure Date: 10/10/2014 8:54 AM MRN: [id number] Account #: [id number] Date of Birth: [birth year] Admit Type: Outpatient Age: 74 Room: [facility] ENDO ROOM 1 Gender: Female Note Status: Finalized Procedure:         Upper GI endoscopy Indications:       Iron deficiency anemia secondary to chronic blood loss,                     [place] Providers:         Manya Silvas, MD Medicines:         Propofol per Anesthesia Complications:     No immediate complications. Procedure:         Pre-Anesthesia Assessment:                    - After reviewing the risks and benefits, the patient was                     deemed in satisfactory condition to undergo the procedure.                    After obtaining informed consent, the endoscope was passed                     under direct vision. Throughout the procedure, the                     patient's blood pressure, pulse, and oxygen saturations                     were monitored continuously. The Endoscope was introduced                     through the mouth, and advanced to the second part of                     duodenum. The upper GI endoscopy was accomplished without                     difficulty. The patient tolerated the procedure well. Findings:      The examined esophagus was normal. GEJ at 39-40cm.      The stomach was normal.      Localized mild inflammation characterized by erythematous spots was       found in the duodenal bulb. The scope passing over this into the       duodenal 2ed portion did not cause any bleeding in the bulb... Impression:        - Normal esophagus.                    - Normal stomach.                    - No specimens collected. Recommendation:    - The findings and recommendations were discussed with the                     patient. Possible colonoscopy tomorrow or Monday Manya Silvas, MD 10/10/2014 9:19:28 AM This report has been  signed electronically. Number of Addenda: 0 Note Initiated On: 10/10/2014 8:54 AM      [facility]

## 2014-10-10 NOTE — Plan of Care (Signed)
Problem: Discharge Progression Outcomes Goal: Other Discharge Outcomes/Goals Outcome: Progressing Plan of care progress to goals: 1. Pain control: No c/o pain 2. Hemodynamically:             -VSS, afebrile               -Hgb 7.4 yesterday morning, received two units of prbc's. Tolerated 2nd transfusion without any difficulties ending at 77. Repeat labs pending for this am. 3. NPO since midnight. EGD planned for today, consent signed. 4. Activity: +1 assist to bathroom, steady gait.

## 2014-10-10 NOTE — Plan of Care (Addendum)
Problem: Discharge Progression Outcomes Goal: Other Discharge Outcomes/Goals Outcome: Progressing Patient has no complaints of pain. VSS. Hemoglobin 10.2. EGD performed with complications. Possible colonoscopy tomorrow or Monday. IV fluids infusing.Tolerating clear liquid diet. Up to chair for most of day.

## 2014-10-10 NOTE — Care Management (Signed)
Admitted to [facility] with the diagnosis of anemia. Lives with husband, Vernard Gambles, [phone]).  Followed by [facility] in the past. Magnet last February x 17 dys. Uses no aids for ambulation. No home oxygen. No Life Alert. Takes care of all activities of daily living herself, doesn't drive. Last seen Dr. Sanda Klein 2 weeks ago. Husband will transport. NPO. Scheduled for EGD today. Shelbie Ammons RN MSN Care Management [phone]

## 2014-10-10 NOTE — Anesthesia Preprocedure Evaluation (Addendum)
Anesthesia Evaluation  Patient identified by MRN, date of birth, ID band Patient awake    Reviewed: Allergy & Precautions, H&P , NPO status , Patient's Chart, lab work & pertinent test results  History of Anesthesia Complications Negative for: history of anesthetic complications  Airway Mallampati: III  TM Distance: >3 FB Neck ROM: limited    Dental  (+) Poor Dentition, Chipped, Missing, Upper Dentures, Lower Dentures   Pulmonary shortness of breath, COPD, former smoker,    Pulmonary exam normal breath sounds clear to auscultation       Cardiovascular Exercise Tolerance: Good hypertension, (-) Past MI Normal cardiovascular exam+ dysrhythmias Atrial Fibrillation  Rhythm:regular Rate:Normal     Neuro/Psych negative neurological ROS  negative psych ROS   GI/Hepatic Neg liver ROS, GERD  Controlled,  Endo/Other  Hypothyroidism   Renal/GU negative Renal ROS  negative genitourinary   Musculoskeletal   Abdominal   Peds  Hematology  (+) Blood dyscrasia, anemia ,   Anesthesia Other Findings Past Medical History:   Atrial fibrillation ([place])                                      Comment:Dr. Humphrey Rolls, cardiologist   Anemia                                                       Hypothyroidism                                               Lung cancer ([place])                                            Hypertension                                                 Lung cancer ([place])                                            Anemia                                                       Reproductive/Obstetrics negative OB ROS                            Anesthesia Physical Anesthesia Plan  ASA: III  Anesthesia Plan: General   Post-op Pain Management:    Induction:   Airway Management Planned:   Additional Equipment:   Intra-op Plan:   Post-operative Plan:   Informed Consent: I have reviewed the  patients History and Physical, chart,  labs and discussed the procedure including the risks, benefits and alternatives for the proposed anesthesia with the patient or authorized representative who has indicated his/her understanding and acceptance.   Dental Advisory Given  Plan Discussed with: Anesthesiologist, CRNA and Surgeon  Anesthesia Plan Comments:         Anesthesia Quick Evaluation

## 2014-10-10 NOTE — Care Management Important Message (Signed)
Important Message  Patient Details  Name: Katrina Kramer MRN: [id number] Date of Birth: 08-Dec-1940   Medicare Important Message Given:  Yes-second notification given    Shelbie Ammons, RN 10/10/2014, 8:08 AM

## 2014-10-10 NOTE — Progress Notes (Signed)
Dr Johny Chess normal saline '@20ml'$ /hr through port

## 2014-10-10 NOTE — Anesthesia Postprocedure Evaluation (Signed)
  Anesthesia Post-op Note  Patient: Katrina Kramer  Procedure(s) Performed: Procedure(s): ESOPHAGOGASTRODUODENOSCOPY (EGD) (N/A)  Anesthesia type:General  Patient location: PACU  Post pain: Pain level controlled  Post assessment: Post-op Vital signs reviewed, Patient's Cardiovascular Status Stable, Respiratory Function Stable, Patent Airway and No signs of Nausea or vomiting  Post vital signs: Reviewed and stable  Last Vitals:  Filed Vitals:   10/10/14 1000  BP:   Pulse: 94  Temp:   Resp: 17    Level of consciousness: awake, alert  and patient cooperative  Complications: No apparent anesthesia complications

## 2014-10-10 NOTE — Progress Notes (Signed)
Dr Vira Agar called for diet order--Clear liquid diet

## 2014-10-10 NOTE — Progress Notes (Signed)
ONCOLOGY PROGRESS NOTE -   HISTORY OF PRESENT ILLNESS:  Patient states that she continues to have chronic weakness but overall is feeling better, less tired at rest. She is sitting in chair today. EGD done today mostly unremarkable. Denies any active blood in the stools or melena. No other bleeding symptoms. Beginning to eat better. Chronic cough is at baseline. No new shortness of breath at rest, chest pain dizziness or palpitation. She received 2 units of packed red blood cells overnight and hemoglobin today is better at 10.2. Being evaluated by GI. No fever or chills.     REVIEW OF SYSTEMS:  As in HPI above. In addition, no fever fevers, chills or night sweats. No new headaches or focal weakness. No sore throat or dysphagia. No abdominal pain, constipation, diarrhea, dysuria or hematuria. No new skin rash. Denies new bone pains. Denies loss of consciousness or seizures. No polyuria or polydipsia.   PAST MEDICAL HISTORY:   COPD, ex-smoker  Hypertension Iron deficiency anemia Diverticulosis GERD, history of gastric ulcers EGD and colonoscopy 2010 Bronchoscopy January 2013 showed non-small cell carcinoma Gallstone pancreatitis (acute) April 2013, status post procedure at [facility]  PAST SURGICAL HISTORY:  As above  FAMILY HISTORY:  Her mother died of cancer of unknown primary, uncle had lung cancer. Otherwise noncontributory.  SOCIAL HISTORY:  Social History  Substance Use Topics  . Smoking status: Former Research scientist (life sciences)  . Smokeless tobacco: Never Used  . Alcohol Use: No    No Known Allergies  Current Outpatient Prescriptions  Medication Sig    . albuterol (PROVENTIL HFA;VENTOLIN HFA) 108 (90 BASE) MCG/ACT inhaler Inhale 2 puffs into the lungs every 6 (six) hours as needed for wheezing or shortness of breath.    Marland Kitchen albuterol (PROVENTIL) (2.5 MG/3ML)  0.083% nebulizer solution Take 2.5 mg by nebulization every 4 (four) hours as needed for wheezing or shortness of breath.    Marland Kitchen amiodarone (PACERONE) 200 MG tablet Take 200 mg by mouth daily.    Marland Kitchen atorvastatin (LIPITOR) 20 MG tablet Take 20 mg by mouth at bedtime.     . benzonatate (TESSALON) 100 MG capsule Take 100 mg by mouth 3 (three) times daily as needed for cough.    . carvedilol (COREG) 12.5 MG tablet Take 12.5 mg by mouth daily. 1/2 pill a day    . chlorpheniramine (CHLOR-TRIMETON) 4 MG tablet Take 4 mg by mouth 3 (three) times daily.    . ferrous sulfate 325 (65 FE) MG tablet Take 325 mg by mouth 3 (three) times daily.    . folic acid (FOLVITE) 1 MG tablet Take 1 mg by mouth daily.    Marland Kitchen gabapentin (NEURONTIN) 100 MG capsule Take 1 capsule (100 mg total) by mouth 2 (two) times daily.    Marland Kitchen guaifenesin (ROBITUSSIN) 100 MG/5ML syrup Take 100 mg by mouth every 6 (six) hours as needed for cough.    . isosorbide mononitrate (IMDUR) 30 MG 24 hr tablet Take 30 mg by mouth daily.    Marland Kitchen liothyronine (CYTOMEL) 25 MCG tablet Take 1 tablet (25 mcg total) by mouth daily.    . pantoprazole (PROTONIX) 40 MG tablet Take 40 mg by mouth 2 (two) times daily.     . potassium chloride SA (K-DUR,KLOR-CON) 20 MEQ tablet Take 1 tablet (20 mEq total) by mouth daily.    . predniSONE (DELTASONE) 5 MG tablet Take 2.5 mg by mouth daily with breakfast.    . torsemide (DEMADEX) 10 MG tablet Take 1.5 tablets (15 mg total)  by mouth daily.      PHYSICAL EXAM: VITALS: 98, 94, 19, 101/44, 100% GENERAL: Patient is sitting in chair, still weak but overall looks better, otherwise alert and oriented and in no acute distress. No icterus. Pallor +. HEENT: EOMs intact. No oral thrush. CVS: S1S2, regular LUNGS:  Bilaterally diminished BS more so on right side, occasional rhonchi ABDOMEN: Soft, nontender.  EXTREMITIES: No pedal edema.  LAB RESULTS: 10/7 - WBC 6.8, hemoglobin 10.2, platelets 167 10/6 - WBC 6.9, platelets 187, hemoglobin 7.4, hematocrit 22.2. Hemoglobin 5.1 on 10/08/14.  STUDIES: 07/02/14 - CT Chest. IMPRESSION: Some interval further volume loss in the right hemi thorax with the increasing collapse/ consolidation anteriorly in the region of apparent fibrosis. This is associated with development of some septal thickening along the inferior right lung and continued close attention recommended as recurrent disease cannot be excluded.  ASSESSMENT / PLAN:  1. Acute on Chronic anemia, malena/GI bleed - overall still extremely weak but feels less tired, sitting in chair today. Hemoglobin is better at 10.2 g. Continue to monitor blood counts and transfuse as indicated. EGD mostly unremarkable, tentatively being considered for colonoscopy tomorrow. Will repeat CBC tomorrow.  2. 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 Chronic cough is at baseline. No pain issues at this time.  3. COPD, chest x-ray showing increasing infiltrate - also has bothersome cough, dyspnea. Could have bronchitis versus underlying pneumonia but difficult to accurately make determination since she has chronic right lung changes. Will add empiric oral and divided Augmentin 875 mg by mouth twice a day 8 days. 4. Hypertension - BP doing well, continue home medications. 5. Pain - no issues at this time. 6. Disposition - likely discharge in 1-2 days if hemoglobin remains stable and after planned procedures are done. Patient and husband present were explained above details, they are agreeable to this plan.

## 2014-10-10 NOTE — Transfer of Care (Signed)
Immediate Anesthesia Transfer of Care Note  Patient: Katrina Kramer  Procedure(s) Performed: Procedure(s): ESOPHAGOGASTRODUODENOSCOPY (EGD) (N/A)  Patient Location: PACU and Endoscopy Unit  Anesthesia Type:General  Level of Consciousness: sedated  Airway & Oxygen Therapy: Patient Spontanous Breathing and Patient connected to nasal cannula oxygen  Post-op Assessment: Report given to RN and Post -op Vital signs reviewed and stable  Post vital signs: Reviewed and stable  Last Vitals:  Filed Vitals:   10/10/14 0923  BP: 122/59  Pulse:   Temp: 36.4 C  Resp: 22    Complications: No apparent anesthesia complications

## 2014-10-10 NOTE — Anesthesia Procedure Notes (Signed)
Date/Time: 10/10/2014 9:00 AM Performed by: Doreen Salvage Pre-anesthesia Checklist: Patient identified, Emergency Drugs available, Suction available and Patient being monitored Patient Re-evaluated:Patient Re-evaluated prior to inductionOxygen Delivery Method: Nasal cannula Intubation Type: IV induction Dental Injury: Teeth and Oropharynx as per pre-operative assessment  Comments: Nasal cannula with etCO2 monitoring

## 2014-10-11 ENCOUNTER — Encounter
Admission: AD | Disposition: A | Discharge status: Home / Self Care | Payer: Self-pay | Source: Ambulatory Visit | Attending: Internal Medicine

## 2014-10-11 ENCOUNTER — Inpatient Hospital Stay: Payer: Medicare Other | Admitting: Anesthesiology

## 2014-10-11 DIAGNOSIS — E876 Hypokalemia: Secondary | ICD-10-CM

## 2014-10-11 HISTORY — PX: COLONOSCOPY: SHX5424

## 2014-10-11 LAB — CBC WITH DIFFERENTIAL/PLATELET
Basophils Absolute: 0.1 10*3/uL (ref 0–0.1)
Basophils Relative: 2 %
EOS ABS: 0.1 10*3/uL (ref 0–0.7)
EOS PCT: 1 %
HCT: 29.1 % — ABNORMAL LOW (ref 35.0–47.0)
Hemoglobin: 9.9 g/dL — ABNORMAL LOW (ref 12.0–16.0)
LYMPHS ABS: 0.4 10*3/uL — AB (ref 1.0–3.6)
LYMPHS PCT: 7 %
MCH: 30 pg (ref 26.0–34.0)
MCHC: 34.1 g/dL (ref 32.0–36.0)
MCV: 88.2 fL (ref 80.0–100.0)
MONOS PCT: 12 %
Monocytes Absolute: 0.7 10*3/uL (ref 0.2–0.9)
Neutro Abs: 4.5 10*3/uL (ref 1.4–6.5)
Neutrophils Relative %: 78 %
PLATELETS: 170 10*3/uL (ref 150–440)
RBC: 3.3 MIL/uL — ABNORMAL LOW (ref 3.80–5.20)
RDW: 16.7 % — ABNORMAL HIGH (ref 11.5–14.5)
WBC: 5.8 10*3/uL (ref 3.6–11.0)

## 2014-10-11 LAB — SAMPLE TO BLOOD BANK

## 2014-10-11 LAB — BASIC METABOLIC PANEL
ANION GAP: 6 (ref 5–15)
BUN: 20 mg/dL (ref 6–20)
CALCIUM: 7.9 mg/dL — AB (ref 8.9–10.3)
CHLORIDE: 103 mmol/L (ref 101–111)
CO2: 25 mmol/L (ref 22–32)
Creatinine, Ser: 1.67 mg/dL — ABNORMAL HIGH (ref 0.44–1.00)
GFR calc non Af Amer: 30 mL/min — ABNORMAL LOW (ref >60)
GFR, EST AFRICAN AMERICAN: 34 mL/min — AB (ref >60)
GLUCOSE: 87 mg/dL (ref 65–99)
Potassium: 3.1 mmol/L — ABNORMAL LOW (ref 3.5–5.1)
Sodium: 134 mmol/L — ABNORMAL LOW (ref 135–145)

## 2014-10-11 SURGERY — COLONOSCOPY
Anesthesia: General

## 2014-10-11 SURGERY — COLONOSCOPY WITH PROPOFOL
Anesthesia: Monitor Anesthesia Care

## 2014-10-11 MED ORDER — LIDOCAINE HCL (CARDIAC) 20 MG/ML IV SOLN
INTRAVENOUS | Status: DC | PRN
  Administered 2014-10-11: 40 mg via INTRAVENOUS

## 2014-10-11 MED ORDER — ALBUTEROL SULFATE (2.5 MG/3ML) 0.083% IN NEBU: 2.5000 mg | INHALATION_SOLUTION | RESPIRATORY_TRACT | Status: DC | PRN

## 2014-10-11 MED ORDER — EPHEDRINE SULFATE 50 MG/ML IJ SOLN
INTRAMUSCULAR | Status: DC | PRN
  Administered 2014-10-11 (×5): 10 mg via INTRAVENOUS

## 2014-10-11 MED ORDER — POTASSIUM CHLORIDE CRYS ER 20 MEQ PO TBCR: 20.0000 meq | EXTENDED_RELEASE_TABLET | Freq: Every day | ORAL | Status: DC

## 2014-10-11 MED ORDER — FLEET ENEMA 7-19 GM/118ML RE ENEM
2.0000 | ENEMA | Freq: Once | RECTAL | Status: AC
  Administered 2014-10-11: 2 via RECTAL

## 2014-10-11 MED ORDER — LIOTHYRONINE SODIUM 25 MCG PO TABS: 25.0000 ug | ORAL_TABLET | Freq: Every day | ORAL | Status: DC

## 2014-10-11 MED ORDER — PROPOFOL 500 MG/50ML IV EMUL
INTRAVENOUS | Status: DC | PRN
  Administered 2014-10-11: 120 ug/kg/min via INTRAVENOUS

## 2014-10-11 MED ORDER — CARVEDILOL 3.125 MG PO TABS: 3.1250 mg | ORAL_TABLET | Freq: Two times a day (BID) | ORAL | Status: DC

## 2014-10-11 MED ORDER — MIDAZOLAM HCL 2 MG/2ML IJ SOLN
INTRAMUSCULAR | Status: DC | PRN
  Administered 2014-10-11: 1 mg via INTRAVENOUS

## 2014-10-11 MED ORDER — PHENYLEPHRINE HCL 10 MG/ML IJ SOLN
INTRAMUSCULAR | Status: DC | PRN
  Administered 2014-10-11 (×2): 100 ug via INTRAVENOUS

## 2014-10-11 MED ORDER — POTASSIUM CHLORIDE 10 MEQ/50ML IV SOLN
10.0000 meq | INTRAVENOUS | Status: AC
  Administered 2014-10-11 (×4): 10 meq via INTRAVENOUS
  Filled 2014-10-11 (×4): qty 50

## 2014-10-11 MED ORDER — ALFENTANIL 500 MCG/ML IJ INJ
INJECTION | INTRAMUSCULAR | Status: DC | PRN
  Administered 2014-10-11: 250 ug via INTRAVENOUS

## 2014-10-11 NOTE — Anesthesia Preprocedure Evaluation (Signed)
Anesthesia Evaluation  Patient identified by MRN, date of birth, ID band Patient awake    Reviewed: Allergy & Precautions, NPO status , Patient's Chart, lab work & pertinent test results  History of Anesthesia Complications Negative for: history of anesthetic complications  Airway Mallampati: II       Dental  (+) Poor Dentition, Partial Upper   Pulmonary COPD, former smoker,    + rhonchi  + decreased breath sounds      Cardiovascular hypertension, Pt. on medications Normal cardiovascular exam     Neuro/Psych    GI/Hepatic Neg liver ROS, GERD  ,  Endo/Other  Hypothyroidism   Renal/GU negative Renal ROS     Musculoskeletal negative musculoskeletal ROS (+)   Abdominal Normal abdominal exam  (+)   Peds  Hematology  (+) anemia ,   Anesthesia Other Findings   Reproductive/Obstetrics                             Anesthesia Physical Anesthesia Plan  ASA: III  Anesthesia Plan: General   Post-op Pain Management:    Induction: Intravenous  Airway Management Planned: Nasal Cannula  Additional Equipment:   Intra-op Plan:   Post-operative Plan:   Informed Consent: I have reviewed the patients History and Physical, chart, labs and discussed the procedure including the risks, benefits and alternatives for the proposed anesthesia with the patient or authorized representative who has indicated his/her understanding and acceptance.     Plan Discussed with: CRNA  Anesthesia Plan Comments:         Anesthesia Quick Evaluation

## 2014-10-11 NOTE — Transfer of Care (Signed)
Immediate Anesthesia Transfer of Care Note  Patient: Katrina Kramer  Procedure(s) Performed: Procedure(s): COLONOSCOPY (N/A)  Patient Location: PACU and Endoscopy Unit  Anesthesia Type:General  Level of Consciousness: sedated  Airway & Oxygen Therapy: Patient Spontanous Breathing and Patient connected to nasal cannula oxygen  Post-op Assessment: Report given to RN and Post -op Vital signs reviewed and stable  Post vital signs: Reviewed and stable  Last Vitals: 100% sat 77hr 16 resp 101/48 Filed Vitals:   10/11/14 0811  BP: 110/51  Pulse: 85  Temp: 37.2 C  Resp: 17    Complications: No apparent anesthesia complications

## 2014-10-11 NOTE — Plan of Care (Signed)
Problem: Discharge Progression Outcomes Goal: Other Discharge Outcomes/Goals Plan of care progress to goal: Hemodynamically: VSS, no active bleeding Diet: NPO for coloscopy Activity: one assist up to [facility]  Pt for colonoscopy today and then possible discharge home

## 2014-10-11 NOTE — Progress Notes (Signed)
ONCOLOGY PROGRESS NOTE -   HISTORY OF PRESENT ILLNESS:  Patient had colonoscopy today. She is sitting in chair today. Denies any active blood in the stools or melena. No other bleeding symptoms. Chronic cough is at baseline. No new shortness of breath at rest, chest pain dizziness or palpitation. She is s/p packed red blood cells tx. Being evaluated by GI. No fever or chills.    REVIEW OF SYSTEMS:  As in HPI above. In addition, no fever fevers, chills. No new headaches or focal weakness. No sore throat or dysphagia. No abdominal pain, constipation, diarrhea, dysuria or hematuria. No new skin rash. Denies new bone pains. No polyuria or polydipsia.   PAST MEDICAL HISTORY:   COPD, ex-smoker  Hypertension Iron deficiency anemia Diverticulosis GERD, history of gastric ulcers EGD and colonoscopy 2010 Bronchoscopy January 2013 showed non-small cell carcinoma Gallstone pancreatitis (acute) April 2013, status post procedure at [facility]  PAST SURGICAL HISTORY:  As above  FAMILY HISTORY:  Her mother died of cancer of unknown primary, uncle had lung cancer. Otherwise noncontributory.  SOCIAL HISTORY:  Social History  Substance Use Topics  . Smoking status: Former Research scientist (life sciences)  . Smokeless tobacco: Never Used  . Alcohol Use: No    No Known Allergies  Current Outpatient Prescriptions  Medication Sig    . albuterol (PROVENTIL HFA;VENTOLIN HFA) 108 (90 BASE) MCG/ACT inhaler Inhale 2 puffs into the lungs every 6 (six) hours as needed for wheezing or shortness of breath.    Marland Kitchen albuterol (PROVENTIL) (2.5 MG/3ML) 0.083% nebulizer solution Take 2.5 mg by nebulization every 4 (four) hours as needed for wheezing or shortness of breath.    Marland Kitchen amiodarone (PACERONE) 200 MG tablet Take 200 mg by mouth daily.    Marland Kitchen  atorvastatin (LIPITOR) 20 MG tablet Take 20 mg by mouth at bedtime.     . benzonatate (TESSALON) 100 MG capsule Take 100 mg by mouth 3 (three) times daily as needed for cough.    . carvedilol (COREG) 12.5 MG tablet Take 12.5 mg by mouth daily. 1/2 pill a day    . chlorpheniramine (CHLOR-TRIMETON) 4 MG tablet Take 4 mg by mouth 3 (three) times daily.    . ferrous sulfate 325 (65 FE) MG tablet Take 325 mg by mouth 3 (three) times daily.    . folic acid (FOLVITE) 1 MG tablet Take 1 mg by mouth daily.    Marland Kitchen gabapentin (NEURONTIN) 100 MG capsule Take 1 capsule (100 mg total) by mouth 2 (two) times daily.    Marland Kitchen guaifenesin (ROBITUSSIN) 100 MG/5ML syrup Take 100 mg by mouth every 6 (six) hours as needed for cough.    . isosorbide mononitrate (IMDUR) 30 MG 24 hr tablet Take 30 mg by mouth daily.    Marland Kitchen liothyronine (CYTOMEL) 25 MCG tablet Take 1 tablet (25 mcg total) by mouth daily.    . pantoprazole (PROTONIX) 40 MG tablet Take 40 mg by mouth 2 (two) times daily.     . potassium chloride SA (K-DUR,KLOR-CON) 20 MEQ tablet Take 1 tablet (20 mEq total) by mouth daily.    . predniSONE (DELTASONE) 5 MG tablet Take 2.5 mg by mouth daily with breakfast.    . torsemide (DEMADEX) 10 MG tablet Take 1.5 tablets (15 mg total) by mouth daily.      PHYSICAL EXAM: VITALS: 97.7, 79, 19, 110/59, 96% GENERAL: still weak but overall looks better, otherwise alert and oriented and in no acute distress. No icterus. Pallor +. HEENT: EOMs intact. No oral thrush.  CVS: S1S2, regular LUNGS: Bilaterally diminished BS more so on right side, occasional rhonchi ABDOMEN: Soft, nontender.  EXTREMITIES: No pedal edema.  LAB RESULTS: 10/8 - Hb 9.9, K+ 3.1. 10/7 - WBC 6.8,  hemoglobin 10.2, platelets 167 10/6 - WBC 6.9, platelets 187, hemoglobin 7.4, hematocrit 22.2. Hemoglobin 5.1 on 10/08/14.  STUDIES: 07/02/14 - CT Chest. IMPRESSION: Some interval further volume loss in the right hemi thorax with the increasing collapse/ consolidation anteriorly in the region of apparent fibrosis. This is associated with development of some septal thickening along the inferior right lung and continued close attention recommended as recurrent disease cannot be excluded.  ASSESSMENT / PLAN:  1. Acute on Chronic anemia, malena/GI bleed. EGD unremarkable. 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 Hemoglobin is steady at 9.9 g. Continue to monitor blood counts and transfuse as indicated.   2. 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 Chronic cough is at baseline. No pain issues at this time.  3. COPD, chest x-ray showing increasing infiltrate - also has bothersome cough, dyspnea. On augmentin for bronchitis versus underlying pneumonia. 4. Hypertension - BP doing well, continue home medications. 5. Pain - no issues at this time. 6.  7. Hypokalemia - IV KCl 40 meq x 1 today. 8. Disposition - likely discharge in 1-2 days if hemoglobin remains stable and after planned procedures are done. Patient and husband present were explained above details, they are agreeable to this plan.

## 2014-10-11 NOTE — Op Note (Signed)
[facility] Gastroenterology Patient Name: Katrina Kramer Procedure Date: 10/11/2014 11:52 AM MRN: [id number] Account #: [id number] Date of Birth: August 02, 1940 Admit Type: Inpatient Age: 74 Room: [facility] ENDO ROOM 4 Gender: Female Note Status: Finalized Procedure:         Colonoscopy Indications:       Melena Providers:         Manya Silvas, MD Medicines:         Propofol per Anesthesia Complications:     No immediate complications. Procedure:         Pre-Anesthesia Assessment:                    - After reviewing the risks and benefits, the patient was                     deemed in satisfactory condition to undergo the procedure.                    After obtaining informed consent, the colonoscope was                     passed under direct vision. Throughout the procedure, the                     patient's blood pressure, pulse, and oxygen saturations                     were monitored continuously. The Colonoscope was                     introduced through the anus and advanced to the the cecum,                     identified by appendiceal orifice and ileocecal valve. The                     colonoscopy was performed without difficulty. The patient                     tolerated the procedure well. The quality of the bowel                     preparation was adequate to identify polyps. Findings:      Multiple small patchy angioectasias without bleeding were found in the       ascending colon and in the cecum. Coagulation for tissue destruction       using argon plasma at 0.4 liters/minute and 20 watts was successful. To       prevent bleeding post-intervention, three hemostatic clips were       successfully placed. There was no bleeding at the end of the procedure.      Internal hemorrhoids were found during endoscopy. The hemorrhoids were       small and Grade I (internal hemorrhoids that do not prolapse). Impression:        - Multiple non-bleeding colonic  angioectasias. Treated                     with argon plasma coagulation (APC). Clips were placed.                    - Internal hemorrhoids.                    -  No specimens collected. Recommendation:    - The findings and recommendations were discussed with the                     patient. Manya Silvas, MD 10/11/2014 1:34:33 PM This report has been signed electronically. Number of Addenda: 0 Note Initiated On: 10/11/2014 11:52 AM Scope Withdrawal Time: 0 hours 17 minutes 18 seconds  Total Procedure Duration: 0 hours 30 minutes 40 seconds       [facility]

## 2014-10-11 NOTE — Anesthesia Postprocedure Evaluation (Signed)
  Anesthesia Post-op Note  Patient: Katrina Kramer  Procedure(s) Performed: Procedure(s): COLONOSCOPY (N/A)  Anesthesia type:General  Patient location: PACU  Post pain: Pain level controlled  Post assessment: Post-op Vital signs reviewed, Patient's Cardiovascular Status Stable, Respiratory Function Stable, Patent Airway and No signs of Nausea or vomiting  Post vital signs: Reviewed and stable  Last Vitals:  Filed Vitals:   10/11/14 1330  BP: 101/48  Pulse: 82  Temp: 37.2 C  Resp: 16    Level of consciousness: awake, alert  and patient cooperative  Complications: No apparent anesthesia complications

## 2014-10-11 NOTE — Plan of Care (Signed)
Problem: Discharge Progression Outcomes Goal: Other Discharge Outcomes/Goals Outcome: Progressing Patient had no c/o pain this shift Up to [facility] with one person assist  Patient had colonoscopy this shift and tolerated procedure well Advanced to a soft diet and is tolerating new diet well Possible discharge tomorrow

## 2014-10-12 LAB — HEMOGLOBIN: HEMOGLOBIN: 9.9 g/dL — AB (ref 12.0–16.0)

## 2014-10-12 MED ORDER — AMOXICILLIN-POT CLAVULANATE 500-125 MG PO TABS
1.0000 | ORAL_TABLET | Freq: Two times a day (BID) | ORAL | Status: DC
  Filled 2014-10-12 (×2): qty 1

## 2014-10-12 NOTE — Discharge Summary (Signed)
DISCHARGE SUMMARY -  DISCHARGE DIAGNOSIS: Severe symptomatic acute on chronic anemia secondary to GI blood loss. Received blood transfusion. EGD and colonoscopy by Dr. Vira Agar revealed nonbleeding AVMs and underwent argon laser coagulation.  Other diagnoses: History of recurrent right non-small cell lung cancer. COPD, ex-smoker  Hypertension Iron deficiency anemia Diverticulosis GERD, history of gastric ulcers Bronchoscopy January 2013 showed non-small cell carcinoma H/o Gallstone pancreatitis (acute) April 2013, status post procedure at [facility].  For past medical history, family history, social history, home medications, physical exam and labs - please suffered history and physical note for details.   HOSPITAL COURSE: Patient was admitted to hospital on October 5 when she presented to [facility] with severe drop in hemoglobin down to 5.1 g. She gives history of intermittent dark stools. She received packed red blood cell transfusion support, got 4 units total getting this admission. Hemoglobin improved to 10.1 on October 7 and remained steady at 9.9 on October 8 and October 9. Patient has chronic severe weakness but did feel better overall after transfusions. She was seen by GI Dr. Tiffany Kocher and underwent EGD and colonoscopy which reported nonbleeding AVMs and underwent argon laser coagulation. Chest x-ray was done since she has chronic cough, it continues to show significant right lung changes, patient has had chronic changes on serial CT scan since she has had recurrent lung cancer and treatment in the past. She was treated with empiric oral Augmentin, she remained afebrile. At the time of discharge on October 9, her cough and dyspnea but at baseline. No purulent sputum, chest pain or hemoptysis. No fever or chills. Patient was discharged home with instruction to follow up at [facility] on October 12 or 13 with repeat blood counts for close monitoring of anemia. In between visits, the patient has  been advised to call or come to the ER in case of fevers, chills, bleeding, acute sickness or worsening symptoms. Patient and husband present at bedside on agreeable to this plan.  DISCHARGE MEDICATIONS: Albuterol inhaler 2 puffs every 6 hours when necessary Amiodarone 200 mg by mouth daily Lipitor 20 mg by mouth daily at bedtime Tessalon pearls 100 mg by mouth 3 times a day when necessary for cough Coreg 3.125 mg by mouth twice a day Chlorpheniramine 4 mg by mouth 3 times a day Ferrous sulfate 325 mg by mouth 3 times a day Neurontin 100 mg by mouth twice a day Imdur 30 mg 24-hour tablet once daily Cytomel 25 g once daily Protonix 40 mg by mouth twice a day Torsemide 10 mg tablet, take 1.5 tablets by mouth daily.   Discharge Diet: 2 g sodium.  Discharge Follow-up: At [facility] on October 12 or 13th with repeat blood counts for close monitoring of anemia.

## 2014-10-12 NOTE — Progress Notes (Signed)
ONCOLOGY PROGRESS NOTE -   HISTORY OF PRESENT ILLNESS:  Patient resting in bed, alert and oriented, states that she does feel better overall and is up to going home today. She denies any ongoing bleeding symptoms. States that she ate better today. Denies any chest pain or abdominal pain. Chronic cough is at baseline. No new shortness of breath at rest, chest pain dizziness or palpitation. She is s/p packed red blood cells . She had colonoscopy yesterday which showed nonbleeding AVMs and underwent argon laser coagulation.    REVIEW OF SYSTEMS:  As in HPI above. In addition, no fevers or chills. No new headaches or focal weakness. No sore throat or dysphagia. Denies abdominal pain, constipation, diarrhea, dysuria or hematuria. No new skin rash. Denies new bone pains. No polyuria or polydipsia.   PAST MEDICAL HISTORY:   COPD, ex-smoker  Hypertension Iron deficiency anemia Diverticulosis GERD, history of gastric ulcers EGD and colonoscopy 2010 Bronchoscopy January 2013 showed non-small cell carcinoma Gallstone pancreatitis (acute) April 2013, status post procedure at [facility]  PAST SURGICAL HISTORY:  As above  FAMILY HISTORY:  Her mother died of cancer of unknown primary, uncle had lung cancer. Otherwise noncontributory.  SOCIAL HISTORY:  Social History  Substance Use Topics  . Smoking status: Former Research scientist (life sciences)  . Smokeless tobacco: Never Used  . Alcohol Use: No    No Known Allergies  Current Outpatient Prescriptions  Medication Sig    . albuterol (PROVENTIL HFA;VENTOLIN HFA) 108 (90 BASE) MCG/ACT inhaler Inhale 2 puffs into the lungs every 6 (six) hours as needed for wheezing or shortness of breath.    Marland Kitchen albuterol (PROVENTIL) (2.5 MG/3ML) 0.083% nebulizer solution Take 2.5 mg by nebulization every 4  (four) hours as needed for wheezing or shortness of breath.    Marland Kitchen amiodarone (PACERONE) 200 MG tablet Take 200 mg by mouth daily.    Marland Kitchen atorvastatin (LIPITOR) 20 MG tablet Take 20 mg by mouth at bedtime.     . benzonatate (TESSALON) 100 MG capsule Take 100 mg by mouth 3 (three) times daily as needed for cough.    . carvedilol (COREG) 12.5 MG tablet Take 12.5 mg by mouth daily. 1/2 pill a day    . chlorpheniramine (CHLOR-TRIMETON) 4 MG tablet Take 4 mg by mouth 3 (three) times daily.    . ferrous sulfate 325 (65 FE) MG tablet Take 325 mg by mouth 3 (three) times daily.    . folic acid (FOLVITE) 1 MG tablet Take 1 mg by mouth daily.    Marland Kitchen gabapentin (NEURONTIN) 100 MG capsule Take 1 capsule (100 mg total) by mouth 2 (two) times daily.    Marland Kitchen guaifenesin (ROBITUSSIN) 100 MG/5ML syrup Take 100 mg by mouth every 6 (six) hours as needed for cough.    . isosorbide mononitrate (IMDUR) 30 MG 24 hr tablet Take 30 mg by mouth daily.    Marland Kitchen liothyronine (CYTOMEL) 25 MCG tablet Take 1 tablet (25 mcg total) by mouth daily.    . pantoprazole (PROTONIX) 40 MG tablet Take 40 mg by mouth 2 (two) times daily.     . potassium chloride SA (K-DUR,KLOR-CON) 20 MEQ tablet Take 1 tablet (20 mEq total) by mouth daily.    . predniSONE (DELTASONE) 5 MG tablet Take 2.5 mg by mouth daily with breakfast.    . torsemide (DEMADEX) 10 MG tablet Take 1.5 tablets (15 mg total) by mouth daily.      PHYSICAL EXAM: VITALS: 98.4, 81, 18, 98/42, 97% GENERAL: still weak but  overall looks better, otherwise alert and oriented and in no acute distress. No icterus. Mild  pallor. HEENT: EOMs intact. No oral thrush. CVS: S1S2, regular LUNGS: Bilaterally diminished BS more so on right side, occasional rhonchi ABDOMEN: Soft, nontender.  EXTREMITIES: No pedal edema.  LAB RESULTS: 10/9 - Hb 9.9. 10/8 - Hb 9.9, K+ 3.1. 10/7 - WBC 6.8, hemoglobin 10.2, platelets 167 10/6 - WBC 6.9, platelets 187, hemoglobin 7.4, hematocrit 22.2. Hemoglobin 5.1 on 10/08/14.  STUDIES: 07/02/14 - CT Chest. IMPRESSION: Some interval further volume loss in the right hemi thorax with the increasing collapse/ consolidation anteriorly in the region of apparent fibrosis. This is associated with development of some septal thickening along the inferior right lung and continued close attention recommended as recurrent disease cannot be excluded.  ASSESSMENT / PLAN:  1. Acute on Chronic anemia, malena/GI bleed. EGD unremarkable. Colonoscopy reports non-bleeding AVMs which were treated with argon laser - still anemic but overall feels better, patient feels up to going home today. Hemoglobin remains steady at 9.9 g. Will follow up in 3-4 days at [place] with repeat blood counts for close monitoring of anemia.  2. 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 Chronic cough is at baseline. No pain issues at this time.  3. COPD, chest x-ray showing increasing infiltrate - cough and dyspnea at baseline. No fevers. Will discontinue antibiotic since she has had chronic right lung changes on serial CT scans. 4. Hypertension - BP doing well, continue home medications. 5. Pain - no issues at this time. 6. Disposition - discharged home today. Follow-up at [facility] on October 12 of 13 with repeat blood counts for close monitoring of anemia. InIn between visits, the patient has been advised to call or come to the ER in case of fevers, chills, bleeding,  acute sickness, or new symptoms. . Patient and husband present were explained above details, they are agreeable to this plan.  Discharge time spent is 40 minutes.

## 2014-10-12 NOTE — Plan of Care (Signed)
Problem: Discharge Progression Outcomes Goal: Other Discharge Outcomes/Goals Outcome: Completed/Met Date Met:  10/12/14 VSS. Hgb 9.9. Denies pain. Tolerates diet. Ambulate in the room with standby assist. Discharge instructions given and explained to pt. Educational handout about isosorbide given and explained to pt. Pt verbalized understanding. Pt escorted on a wheelchair by family per pt request.

## 2014-10-12 NOTE — Consult Note (Signed)
Pt doing well. Hgb 9.9, plt 170, WBC 5.8.  VSS no bleeding.  i will sign off, she should be able to go home from a GI perspective, discharge also very dependent on pulmonary status.  Please check a hgb in 2-4 weeks.

## 2014-10-12 NOTE — Discharge Instructions (Signed)
Patient will be contacted on Monday, 10/13/14 regarding follow-up appointment at cancer center.

## 2014-10-12 NOTE — Progress Notes (Signed)
ANTIBIOTIC CONSULT NOTE - INITIAL  Pharmacy Consult for Augmentin Dosing  No Known Allergies  Patient Measurements: Height: '4\' 8"'$  (142.2 cm) Weight: 168 lb 1.6 oz (76.25 kg) IBW/kg (Calculated) : 36.3   Vital Signs: Temp: 98.4 F (36.9 C) (10/09 0915) Temp Source: Oral (10/09 0915) BP: 98/50 mmHg (10/09 0920) Pulse Rate: 81 (10/09 0915) Intake/Output from previous day:   Intake/Output from this shift:    Labs:  Recent Labs  10/10/14 0542 10/11/14 0422 10/12/14 0411  WBC 6.8 5.8  --   HGB 10.2* 9.9* 9.9*  PLT 167 170  --   CREATININE  --  1.67*  --    Estimated Creatinine Clearance: 25.1 mL/min (by C-G formula based on Cr of 1.67). No results for input(s): VANCOTROUGH, VANCOPEAK, VANCORANDOM, GENTTROUGH, GENTPEAK, GENTRANDOM, TOBRATROUGH, TOBRAPEAK, TOBRARND, AMIKACINPEAK, AMIKACINTROU, AMIKACIN in the last 72 hours.   Microbiology: Recent Results (from the past 720 hour(s))  Culture, group A strep     Status: None   Collection Time: 09/16/14  3:19 PM  Result Value Ref Range Status   Specimen Description THROAT  Final   Special Requests NONE  Final   Culture   Final    HEAVY GROWTH GROUP B STREP(S.AGALACTIAE)ISOLATED Virtually 100% of S. agalactiae (Group B) strains are susceptible to Penicillin.  For Penicillin-allergic patients, Erythromycin (85-95% sensitive) and Clindamycin (80% sensitive) are drugs of choice. Contact microbiology lab to request sensitivities if  needed within 7 days.    Report Status 09/18/2014 FINAL  Final    Medical History: Past Medical History  Diagnosis Date  . Atrial fibrillation ([place])     Dr. Humphrey Rolls, cardiologist  . Anemia   . Hypothyroidism   . Lung cancer ([place])   . Hypertension   . Lung cancer ([place])   . Anemia     Medications:  Scheduled:  . amiodarone  200 mg Oral Daily  . amoxicillin-clavulanate  1 tablet Oral Q12H  . atorvastatin  20 mg Oral QHS  . carvedilol  3.125 mg Oral BID WC  . diphenhydrAMINE  25 mg Oral  Once  . folic acid  1 mg Oral Daily  . gabapentin  100 mg Oral BID  . isosorbide mononitrate  30 mg Oral Daily  . liothyronine  25 mcg Oral Daily  . pantoprazole  40 mg Oral BID  . sodium chloride  3 mL Intravenous Q12H  . torsemide  15 mg Oral Daily   Infusions:  . sodium chloride 20 mL/hr at 10/11/14 2002   PRN: albuterol, benzonatate, guaifenesin, sodium chloride  Assessment: Patient ordered Augmentin 875 mg bid with CrCl < 30 ml/min.   Plan:  Will decrease Augmentin dosing to 500 mg iv q 12 hours for remainder of therapy unless CrCl improves.   Ulice Dash D 10/12/2014,11:57 AM

## 2014-10-13 ENCOUNTER — Encounter: Payer: Self-pay | Admitting: Unknown Physician Specialty

## 2014-10-13 ENCOUNTER — Inpatient Hospital Stay: Payer: Medicare Other | Admitting: Family Medicine

## 2014-10-13 ENCOUNTER — Inpatient Hospital Stay: Payer: Medicare Other

## 2014-10-14 ENCOUNTER — Inpatient Hospital Stay: Payer: Medicare Other

## 2014-10-15 ENCOUNTER — Inpatient Hospital Stay: Payer: Medicare Other

## 2014-10-15 ENCOUNTER — Inpatient Hospital Stay (HOSPITAL_BASED_OUTPATIENT_CLINIC_OR_DEPARTMENT_OTHER): Payer: Medicare Other | Admitting: Family Medicine

## 2014-10-15 VITALS — BP 107/67 | HR 83 | Temp 97.8°F | Resp 16 | Wt 150.6 lb

## 2014-10-15 DIAGNOSIS — D649 Anemia, unspecified: Secondary | ICD-10-CM

## 2014-10-15 DIAGNOSIS — C3411 Malignant neoplasm of upper lobe, right bronchus or lung: Secondary | ICD-10-CM

## 2014-10-15 DIAGNOSIS — Z79899 Other long term (current) drug therapy: Secondary | ICD-10-CM

## 2014-10-15 DIAGNOSIS — E039 Hypothyroidism, unspecified: Secondary | ICD-10-CM | POA: Diagnosis not present

## 2014-10-15 DIAGNOSIS — I4891 Unspecified atrial fibrillation: Secondary | ICD-10-CM | POA: Diagnosis not present

## 2014-10-15 DIAGNOSIS — Z87891 Personal history of nicotine dependence: Secondary | ICD-10-CM | POA: Diagnosis not present

## 2014-10-15 DIAGNOSIS — D508 Other iron deficiency anemias: Secondary | ICD-10-CM

## 2014-10-15 DIAGNOSIS — I10 Essential (primary) hypertension: Secondary | ICD-10-CM | POA: Diagnosis not present

## 2014-10-15 LAB — CBC WITH DIFFERENTIAL/PLATELET
Basophils Absolute: 0 10*3/uL (ref 0–0.1)
Basophils Relative: 0 %
EOS ABS: 0.1 10*3/uL (ref 0–0.7)
EOS PCT: 1 %
HCT: 34.5 % — ABNORMAL LOW (ref 35.0–47.0)
Hemoglobin: 11.4 g/dL — ABNORMAL LOW (ref 12.0–16.0)
LYMPHS ABS: 0.5 10*3/uL — AB (ref 1.0–3.6)
Lymphocytes Relative: 7 %
MCH: 29.6 pg (ref 26.0–34.0)
MCHC: 33.1 g/dL (ref 32.0–36.0)
MCV: 89.4 fL (ref 80.0–100.0)
MONO ABS: 0.4 10*3/uL (ref 0.2–0.9)
MONOS PCT: 6 %
Neutro Abs: 6.3 10*3/uL (ref 1.4–6.5)
Neutrophils Relative %: 86 %
PLATELETS: 224 10*3/uL (ref 150–440)
RBC: 3.86 MIL/uL (ref 3.80–5.20)
RDW: 16.7 % — AB (ref 11.5–14.5)
WBC: 7.3 10*3/uL (ref 3.6–11.0)

## 2014-10-15 LAB — TYPE AND SCREEN
ABO/RH(D): AB POS
Antibody Screen: NEGATIVE

## 2014-10-15 NOTE — Progress Notes (Signed)
[facility]  [phone]  [phone]     Katrina Kramer DOB: 06/02/1940  MR#: [id number]  YYT#:[id number]  Patient Care Team: Arnetha Courser, MD as PCP - General (Family Medicine)  CHIEF COMPLAINT:  Chief Complaint  Patient presents with  . Follow-up   Patient with significant past medical history of recurrent non-small cell carcinoma of the right upper lobe. Patient also recently discharged from hospital with acute GI blood loss requiring blood transfusion.  INTERVAL HISTORY:  Patient is here for further follow-up and treatment consideration regarding recurrent right non-small cell carcinoma of the upper lobe of lung as well as chronic anemia. Patient was most recently discharged from the hospital on October 9 following acute GI blood loss. She received several blood transfusions while in the hospital, also had a colonoscopy which showed nonbleeding AVM. These were treated with argon laser. Patient overall feeling better.   REVIEW OF SYSTEMS:   Review of Systems  Constitutional: Negative for fever, chills, weight loss, malaise/fatigue and diaphoresis.  HENT: Negative for congestion, ear discharge, ear pain, hearing loss, nosebleeds, sore throat and tinnitus.   Eyes: Negative for blurred vision, double vision, photophobia, pain, discharge and redness.  Respiratory: Negative for cough, hemoptysis, sputum production, shortness of breath, wheezing and stridor.   Cardiovascular: Negative for chest pain, palpitations, orthopnea, claudication, leg swelling and PND.  Gastrointestinal: Negative for heartburn, nausea, vomiting, abdominal pain, diarrhea, constipation, blood in stool and melena.  Genitourinary: Negative.   Musculoskeletal: Negative.   Skin: Negative.   Neurological: Negative for dizziness, tingling, focal weakness, seizures, weakness and headaches.  Endo/Heme/Allergies: Does not bruise/bleed easily.  Psychiatric/Behavioral: Negative for  depression. The patient is not nervous/anxious and does not have insomnia.     As per HPI. Otherwise, a complete review of systems is negatve.  ONCOLOGY HISTORY: Oncology History   1. 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 CT-guided biopsy on 02/10/11 - insufficient for definite diagnosis. Rare group of atypical cells. Got chemotherapy with Paclitaxel/Carboplatin (started on 02/14/11, completed cycle 6 day 8 on 08/22/11). Also got concurrent radiation. 01/18/12 - Underwent Preoperative bronchoscopy with bronchoalveolar lavage right upper lobe. Right thoracoscopy with pleural biopsy and talc pleurodesis. Negative for malignancy. PET scan on 03/29/12 IMPRESSION: Multifocal pleural activity in the right chest suggesting pleural tumor spread. Activity is also noted in the right hilar region suggesting persistent tumor in this region. April 04, 2012 - VERISTRAT test reported as 'GOOD'. Started Tarceva on 05/17/12. July 2014 - declining performance status, unable to tolerate low dose Tarceva, was on home hospice which was discontinued since patient doing steady.     Lung cancer ([place])    Initial Diagnosis Lung cancer [facility])    PAST MEDICAL HISTORY: Past Medical History  Diagnosis Date  . Atrial fibrillation ([place])     Dr. Humphrey Rolls, cardiologist  . Anemia   . Hypothyroidism   . Lung cancer ([place])   . Hypertension   . Lung cancer ([place])   . Anemia     PAST SURGICAL HISTORY: Past Surgical History  Procedure Laterality Date  . Upper gastrointestinal endoscopy  02/24/14  . Esophagogastroduodenoscopy N/A 10/10/2014    Procedure: ESOPHAGOGASTRODUODENOSCOPY (EGD);  Surgeon: Manya Silvas, MD;  Location[facility] END[facility]COPY;  Service: Endoscopy;  Laterality: N/A;  . Colonoscopy N/A 10/11/2014    Procedure: COLONOSCOPY;  Surgeon: Manya Silvas, MD;   Location:  [facility] ENDOSCOPY;  Service: Endoscopy;  Laterality: N/A;    FAMILY HISTORY Family History  Problem Relation Age of Onset  . Cancer Mother     stomach  . Alzheimer's disease Father   . Varicose Veins Daughter     GYNECOLOGIC HISTORY:  No LMP recorded. Patient is postmenopausal.     ADVANCED DIRECTIVES:    HEALTH MAINTENANCE: Social History  Substance Use Topics  . Smoking status: Former Research scientist (life sciences)  . Smokeless tobacco: Never Used  . Alcohol Use: No     Colonoscopy:  PAP:  Bone density:  Lipid panel:  No Known Allergies  Current Outpatient Prescriptions  Medication Sig Dispense Refill  . atorvastatin (LIPITOR) 20 MG tablet Take 20 mg by mouth.    . gabapentin (NEURONTIN) 100 MG capsule Take 100 mg by mouth.    . liothyronine (CYTOMEL) 25 MCG tablet 25 mcg.    . potassium chloride (KLOR-CON) 20 MEQ packet Take 20 mEq by mouth.    . torsemide (DEMADEX) 10 MG tablet Take 15 mg by mouth.    Marland Kitchen albuterol (PROVENTIL HFA;VENTOLIN HFA) 108 (90 BASE) MCG/ACT inhaler Inhale into the lungs.    Marland Kitchen albuterol (PROVENTIL) (2.5 MG/3ML) 0.083% nebulizer solution Inhale 2.5 mg into the lungs.    Marland Kitchen amiodarone (PACERONE) 200 MG tablet Take 200 mg by mouth.    . benzonatate (TESSALON) 100 MG capsule Take 100 mg by mouth 3 (three) times daily as needed for cough.    . carvedilol (COREG) 3.125 MG tablet Take 3.125 mg by mouth 2 (two) times daily with a meal.    . chlorpheniramine (ALLERGY) 4 MG tablet Take 4 mg by mouth.    . Fluticasone-Salmeterol (ADVAIR) 250-50 MCG/DOSE AEPB Inhale into the lungs.    Marland Kitchen guaifenesin (ROBITUSSIN) 100 MG/5ML syrup Take 100 mg by mouth every 6 (six) hours as needed for cough.    . isosorbide mononitrate (IMDUR) 30 MG 24 hr tablet Take 30 mg by mouth daily.    . pantoprazole (PROTONIX) 40 MG tablet Take 40 mg by mouth.     No current facility-administered medications for this visit.   Facility-Administered Medications Ordered in Other Visits  Medication  Dose Route Frequency Provider Last Rate Last Dose  . 0.9 %  sodium chloride infusion   Intravenous Continuous Leia Alf, MD 20 mL/hr at 07/04/14 1150    . heparin lock flush 100 unit/mL  500 Units Intravenous Once Leia Alf, MD      . heparin lock flush 100 unit/mL  500 Units Intravenous Once Leia Alf, MD      . sodium chloride 0.9 % injection 10 mL  10 mL Intravenous PRN Leia Alf, MD      . sodium chloride 0.9 % injection 10 mL  10 mL Intravenous PRN Leia Alf, MD   10 mL at 07/04/14 1145    OBJECTIVE: BP 107/67 mmHg  Pulse 83  Temp(Src) 97.8 F (36.6 C) (Tympanic)  Resp 16  Wt 150 lb 9.2 oz (68.3 kg)   Body mass index is 33.78 kg/(m^2).    ECOG FS:2 - Symptomatic, <50% confined to bed  General: Well-developed, well-nourished, no acute distress. Eyes: Pink conjunctiva, anicteric sclera. HEENT: Normocephalic, moist mucous membranes, clear oropharnyx. Lungs: Clear to auscultation bilaterally. Heart: Regular rate and rhythm. No rubs, murmurs, or gallops. Abdomen: Soft, nontender, nondistended. No organomegaly noted, normoactive bowel sounds. Musculoskeletal: No edema, cyanosis, or clubbing. Neuro: Alert, answering all questions appropriately. Cranial nerves grossly intact. Skin: No rashes or petechiae noted.  Psych: Normal affect.    LAB RESULTS:  Appointment on 10/15/2014  Component Date Value Ref Range Status  . ABO/RH(D) 10/15/2014 AB POS   Final  . Antibody Screen 10/15/2014 NEG   Final  . Sample Expiration 10/15/2014 10/18/2014   Final  . WBC 10/15/2014 7.3  3.6 - 11.0 K/uL Final  . RBC 10/15/2014 3.86  3.80 - 5.20 MIL/uL Final  . Hemoglobin 10/15/2014 11.4* 12.0 - 16.0 g/dL Final  . HCT 10/15/2014 34.5* 35.0 - 47.0 % Final  . MCV 10/15/2014 89.4  80.0 - 100.0 fL Final  . MCH 10/15/2014 29.6  26.0 - 34.0 pg Final  . MCHC 10/15/2014 33.1  32.0 - 36.0 g/dL Final  . RDW 10/15/2014 16.7* 11.5 - 14.5 % Final  . Platelets 10/15/2014 224  150 - 440  K/uL Final  . Neutrophils Relative % 10/15/2014 86   Final  . Neutro Abs 10/15/2014 6.3  1.4 - 6.5 K/uL Final  . Lymphocytes Relative 10/15/2014 7   Final  . Lymphs Abs 10/15/2014 0.5* 1.0 - 3.6 K/uL Final  . Monocytes Relative 10/15/2014 6   Final  . Monocytes Absolute 10/15/2014 0.4  0.2 - 0.9 K/uL Final  . Eosinophils Relative 10/15/2014 1   Final  . Eosinophils Absolute 10/15/2014 0.1  0 - 0.7 K/uL Final  . Basophils Relative 10/15/2014 0   Final  . Basophils Absolute 10/15/2014 0.0  0 - 0.1 K/uL Final    STUDIES: No results found.  ASSESSMENT:  Non-small cell carcinoma of right upper lobe of lung. Acute on chronic anemia.  PLAN:   1. Lungs Ca. Most recent CT scan was performed 07/02/2014, reported as having some further volume loss but otherwise no obvious evidence to suggest progressive lung cancer. Patient has a chronic cough that is at baseline. No other issues at this time.  2. Acute on chronic anemia. Patient recently discharged from hospital following an acute GI bleed. EGD reported as unremarkable, colonoscopy report showed nonbleeding AVMs which were treated with argon laser. Hemoglobin has now returned within normal limits, 11.4 today.  We'll continue with routine follow-up in approximately 6 weeks for reevaluation of lab work and to establish with new primary oncologist.  Patient expressed understanding and was in agreement with this plan. She also understands that She can call clinic at any time with any questions, concerns, or complaints.   Dr. Oliva Bustard was available for consultation and review of plan of care for this patient.   Evlyn Kanner, NP   10/20/2014 12:10 PM

## 2014-10-16 ENCOUNTER — Inpatient Hospital Stay: Payer: Medicare Other

## 2014-10-22 ENCOUNTER — Encounter: Admission: RE | Payer: Self-pay | Source: Ambulatory Visit

## 2014-10-22 ENCOUNTER — Ambulatory Visit
Admission: RE | Admit: 2014-10-22 | Payer: Medicare Other | Source: Ambulatory Visit | Admitting: Unknown Physician Specialty

## 2014-10-22 SURGERY — ESOPHAGOGASTRODUODENOSCOPY (EGD) WITH PROPOFOL
Anesthesia: General

## 2014-11-17 ENCOUNTER — Other Ambulatory Visit: Payer: Self-pay | Admitting: Family Medicine

## 2014-11-17 NOTE — Telephone Encounter (Signed)
Routing to provider  

## 2014-11-25 ENCOUNTER — Other Ambulatory Visit: Payer: Self-pay | Admitting: *Deleted

## 2014-11-25 DIAGNOSIS — D509 Iron deficiency anemia, unspecified: Secondary | ICD-10-CM

## 2014-11-26 ENCOUNTER — Inpatient Hospital Stay: Payer: Medicare Other

## 2014-11-26 ENCOUNTER — Encounter: Payer: Self-pay | Admitting: *Deleted

## 2014-11-26 ENCOUNTER — Inpatient Hospital Stay (HOSPITAL_BASED_OUTPATIENT_CLINIC_OR_DEPARTMENT_OTHER): Payer: Medicare Other | Admitting: Internal Medicine

## 2014-11-26 ENCOUNTER — Inpatient Hospital Stay: Payer: Medicare Other | Attending: Internal Medicine

## 2014-11-26 VITALS — BP 115/59 | HR 94 | Temp 98.4°F | Wt 149.0 lb

## 2014-11-26 DIAGNOSIS — C801 Malignant (primary) neoplasm, unspecified: Secondary | ICD-10-CM

## 2014-11-26 DIAGNOSIS — Z85118 Personal history of other malignant neoplasm of bronchus and lung: Secondary | ICD-10-CM | POA: Insufficient documentation

## 2014-11-26 DIAGNOSIS — I10 Essential (primary) hypertension: Secondary | ICD-10-CM | POA: Insufficient documentation

## 2014-11-26 DIAGNOSIS — E039 Hypothyroidism, unspecified: Secondary | ICD-10-CM | POA: Insufficient documentation

## 2014-11-26 DIAGNOSIS — D509 Iron deficiency anemia, unspecified: Secondary | ICD-10-CM

## 2014-11-26 DIAGNOSIS — C3411 Malignant neoplasm of upper lobe, right bronchus or lung: Secondary | ICD-10-CM

## 2014-11-26 DIAGNOSIS — Z87891 Personal history of nicotine dependence: Secondary | ICD-10-CM | POA: Diagnosis not present

## 2014-11-26 DIAGNOSIS — I4891 Unspecified atrial fibrillation: Secondary | ICD-10-CM | POA: Diagnosis not present

## 2014-11-26 DIAGNOSIS — Z79899 Other long term (current) drug therapy: Secondary | ICD-10-CM | POA: Diagnosis not present

## 2014-11-26 LAB — CBC WITH DIFFERENTIAL/PLATELET
Basophils Absolute: 0 10*3/uL (ref 0–0.1)
Basophils Relative: 0 %
EOS PCT: 1 %
Eosinophils Absolute: 0 10*3/uL (ref 0–0.7)
HCT: 26.3 % — ABNORMAL LOW (ref 35.0–47.0)
Hemoglobin: 8.7 g/dL — ABNORMAL LOW (ref 12.0–16.0)
LYMPHS ABS: 0.7 10*3/uL — AB (ref 1.0–3.6)
LYMPHS PCT: 10 %
MCH: 29 pg (ref 26.0–34.0)
MCHC: 33.1 g/dL (ref 32.0–36.0)
MCV: 87.5 fL (ref 80.0–100.0)
MONO ABS: 0.6 10*3/uL (ref 0.2–0.9)
MONOS PCT: 9 %
Neutro Abs: 5.1 10*3/uL (ref 1.4–6.5)
Neutrophils Relative %: 80 %
PLATELETS: 290 10*3/uL (ref 150–440)
RBC: 3.01 MIL/uL — ABNORMAL LOW (ref 3.80–5.20)
RDW: 17.8 % — AB (ref 11.5–14.5)
WBC: 6.4 10*3/uL (ref 3.6–11.0)

## 2014-11-26 LAB — SAMPLE TO BLOOD BANK

## 2014-11-26 MED ORDER — HEPARIN SOD (PORK) LOCK FLUSH 100 UNIT/ML IV SOLN
500.0000 [IU] | Freq: Once | INTRAVENOUS | Status: AC
  Administered 2014-11-26: 500 [IU] via INTRAVENOUS
  Filled 2014-11-26: qty 5

## 2014-11-26 MED ORDER — SODIUM CHLORIDE 0.9 % IJ SOLN
10.0000 mL | Freq: Once | INTRAMUSCULAR | Status: AC
  Administered 2014-11-26: 10 mL via INTRAVENOUS
  Filled 2014-11-26: qty 10

## 2014-11-26 NOTE — Progress Notes (Signed)
Patient here today for follow up regarding IDA.  Last saw Magda Paganini, NP.  Patient has no complaints or concerns today.  Hx of lung cancer.

## 2014-11-26 NOTE — Progress Notes (Signed)
[facility] OFFICE PROGRESS NOTE  Patient Care Team: Arnetha Courser, MD as PCP - General (Family Medicine)   SUMMARY OF ONCOLOGIC HISTORY:  # JAN 2013- RECURRENT/METATSTATIC NON-SMALL CELL LUNG CA  [Right UL s/p Bronch; CT Bx- insuff] Carbo-taxol- RT   # JAN 2014- Pleural Bx- NEG/tacl pleurodesis; MARCH 2014- PET- Multifocal pleural activity ? Spread; April 2014- VERISTRAT- good; MAY 2014- Tarceva; July 2014- Tarceva Stopped sec to Declining PS- on hospice; OFF Hospice as pt is steady; CT June 2016- ? NED  # OCT 2016- ANEMIA- sec to GIB/ AV malformation- [s/p EGD/[place]]; NOV 2016- START PO iron again.   INTERVAL HISTORY:  This is my first interaction with the patient since I joined the practice September 2016. I reviewed the patient's prior charts/pertinent labs/imaging in detail; findings are summarized above.   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  Patient denies any unusual worsening cough or chest pain. She has chronic shortness of breath especially exertion. Hence she uses a wheelchair when she is outside the home.  Otherwise her appetite is good. She is not losing any weight. She denies any abnormal pains. No headaches or vision changes or double vision. She denies any blood in stools black stools. She is not on by mouth iron.   REVIEW OF SYSTEMS:  A complete 10 point review of system is done which is negative except mentioned above/history of present illness.   PAST MEDICAL HISTORY :  Past Medical History  Diagnosis Date  . Atrial fibrillation ([place])     Dr. Humphrey Rolls, cardiologist  . Anemia   . Hypothyroidism   . Lung cancer ([place])   . Hypertension   . Lung cancer ([place])   . Anemia     PAST SURGICAL HISTORY :   Past Surgical History   Procedure Laterality Date  . Upper gastrointestinal endoscopy  02/24/14  . Esophagogastroduodenoscopy N/A 10/10/2014    Procedure: ESOPHAGOGASTRODUODENOSCOPY (EGD);  Surgeon: Manya Silvas, MD;  Location: [facility]COPY;  Service: Endoscopy;  Laterality: N/A;  . Colonoscopy N/A 10/11/2014    Procedure: COLONOSCOPY;  Surgeon: Manya Silvas, MD;  Location: [facility] ENDOSCOPY;  Service: Endoscopy;  Laterality: N/A;    FAMILY HISTORY :   Family History  Problem Relation Age of Onset  . Stomach cancer Mother   . Alzheimer's disease Father   . Varicose Veins Daughter     SOCIAL HISTORY:   Social History  Substance Use Topics  . Smoking status: Former Research scientist (life sciences)  . Smokeless tobacco: Never Used  . Alcohol Use: No    ALLERGIES:  has No Known Allergies.  MEDICATIONS:  Current Outpatient Prescriptions  Medication Sig Dispense Refill  . albuterol (PROVENTIL HFA;VENTOLIN HFA) 108 (90 BASE) MCG/ACT inhaler Inhale into the lungs.    Marland Kitchen albuterol (PROVENTIL) (2.5 MG/3ML) 0.083% nebulizer solution Inhale 2.5 mg into the lungs.    Marland Kitchen amiodarone (PACERONE) 200 MG tablet Take 200 mg by mouth.    Marland Kitchen atorvastatin (LIPITOR) 20 MG tablet Take 20 mg by mouth.    . benzonatate (TESSALON) 100 MG capsule Take 100 mg by mouth 3 (three) times daily as needed for cough.    . carvedilol (COREG) 3.125 MG tablet Take 3.125 mg by mouth 2 (two) times daily with a meal.    . chlorpheniramine (ALLERGY)  4 MG tablet Take 4 mg by mouth.    . Fluticasone-Salmeterol (ADVAIR) 250-50 MCG/DOSE AEPB Inhale into the lungs.    . gabapentin (NEURONTIN) 100 MG capsule Take 100 mg by mouth.    . guaifenesin (ROBITUSSIN) 100 MG/5ML syrup Take 100 mg by mouth every 6 (six) hours as needed for cough.    . isosorbide mononitrate (IMDUR) 30 MG 24 hr tablet Take 30 mg by mouth daily.    Marland Kitchen liothyronine (CYTOMEL) 25 MCG tablet 25 mcg.    . pantoprazole (PROTONIX) 40 MG tablet Take 40 mg by mouth.    . potassium chloride (KLOR-CON) 20 MEQ  packet Take 20 mEq by mouth.    . torsemide (DEMADEX) 10 MG tablet Take 15 mg by mouth.    . torsemide (DEMADEX) 10 MG tablet Take 1 and 1/2 tablets by  mouth daily 135 tablet 0   No current facility-administered medications for this visit.   Facility-Administered Medications Ordered in Other Visits  Medication Dose Route Frequency Provider Last Rate Last Dose  . 0.9 %  sodium chloride infusion   Intravenous Continuous Leia Alf, MD 20 mL/hr at 07/04/14 1150    . heparin lock flush 100 unit/mL  500 Units Intravenous Once Leia Alf, MD      . heparin lock flush 100 unit/mL  500 Units Intravenous Once Leia Alf, MD      . heparin lock flush 100 unit/mL  500 Units Intravenous Once Evlyn Kanner, NP      . sodium chloride 0.9 % injection 10 mL  10 mL Intravenous PRN Leia Alf, MD      . sodium chloride 0.9 % injection 10 mL  10 mL Intravenous PRN Leia Alf, MD   10 mL at 07/04/14 1145  . sodium chloride 0.9 % injection 10 mL  10 mL Intravenous Once Evlyn Kanner, NP        PHYSICAL EXAMINATION: ECOG PERFORMANCE STATUS: 3 - Symptomatic, >50% confined to bed  BP 115/59 mmHg  Pulse 94  Temp(Src) 98.4 F (36.9 C) (Tympanic)  Wt 149 lb 0.5 oz (67.6 kg)  Filed Weights   11/26/14 1509  Weight: 149 lb 0.5 oz (67.6 kg)    GENERAL: Well-nourished well-developed; Alert, no distress and comfortable.   In a wheel chair.  EYES: no pallor or icterus OROPHARYNX: no thrush or ulceration; good dentition  NECK: supple, no masses felt LYMPH:  no palpable lymphadenopathy in the cervical, axillary or inguinal regions LUNGS: clear to auscultation and  No wheeze or crackles HEART/CVS: regular rate & rhythm and no murmurs; No lower extremity edema ABDOMEN:abdomen soft, non-tender and normal bowel sounds Musculoskeletal:no cyanosis of digits and no clubbing  PSYCH: alert & oriented x 3 with fluent speech NEURO: no focal motor/sensory deficits SKIN:  no rashes or significant  lesions  LABORATORY DATA:  I have reviewed the data as listed    Component Value Date/Time   NA 134* 10/11/2014 0422   NA 136 08/19/2014 1337   NA 135* 02/08/2014 0844   K 3.1* 10/11/2014 0422   K 4.0 02/08/2014 0844   CL 103 10/11/2014 0422   CL 105 02/08/2014 0844   CO2 25 10/11/2014 0422   CO2 21 02/08/2014 0844   GLUCOSE 87 10/11/2014 0422   GLUCOSE 136* 08/19/2014 1337   GLUCOSE 157* 02/08/2014 0844   BUN 20 10/11/2014 0422   BUN 32* 08/19/2014 1337   BUN 15 02/08/2014 0844   CREATININE 1.67* 10/11/2014 0422   CREATININE  1.36* 02/08/2014 0844   CALCIUM 7.9* 10/11/2014 0422   CALCIUM 8.8 02/08/2014 0844   PROT 7.7 09/16/2014 1110   PROT 7.9 12/06/2013 1453   ALBUMIN 2.3* 09/16/2014 1110   ALBUMIN 2.8* 12/06/2013 1453   AST 21 09/16/2014 1110   AST 16 12/06/2013 1453   ALT 9* 09/16/2014 1110   ALT 12* 12/06/2013 1453   ALKPHOS 95 09/16/2014 1110   ALKPHOS 82 12/06/2013 1453   BILITOT 0.9 09/16/2014 1110   BILITOT 0.3 12/06/2013 1453   GFRNONAA 30* 10/11/2014 0422   GFRNONAA 41* 02/08/2014 0844   GFRNONAA 32* 07/30/2013 1508   GFRAA 34* 10/11/2014 0422   GFRAA 49* 02/08/2014 0844   GFRAA 37* 07/30/2013 1508    No results found for: SPEP, UPEP  Lab Results  Component Value Date   WBC 6.4 11/26/2014   NEUTROABS 5.1 11/26/2014   HGB 8.7* 11/26/2014   HCT 26.3* 11/26/2014   MCV 87.5 11/26/2014   PLT 290 11/26/2014      Chemistry      Component Value Date/Time   NA 134* 10/11/2014 0422   NA 136 08/19/2014 1337   NA 135* 02/08/2014 0844   K 3.1* 10/11/2014 0422   K 4.0 02/08/2014 0844   CL 103 10/11/2014 0422   CL 105 02/08/2014 0844   CO2 25 10/11/2014 0422   CO2 21 02/08/2014 0844   BUN 20 10/11/2014 0422   BUN 32* 08/19/2014 1337   BUN 15 02/08/2014 0844   CREATININE 1.67* 10/11/2014 0422   CREATININE 1.36* 02/08/2014 0844      Component Value Date/Time   CALCIUM 7.9* 10/11/2014 0422   CALCIUM 8.8 02/08/2014 0844   ALKPHOS 95 09/16/2014  1110   ALKPHOS 82 12/06/2013 1453   AST 21 09/16/2014 1110   AST 16 12/06/2013 1453   ALT 9* 09/16/2014 1110   ALT 12* 12/06/2013 1453   BILITOT 0.9 09/16/2014 1110   BILITOT 0.3 12/06/2013 1453       RADIOGRAPHIC STUDIES: I have personally reviewed the radiological images as listed and agreed with the findings in the report. No results found.   ASSESSMENT & PLAN:   # LUNG CA- non-small cell lung stage IV ; most recent CAT scan  in June 2016 no convincing evidence of recurrence. Clinically patient seems to being stable. We'll plan to get a CT scan in approximately 3 months.   # ANEMIA- from iron deficiency/history of AV malformations/bleeding. Patient's hemoglobin today is 8.7. Recommend start taking by mouth iron 3 times a day. Patient has no intolerance.  # Check CBC ferritin in 6 weeks- based on that we'll plan IV iron if needed.  # CT scan chest in 3 months/CBC CMP ferritin at that time/follow-up with me.  No orders of the defined types were placed in this encounter.   All questions were answered. The patient knows to call the clinic with any problems, questions or concerns. No barriers to learning was detected.  25 minutes face-to-face with the patient and family. More than 50% of time spent on counseling of above medical care.   Cammie Sickle, MD 11/26/2014 3:28 PM

## 2014-12-06 ENCOUNTER — Other Ambulatory Visit: Payer: Self-pay | Admitting: Family Medicine

## 2014-12-06 NOTE — Telephone Encounter (Signed)
Normal TSH, free T3, free T4 in July; Rx approved

## 2014-12-22 ENCOUNTER — Other Ambulatory Visit: Payer: Self-pay | Admitting: Family Medicine

## 2014-12-22 NOTE — Telephone Encounter (Signed)
Routing to provider  

## 2014-12-22 NOTE — Telephone Encounter (Signed)
Last K+ reviewed, low (hospital encounter); future CMP already ordered by heme-onc

## 2015-01-02 ENCOUNTER — Ambulatory Visit (INDEPENDENT_AMBULATORY_CARE_PROVIDER_SITE_OTHER): Payer: Medicare Other | Admitting: Family Medicine

## 2015-01-02 ENCOUNTER — Encounter: Payer: Self-pay | Admitting: Family Medicine

## 2015-01-02 ENCOUNTER — Telehealth: Payer: Self-pay | Admitting: *Deleted

## 2015-01-02 VITALS — BP 103/64 | HR 83 | Temp 97.2°F | Wt 137.0 lb

## 2015-01-02 DIAGNOSIS — R634 Abnormal weight loss: Secondary | ICD-10-CM

## 2015-01-02 DIAGNOSIS — J209 Acute bronchitis, unspecified: Secondary | ICD-10-CM

## 2015-01-02 DIAGNOSIS — N183 Chronic kidney disease, stage 3 unspecified: Secondary | ICD-10-CM

## 2015-01-02 DIAGNOSIS — L02211 Cutaneous abscess of abdominal wall: Secondary | ICD-10-CM

## 2015-01-02 DIAGNOSIS — J42 Unspecified chronic bronchitis: Secondary | ICD-10-CM | POA: Diagnosis not present

## 2015-01-02 DIAGNOSIS — E038 Other specified hypothyroidism: Secondary | ICD-10-CM | POA: Diagnosis not present

## 2015-01-02 MED ORDER — DOXYCYCLINE HYCLATE 100 MG PO TABS: 100.0000 mg | ORAL_TABLET | Freq: Two times a day (BID) | ORAL | Status: DC

## 2015-01-02 NOTE — Progress Notes (Signed)
BP 103/64 mmHg  Pulse 83  Temp(Src) 97.2 F (36.2 C)  Wt 137 lb (62.143 kg)  SpO2 98%   Subjective:    Patient ID: Katrina Kramer, female    DOB: 10-01-1940, 74 y.o.   MRN: [id number]  HPI: Katrina Kramer is a 74 y.o. female  Chief Complaint  Patient presents with  . Cough    x 5-6 days, sometimes coughs up stuff. Lots of phelgm.  . Boils    she had 2 on her lower belly. One is healing and the other is draining.   Patient is here with her husband for an acute visit  She is coughing up yellow and white phlegm; did not cough much yesterday; no fevers; not coughing up any blood; she has known lung cancer, seen by oncologist  Appetite has not been good; her clothes are getting lose and she is losing weight; not eating like she should; has oxygen at night-time; has not been very short of breath  She has two boils at the lower point of her stomach, one had burst; some pus came out; she has not been to hospital or nursing home lately, but she was at rehab center in the spring; social get togethers  She had anemia; blood count dropped from October to November; she has been taking iron three times a day since then; she goes back to get counts rechecked January 12th and they'll go early in case she needs iron  Not feeling jittery or shaky; no extra hair loss; skin not dry; she has the hx of lung cancer, going to get scan in January with oncologist to see if any spread; no bone pain; no night sweats  Relevant past medical, surgical, family and social history reviewed and updated as indicated. Interim medical history since our last visit reviewed. Allergies and medications reviewed and updated.  Review of Systems Per HPI unless specifically indicated above     Objective:    BP 103/64 mmHg  Pulse 83  Temp(Src) 97.2 F (36.2 C)  Wt 137 lb (62.143 kg)  SpO2 98%  Wt Readings from Last 3 Encounters:  01/02/15 137 lb (62.143 kg)  11/26/14 149 lb 0.5 oz (67.6 kg)  10/15/14 150 lb 9.2 oz  (68.3 kg)    Physical Exam  Constitutional: No distress.  Elderly female seated in wheelchair, gait not assessed; appears frail and chronically ill  HENT:  Head: Normocephalic and atraumatic.  Mouth/Throat: No oropharyngeal exudate.  Eyes: EOM are normal. Right eye exhibits no discharge. Left eye exhibits no discharge. No scleral icterus.  Neck: No JVD present. No tracheal deviation present. No thyromegaly present.  Cardiovascular: Normal rate and regular rhythm.   Pulmonary/Chest: Effort normal and breath sounds normal. She has no wheezes. She has no rales.  Abdominal: She exhibits no distension.  Neurological: She is alert.  Skin: Skin is warm. Lesion (two boils on the lower abdomen, one unroofed, no active drainage; the other grape-sized, indurated, no focal drainage point) noted. No pallor.  Psychiatric: She has a normal mood and affect.   Results for orders placed or performed in visit on 11/26/14  CBC with Differential  Result Value Ref Range   WBC 6.4 3.6 - 11.0 K/uL   RBC 3.01 (L) 3.80 - 5.20 MIL/uL   Hemoglobin 8.7 (L) 12.0 - 16.0 g/dL   HCT 26.3 (L) 35.0 - 47.0 %   MCV 87.5 80.0 - 100.0 fL   MCH 29.0 26.0 - 34.0 pg   MCHC  33.1 32.0 - 36.0 g/dL   RDW 17.8 (H) 11.5 - 14.5 %   Platelets 290 150 - 440 K/uL   Neutrophils Relative % 80 %   Neutro Abs 5.1 1.4 - 6.5 K/uL   Lymphocytes Relative 10 %   Lymphs Abs 0.7 (L) 1.0 - 3.6 K/uL   Monocytes Relative 9 %   Monocytes Absolute 0.6 0.2 - 0.9 K/uL   Eosinophils Relative 1 %   Eosinophils Absolute 0.0 0 - 0.7 K/uL   Basophils Relative 0 %   Basophils Absolute 0.0 0 - 0.1 K/uL  Hold Tube- Blood Bank  Result Value Ref Range   Blood Bank Specimen SAMPLE AVAILABLE FOR TESTING    Sample Expiration 11/29/2014    Labs from October 11, 2014: BUN 6 - 20 mg/dL 20   Creatinine, Ser 0.44 - 1.00 mg/dL 1.67 (H)   Calcium 8.9 - 10.3 mg/dL 7.9 (L)   GFR calc non Af Amer >60 mL/min 30 (L)   GFR calc Af Amer >60 mL/min 34 (L)             Assessment & Plan:   Problem List Items Addressed This Visit      Respiratory   Chronic bronchitis with acute exacerbation ([place]) - Primary    Will treat with doxy; she is going to see lung cancer doctor in a few weeks and will be getting repeat scan to assess; reasons to seek medical attention reviewed; rest, hydration encouraged        Endocrine   Hypothyroidism    Check TSH, free T3, free T4 since she has lost 13 pounds in the last 2-1/2 months; patient declined having labs done today and wants to wait until she goes to see her oncologist in the next 1-2 weeks and will have the labs drawn there      Relevant Orders   T3, free   T4, free   TSH     Genitourinary   CKD (chronic kidney disease) stage 3, GFR 30-59 ml/min    Important for antimicrobial choice and dosing; chose doxy so no renal issues to consider        Other   Abnormal weight loss    Patient has lost significant weight, and with her hx of cancer, this is obviously concerning; she sees oncologist soon for repeat scans; I will check thyroid labs, and we'll hope this is over-replacement resulting in weight loss, which would be amenable to dose adjustment; close f/u with oncology       Other Visit Diagnoses    Abscess of abdominal wall        will use doxycyclin given GFR and the need to treat chronic bronchitis; warm compresses, reasons to seek medical attention reviewed       Follow up plan: No Follow-up on file.  An after-visit summary was printed and given to the patient at [facility].  Please see the patient instructions which may contain other information and recommendations beyond what is mentioned above in the assessment and plan.  Meds ordered this encounter  Medications  . Ferrous Sulfate 27 MG TABS    Sig: Take 27 mg by mouth 3 (three) times daily.  Marland Kitchen doxycycline (VIBRA-TABS) 100 MG tablet    Sig: Take 1 tablet (100 mg total) by mouth 2 (two) times daily.    Dispense:  20 tablet     Refill:  0

## 2015-01-02 NOTE — Assessment & Plan Note (Addendum)
Check TSH, free T3, free T4 since she has lost 13 pounds in the last 2-1/2 months; patient declined having labs done today and wants to wait until she goes to see her oncologist in the next 1-2 weeks and will have the labs drawn there

## 2015-01-02 NOTE — Telephone Encounter (Signed)
rcvd refill request for cough med.  I called [facility] and approved the rx with 2 refills per Dr. Grayland Ormond. Pharmacy will refill and call and let pt know rx ready for pick up

## 2015-01-02 NOTE — Patient Instructions (Signed)
Please do start the antibiotics Please do eat yogurt daily or take a probiotic daily for the next month or two We want to replace the healthy germs in the gut If you notice foul, watery diarrhea in the next two months, schedule an appointment RIGHT AWAY Please do use warm compresses over the boil on your stomach 10-15 minutes at a time, maybe 3-4 times a day for a few days If you symptoms worsen or you get really sick, go to the emergency room Have labs done to check your thyroid on January 15, 2015 Try vitamin C (orange juice if not diabetic or vitamin C tablets) and drink green tea to help your immune system during your illness Get plenty of rest and hydration

## 2015-01-05 DIAGNOSIS — J42 Unspecified chronic bronchitis: Principal | ICD-10-CM

## 2015-01-05 DIAGNOSIS — R634 Abnormal weight loss: Secondary | ICD-10-CM | POA: Insufficient documentation

## 2015-01-05 DIAGNOSIS — N183 Chronic kidney disease, stage 3 unspecified: Secondary | ICD-10-CM | POA: Insufficient documentation

## 2015-01-05 DIAGNOSIS — J209 Acute bronchitis, unspecified: Secondary | ICD-10-CM | POA: Insufficient documentation

## 2015-01-05 NOTE — Assessment & Plan Note (Signed)
Important for antimicrobial choice and dosing; chose doxy so no renal issues to consider

## 2015-01-05 NOTE — Assessment & Plan Note (Addendum)
Patient has lost significant weight, and with her hx of cancer, this is obviously concerning; she sees oncologist soon for repeat scans; I will check thyroid labs, and we'll hope this is over-replacement resulting in weight loss, which would be amenable to dose adjustment; close f/u with oncology

## 2015-01-05 NOTE — Assessment & Plan Note (Signed)
Will treat with doxy; she is going to see lung cancer doctor in a few weeks and will be getting repeat scan to assess; reasons to seek medical attention reviewed; rest, hydration encouraged

## 2015-01-06 ENCOUNTER — Other Ambulatory Visit: Payer: Self-pay | Admitting: Family Medicine

## 2015-01-13 ENCOUNTER — Other Ambulatory Visit: Payer: Self-pay | Admitting: *Deleted

## 2015-01-13 ENCOUNTER — Encounter: Payer: Self-pay | Admitting: *Deleted

## 2015-01-13 DIAGNOSIS — D509 Iron deficiency anemia, unspecified: Secondary | ICD-10-CM

## 2015-01-13 NOTE — Progress Notes (Signed)
RN Reviewed chart in preparation for 01/15/15 apt with md. Patient has not been scheduled for follow-up ct scan of chest with contrast as previously requested by md. msg sent to scheduling to get this scheduled as soon as possible-preferably before apt on 01/15/15. If this can not be r/s by this week, ok to r/s apts with md/lab to next week.

## 2015-01-15 ENCOUNTER — Inpatient Hospital Stay: Payer: Medicare Other

## 2015-01-15 ENCOUNTER — Telehealth: Payer: Self-pay | Admitting: Family Medicine

## 2015-01-15 ENCOUNTER — Inpatient Hospital Stay: Payer: Medicare Other | Attending: Internal Medicine | Admitting: Internal Medicine

## 2015-01-15 ENCOUNTER — Telehealth: Payer: Self-pay | Admitting: *Deleted

## 2015-01-15 ENCOUNTER — Other Ambulatory Visit: Payer: Self-pay | Admitting: Internal Medicine

## 2015-01-15 ENCOUNTER — Encounter: Payer: Self-pay | Admitting: Internal Medicine

## 2015-01-15 ENCOUNTER — Ambulatory Visit
Admission: RE | Admit: 2015-01-15 | Discharge: 2015-01-15 | Disposition: A | Discharge status: Home / Self Care | Payer: Medicare Other | Source: Ambulatory Visit | Attending: Internal Medicine | Admitting: Internal Medicine

## 2015-01-15 VITALS — BP 115/62 | HR 87 | Temp 97.9°F | Wt 134.7 lb

## 2015-01-15 DIAGNOSIS — R942 Abnormal results of pulmonary function studies: Secondary | ICD-10-CM

## 2015-01-15 DIAGNOSIS — Z932 Ileostomy status: Secondary | ICD-10-CM | POA: Insufficient documentation

## 2015-01-15 DIAGNOSIS — L739 Follicular disorder, unspecified: Secondary | ICD-10-CM | POA: Diagnosis not present

## 2015-01-15 DIAGNOSIS — D649 Anemia, unspecified: Secondary | ICD-10-CM | POA: Diagnosis not present

## 2015-01-15 DIAGNOSIS — I10 Essential (primary) hypertension: Secondary | ICD-10-CM | POA: Insufficient documentation

## 2015-01-15 DIAGNOSIS — C3431 Malignant neoplasm of lower lobe, right bronchus or lung: Secondary | ICD-10-CM

## 2015-01-15 DIAGNOSIS — Z452 Encounter for adjustment and management of vascular access device: Secondary | ICD-10-CM | POA: Insufficient documentation

## 2015-01-15 DIAGNOSIS — I4891 Unspecified atrial fibrillation: Secondary | ICD-10-CM | POA: Diagnosis not present

## 2015-01-15 DIAGNOSIS — Z79899 Other long term (current) drug therapy: Secondary | ICD-10-CM

## 2015-01-15 DIAGNOSIS — Z87891 Personal history of nicotine dependence: Secondary | ICD-10-CM | POA: Insufficient documentation

## 2015-01-15 DIAGNOSIS — C3411 Malignant neoplasm of upper lobe, right bronchus or lung: Secondary | ICD-10-CM | POA: Diagnosis not present

## 2015-01-15 DIAGNOSIS — R630 Anorexia: Secondary | ICD-10-CM | POA: Diagnosis not present

## 2015-01-15 DIAGNOSIS — E039 Hypothyroidism, unspecified: Secondary | ICD-10-CM | POA: Insufficient documentation

## 2015-01-15 DIAGNOSIS — Z95828 Presence of other vascular implants and grafts: Secondary | ICD-10-CM

## 2015-01-15 DIAGNOSIS — E038 Other specified hypothyroidism: Secondary | ICD-10-CM

## 2015-01-15 DIAGNOSIS — C3430 Malignant neoplasm of lower lobe, unspecified bronchus or lung: Secondary | ICD-10-CM

## 2015-01-15 DIAGNOSIS — J986 Disorders of diaphragm: Secondary | ICD-10-CM | POA: Insufficient documentation

## 2015-01-15 DIAGNOSIS — D509 Iron deficiency anemia, unspecified: Secondary | ICD-10-CM

## 2015-01-15 LAB — CBC WITH DIFFERENTIAL/PLATELET
BASOS PCT: 0 %
Basophils Absolute: 0 10*3/uL (ref 0–0.1)
EOS ABS: 0 10*3/uL (ref 0–0.7)
EOS PCT: 0 %
HCT: 28 % — ABNORMAL LOW (ref 35.0–47.0)
Hemoglobin: 9.1 g/dL — ABNORMAL LOW (ref 12.0–16.0)
Lymphocytes Relative: 10 %
Lymphs Abs: 0.6 10*3/uL — ABNORMAL LOW (ref 1.0–3.6)
MCH: 28.1 pg (ref 26.0–34.0)
MCHC: 32.5 g/dL (ref 32.0–36.0)
MCV: 86.4 fL (ref 80.0–100.0)
MONO ABS: 0.6 10*3/uL (ref 0.2–0.9)
MONOS PCT: 11 %
Neutro Abs: 4.3 10*3/uL (ref 1.4–6.5)
Neutrophils Relative %: 79 %
Platelets: 250 10*3/uL (ref 150–440)
RBC: 3.24 MIL/uL — ABNORMAL LOW (ref 3.80–5.20)
RDW: 17.5 % — AB (ref 11.5–14.5)
WBC: 5.5 10*3/uL (ref 3.6–11.0)

## 2015-01-15 LAB — FERRITIN: FERRITIN: 194 ng/mL (ref 11–307)

## 2015-01-15 LAB — SAMPLE TO BLOOD BANK

## 2015-01-15 LAB — POCT I-STAT CREATININE: Creatinine, Ser: 1.6 mg/dL — ABNORMAL HIGH (ref 0.44–1.00)

## 2015-01-15 MED ORDER — MEGESTROL ACETATE 625 MG/5ML PO SUSP: 625.0000 mg | Freq: Every day | ORAL | Status: DC

## 2015-01-15 MED ORDER — SODIUM CHLORIDE 0.9 % IJ SOLN
10.0000 mL | INTRAMUSCULAR | Status: DC | PRN
  Administered 2015-01-15: 10 mL via INTRAVENOUS
  Filled 2015-01-15: qty 10

## 2015-01-15 MED ORDER — HEPARIN SOD (PORK) LOCK FLUSH 100 UNIT/ML IV SOLN
500.0000 [IU] | Freq: Once | INTRAVENOUS | Status: AC
  Administered 2015-01-15: 500 [IU] via INTRAVENOUS
  Filled 2015-01-15: qty 5

## 2015-01-15 MED ORDER — ALPRAZOLAM 0.25 MG PO TABS: 0.2500 mg | ORAL_TABLET | Freq: Every evening | ORAL | Status: DC | PRN

## 2015-01-15 MED ORDER — ERLOTINIB HCL 100 MG PO TABS: 100.0000 mg | ORAL_TABLET | Freq: Every day | ORAL | Status: DC

## 2015-01-15 MED ORDER — ONDANSETRON HCL 4 MG PO TABS: 4.0000 mg | ORAL_TABLET | Freq: Three times a day (TID) | ORAL | Status: DC | PRN

## 2015-01-15 MED ORDER — DOXYCYCLINE HYCLATE 100 MG PO TABS: 100.0000 mg | ORAL_TABLET | Freq: Two times a day (BID) | ORAL | Status: DC

## 2015-01-15 MED ORDER — IOHEXOL 300 MG/ML  SOLN
60.0000 mL | Freq: Once | INTRAMUSCULAR | Status: AC | PRN
  Administered 2015-01-15: 60 mL via INTRAVENOUS

## 2015-01-15 NOTE — Progress Notes (Signed)
Patient here today for follow up Lung cancer and CT results. States she is doing "pretty well". Reports she has had a cough and a "boil on lower abdomen pelvic area". States she finished antiibotics that Dr. Enid Derry gave her. No redness or drainage noted.

## 2015-01-15 NOTE — Telephone Encounter (Signed)
Maggie from [facility] called to inform Dr. Rogue Bussing that there is a drug interaction with Amiodarone and the prescription he sent for Zofran. Dr. Rogue Bussing notified and orders given to change nausea medication from Zofran to Compazine 10 mg, 1 tab by mouth every 8 hours as needed for nausea. #60 with no refills.  Compazine prescription given to [facility]. Zofran prescription cancelled.

## 2015-01-15 NOTE — Telephone Encounter (Signed)
I called patient after reviewing her cancer note; lung cancer has progressed I am here for her, call if anything I can do

## 2015-01-15 NOTE — Telephone Encounter (Signed)
Husband calling stating that prescriptions did not get sent to pharmacy. Reviewed chart. RXs went to [facility]. RN called [facility]. Verbal order from MD to provide RX for zofran, megace and doxycyline as directed by md prescription. Order that was sent to [facility] was cnl via telephone call.  Pharmacy team states that patient's biologic drug (Tarceva) should be faxed to [facility] at [phone]). Phone: [phone]/ [phone].

## 2015-01-15 NOTE — Progress Notes (Signed)
[facility] OFFICE PROGRESS NOTE  Patient Care Team: Katrina Courser, MD as PCP - General (Family Medicine)   SUMMARY OF ONCOLOGIC HISTORY:  # JAN 2013- RECURRENT/METASTATIC NON-SMALL CELL LUNG CA  [Right UL s/p Bronch; CT Bx- insuff] Carbo-taxol- RT   # JAN 2014- Pleural Bx- NEG/tacl pleurodesis; MARCH 2014- PET- Multifocal pleural activity ? Spread; April 2014- VERISTRAT- good; MAY 2014- Tarceva; July 2014- Tarceva Stopped sec to Declining PS- on hospice; OFF Hospice as pt is steady; CT June 2016- ? 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  In the interim patient had a CT scan this morning.   Patient has noted worsening cough without hemoptysis in the last few  Weeks.  She has poor appetite.  She has lost about 10 pounds.  She was recently treated for a Boil in her suprapubic region with antibiotics  By her PCP.   As per thedaughter patient walks by herself at home;  However rests  Most of the time. She also complains of anxiety and difficulty sleeping at night. you  REVIEW OF SYSTEMS:  A complete 10 point review of system is done which is negative except mentioned above/history of present illness.   PAST MEDICAL HISTORY :  Past Medical History  Diagnosis Date  . Atrial fibrillation ([place])     Dr. Humphrey Kramer, cardiologist  . Anemia   . Hypothyroidism   . Lung cancer ([place])   . Hypertension   . Lung cancer ([place])   . Anemia     PAST SURGICAL HISTORY :   Past Surgical History  Procedure Laterality Date  . Upper gastrointestinal  endoscopy  02/24/14  . Esophagogastroduodenoscopy N/A 10/10/2014    Procedure: ESOPHAGOGASTRODUODENOSCOPY (EGD);  Surgeon: Katrina Silvas, MD;  Location: [facility] ENDOSCOPY;  Service: Endoscopy;  Laterality: N/A;  . Colonoscopy N/A 10/11/2014    Procedure: COLONOSCOPY;  Surgeon: Katrina Silvas, MD;  Location: [facility] ENDOSCOPY;  Service: Endoscopy;  Laterality: N/A;    FAMILY HISTORY :   Family History  Problem Relation Age of Onset  . Stomach cancer Mother   . Alzheimer's disease Father   . Varicose Veins Daughter     SOCIAL HISTORY:   Social History  Substance Use Topics  . Smoking status: Former Research scientist (life sciences)  . Smokeless tobacco: Never Used  . Alcohol Use: No    ALLERGIES:  has No Known Allergies.  MEDICATIONS:  Current Outpatient Prescriptions  Medication Sig Dispense Refill  . albuterol (PROVENTIL HFA;VENTOLIN HFA) 108 (90 BASE) MCG/ACT inhaler Inhale into the lungs.    Marland Kitchen albuterol (PROVENTIL) (2.5 MG/3ML) 0.083% nebulizer solution Inhale 2.5 mg into the lungs.    Marland Kitchen amiodarone (PACERONE) 200 MG tablet Take 200 mg by mouth daily.     Marland Kitchen atorvastatin (LIPITOR) 20 MG tablet Take 20 mg by mouth.    . benzonatate (TESSALON) 100 MG capsule Take 100 mg by mouth 3 (three) times daily as needed for cough.    . carvedilol (COREG) 3.125 MG tablet Take 3.125 mg by mouth 2 (two) times daily with a meal.    .  chlorpheniramine (ALLERGY) 4 MG tablet Take 4 mg by mouth. Reported on 01/02/2015    . Ferrous Sulfate 27 MG TABS Take 27 mg by mouth 3 (three) times daily.    . Fluticasone-Salmeterol (ADVAIR) 250-50 MCG/DOSE AEPB Inhale into the lungs. Reported on 01/02/2015    . gabapentin (NEURONTIN) 100 MG capsule Take 100 mg by mouth 2 (two) times daily.     Marland Kitchen guaifenesin (ROBITUSSIN) 100 MG/5ML syrup Take 100 mg by mouth every 6 (six) hours as needed for cough.    . isosorbide mononitrate (IMDUR) 30 MG 24 hr tablet Take 30 mg by mouth daily.    Marland Kitchen liothyronine (CYTOMEL) 25 MCG tablet Take 1 tablet by  mouth  daily 90 tablet 1  . pantoprazole (PROTONIX) 40 MG tablet Take 40 mg by mouth 2 (two) times daily.     . potassium chloride SA (K-DUR,KLOR-CON) 20 MEQ tablet Take 1 tablet by mouth two  times daily 180 tablet 0  . torsemide (DEMADEX) 10 MG tablet Take 1 and 1/2 tablets by  mouth daily 135 tablet 0  . doxycycline (VIBRA-TABS) 100 MG tablet Take 1 tablet (100 mg total) by mouth 2 (two) times daily. (Patient not taking: Reported on 01/15/2015) 20 tablet 0   No current facility-administered medications for this visit.   Facility-Administered Medications Ordered in Other Visits  Medication Dose Route Frequency Provider Last Rate Last Dose  . 0.9 %  sodium chloride infusion   Intravenous Continuous Katrina Alf, MD 20 mL/hr at 07/04/14 1150    . heparin lock flush 100 unit/mL  500 Units Intravenous Once Katrina Alf, MD      . heparin lock flush 100 unit/mL  500 Units Intravenous Once Katrina Alf, MD      . sodium chloride 0.9 % injection 10 mL  10 mL Intravenous PRN Katrina Alf, MD      . sodium chloride 0.9 % injection 10 mL  10 mL Intravenous PRN Katrina Alf, MD   10 mL at 07/04/14 1145    PHYSICAL EXAMINATION: ECOG PERFORMANCE STATUS: 3 - Symptomatic, >50% confined to bed  BP 115/62 mmHg  Pulse 87  Temp(Src) 97.9 F (36.6 C) (Tympanic)  Wt 134 lb 11.2 oz (61.1 kg)  Filed Weights   01/15/15 1107  Weight: 134 lb 11.2 oz (61.1 kg)    GENERAL:  Alert, no distress and comfortable.   In a wheel chair;  She is accompanied by daughter. EYES: no pallor or icterus OROPHARYNX: no thrush or ulceration; good dentition  NECK: supple, no masses felt LYMPH:  no palpable lymphadenopathy in the cervical, axillary or inguinal regions LUNGS: clear to auscultation and  No wheeze or crackles HEART/CVS: regular rate & rhythm and no murmurs; No lower extremity edema ABDOMEN:abdomen soft, non-tender and normal bowel sounds Musculoskeletal:no cyanosis of digits and no clubbing  PSYCH:  alert & oriented x 3 with fluent speech NEURO: no focal motor/sensory deficits SKIN:  no rashes or significant lesions  LABORATORY DATA:  I have reviewed the data as listed    Component Value Date/Time   NA 134* 10/11/2014 0422   NA 136 08/19/2014 1337   NA 135* 02/08/2014 0844   K 3.1* 10/11/2014 0422   K 4.0 02/08/2014 0844   CL 103 10/11/2014 0422   CL 105 02/08/2014 0844   CO2 25 10/11/2014 0422   CO2 21 02/08/2014 0844   GLUCOSE 87 10/11/2014 0422   GLUCOSE 136* 08/19/2014 1337   GLUCOSE 157* 02/08/2014 0844   BUN  20 10/11/2014 0422   BUN 32* 08/19/2014 1337   BUN 15 02/08/2014 0844   CREATININE 1.60* 01/15/2015 0942   CREATININE 1.36* 02/08/2014 0844   CALCIUM 7.9* 10/11/2014 0422   CALCIUM 8.8 02/08/2014 0844   PROT 7.7 09/16/2014 1110   PROT 7.9 12/06/2013 1453   ALBUMIN 2.3* 09/16/2014 1110   ALBUMIN 2.8* 12/06/2013 1453   AST 21 09/16/2014 1110   AST 16 12/06/2013 1453   ALT 9* 09/16/2014 1110   ALT 12* 12/06/2013 1453   ALKPHOS 95 09/16/2014 1110   ALKPHOS 82 12/06/2013 1453   BILITOT 0.9 09/16/2014 1110   BILITOT 0.3 12/06/2013 1453   GFRNONAA 30* 10/11/2014 0422   GFRNONAA 41* 02/08/2014 0844   GFRNONAA 32* 07/30/2013 1508   GFRAA 34* 10/11/2014 0422   GFRAA 49* 02/08/2014 0844   GFRAA 37* 07/30/2013 1508    No results found for: SPEP, UPEP  Lab Results  Component Value Date   WBC 5.5 01/15/2015   NEUTROABS 4.3 01/15/2015   HGB 9.1* 01/15/2015   HCT 28.0* 01/15/2015   MCV 86.4 01/15/2015   PLT 250 01/15/2015      Chemistry      Component Value Date/Time   NA 134* 10/11/2014 0422   NA 136 08/19/2014 1337   NA 135* 02/08/2014 0844   K 3.1* 10/11/2014 0422   K 4.0 02/08/2014 0844   CL 103 10/11/2014 0422   CL 105 02/08/2014 0844   CO2 25 10/11/2014 0422   CO2 21 02/08/2014 0844   BUN 20 10/11/2014 0422   BUN 32* 08/19/2014 1337   BUN 15 02/08/2014 0844   CREATININE 1.60* 01/15/2015 0942   CREATININE 1.36* 02/08/2014 0844       Component Value Date/Time   CALCIUM 7.9* 10/11/2014 0422   CALCIUM 8.8 02/08/2014 0844   ALKPHOS 95 09/16/2014 1110   ALKPHOS 82 12/06/2013 1453   AST 21 09/16/2014 1110   AST 16 12/06/2013 1453   ALT 9* 09/16/2014 1110   ALT 12* 12/06/2013 1453   BILITOT 0.9 09/16/2014 1110   BILITOT 0.3 12/06/2013 1453       RADIOGRAPHIC STUDIES: I have personally reviewed the radiological images as listed and agreed with the findings in the report. No results found.   ASSESSMENT & PLAN:   # LUNG CA- non-small cell lung stage IV;  CT scan done this morning is concerning for progression of disease the right lower lobe. I reviewed the images myself; reviewed the images with the patient and daughter. I also spoke to radiologist.  I discussed that her cancer is incurable;  And all treatments are palliative.  #  Given her poor performance status-  I'm concerned about side effects from chemotherapy.  Discussed about immunotherapy. Patient/family  Interested inTarceva  Patient had tried Tarceva in the past;  I recommend a trial of Tarceva again.  New prescription given for 100 mg once a day.  I again went over the potential side effects including diarrhea;  Possible skin rash.  Recommend Imodium for diarrhea.  Recommend hydrocortisone cream for skin rash.  # ANEMIA- from iron deficiency/history of AV malformations/bleeding. Patient's hemoglobin today is  9.1;  Ferritin is elevated.  No evidence of iron deficiency.   #  Drug interaction-  Recommend discontinuation of Protonix given its interaction with Tarceva.  I recommend Zantac instead  #  Folliculitis-  Recommend 10 more days of doxycycline. New prescription given.  #  Poor appetite weight loss- malignancy related;  Try Megace.  #  Overall the prognosis is poor.   40 minutes face-to-face with the patient and family. More than 50% of time spent on counseling of above medical care.  Patient will follow-up in approximately 4 weeks for  reevaluation.   Cammie Sickle, MD 01/15/2015 11:12 AM

## 2015-01-15 NOTE — Patient Instructions (Addendum)
STOP TAKING YOUR Protonix. This medication can interfere with your Tarceva. Instead of the Protonix, you may take over the counter Zantac 150 mg daily.        Megestrol tablets  This medication has been prescribed to help improve your appetite.   Megestrol oral suspension What is this medicine? MEGESTROL (me JES trol) suspension improves appetite and helps cause weight gain. It is used in conditions that cause significant loss of appetite and weight, such as AIDS. This medicine may be used for other purposes; ask your health care provider or pharmacist if you have questions. What should I tell my health care provider before I take this medicine? They need to know if you have any of these conditions: -adrenal gland problems -history of blood clots of the legs, lungs, or other parts of the body -diabetes -kidney disease -liver disease -an unusual or allergic reaction to megestrol, other medicines, foods, dyes, or preservatives -pregnant or trying to get pregnant -breast-feeding How should I use this medicine? Take this medicine by mouth. Follow the directions on the prescription label. Shake well before using. Use a specially marked spoon or container to measure your medicine. Ask your pharmacist if you do not have one. Household spoons are not accurate. Take your medicine at regular intervals. Do not take your medicine more often than directed. Do not stop taking except on your doctor's advice. Contact your pediatrician or health care provider regarding the use of this medicine in children. Special care may be needed. Overdosage: If you think you have taken too much of this medicine contact a poison control center or emergency room at once. NOTE: This medicine is only for you. Do not share this medicine with others. What if I miss a dose? If you miss or forget a single dose, continue with your next regularly scheduled dose on the next day. Do not take double or extra doses. What may  interact with this medicine? Do not take this medicine with any of the following medications: -dofetilide This medicine may also interact with the following medications: -carbamazepine -indinavir -phenobarbital -phenytoin -primidone -rifampin -warfarin This list may not describe all possible interactions. Give your health care provider a list of all the medicines, herbs, non-prescription drugs, or dietary supplements you use. Also tell them if you smoke, drink alcohol, or use illegal drugs. Some items may interact with your medicine. What should I watch for while using this medicine? Visit your doctor or health care professional for regular checks on your progress. It may take up to 3 weeks to first notice an improved appetite. It may take up to 12 weeks to notice weight gain. If you are a female of child-bearing age, use an effective method of birth control while you are taking this medicine. This medicine should not be used by females who are pregnant or breast-feeding. There is a potential for serious side effects to an unborn child or to an infant. Talk to your health care professional or pharmacist for more information. This medicine may affect blood sugar levels. If you have diabetes, check with your doctor or health care professional before you change your diet or the dose of your diabetic medicine. What side effects may I notice from receiving this medicine? Side effects that you should report to your doctor or health care professional as soon as possible: -allergic reactions like skin rash, itching or hives, swelling of the face, lips, or tongue -difficulty breathing or shortness of breath -chest pain -dizziness -fluid retention -increased blood  pressure -leg pain or swelling -nausea, vomiting -weakness Side effects that usually do not require medical attention (report to your doctor or health care professional if they continue or are bothersome): -breakthrough menstrual  bleeding -diarrhea -gas -hot flashes or flushing -sexual difficulties in men This list may not describe all possible side effects. Call your doctor for medical advice about side effects. You may report side effects to FDA at 1-800-FDA-1088. Where should I keep my medicine? Keep out of the reach of children. Store at room temperature between 15 and 25 degrees C (59 and 77 degrees F). Keep tightly closed. Protect from heat. Throw away any unused medicine after the expiration date. NOTE: This sheet is a summary. It may not cover all possible information. If you have questions about this medicine, talk to your doctor, pharmacist, or health care provider.    2016, Elsevier/Gold Standard. (2007-07-09 15:58:31)      Erlotinib tablets  (Tarceva)  This new drug must come from a speciality pharmacy!  What is this medicine? ERLOTINIB (er LOE ti nib) is a medicine that targets proteins in cancer cells and stops the cancer cells from growing. It is used to treat non-small cell lung cancer and pancreatic cancer. This medicine may be used for other purposes; ask your health care provider or pharmacist if you have questions. What should I tell my health care provider before I take this medicine? They need to know if you have any of these conditions: -liver disease -lung fibrosis -previous or ongoing radiation therapy -an unusual or allergic reaction to erlotinib, other medicines, foods, dyes, or preservatives -pregnant or trying to get pregnant -breast-feeding How should I use this medicine? Take erlotinib tablets by mouth. Follow the directions on the prescription label. Take this medicine on an empty stomach, at least 1 hour before or 2 hours after food. Do not take with food. Taking this drug with food may increase your chance of developing side effects. Do not take your medicine more often than directed. Talk to your pediatrician regarding the use of this medicine in children. Special care may  be needed. Overdosage: If you think you have taken too much of this medicine contact a poison control center or emergency room at once. NOTE: This medicine is only for you. Do not share this medicine with others. What if I miss a dose? Take your missed dose as soon as you remember. If it is almost time for your next dose, take only that one, and skip your missed dose. Do not take extra or double doses. What may interact with this medicine? -amiodarone -carbamazepine -ciprofloxacin -antiviral medicines for HIV or AIDS -bosentan -certain medicines for blood pressure -certain medicines for cholesterol like atorvastatin, lovastatin, and simvastatin -clarithromycin -erythromycin -grapefruit juice -medicines for depression -medicines for fungal infections like fluconazole, itraconazole, ketoconazole, or voriconazole -non-steroidal anti-inflammatory drugs (NSAIDs such as ibuprofen or naproxen) -omeprazole -phenobarbital -phenytoin -rifabutin -rifampin -rifapentine -St. John's wort -warfarin This list may not describe all possible interactions. Give your health care provider a list of all the medicines, herbs, non-prescription drugs, or dietary supplements you use. Also tell them if you smoke, drink alcohol, or use illegal drugs. Some items may interact with your medicine. What should I watch for while using this medicine? Visit your doctor for regular check ups. Report any side effects. Continue your course of treatment unless your doctor tells you to stop. You will need blood work done while you are taking this medicine. If you experience any of the following,  contact your health care provider: eye irritation; severe or continuing diarrhea, nausea, decreased appetite, or vomiting; or if your breathing gets worse or you develop shortness of breath or cough. If you smoke cigarettes, you should stop smoking. The effectiveness of this drug is reduced by cigarette smoking. If you stop smoking  during treatment, be sure to inform your doctor of this change. Do not become pregnant while taking this medicine. Women should inform their doctor if they wish to become pregnant or think they might be pregnant. There is a potential for serious side effects to an unborn child. Talk to your health care professional or pharmacist for more information. Do not breast-feed an infant while taking this medicine. What side effects may I notice from receiving this medicine? Side effects that you should report to your doctor or health care professional as soon as possible: -allergic reactions like skin rash, itching or hives, swelling of the face, lips, or tongue -breathing problems -eye irritation -eye pain -fever -mouth sores -severe or persistent diarrhea, nausea, vomiting, or loss of appetite -signs and symptoms of bleeding such as bloody or black, tarry stools; red or dark-brown urine; spitting up blood or brown material that looks like coffee grounds; red spots on the skin; unusual bruising or bleeding from the eye, gums, or nose Side effects that usually do not require medical attention (report to your doctor or health care professional if they continue or are bothersome): -acne -diarrhea -dry skin -itching -loss of appetite -nausea -weak or tired -weight loss This list may not describe all possible side effects. Call your doctor for medical advice about side effects. You may report side effects to FDA at 1-800-FDA-1088. Where should I keep my medicine? Keep out of the reach of children. Store at room temperature between 15 and 30 degrees C (59 and 86 degrees F). Throw away any unused medicine after the expiration date. NOTE: This sheet is a summary. It may not cover all possible information. If you have questions about this medicine, talk to your doctor, pharmacist, or health care provider.    2016, Elsevier/Gold Standard. (2014-02-26 21:59:50)

## 2015-01-16 ENCOUNTER — Telehealth: Payer: Self-pay | Admitting: *Deleted

## 2015-01-16 NOTE — Telephone Encounter (Signed)
Spoke with patient's daughter. Pt unable to afford the megace. Copay 258. Daughter asking for drug alternative to improve patient's appetite. RN spoke with Dr. Rogue Bussing. Other than Marinol, there is no other recommended drug substitute for appetite stimulation at this time. Daughter also inquring about the Tarceva. In the past, the patient had to have co-pay assistance for biologics. I explained that I will work with the patient pharmacy for copay assistance as needed.

## 2015-01-16 NOTE — Telephone Encounter (Signed)
Asking that Katrina Kramer call her regarding prescription form yesterday

## 2015-01-18 ENCOUNTER — Other Ambulatory Visit: Payer: Self-pay | Admitting: Family Medicine

## 2015-01-18 NOTE — Telephone Encounter (Signed)
Rx approved

## 2015-01-19 ENCOUNTER — Other Ambulatory Visit: Payer: Self-pay | Admitting: Internal Medicine

## 2015-01-19 ENCOUNTER — Telehealth: Payer: Self-pay | Admitting: *Deleted

## 2015-01-19 ENCOUNTER — Other Ambulatory Visit: Payer: Self-pay | Admitting: *Deleted

## 2015-01-19 DIAGNOSIS — C3411 Malignant neoplasm of upper lobe, right bronchus or lung: Secondary | ICD-10-CM

## 2015-01-19 NOTE — Telephone Encounter (Signed)
01/19/15- msg confirmation received from [place] [facility] (phone: [phone]) (fax: [phone]) for pt's Tarceva. Fax confirmed that new referral RX was received and in the process of process. This company will facilitate order and any PA requirements. Will be notified if patient's insurance company requires md involvement in PA process or if clarification is needed on the biologics referral and will also work with pt on any assistance for funding as the need arises.

## 2015-01-19 NOTE — Progress Notes (Signed)
Contacted patient per v/o Dr. Rogue Bussing. Spoke with both the patient and her husband. Explained that the pt needs to come on Thursday for lab/md/opdivo. MD would like pt to consider opdivo as a tx option until Tarceva.   Explained to patient the Dr. Rogue Bussing would like to offer her the option to have the opdivo tx. Since her Tarceva tx has been sent to the pharmacy but rx is still pending for processing. Pt gave verbal understanding and is agreeable for discuss this option with md.

## 2015-01-19 NOTE — Telephone Encounter (Signed)
Cannot afford $427 copay for Megace, Asking if something else can be ordered

## 2015-01-19 NOTE — Telephone Encounter (Signed)
Informed Katrina Kramer that there is no other alternative med which would be cheaper and that Katrina Kramer discussed this with his daughter last week

## 2015-01-19 NOTE — Telephone Encounter (Signed)
This concern was previously addressed last week when daughter called. Approximately 20 minutes was spent at that discuss this concern. At this time, Dr. Rogue Bussing does not have any other alternative drug recommendation. I explained to daughter last week that marinol would most likely cost a higher copay. Pt is not a candidate for steriods per md. Please let husband know. Thanks. Nira Conn, RN

## 2015-01-21 ENCOUNTER — Other Ambulatory Visit: Payer: Medicare Other

## 2015-01-21 DIAGNOSIS — E038 Other specified hypothyroidism: Secondary | ICD-10-CM

## 2015-01-22 ENCOUNTER — Telehealth: Payer: Self-pay | Admitting: Family Medicine

## 2015-01-22 ENCOUNTER — Inpatient Hospital Stay (HOSPITAL_BASED_OUTPATIENT_CLINIC_OR_DEPARTMENT_OTHER): Payer: Medicare Other | Admitting: Internal Medicine

## 2015-01-22 ENCOUNTER — Inpatient Hospital Stay: Payer: Medicare Other

## 2015-01-22 VITALS — BP 120/72 | HR 89 | Temp 97.0°F | Resp 20 | Ht <= 58 in | Wt 145.0 lb

## 2015-01-22 DIAGNOSIS — E876 Hypokalemia: Secondary | ICD-10-CM

## 2015-01-22 DIAGNOSIS — Z79899 Other long term (current) drug therapy: Secondary | ICD-10-CM

## 2015-01-22 DIAGNOSIS — E038 Other specified hypothyroidism: Secondary | ICD-10-CM

## 2015-01-22 DIAGNOSIS — R630 Anorexia: Secondary | ICD-10-CM

## 2015-01-22 DIAGNOSIS — D649 Anemia, unspecified: Secondary | ICD-10-CM

## 2015-01-22 DIAGNOSIS — C3411 Malignant neoplasm of upper lobe, right bronchus or lung: Secondary | ICD-10-CM

## 2015-01-22 DIAGNOSIS — R942 Abnormal results of pulmonary function studies: Secondary | ICD-10-CM

## 2015-01-22 DIAGNOSIS — C3431 Malignant neoplasm of lower lobe, right bronchus or lung: Secondary | ICD-10-CM

## 2015-01-22 DIAGNOSIS — L739 Follicular disorder, unspecified: Secondary | ICD-10-CM

## 2015-01-22 DIAGNOSIS — D509 Iron deficiency anemia, unspecified: Secondary | ICD-10-CM

## 2015-01-22 LAB — COMPREHENSIVE METABOLIC PANEL
ALBUMIN: 2.2 g/dL — AB (ref 3.5–5.0)
ALK PHOS: 132 U/L — AB (ref 38–126)
ALT: 16 U/L (ref 14–54)
ANION GAP: 9 (ref 5–15)
AST: 28 U/L (ref 15–41)
BILIRUBIN TOTAL: 0.8 mg/dL (ref 0.3–1.2)
BUN: 30 mg/dL — ABNORMAL HIGH (ref 6–20)
CALCIUM: 8.7 mg/dL — AB (ref 8.9–10.3)
CO2: 27 mmol/L (ref 22–32)
CREATININE: 1.67 mg/dL — AB (ref 0.44–1.00)
Chloride: 97 mmol/L — ABNORMAL LOW (ref 101–111)
GFR calc Af Amer: 34 mL/min — ABNORMAL LOW (ref >60)
GFR calc non Af Amer: 30 mL/min — ABNORMAL LOW (ref >60)
GLUCOSE: 119 mg/dL — AB (ref 65–99)
Potassium: 2.8 mmol/L — CL (ref 3.5–5.1)
Sodium: 133 mmol/L — ABNORMAL LOW (ref 135–145)
TOTAL PROTEIN: 8.1 g/dL (ref 6.5–8.1)

## 2015-01-22 LAB — T4, FREE: Free T4: 0.59 ng/dL — ABNORMAL LOW (ref 0.82–1.77)

## 2015-01-22 LAB — CBC WITH DIFFERENTIAL/PLATELET
BASOS PCT: 0 %
Basophils Absolute: 0 10*3/uL (ref 0–0.1)
Eosinophils Absolute: 0 10*3/uL (ref 0–0.7)
Eosinophils Relative: 1 %
HEMATOCRIT: 26 % — AB (ref 35.0–47.0)
HEMOGLOBIN: 8.5 g/dL — AB (ref 12.0–16.0)
LYMPHS ABS: 0.6 10*3/uL — AB (ref 1.0–3.6)
Lymphocytes Relative: 11 %
MCH: 28.1 pg (ref 26.0–34.0)
MCHC: 32.7 g/dL (ref 32.0–36.0)
MCV: 85.8 fL (ref 80.0–100.0)
MONOS PCT: 7 %
Monocytes Absolute: 0.4 10*3/uL (ref 0.2–0.9)
NEUTROS PCT: 81 %
Neutro Abs: 4.5 10*3/uL (ref 1.4–6.5)
Platelets: 246 10*3/uL (ref 150–440)
RBC: 3.03 MIL/uL — AB (ref 3.80–5.20)
RDW: 17.4 % — ABNORMAL HIGH (ref 11.5–14.5)
WBC: 5.6 10*3/uL (ref 3.6–11.0)

## 2015-01-22 LAB — T3, FREE: T3, Free: 3.8 pg/mL (ref 2.0–4.4)

## 2015-01-22 LAB — TSH: TSH: 0.054 u[IU]/mL — ABNORMAL LOW (ref 0.450–4.500)

## 2015-01-22 MED ORDER — LEVOTHYROXINE SODIUM 25 MCG PO TABS: 25.0000 ug | ORAL_TABLET | Freq: Every day | ORAL | Status: DC

## 2015-01-22 MED ORDER — HEPARIN SOD (PORK) LOCK FLUSH 100 UNIT/ML IV SOLN
500.0000 [IU] | Freq: Once | INTRAVENOUS | Status: AC | PRN
  Administered 2015-01-22: 500 [IU]
  Filled 2015-01-22: qty 5

## 2015-01-22 MED ORDER — SODIUM CHLORIDE 0.9 % IJ SOLN
10.0000 mL | INTRAMUSCULAR | Status: DC | PRN
  Administered 2015-01-22: 10 mL
  Filled 2015-01-22: qty 10

## 2015-01-22 MED ORDER — POTASSIUM CHLORIDE 2 MEQ/ML IV SOLN: 40.0000 meq | Freq: Once | INTRAVENOUS | Status: DC

## 2015-01-22 MED ORDER — SODIUM CHLORIDE 0.9 % IV SOLN
40.0000 meq | Freq: Once | INTRAVENOUS | Status: AC
  Administered 2015-01-22: 40 meq via INTRAVENOUS
  Filled 2015-01-22: qty 20

## 2015-01-22 MED ORDER — SODIUM CHLORIDE 0.9 % IV SOLN
Freq: Once | INTRAVENOUS | Status: DC
  Filled 2015-01-22: qty 250

## 2015-01-22 MED ORDER — SODIUM CHLORIDE 0.9 % IV SOLN
Freq: Once | INTRAVENOUS | Status: AC
  Administered 2015-01-22: 11:00:00 via INTRAVENOUS
  Filled 2015-01-22: qty 1000

## 2015-01-22 MED ORDER — NIVOLUMAB CHEMO INJECTION 100 MG/10ML
240.0000 mg | Freq: Once | INTRAVENOUS | Status: AC
  Administered 2015-01-22: 240 mg via INTRAVENOUS
  Filled 2015-01-22: qty 20

## 2015-01-22 NOTE — Patient Instructions (Addendum)
Please take oral potassium 20 meq 1 tablet three times a day for 1 week (01/22/15-01/29/15) and then take potassium 20 meq 1 tablet twice daily one week (01/30/15-02/06/15)    Hypokalemia Hypokalemia means that the amount of potassium in the blood is lower than normal.Potassium is a chemical, called an electrolyte, that helps regulate the amount of fluid in the body. It also stimulates muscle contraction and helps nerves function properly.Most of the body's potassium is inside of cells, and only a very small amount is in the blood. Because the amount in the blood is so small, minor changes can be life-threatening. CAUSES  Antibiotics.  Diarrhea or vomiting.  Using laxatives too much, which can cause diarrhea.  Chronic kidney disease.  Water pills (diuretics).  Eating disorders (bulimia).  Low magnesium level.  Sweating a lot. SIGNS AND SYMPTOMS  Weakness.  Constipation.  Fatigue.  Muscle cramps.  Mental confusion.  Skipped heartbeats or irregular heartbeat (palpitations).  Tingling or numbness. DIAGNOSIS  Your health care provider can diagnose hypokalemia with blood tests. In addition to checking your potassium level, your health care provider may also check other lab tests. TREATMENT Hypokalemia can be treated with potassium supplements taken by mouth or adjustments in your current medicines. If your potassium level is very low, you may need to get potassium through a vein (IV) and be monitored in the hospital. A diet high in potassium is also helpful. Foods high in potassium are:  Nuts, such as peanuts and pistachios.  Seeds, such as sunflower seeds and pumpkin seeds.  Peas, lentils, and lima beans.  Whole grain and bran cereals and breads.  Fresh fruit and vegetables, such as apricots, avocado, bananas, cantaloupe, kiwi, oranges, tomatoes, asparagus, and potatoes.  Orange and tomato juices.  Red meats.  Fruit yogurt. HOME CARE INSTRUCTIONS  Take all  medicines as prescribed by your health care provider.  Maintain a healthy diet by including nutritious food, such as fruits, vegetables, nuts, whole grains, and lean meats.  If you are taking a laxative, be sure to follow the directions on the label. SEEK MEDICAL CARE IF:  Your weakness gets worse.  You feel your heart pounding or racing.  You are vomiting or having diarrhea.  You are diabetic and having trouble keeping your blood glucose in the normal range. SEEK IMMEDIATE MEDICAL CARE IF:  You have chest pain, shortness of breath, or dizziness.  You are vomiting or having diarrhea for more than 2 days.  You faint. MAKE SURE YOU:   Understand these instructions.  Will watch your condition.  Will get help right away if you are not doing well or get worse.   This information is not intended to replace advice given to you by your health care provider. Make sure you discuss any questions you have with your health care provider.   Document Released: 12/20/2004 Document Revised: 01/10/2014 Document Reviewed: 06/22/2012 Elsevier Interactive Patient Education 2016 [place] injection What is this medicine? NIVOLUMAB (nye VOL ue mab) is a monoclonal antibody. It is used to treat melanoma, lung cancer, kidney cancer, and Hodgkin lymphoma. This medicine may be used for other purposes; ask your health care provider or pharmacist if you have questions. What should I tell my health care provider before I take this medicine? They need to know if you have any of these conditions: -diabetes -immune system problems -kidney disease -liver disease -lung disease -organ transplant -stomach or intestine  problems -thyroid disease -an unusual or allergic reaction to nivolumab, other medicines, foods, dyes, or preservatives -pregnant or trying to get pregnant -breast-feeding How should I use this medicine? This medicine is for infusion into a vein. It is  given by a health care professional in a hospital or clinic setting. A special MedGuide will be given to you before each treatment. Be sure to read this information carefully each time. Talk to your pediatrician regarding the use of this medicine in children. Special care may be needed. Overdosage: If you think you have taken too much of this medicine contact a poison control center or emergency room at once. NOTE: This medicine is only for you. Do not share this medicine with others. What if I miss a dose? It is important not to miss your dose. Call your doctor or health care professional if you are unable to keep an appointment. What may interact with this medicine? Interactions have not been studied. Give your health care provider a list of all the medicines, herbs, non-prescription drugs, or dietary supplements you use. Also tell them if you smoke, drink alcohol, or use illegal drugs. Some items may interact with your medicine. This list may not describe all possible interactions. Give your health care provider a list of all the medicines, herbs, non-prescription drugs, or dietary supplements you use. Also tell them if you smoke, drink alcohol, or use illegal drugs. Some items may interact with your medicine. What should I watch for while using this medicine? This drug may make you feel generally unwell. Continue your course of treatment even though you feel ill unless your doctor tells you to stop. You may need blood work done while you are taking this medicine. Do not become pregnant while taking this medicine or for 5 months after stopping it. Women should inform their doctor if they wish to become pregnant or think they might be pregnant. There is a potential for serious side effects to an unborn child. Talk to your health care professional or pharmacist for more information. Do not breast-feed an infant while taking this medicine. What side effects may I notice from receiving this  medicine? Side effects that you should report to your doctor or health care professional as soon as possible: -allergic reactions like skin rash, itching or hives, swelling of the face, lips, or tongue -black, tarry stools -blood in the urine -bloody or watery diarrhea -changes in vision -change in sex drive -changes in emotions or moods -chest pain -confusion -cough -decreased appetite -diarrhea -facial flushing -feeling faint or lightheaded -fever, chills -hair loss -hallucination, loss of contact with reality -headache -irritable -joint pain -loss of memory -muscle pain -muscle weakness -seizures -shortness of breath -signs and symptoms of high blood sugar such as dizziness; dry mouth; dry skin; fruity breath; nausea; stomach pain; increased hunger or thirst; increased urination -signs and symptoms of kidney injury like trouble passing urine or change in the amount of urine -signs and symptoms of liver injury like dark yellow or brown urine; general ill feeling or flu-like symptoms; light-colored stools; loss of appetite; nausea; right upper belly pain; unusually weak or tired; yellowing of the eyes or skin -stiff neck -swelling of the ankles, feet, hands -weight gain Side effects that usually do not require medical attention (report to your doctor or health care professional if they continue or are bothersome): -bone pain -constipation -tiredness -vomiting This list may not describe all possible side effects. Call your doctor for medical advice about side effects. You  may report side effects to FDA at 1-800-FDA-1088. Where should I keep my medicine? This drug is given in a hospital or clinic and will not be stored at home. NOTE: This sheet is a summary. It may not cover all possible information. If you have questions about this medicine, talk to your doctor, pharmacist, or health care provider.    2016, Elsevier/Gold Standard. (2014-05-21 10:03:42)

## 2015-01-22 NOTE — Assessment & Plan Note (Signed)
Switch to synthroid; recheck TSH, free T4, free T3 in 6 weeks

## 2015-01-22 NOTE — Telephone Encounter (Signed)
Let patient or husband know that she is getting too much of the current thyroid medicine We're going to switch types of thyroid medicine It may look confusing, because it is still "25" but the dose is actually quite different, less than before Take just one a day of the new medicine and recheck labs in 6 weeks

## 2015-01-22 NOTE — Progress Notes (Signed)
Pt agreeable for NEW opdivo today.  tarceva still pending with insurance.  Pt had a tsh level drawn with Dr. Sanda Klein on 01/21/15. Verbal order from md to cnl the tsh level for today. Informed Natalie, lab tech to cnl tsh order for 01/22/15.  Due to critical potassium today, pt will require IV potassium. Notified Blima Singer, RN in infusion of add on potasium. Nursing Hand off provided to [facility]

## 2015-01-22 NOTE — Progress Notes (Signed)
**Note Katrina-Identified via Obfuscation** Katrina Kramer OFFICE PROGRESS NOTE  Patient Care Team: Katrina Courser, MD as PCP - General (Family Medicine)   SUMMARY OF ONCOLOGIC HISTORY:  # JAN 2013- RECURRENT/METASTATIC NON-SMALL CELL LUNG CA  [Right UL s/p Bronch; CT Bx- insuff] Carbo-taxol- RT   # JAN 2014- Pleural Bx- NEG/tacl pleurodesis; MARCH 2014- PET- Multifocal pleural activity ? Spread; April 2014- VERISTRAT- good; MAY 2014- Tarceva; July 2014- Tarceva Stopped sec to Declining PS- on hospice; OFF Hospice as pt is steady; CT June 2016- ? 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  Patient continues to complain of cough with phlegm. No hemoptysis. Her appetite is slightly improved since starting the Megace. She has gained some weight.   As per thedaughter patient walks by herself at home;  However rests  Most of the time. She also complains of anxiety and difficulty sleeping at night. you  REVIEW OF SYSTEMS:  A complete 10 point review of system is done which is negative except mentioned above/history of present illness.   PAST MEDICAL HISTORY :  Past Medical History  Diagnosis Date  . Atrial fibrillation ([place])     Dr. Humphrey Rolls, cardiologist  . Anemia   . Hypothyroidism   . Lung cancer ([place])   . Hypertension   . Lung cancer ([place])   . Anemia     PAST SURGICAL HISTORY :   Past Surgical History  Procedure Laterality Date  . Upper gastrointestinal endoscopy  02/24/14  . Esophagogastroduodenoscopy N/A 10/10/2014    Procedure: ESOPHAGOGASTRODUODENOSCOPY (EGD);  Surgeon: Katrina Silvas, MD;  Location: [facility] ENDOSCOPY;  Service: Endoscopy;  Laterality:  N/A;  . Colonoscopy N/A 10/11/2014    Procedure: COLONOSCOPY;  Surgeon: Katrina Silvas, MD;  Location: [facility] ENDOSCOPY;  Service: Endoscopy;  Laterality: N/A;    FAMILY HISTORY :   Family History  Problem Relation Age of Onset  . Stomach cancer Mother   . Alzheimer's disease Father   . Varicose Veins Daughter     SOCIAL HISTORY:   Social History  Substance Use Topics  . Smoking status: Former Smoker -- 0.75 packs/day for 30 years    Types: Cigarettes  . Smokeless tobacco: Never Used  . Alcohol Use: No    ALLERGIES:  has No Known Allergies.  MEDICATIONS:  Current Outpatient Prescriptions  Medication Sig Dispense Refill  . albuterol (PROVENTIL HFA;VENTOLIN HFA) 108 (90 BASE) MCG/ACT inhaler Inhale into the lungs.    Marland Kitchen albuterol (PROVENTIL) (2.5 MG/3ML) 0.083% nebulizer solution Inhale 2.5 mg into the lungs.    . ALPRAZolam (XANAX) 0.25 MG tablet Take 1 tablet (0.25 mg total) by mouth at bedtime as needed for anxiety. 30 tablet 0  . amiodarone (PACERONE) 200 MG tablet Take 200 mg by mouth daily.     Marland Kitchen atorvastatin (LIPITOR) 20 MG tablet Take 20 mg by mouth.    . benzonatate (TESSALON) 100 MG capsule Take 100 mg by mouth 3 (three) times daily as needed for cough.    . carvedilol (COREG) 3.125 MG tablet Take 3.125 mg by mouth 2 (two) times daily with a meal.    . chlorpheniramine (ALLERGY) 4 MG tablet Take  4 mg by mouth. Reported on 01/02/2015    . doxycycline (VIBRA-TABS) 100 MG tablet Take 1 tablet (100 mg total) by mouth 2 (two) times daily. 20 tablet 0  . erlotinib (TARCEVA) 100 MG tablet Take 1 tablet (100 mg total) by mouth daily. Take on an empty stomach 1 hour before meals or 2 hours after 30 tablet 6  . Ferrous Sulfate 27 MG TABS Take 27 mg by mouth 3 (three) times daily.    . Fluticasone-Salmeterol (ADVAIR) 250-50 MCG/DOSE AEPB Inhale into the lungs. Reported on 01/02/2015    . gabapentin (NEURONTIN) 100 MG capsule Take 1 capsule by mouth two times daily 180 capsule 1  .  guaifenesin (ROBITUSSIN) 100 MG/5ML syrup Take 100 mg by mouth every 6 (six) hours as needed for cough.    . isosorbide mononitrate (IMDUR) 30 MG 24 hr tablet Take 30 mg by mouth daily.    Marland Kitchen levothyroxine (LEVOTHROID) 25 MCG tablet Take 1 tablet (25 mcg total) by mouth daily before breakfast. 30 tablet 1  . megestrol (MEGACE ES) 625 MG/5ML suspension Take 5 mLs (625 mg total) by mouth daily. 150 mL 3  . ondansetron (ZOFRAN) 4 MG tablet Take 1 tablet (4 mg total) by mouth every 8 (eight) hours as needed for nausea or vomiting. 40 tablet 0  . pantoprazole (PROTONIX) 40 MG tablet Take 40 mg by mouth 2 (two) times daily.     . potassium chloride SA (K-DUR,KLOR-CON) 20 MEQ tablet Take 1 tablet by mouth two  times daily 180 tablet 0  . torsemide (DEMADEX) 10 MG tablet Take 1 and 1/2 tablets by  mouth daily 135 tablet 0   No current facility-administered medications for this visit.   Facility-Administered Medications Ordered in Other Visits  Medication Dose Route Frequency Provider Last Rate Last Dose  . 0.9 %  sodium chloride infusion   Intravenous Continuous Leia Alf, MD 20 mL/hr at 07/04/14 1150    . heparin lock flush 100 unit/mL  500 Units Intravenous Once Leia Alf, MD      . heparin lock flush 100 unit/mL  500 Units Intravenous Once Leia Alf, MD      . sodium chloride 0.9 % injection 10 mL  10 mL Intravenous PRN Leia Alf, MD      . sodium chloride 0.9 % injection 10 mL  10 mL Intravenous PRN Leia Alf, MD   10 mL at 07/04/14 1145    PHYSICAL EXAMINATION: ECOG PERFORMANCE STATUS: 3 - Symptomatic, >50% confined to bed  BP 120/72 mmHg  Pulse 89  Temp(Src) 97 F (36.1 C) (Tympanic)  Resp 20  Ht '4\' 8"'$  (1.422 m)  Wt 145 lb (65.772 kg)  BMI 32.53 kg/m2  Filed Weights   01/22/15 1014  Weight: 145 lb (65.772 kg)    GENERAL:  Alert, no distress and comfortable.   In a wheel chair;  She is accompanied by daughter. EYES: no pallor or icterus OROPHARYNX: no  thrush or ulceration; good dentition  NECK: supple, no masses felt LYMPH:  no palpable lymphadenopathy in the cervical, axillary or inguinal regions LUNGS: Decreased breath sounds bilaterally at the bases. and  No wheeze or crackles HEART/CVS: regular rate & rhythm and no murmurs; No lower extremity edema ABDOMEN:abdomen soft, non-tender and normal bowel sounds Musculoskeletal:no cyanosis of digits and no clubbing  PSYCH: alert & oriented x 3 with fluent speech NEURO: no focal motor/sensory deficits SKIN:  no rashes or significant lesions  LABORATORY DATA:  I have reviewed the  data as listed    Component Value Date/Time   NA 133* 01/22/2015 0958   NA 136 08/19/2014 1337   NA 135* 02/08/2014 0844   K 2.8* 01/22/2015 0958   K 4.0 02/08/2014 0844   CL 97* 01/22/2015 0958   CL 105 02/08/2014 0844   CO2 27 01/22/2015 0958   CO2 21 02/08/2014 0844   GLUCOSE 119* 01/22/2015 0958   GLUCOSE 136* 08/19/2014 1337   GLUCOSE 157* 02/08/2014 0844   BUN 30* 01/22/2015 0958   BUN 32* 08/19/2014 1337   BUN 15 02/08/2014 0844   CREATININE 1.67* 01/22/2015 0958   CREATININE 1.36* 02/08/2014 0844   CALCIUM 8.7* 01/22/2015 0958   CALCIUM 8.8 02/08/2014 0844   PROT 8.1 01/22/2015 0958   PROT 7.9 12/06/2013 1453   ALBUMIN 2.2* 01/22/2015 0958   ALBUMIN 2.8* 12/06/2013 1453   AST 28 01/22/2015 0958   AST 16 12/06/2013 1453   ALT 16 01/22/2015 0958   ALT 12* 12/06/2013 1453   ALKPHOS 132* 01/22/2015 0958   ALKPHOS 82 12/06/2013 1453   BILITOT 0.8 01/22/2015 0958   BILITOT 0.3 12/06/2013 1453   GFRNONAA 30* 01/22/2015 0958   GFRNONAA 41* 02/08/2014 0844   GFRNONAA 32* 07/30/2013 1508   GFRAA 34* 01/22/2015 0958   GFRAA 49* 02/08/2014 0844   GFRAA 37* 07/30/2013 1508    No results found for: SPEP, UPEP  Lab Results  Component Value Date   WBC 5.6 01/22/2015   NEUTROABS 4.5 01/22/2015   HGB 8.5* 01/22/2015   HCT 26.0* 01/22/2015   MCV 85.8 01/22/2015   PLT 246 01/22/2015       Chemistry      Component Value Date/Time   NA 133* 01/22/2015 0958   NA 136 08/19/2014 1337   NA 135* 02/08/2014 0844   K 2.8* 01/22/2015 0958   K 4.0 02/08/2014 0844   CL 97* 01/22/2015 0958   CL 105 02/08/2014 0844   CO2 27 01/22/2015 0958   CO2 21 02/08/2014 0844   BUN 30* 01/22/2015 0958   BUN 32* 08/19/2014 1337   BUN 15 02/08/2014 0844   CREATININE 1.67* 01/22/2015 0958   CREATININE 1.36* 02/08/2014 0844      Component Value Date/Time   CALCIUM 8.7* 01/22/2015 0958   CALCIUM 8.8 02/08/2014 0844   ALKPHOS 132* 01/22/2015 0958   ALKPHOS 82 12/06/2013 1453   AST 28 01/22/2015 0958   AST 16 12/06/2013 1453   ALT 16 01/22/2015 0958   ALT 12* 12/06/2013 1453   BILITOT 0.8 01/22/2015 0958   BILITOT 0.3 12/06/2013 1453       RADIOGRAPHIC STUDIES: I have personally reviewed the radiological images as listed and agreed with the findings in the report. No results found.   ASSESSMENT & PLAN:   # LUNG CA- non-small cell lung stage IV;  CT scan concerning for progression of disease the right lower lobe; I would recommend starting palliative treatments with Opdivo every 2 weeks.   I again reviewed the treatment options including Tarceva/issues with insurance co-pay; patient would be at high risk of side effects from chemotherapy. I think immunotherapy therapy would be reasonable especially given her borderline performance status.  I again reviewed the potential side effects of chemotherapy including endocrine abnormalities skin rash and diarrhea pneumonitis etc. Unfortunately Overall the prognosis is poor.   # ANEMIA- from iron deficiency/history of AV malformations/bleeding. Patient's hemoglobin today is 8.5-likely secondary malignancy. Ferritin is elevated.  No evidence of iron deficiency.   #  Poor appetite weight loss- malignancy related;  Try Megace.  # Severe hypokalemia -Potassium 2.8; recommend 60 of potassium once a day for one week; 40 potassium once a day for one week.  Repeat labs in 2 weeks/next visit. We'll also give potassium 40 mg today through IV.   25 minutes face-to-face with the patient and family. More than 50% of time spent on counseling of above medical care.    # Patient follow-up with me in 2 weeks/labs Benjaman Kindler, MD 01/22/2015 10:32 AM

## 2015-01-22 NOTE — Progress Notes (Signed)
Kim from Lab called 1020 and reported critical potassium 2.8. readback and verified. Reported to Dr. B. At 1020am. Read back process performed with md

## 2015-01-22 NOTE — Telephone Encounter (Signed)
Called and left patient a voicemail asking for her to please return my call.  

## 2015-01-23 ENCOUNTER — Telehealth: Payer: Self-pay | Admitting: *Deleted

## 2015-01-23 NOTE — Telephone Encounter (Signed)
Called and spoke to patient's husband about new medication and to recheck labs in 6 weeks.

## 2015-01-23 NOTE — Telephone Encounter (Signed)
Husband calling wanting asst with pt's' megace.  Husband states that he had to be $258.00 and then the next time he had to pay 99.00 and was told that every month while she is on the medicine it will cost that much.  He wanted to see if Barnabas Lister could help them out.  I spoke to [place] and he would be willing if the med is under 100.00 for maybe a month or 2 at best.  The patient had used [facility] in the past and i can check with them..When I called [facility]. Caryl Pina says that she can run the rx on 2/8 because insurance will not pay for a dose because it is not due to be refilled.  She has my name and number and will run it 2/8 and call me.  I have then called husband back and told him what the process can be and that the depending on the cost Barnabas Lister could pay 1 month or 2 and that is it.  I told him I can let him know after pharmacy calls me back.

## 2015-02-03 ENCOUNTER — Other Ambulatory Visit: Payer: Self-pay | Admitting: *Deleted

## 2015-02-03 DIAGNOSIS — E038 Other specified hypothyroidism: Secondary | ICD-10-CM

## 2015-02-03 DIAGNOSIS — C3411 Malignant neoplasm of upper lobe, right bronchus or lung: Secondary | ICD-10-CM

## 2015-02-05 ENCOUNTER — Inpatient Hospital Stay: Payer: Medicare Other

## 2015-02-05 ENCOUNTER — Inpatient Hospital Stay: Payer: Medicare Other | Attending: Internal Medicine | Admitting: Internal Medicine

## 2015-02-05 VITALS — BP 167/69 | HR 90 | Temp 97.4°F | Resp 18 | Ht <= 58 in | Wt 143.4 lb

## 2015-02-05 DIAGNOSIS — N189 Chronic kidney disease, unspecified: Secondary | ICD-10-CM | POA: Diagnosis not present

## 2015-02-05 DIAGNOSIS — D509 Iron deficiency anemia, unspecified: Secondary | ICD-10-CM | POA: Diagnosis not present

## 2015-02-05 DIAGNOSIS — C3411 Malignant neoplasm of upper lobe, right bronchus or lung: Secondary | ICD-10-CM | POA: Diagnosis not present

## 2015-02-05 DIAGNOSIS — Z79899 Other long term (current) drug therapy: Secondary | ICD-10-CM | POA: Diagnosis not present

## 2015-02-05 DIAGNOSIS — I129 Hypertensive chronic kidney disease with stage 1 through stage 4 chronic kidney disease, or unspecified chronic kidney disease: Secondary | ICD-10-CM | POA: Insufficient documentation

## 2015-02-05 DIAGNOSIS — D631 Anemia in chronic kidney disease: Secondary | ICD-10-CM | POA: Diagnosis not present

## 2015-02-05 DIAGNOSIS — E038 Other specified hypothyroidism: Secondary | ICD-10-CM

## 2015-02-05 DIAGNOSIS — I4891 Unspecified atrial fibrillation: Secondary | ICD-10-CM | POA: Diagnosis not present

## 2015-02-05 DIAGNOSIS — E039 Hypothyroidism, unspecified: Secondary | ICD-10-CM | POA: Diagnosis not present

## 2015-02-05 DIAGNOSIS — C3430 Malignant neoplasm of lower lobe, unspecified bronchus or lung: Secondary | ICD-10-CM

## 2015-02-05 DIAGNOSIS — Z87891 Personal history of nicotine dependence: Secondary | ICD-10-CM | POA: Insufficient documentation

## 2015-02-05 LAB — CBC WITH DIFFERENTIAL/PLATELET
Basophils Absolute: 0 10*3/uL (ref 0–0.1)
Basophils Relative: 0 %
Eosinophils Absolute: 0.1 10*3/uL (ref 0–0.7)
Eosinophils Relative: 1 %
HCT: 25.4 % — ABNORMAL LOW (ref 35.0–47.0)
HEMOGLOBIN: 8.3 g/dL — AB (ref 12.0–16.0)
LYMPHS ABS: 0.9 10*3/uL — AB (ref 1.0–3.6)
LYMPHS PCT: 14 %
MCH: 28.7 pg (ref 26.0–34.0)
MCHC: 32.6 g/dL (ref 32.0–36.0)
MCV: 88.1 fL (ref 80.0–100.0)
Monocytes Absolute: 0.5 10*3/uL (ref 0.2–0.9)
Monocytes Relative: 7 %
NEUTROS PCT: 78 %
Neutro Abs: 4.9 10*3/uL (ref 1.4–6.5)
Platelets: 277 10*3/uL (ref 150–440)
RBC: 2.88 MIL/uL — AB (ref 3.80–5.20)
RDW: 19.7 % — ABNORMAL HIGH (ref 11.5–14.5)
WBC: 6.3 10*3/uL (ref 3.6–11.0)

## 2015-02-05 LAB — COMPREHENSIVE METABOLIC PANEL
ALK PHOS: 73 U/L (ref 38–126)
ALT: 16 U/L (ref 14–54)
AST: 19 U/L (ref 15–41)
Albumin: 2.6 g/dL — ABNORMAL LOW (ref 3.5–5.0)
Anion gap: 8 (ref 5–15)
BUN: 31 mg/dL — AB (ref 6–20)
CALCIUM: 8.3 mg/dL — AB (ref 8.9–10.3)
CO2: 21 mmol/L — AB (ref 22–32)
CREATININE: 1.35 mg/dL — AB (ref 0.44–1.00)
Chloride: 106 mmol/L (ref 101–111)
GFR, EST AFRICAN AMERICAN: 44 mL/min — AB (ref >60)
GFR, EST NON AFRICAN AMERICAN: 38 mL/min — AB (ref >60)
Glucose, Bld: 103 mg/dL — ABNORMAL HIGH (ref 65–99)
Potassium: 4 mmol/L (ref 3.5–5.1)
Sodium: 135 mmol/L (ref 135–145)
Total Bilirubin: 0.5 mg/dL (ref 0.3–1.2)
Total Protein: 7.8 g/dL (ref 6.5–8.1)

## 2015-02-05 LAB — FERRITIN: Ferritin: 108 ng/mL (ref 11–307)

## 2015-02-05 LAB — TSH: TSH: 13.517 u[IU]/mL — ABNORMAL HIGH (ref 0.350–4.500)

## 2015-02-05 MED ORDER — HEPARIN SOD (PORK) LOCK FLUSH 100 UNIT/ML IV SOLN
500.0000 [IU] | Freq: Once | INTRAVENOUS | Status: AC
  Administered 2015-02-05: 500 [IU] via INTRAVENOUS
  Filled 2015-02-05: qty 5

## 2015-02-05 MED ORDER — SODIUM CHLORIDE 0.9 % IV SOLN
240.0000 mg | Freq: Once | INTRAVENOUS | Status: AC
  Administered 2015-02-05: 240 mg via INTRAVENOUS
  Filled 2015-02-05: qty 20

## 2015-02-05 MED ORDER — SODIUM CHLORIDE 0.9 % IV SOLN
Freq: Once | INTRAVENOUS | Status: AC
  Administered 2015-02-05: 12:00:00 via INTRAVENOUS
  Filled 2015-02-05: qty 1000

## 2015-02-05 MED ORDER — SODIUM CHLORIDE 0.9% FLUSH
10.0000 mL | INTRAVENOUS | Status: DC | PRN
  Administered 2015-02-05: 10 mL via INTRAVENOUS
  Filled 2015-02-05: qty 10

## 2015-02-05 NOTE — Patient Instructions (Signed)
Fall Prevention in the Home   Falls can cause injuries. They can happen to people of all ages. There are many things you can do to make your home safe and to help prevent falls.   WHAT CAN I DO ON THE OUTSIDE OF MY HOME?  · Regularly fix the edges of walkways and driveways and fix any cracks.  · Remove anything that might make you trip as you walk through a door, such as a raised step or threshold.  · Trim any bushes or trees on the path to your home.  · Use bright outdoor lighting.  · Clear any walking paths of anything that might make someone trip, such as rocks or tools.  · Regularly check to see if handrails are loose or broken. Make sure that both sides of any steps have handrails.  · Any raised decks and porches should have guardrails on the edges.  · Have any leaves, snow, or ice cleared regularly.  · Use sand or salt on walking paths during winter.  · Clean up any spills in your garage right away. This includes oil or grease spills.  WHAT CAN I DO IN THE BATHROOM?   · Use night lights.  · Install grab bars by the toilet and in the tub and shower. Do not use towel bars as grab bars.  · Use non-skid mats or decals in the tub or shower.  · If you need to sit down in the shower, use a plastic, non-slip stool.  · Keep the floor dry. Clean up any water that spills on the floor as soon as it happens.  · Remove soap buildup in the tub or shower regularly.  · Attach bath mats securely with double-sided non-slip rug tape.  · Do not have throw rugs and other things on the floor that can make you trip.  WHAT CAN I DO IN THE BEDROOM?  · Use night lights.  · Make sure that you have a light by your bed that is easy to reach.  · Do not use any sheets or blankets that are too big for your bed. They should not hang down onto the floor.  · Have a firm chair that has side arms. You can use this for support while you get dressed.  · Do not have throw rugs and other things on the floor that can make you trip.  WHAT CAN I DO IN  THE KITCHEN?  · Clean up any spills right away.  · Avoid walking on wet floors.  · Keep items that you use a lot in easy-to-reach places.  · If you need to reach something above you, use a strong step stool that has a grab bar.  · Keep electrical cords out of the way.  · Do not use floor polish or wax that makes floors slippery. If you must use wax, use non-skid floor wax.  · Do not have throw rugs and other things on the floor that can make you trip.  WHAT CAN I DO WITH MY STAIRS?  · Do not leave any items on the stairs.  · Make sure that there are handrails on both sides of the stairs and use them. Fix handrails that are broken or loose. Make sure that handrails are as long as the stairways.  · Check any carpeting to make sure that it is firmly attached to the stairs. Fix any carpet that is loose or worn.  · Avoid having throw rugs at the top   or bottom of the stairs. If you do have throw rugs, attach them to the floor with carpet tape.  · Make sure that you have a light switch at the top of the stairs and the bottom of the stairs. If you do not have them, ask someone to add them for you.  WHAT ELSE CAN I DO TO HELP PREVENT FALLS?  · Wear shoes that:    Do not have high heels.    Have rubber bottoms.    Are comfortable and fit you well.    Are closed at the toe. Do not wear sandals.  · If you use a stepladder:    Make sure that it is fully opened. Do not climb a closed stepladder.    Make sure that both sides of the stepladder are locked into place.    Ask someone to hold it for you, if possible.  · Clearly mark and make sure that you can see:    Any grab bars or handrails.    First and last steps.    Where the edge of each step is.  · Use tools that help you move around (mobility aids) if they are needed. These include:    Canes.    Walkers.    Scooters.    Crutches.  · Turn on the lights when you go into a dark area. Replace any light bulbs as soon as they burn out.  · Set up your furniture so you have a clear  path. Avoid moving your furniture around.  · If any of your floors are uneven, fix them.  · If there are any pets around you, be aware of where they are.  · Review your medicines with your doctor. Some medicines can make you feel dizzy. This can increase your chance of falling.  Ask your doctor what other things that you can do to help prevent falls.     This information is not intended to replace advice given to you by your health care provider. Make sure you discuss any questions you have with your health care provider.     Document Released: 10/16/2008 Document Revised: 05/06/2014 Document Reviewed: 01/24/2014  Elsevier Interactive Patient Education ©2016 [employer]

## 2015-02-05 NOTE — Progress Notes (Signed)
[facility] OFFICE PROGRESS NOTE  Patient Care Team: Arnetha Courser, MD as PCP - General (Family Medicine)   SUMMARY OF ONCOLOGIC HISTORY:  # JAN 2013- RECURRENT/METASTATIC NON-SMALL CELL LUNG CA  [Right UL s/p Bronch; CT Bx- insuff] Carbo-taxol- RT   # JAN 2014- Pleural Bx- NEG/tacl pleurodesis; MARCH 2014- PET- Multifocal pleural activity ? Spread; April 2014- VERISTRAT- good; MAY 2014- Tarceva; July 2014- Tarceva Stopped sec to Declining PS- on hospice; OFF Hospice as pt is steady; CT June 2016- ? 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  After treatment as per the patient; she felt "very weak". However her symptoms improved. No focal deficits noted. No nausea no vomiting. No diarrhea.  Her breathing is "okay". Denies any chest pain. Cough is improved.  Her appetite is slightly improved since starting the Megace. She has gained some weight.   REVIEW OF SYSTEMS:  A complete 10 point review of system is done which is negative except mentioned above/history of present illness.   PAST MEDICAL HISTORY :  Past Medical History  Diagnosis Date  . Atrial fibrillation ([place])     Dr. Humphrey Rolls, cardiologist  . Anemia   . Hypothyroidism   . Lung cancer ([place])   . Hypertension   . Lung cancer ([place])   . Anemia     PAST SURGICAL HISTORY :   Past Surgical History  Procedure Laterality Date  . Upper gastrointestinal endoscopy  02/24/14  . Esophagogastroduodenoscopy N/A 10/10/2014    Procedure: ESOPHAGOGASTRODUODENOSCOPY (EGD);  Surgeon: Manya Silvas, MD;  Location: [facility] ENDOSCOPY;  Service: Endoscopy;  Laterality: N/A;  .  Colonoscopy N/A 10/11/2014    Procedure: COLONOSCOPY;  Surgeon: Manya Silvas, MD;  Location: [facility] ENDOSCOPY;  Service: Endoscopy;  Laterality: N/A;    FAMILY HISTORY :   Family History  Problem Relation Age of Onset  . Stomach cancer Mother   . Alzheimer's disease Father   . Varicose Veins Daughter     SOCIAL HISTORY:   Social History  Substance Use Topics  . Smoking status: Former Smoker -- 0.75 packs/day for 30 years    Types: Cigarettes  . Smokeless tobacco: Never Used  . Alcohol Use: No    ALLERGIES:  has No Known Allergies.  MEDICATIONS:  Current Outpatient Prescriptions  Medication Sig Dispense Refill  . albuterol (PROVENTIL HFA;VENTOLIN HFA) 108 (90 BASE) MCG/ACT inhaler Inhale into the lungs.    Marland Kitchen albuterol (PROVENTIL) (2.5 MG/3ML) 0.083% nebulizer solution Inhale 2.5 mg into the lungs.    . ALPRAZolam (XANAX) 0.25 MG tablet Take 1 tablet (0.25 mg total) by mouth at bedtime as needed for anxiety. 30 tablet 0  . amiodarone (PACERONE) 200 MG tablet Take 200 mg by mouth daily.     Marland Kitchen atorvastatin (LIPITOR) 20 MG tablet Take 20 mg by mouth.    . benzonatate (TESSALON) 100 MG capsule Take 100 mg by mouth 3 (three) times daily as needed for cough.    . carvedilol (COREG) 3.125 MG tablet Take 3.125 mg by mouth 2 (two) times daily with a meal.    . chlorpheniramine (ALLERGY) 4 MG tablet Take 4  mg by mouth. Reported on 01/02/2015    . erlotinib (TARCEVA) 100 MG tablet Take 1 tablet (100 mg total) by mouth daily. Take on an empty stomach 1 hour before meals or 2 hours after 30 tablet 6  . Ferrous Sulfate 27 MG TABS Take 27 mg by mouth 3 (three) times daily.    . Fluticasone-Salmeterol (ADVAIR) 250-50 MCG/DOSE AEPB Inhale into the lungs. Reported on 01/02/2015    . gabapentin (NEURONTIN) 100 MG capsule Take 1 capsule by mouth two times daily 180 capsule 1  . guaifenesin (ROBITUSSIN) 100 MG/5ML syrup Take 100 mg by mouth every 6 (six) hours as needed for cough.    . isosorbide  mononitrate (IMDUR) 30 MG 24 hr tablet Take 30 mg by mouth daily.    . megestrol (MEGACE ES) 625 MG/5ML suspension Take 5 mLs (625 mg total) by mouth daily. 150 mL 3  . ondansetron (ZOFRAN) 4 MG tablet Take 1 tablet (4 mg total) by mouth every 8 (eight) hours as needed for nausea or vomiting. 40 tablet 0  . pantoprazole (PROTONIX) 40 MG tablet Take 40 mg by mouth 2 (two) times daily.     . potassium chloride SA (K-DUR,KLOR-CON) 20 MEQ tablet Take 1 tablet by mouth two  times daily 180 tablet 0  . torsemide (DEMADEX) 10 MG tablet Take 1 and 1/2 tablets by  mouth daily 135 tablet 0  . levothyroxine (LEVOTHROID) 25 MCG tablet Take 1 tablet (25 mcg total) by mouth daily before breakfast. 30 tablet 1   No current facility-administered medications for this visit.   Facility-Administered Medications Ordered in Other Visits  Medication Dose Route Frequency Provider Last Rate Last Dose  . 0.9 %  sodium chloride infusion   Intravenous Continuous Leia Alf, MD 20 mL/hr at 07/04/14 1150    . heparin lock flush 100 unit/mL  500 Units Intravenous Once Leia Alf, MD      . heparin lock flush 100 unit/mL  500 Units Intravenous Once Leia Alf, MD      . heparin lock flush 100 unit/mL  500 Units Intravenous Once Cammie Sickle, MD      . sodium chloride 0.9 % injection 10 mL  10 mL Intravenous PRN Leia Alf, MD      . sodium chloride 0.9 % injection 10 mL  10 mL Intravenous PRN Leia Alf, MD   10 mL at 07/04/14 1145  . sodium chloride flush (NS) 0.9 % injection 10 mL  10 mL Intravenous PRN Cammie Sickle, MD        PHYSICAL EXAMINATION: ECOG PERFORMANCE STATUS: 3 - Symptomatic, >50% confined to bed  BP 167/69 mmHg  Pulse 90  Temp(Src) 97.4 F (36.3 C) (Tympanic)  Resp 18  Ht '4\' 10"'$  (1.473 m)  Wt 143 lb 6.6 oz (65.05 kg)  BMI 29.98 kg/m2  SpO2 99%  Filed Weights   02/05/15 1010  Weight: 143 lb 6.6 oz (65.05 kg)    GENERAL:  Alert, no distress and comfortable.  Thin built/moderately nourished  In a wheel chair;  She is accompanied by husband/ son.  EYES: no pallor or icterus OROPHARYNX: no thrush or ulceration; poor dentition  NECK: supple, no masses felt LYMPH:  no palpable lymphadenopathy in the cervical, axillary or inguinal regions LUNGS: Decreased breath sounds bilaterally at the bases. and  No wheeze or crackles HEART/CVS: regular rate & rhythm and no murmurs; No lower extremity edema ABDOMEN:abdomen soft, non-tender and normal bowel sounds Musculoskeletal:no cyanosis of digits and  no clubbing  PSYCH: alert & oriented x 3 with fluent speech NEURO: no focal motor/sensory deficits SKIN:  no rashes or significant lesions  LABORATORY DATA:  I have reviewed the data as listed    Component Value Date/Time   NA 135 02/05/2015 0940   NA 136 08/19/2014 1337   NA 135* 02/08/2014 0844   K 4.0 02/05/2015 0940   K 4.0 02/08/2014 0844   CL 106 02/05/2015 0940   CL 105 02/08/2014 0844   CO2 21* 02/05/2015 0940   CO2 21 02/08/2014 0844   GLUCOSE 103* 02/05/2015 0940   GLUCOSE 136* 08/19/2014 1337   GLUCOSE 157* 02/08/2014 0844   BUN 31* 02/05/2015 0940   BUN 32* 08/19/2014 1337   BUN 15 02/08/2014 0844   CREATININE 1.35* 02/05/2015 0940   CREATININE 1.36* 02/08/2014 0844   CALCIUM 8.3* 02/05/2015 0940   CALCIUM 8.8 02/08/2014 0844   PROT 7.8 02/05/2015 0940   PROT 7.9 12/06/2013 1453   ALBUMIN 2.6* 02/05/2015 0940   ALBUMIN 2.8* 12/06/2013 1453   AST 19 02/05/2015 0940   AST 16 12/06/2013 1453   ALT 16 02/05/2015 0940   ALT 12* 12/06/2013 1453   ALKPHOS 73 02/05/2015 0940   ALKPHOS 82 12/06/2013 1453   BILITOT 0.5 02/05/2015 0940   BILITOT 0.3 12/06/2013 1453   GFRNONAA 38* 02/05/2015 0940   GFRNONAA 41* 02/08/2014 0844   GFRNONAA 32* 07/30/2013 1508   GFRAA 44* 02/05/2015 0940   GFRAA 49* 02/08/2014 0844   GFRAA 37* 07/30/2013 1508    No results found for: SPEP, UPEP  Lab Results  Component Value Date   WBC 6.3 02/05/2015    NEUTROABS 4.9 02/05/2015   HGB 8.3* 02/05/2015   HCT 25.4* 02/05/2015   MCV 88.1 02/05/2015   PLT 277 02/05/2015      Chemistry      Component Value Date/Time   NA 135 02/05/2015 0940   NA 136 08/19/2014 1337   NA 135* 02/08/2014 0844   K 4.0 02/05/2015 0940   K 4.0 02/08/2014 0844   CL 106 02/05/2015 0940   CL 105 02/08/2014 0844   CO2 21* 02/05/2015 0940   CO2 21 02/08/2014 0844   BUN 31* 02/05/2015 0940   BUN 32* 08/19/2014 1337   BUN 15 02/08/2014 0844   CREATININE 1.35* 02/05/2015 0940   CREATININE 1.36* 02/08/2014 0844      Component Value Date/Time   CALCIUM 8.3* 02/05/2015 0940   CALCIUM 8.8 02/08/2014 0844   ALKPHOS 73 02/05/2015 0940   ALKPHOS 82 12/06/2013 1453   AST 19 02/05/2015 0940   AST 16 12/06/2013 1453   ALT 16 02/05/2015 0940   ALT 12* 12/06/2013 1453   BILITOT 0.5 02/05/2015 0940   BILITOT 0.3 12/06/2013 1453        ASSESSMENT & PLAN:   # LUNG CA- non-small cell lung stage IV; CT scan concerning for progression of disease the right lower lobe- on Opdivo every 2 weeks. Patient tolerated treatment cycle #1 fairly well 2 weeks ago.  # Proceed with cycle #2 today. CBC is adequate except for chronic anemia of hemoglobin 8.5./CMP shows chronic kidney disease creatinine 1.6. Hypokalemia resolved.  # History of hypothyroidism on Synthroid- recently elevated TSH/lower T4.- Likely iatrogenic. Her dose of Synthroid has been lower to  25  By Dr.Lada.  # ANEMIA- from iron deficiency/history of AV malformations/bleeding. Patient's hemoglobin today is 8.5-likely secondary malignancy/CKD. Ferritin is elevated.  No evidence of iron deficiency.   #  Again had a long discussion the patient and her family regarding the incurable nature of the disease. All the treatments palliated. We will  likely plan CT scan after 4 treatments.   # 25 minutes face-to-face with the patient discussing the above plan of care; more than 50% of time spent on prognosis/ natural  history; counseling and coordination.  # Patient follow-up with me in 2 weeks/labs Benjaman Kindler, MD 02/05/2015 10:39 AM

## 2015-02-05 NOTE — Progress Notes (Signed)
Pt had a recent fall at her residence approximately 2 weeks ago. She was able to lower herself to the floor gradually. "I felt myself going. I did not hurt myself or hit my head." Pt has not yet received the patient assistance application for the Tarceva. MD informed. At this time, MD will treat pt with opdivo. MD will cnl order for Tarceva. Pt and her family made aware.

## 2015-02-09 ENCOUNTER — Other Ambulatory Visit: Payer: Self-pay | Admitting: Family Medicine

## 2015-02-09 NOTE — Telephone Encounter (Signed)
K+ 4 days ago was fine, 4.0; Rx approved

## 2015-02-11 ENCOUNTER — Telehealth: Payer: Self-pay | Admitting: *Deleted

## 2015-02-11 NOTE — Telephone Encounter (Signed)
rcvd call from [place] from [facility] and she did run the [employer] and it will be copay for $95.00.  i spoke to Naylor and he is willing to pay the copay on [employer] for 2 months. Then Caryl Pina said she will have it ready by tom at 12 noon.  i then called pt's home and the husband is at work and I spoke to the pt.  I explained  To pt that jack will pay the copay for 2 months and it will be at [facility] which I have spoke to husband about already.  She will tell him this and he can pick it up tom. And she will tell him to call me if he has questions

## 2015-02-12 ENCOUNTER — Ambulatory Visit: Payer: Medicare Other | Admitting: Internal Medicine

## 2015-02-12 ENCOUNTER — Other Ambulatory Visit: Payer: Medicare Other

## 2015-02-19 ENCOUNTER — Inpatient Hospital Stay: Payer: Medicare Other

## 2015-02-19 ENCOUNTER — Telehealth: Payer: Self-pay | Admitting: *Deleted

## 2015-02-19 ENCOUNTER — Inpatient Hospital Stay (HOSPITAL_BASED_OUTPATIENT_CLINIC_OR_DEPARTMENT_OTHER): Payer: Medicare Other | Admitting: Internal Medicine

## 2015-02-19 ENCOUNTER — Other Ambulatory Visit: Payer: Self-pay | Admitting: Family Medicine

## 2015-02-19 VITALS — BP 139/70 | HR 90 | Temp 98.6°F | Ht <= 58 in | Wt 134.9 lb

## 2015-02-19 DIAGNOSIS — N189 Chronic kidney disease, unspecified: Secondary | ICD-10-CM | POA: Diagnosis not present

## 2015-02-19 DIAGNOSIS — D631 Anemia in chronic kidney disease: Secondary | ICD-10-CM

## 2015-02-19 DIAGNOSIS — C3411 Malignant neoplasm of upper lobe, right bronchus or lung: Secondary | ICD-10-CM

## 2015-02-19 DIAGNOSIS — Z79899 Other long term (current) drug therapy: Secondary | ICD-10-CM

## 2015-02-19 DIAGNOSIS — D509 Iron deficiency anemia, unspecified: Secondary | ICD-10-CM

## 2015-02-19 DIAGNOSIS — I129 Hypertensive chronic kidney disease with stage 1 through stage 4 chronic kidney disease, or unspecified chronic kidney disease: Secondary | ICD-10-CM | POA: Diagnosis not present

## 2015-02-19 LAB — COMPREHENSIVE METABOLIC PANEL
ALK PHOS: 86 U/L (ref 38–126)
ALT: 14 U/L (ref 14–54)
AST: 19 U/L (ref 15–41)
Albumin: 2.8 g/dL — ABNORMAL LOW (ref 3.5–5.0)
Anion gap: 10 (ref 5–15)
BUN: 40 mg/dL — AB (ref 6–20)
CHLORIDE: 102 mmol/L (ref 101–111)
CO2: 23 mmol/L (ref 22–32)
CREATININE: 1.98 mg/dL — AB (ref 0.44–1.00)
Calcium: 8.5 mg/dL — ABNORMAL LOW (ref 8.9–10.3)
GFR calc non Af Amer: 24 mL/min — ABNORMAL LOW (ref >60)
GFR, EST AFRICAN AMERICAN: 28 mL/min — AB (ref >60)
Glucose, Bld: 214 mg/dL — ABNORMAL HIGH (ref 65–99)
Potassium: 3.6 mmol/L (ref 3.5–5.1)
SODIUM: 135 mmol/L (ref 135–145)
Total Bilirubin: 0.7 mg/dL (ref 0.3–1.2)
Total Protein: 8.1 g/dL (ref 6.5–8.1)

## 2015-02-19 LAB — CBC WITH DIFFERENTIAL/PLATELET
BASOS ABS: 0 10*3/uL (ref 0–0.1)
Basophils Relative: 1 %
Eosinophils Absolute: 0.1 10*3/uL (ref 0–0.7)
Eosinophils Relative: 2 %
HCT: 27.4 % — ABNORMAL LOW (ref 35.0–47.0)
HEMOGLOBIN: 9.2 g/dL — AB (ref 12.0–16.0)
LYMPHS ABS: 0.7 10*3/uL — AB (ref 1.0–3.6)
LYMPHS PCT: 13 %
MCH: 30 pg (ref 26.0–34.0)
MCHC: 33.6 g/dL (ref 32.0–36.0)
MCV: 89.5 fL (ref 80.0–100.0)
Monocytes Absolute: 0.3 10*3/uL (ref 0.2–0.9)
Monocytes Relative: 7 %
NEUTROS PCT: 77 %
Neutro Abs: 3.8 10*3/uL (ref 1.4–6.5)
Platelets: 242 10*3/uL (ref 150–440)
RBC: 3.06 MIL/uL — AB (ref 3.80–5.20)
RDW: 22.9 % — ABNORMAL HIGH (ref 11.5–14.5)
WBC: 4.9 10*3/uL (ref 3.6–11.0)

## 2015-02-19 MED ORDER — HEPARIN SOD (PORK) LOCK FLUSH 100 UNIT/ML IV SOLN
500.0000 [IU] | Freq: Once | INTRAVENOUS | Status: AC
  Administered 2015-02-19: 500 [IU] via INTRAVENOUS

## 2015-02-19 MED ORDER — LEVOTHYROXINE SODIUM 50 MCG PO TABS: 50.0000 ug | ORAL_TABLET | Freq: Every day | ORAL | Status: DC

## 2015-02-19 MED ORDER — SODIUM CHLORIDE 0.9% FLUSH
10.0000 mL | INTRAVENOUS | Status: DC | PRN
  Filled 2015-02-19: qty 10

## 2015-02-19 MED ORDER — SODIUM CHLORIDE 0.9 % IV SOLN
240.0000 mg | Freq: Once | INTRAVENOUS | Status: AC
  Administered 2015-02-19: 240 mg via INTRAVENOUS
  Filled 2015-02-19: qty 24

## 2015-02-19 MED ORDER — HEPARIN SOD (PORK) LOCK FLUSH 100 UNIT/ML IV SOLN
INTRAVENOUS | Status: AC
  Filled 2015-02-19: qty 5

## 2015-02-19 MED ORDER — SODIUM CHLORIDE 0.9 % IV SOLN
Freq: Once | INTRAVENOUS | Status: AC
  Administered 2015-02-19: 10:00:00 via INTRAVENOUS
  Filled 2015-02-19: qty 1000

## 2015-02-19 NOTE — Telephone Encounter (Signed)
Contacted patient and spoke with her husband. I encouraged po fluid intake. I explained that her labs today demonstrated that she was slightly dehydrated. Teach back process performed with patient's husband

## 2015-02-19 NOTE — Telephone Encounter (Signed)
Called and left patient a voicemail asking for patient to please return my call.

## 2015-02-19 NOTE — Progress Notes (Signed)
[facility] OFFICE PROGRESS NOTE  Patient Care Team: Arnetha Courser, MD as PCP - General (Family Medicine)   SUMMARY OF ONCOLOGIC HISTORY:  # JAN 2013- RECURRENT/METASTATIC NON-SMALL CELL LUNG CA  [Right UL s/p Bronch; CT Bx- insuff] Carbo-taxol- RT   # JAN 2014- Pleural Bx- NEG/tacl pleurodesis; MARCH 2014- PET- Multifocal pleural activity ? Spread; April 2014- VERISTRAT- good; MAY 2014- Tarceva; July 2014- Tarceva Stopped sec to Declining PS- on hospice; OFF Hospice as pt is steady; CT June 2016- ? NED  # JAN 2017- PROGRESSION of Right Lung Ca [CT- Jan 2017]- Start OPDIVO q 2W  # OCT 2016- ANEMIA- sec to GIB/ AV malformation- [s/p EGD/[place]]; NOV 2016- START PO iron again; CKD [creatinine- 1.6]  INTERVAL HISTORY:  [name] with above history of recurrent/metastatic non-small cell lung cancer is here status post cycle #2 of Opdivo approximately 2 weeks ago.  Patient denies any significant nausea vomiting or diarrhea. Her cough is improving. Her appetite is improving since she has been on Megace. She has lost about 9 pounds.   Denies any unusual headaches or double vision. No skin rash or itch.   REVIEW OF SYSTEMS:  A complete 10 point review of system is done which is negative except mentioned above/history of present illness.   PAST MEDICAL HISTORY :  Past Medical History  Diagnosis Date  . Atrial fibrillation ([place])     Dr. Humphrey Rolls, cardiologist  . Anemia   . Hypothyroidism   . Lung cancer ([place])   . Hypertension   . Lung cancer ([place])   . Anemia     PAST SURGICAL HISTORY :   Past Surgical History  Procedure Laterality Date  . Upper gastrointestinal endoscopy  02/24/14  . Esophagogastroduodenoscopy N/A 10/10/2014    Procedure: ESOPHAGOGASTRODUODENOSCOPY (EGD);  Surgeon: Manya Silvas, MD;  Location: [facility] ENDOSCOPY;  Service: Endoscopy;  Laterality: N/A;  . Colonoscopy N/A 10/11/2014    Procedure: COLONOSCOPY;  Surgeon:  Manya Silvas, MD;  Location: [facility] ENDOSCOPY;  Service: Endoscopy;  Laterality: N/A;    FAMILY HISTORY :   Family History  Problem Relation Age of Onset  . Stomach cancer Mother   . Alzheimer's disease Father   . Varicose Veins Daughter     SOCIAL HISTORY:   Social History  Substance Use Topics  . Smoking status: Former Smoker -- 0.75 packs/day for 30 years    Types: Cigarettes  . Smokeless tobacco: Never Used  . Alcohol Use: No    ALLERGIES:  has No Known Allergies.  MEDICATIONS:  Current Outpatient Prescriptions  Medication Sig Dispense Refill  . albuterol (PROVENTIL HFA;VENTOLIN HFA) 108 (90 BASE) MCG/ACT inhaler Inhale into the lungs.    Marland Kitchen albuterol (PROVENTIL) (2.5 MG/3ML) 0.083% nebulizer solution Inhale 2.5 mg into the lungs.    . ALPRAZolam (XANAX) 0.25 MG tablet Take 1 tablet (0.25 mg total) by mouth at bedtime as needed for anxiety. 30 tablet 0  . amiodarone (PACERONE) 200 MG tablet Take 200 mg by mouth daily.     Marland Kitchen atorvastatin (LIPITOR) 20 MG tablet Take 20 mg by mouth.    . benzonatate (TESSALON) 100 MG capsule Take 100 mg by mouth 3 (three) times daily as needed for cough.    . carvedilol (COREG) 3.125 MG tablet Take 3.125 mg by mouth 2 (two) times daily with a meal.    . chlorpheniramine (ALLERGY) 4 MG tablet Take 4 mg by mouth. Reported on 01/02/2015    .  erlotinib (TARCEVA) 100 MG tablet Take 1 tablet (100 mg total) by mouth daily. Take on an empty stomach 1 hour before meals or 2 hours after 30 tablet 6  . Ferrous Sulfate 27 MG TABS Take 27 mg by mouth 3 (three) times daily.    . Fluticasone-Salmeterol (ADVAIR) 250-50 MCG/DOSE AEPB Inhale into the lungs. Reported on 01/02/2015    . gabapentin (NEURONTIN) 100 MG capsule Take 1 capsule by mouth two times daily 180 capsule 1  . guaifenesin (ROBITUSSIN) 100 MG/5ML syrup Take 100 mg by mouth every 6 (six) hours as needed for cough.    . isosorbide mononitrate (IMDUR) 30 MG 24 hr tablet Take 30 mg by mouth daily.     Marland Kitchen levothyroxine (SYNTHROID, LEVOTHROID) 50 MCG tablet Take 1 tablet (50 mcg total) by mouth daily. 90 tablet 0  . megestrol (MEGACE ES) 625 MG/5ML suspension Take 5 mLs (625 mg total) by mouth daily. 150 mL 3  . ondansetron (ZOFRAN) 4 MG tablet Take 1 tablet (4 mg total) by mouth every 8 (eight) hours as needed for nausea or vomiting. 40 tablet 0  . pantoprazole (PROTONIX) 40 MG tablet Take 40 mg by mouth 2 (two) times daily.     . potassium chloride SA (K-DUR,KLOR-CON) 20 MEQ tablet Take 1 tablet by mouth two  times daily 180 tablet 0  . torsemide (DEMADEX) 10 MG tablet Take 1 and 1/2 tablets by  mouth daily 135 tablet 0   No current facility-administered medications for this visit.   Facility-Administered Medications Ordered in Other Visits  Medication Dose Route Frequency Provider Last Rate Last Dose  . 0.9 %  sodium chloride infusion   Intravenous Continuous Leia Alf, MD 20 mL/hr at 07/04/14 1150    . heparin lock flush 100 unit/mL  500 Units Intravenous Once Leia Alf, MD      . heparin lock flush 100 unit/mL  500 Units Intravenous Once Leia Alf, MD      . sodium chloride 0.9 % injection 10 mL  10 mL Intravenous PRN Leia Alf, MD      . sodium chloride 0.9 % injection 10 mL  10 mL Intravenous PRN Leia Alf, MD   10 mL at 07/04/14 1145    PHYSICAL EXAMINATION: ECOG PERFORMANCE STATUS: 3 - Symptomatic, >50% confined to bed  BP 139/70 mmHg  Pulse 90  Temp(Src) 98.6 F (37 C) (Tympanic)  Ht '4\' 10"'$  (1.473 m)  Wt 134 lb 14.7 oz (61.2 kg)  BMI 28.21 kg/m2  Filed Weights   02/19/15 0908  Weight: 134 lb 14.7 oz (61.2 kg)    GENERAL:  Alert, no distress and comfortable. Thin built/moderately nourished  In a wheel chair;  She is accompanied by husband/ son.  EYES: no pallor or icterus OROPHARYNX: no thrush or ulceration; poor dentition  NECK: supple, no masses felt LYMPH:  no palpable lymphadenopathy in the cervical, axillary or inguinal regions LUNGS:  Decreased breath sounds bilaterally at the bases. and  No wheeze or crackles HEART/CVS: regular rate & rhythm and no murmurs; No lower extremity edema ABDOMEN:abdomen soft, non-tender and normal bowel sounds Musculoskeletal:no cyanosis of digits and no clubbing  PSYCH: alert & oriented x 3 with fluent speech NEURO: no focal motor/sensory deficits SKIN:  no rashes or significant lesions  LABORATORY DATA:  I have reviewed the data as listed    Component Value Date/Time   NA 135 02/05/2015 0940   NA 136 08/19/2014 1337   NA 135* 02/08/2014 4431  K 4.0 02/05/2015 0940   K 4.0 02/08/2014 0844   CL 106 02/05/2015 0940   CL 105 02/08/2014 0844   CO2 21* 02/05/2015 0940   CO2 21 02/08/2014 0844   GLUCOSE 103* 02/05/2015 0940   GLUCOSE 136* 08/19/2014 1337   GLUCOSE 157* 02/08/2014 0844   BUN 31* 02/05/2015 0940   BUN 32* 08/19/2014 1337   BUN 15 02/08/2014 0844   CREATININE 1.35* 02/05/2015 0940   CREATININE 1.36* 02/08/2014 0844   CALCIUM 8.3* 02/05/2015 0940   CALCIUM 8.8 02/08/2014 0844   PROT 7.8 02/05/2015 0940   PROT 7.9 12/06/2013 1453   ALBUMIN 2.6* 02/05/2015 0940   ALBUMIN 2.8* 12/06/2013 1453   AST 19 02/05/2015 0940   AST 16 12/06/2013 1453   ALT 16 02/05/2015 0940   ALT 12* 12/06/2013 1453   ALKPHOS 73 02/05/2015 0940   ALKPHOS 82 12/06/2013 1453   BILITOT 0.5 02/05/2015 0940   BILITOT 0.3 12/06/2013 1453   GFRNONAA 38* 02/05/2015 0940   GFRNONAA 41* 02/08/2014 0844   GFRNONAA 32* 07/30/2013 1508   GFRAA 44* 02/05/2015 0940   GFRAA 49* 02/08/2014 0844   GFRAA 37* 07/30/2013 1508    No results found for: SPEP, UPEP  Lab Results  Component Value Date   WBC 4.9 02/19/2015   NEUTROABS 3.8 02/19/2015   HGB 9.2* 02/19/2015   HCT 27.4* 02/19/2015   MCV 89.5 02/19/2015   PLT 242 02/19/2015      Chemistry      Component Value Date/Time   NA 135 02/05/2015 0940   NA 136 08/19/2014 1337   NA 135* 02/08/2014 0844   K 4.0 02/05/2015 0940   K 4.0  02/08/2014 0844   CL 106 02/05/2015 0940   CL 105 02/08/2014 0844   CO2 21* 02/05/2015 0940   CO2 21 02/08/2014 0844   BUN 31* 02/05/2015 0940   BUN 32* 08/19/2014 1337   BUN 15 02/08/2014 0844   CREATININE 1.35* 02/05/2015 0940   CREATININE 1.36* 02/08/2014 0844      Component Value Date/Time   CALCIUM 8.3* 02/05/2015 0940   CALCIUM 8.8 02/08/2014 0844   ALKPHOS 73 02/05/2015 0940   ALKPHOS 82 12/06/2013 1453   AST 19 02/05/2015 0940   AST 16 12/06/2013 1453   ALT 16 02/05/2015 0940   ALT 12* 12/06/2013 1453   BILITOT 0.5 02/05/2015 0940   BILITOT 0.3 12/06/2013 1453        ASSESSMENT & PLAN:   # LUNG CA- non-small cell lung stage IV; on Opdivo every 2 weeks. Patient tolerated treatment cycle #2  fairly well 2 weeks ago.  # Proceed with cycle #3  today. CBC is adequate except for chronic anemia of hemoglobin 8.5./CMP shows chronic kidney disease creatinine 1.3-1.6. Pt/husband understand that traetments are palliative/ and plan CT scan after 4 treatments.    # History of hypothyroidism on Synthroid- recently elevated TSH/lower T4.- Likely iatrogenic. Her dose of Synthroid has been lower to  25  By Dr.Lada.  # ANEMIA- from iron deficiency/history of AV malformations/bleeding. Patient's hemoglobin today is 9.5 -likely secondary malignancy/CKD.  # Patient follow-up with me in 2 weeks/labs Benjaman Kindler, MD 02/19/2015 9:26 AM

## 2015-02-19 NOTE — Telephone Encounter (Signed)
Please let patient know that I am going to change her thyroid medicine dose from 25 to 50 mcg a day Recheck labs in 6 weeks

## 2015-02-19 NOTE — Telephone Encounter (Signed)
-----   Message from Cammie Sickle, MD sent at 02/19/2015 12:20 PM EST ----- Please inform patient that- her daily numbers are slightly worse/ recommend increased fluid intake.

## 2015-02-23 NOTE — Telephone Encounter (Signed)
Patient notified about medication change  

## 2015-02-25 ENCOUNTER — Other Ambulatory Visit: Payer: Self-pay | Admitting: *Deleted

## 2015-02-25 DIAGNOSIS — C3430 Malignant neoplasm of lower lobe, unspecified bronchus or lung: Secondary | ICD-10-CM

## 2015-02-25 MED ORDER — ALPRAZOLAM 0.25 MG PO TABS: 0.2500 mg | ORAL_TABLET | Freq: Every evening | ORAL | Status: DC | PRN

## 2015-02-27 MED ORDER — ALPRAZOLAM 0.25 MG PO TABS: 0.2500 mg | ORAL_TABLET | Freq: Every evening | ORAL | Status: DC | PRN

## 2015-02-27 NOTE — Addendum Note (Signed)
Addended by: Betti Cruz on: 02/27/2015 04:03 PM   Modules accepted: Orders

## 2015-03-02 ENCOUNTER — Ambulatory Visit: Payer: Medicare Other | Admitting: Family Medicine

## 2015-03-04 ENCOUNTER — Encounter: Payer: Self-pay | Admitting: Family Medicine

## 2015-03-04 ENCOUNTER — Ambulatory Visit (INDEPENDENT_AMBULATORY_CARE_PROVIDER_SITE_OTHER): Payer: Medicare Other | Admitting: Family Medicine

## 2015-03-04 VITALS — BP 111/67 | HR 89 | Temp 98.5°F | Wt 139.0 lb

## 2015-03-04 DIAGNOSIS — I10 Essential (primary) hypertension: Secondary | ICD-10-CM | POA: Diagnosis not present

## 2015-03-04 DIAGNOSIS — L02234 Carbuncle of groin: Secondary | ICD-10-CM | POA: Diagnosis not present

## 2015-03-04 DIAGNOSIS — K219 Gastro-esophageal reflux disease without esophagitis: Secondary | ICD-10-CM | POA: Diagnosis not present

## 2015-03-04 DIAGNOSIS — N183 Chronic kidney disease, stage 3 unspecified: Secondary | ICD-10-CM

## 2015-03-04 DIAGNOSIS — E038 Other specified hypothyroidism: Secondary | ICD-10-CM | POA: Diagnosis not present

## 2015-03-04 DIAGNOSIS — C3411 Malignant neoplasm of upper lobe, right bronchus or lung: Secondary | ICD-10-CM | POA: Diagnosis not present

## 2015-03-04 MED ORDER — TORSEMIDE 10 MG PO TABS: 10.0000 mg | ORAL_TABLET | Freq: Every day | ORAL | Status: DC

## 2015-03-04 MED ORDER — DOXYCYCLINE HYCLATE 100 MG PO TABS: 100.0000 mg | ORAL_TABLET | Freq: Two times a day (BID) | ORAL | Status: AC

## 2015-03-04 NOTE — Progress Notes (Signed)
BP 111/67 mmHg  Pulse 89  Temp(Src) 98.5 F (36.9 C)  Wt 139 lb (63.05 kg)  SpO2 99%   Subjective:    Patient ID: Katrina Kramer, female    DOB: Oct 22, 1940, 75 y.o.   MRN: [id number]  HPI: Katrina Kramer is a 75 y.o. female  Chief Complaint  Patient presents with  . Hypothyroidism    follow up  . Hypertension    followed by cardio  . Immunizations    she is due for a Prevnar but since she is getting treatments, is it ok?   Lung cancer; seeing Dr. B tomorrow at cancer center; going to get scan soon to see where treatments are headed  HTN; well controlled  Anemia; improved; no dark stools, energy level is fair, could be better  Renal dysfunction; worse last visit; hasn't seen kidney doctor for about 6 months; drinks water; drinks Boost; husband says she could drink more water  Thyroid just quit working with treatments; doses and TSH have been variable; taking medicine at 8:30 pm at least 3 hours after supper; could not tolerate taking it first thing in the morning  GERD is well-controlled on PPI; no trigger foods that she is aware of that make it worse   She is still having draining from sore on her abdomen  Relevant past medical, surgical, family and social history reviewed and updated as indicated. Interim medical history since our last visit reviewed. Allergies and medications reviewed and updated.  Review of Systems Per HPI unless specifically indicated above     Objective:    BP 111/67 mmHg  Pulse 89  Temp(Src) 98.5 F (36.9 C)  Wt 139 lb (63.05 kg)  SpO2 99%  Wt Readings from Last 3 Encounters:  03/05/15 137 lb 5.6 oz (62.3 kg)  03/04/15 139 lb (63.05 kg)  02/19/15 134 lb 14.7 oz (61.2 kg)    Physical Exam  Constitutional: No distress.  Elderly female seated in wheelchair, gait not assessed; appears frail and chronically ill  HENT:  Head: Normocephalic and atraumatic.  Mouth/Throat: No oropharyngeal exudate.  Eyes: EOM are normal. Right eye exhibits no  discharge. Left eye exhibits no discharge. No scleral icterus.  Neck: No JVD present. No tracheal deviation present. No thyromegaly present.  Cardiovascular: Normal rate and regular rhythm.   Pulmonary/Chest: Effort normal and breath sounds normal. She has no wheezes. She has no rales.  Abdominal: She exhibits no distension.  Neurological: She is alert.  Skin: Skin is warm. Lesion (carbuncle on the lower abdomen with about four or five drainage sites, indurated, erythematous) noted. No pallor.  Psychiatric: She has a normal mood and affect.       Assessment & Plan:   Problem List Items Addressed This Visit      Cardiovascular and Mediastinum   Hypertension    Very well controlled      Relevant Medications   torsemide (DEMADEX) 10 MG tablet     Respiratory   Lung cancer ([place])    Which has apparently spread; seeing oncologist tomorrow to see about next step in treatment        Digestive   Acid reflux    On medication; no known triggers        Endocrine   Hypothyroidism    Variable; monitor and adjust medicine as needed; recheck TSH, free T3, free T4 in another five weeks or so        Musculoskeletal and Integument   Carbuncle, groin - Primary  Relevant Orders   Wound culture (Completed)     Genitourinary   CKD (chronic kidney disease) stage 3, GFR 30-59 ml/min    Managed by nephrologist; she will be having labs drawn this week with oncologist; encouraged hydration; avoid NSAIDs          Follow up plan: Return in about 1 week (around 03/11/2015) for skin infection; 3 months for regular follow-up.  Meds ordered this encounter  Medications  . torsemide (DEMADEX) 10 MG tablet    Sig: Take 1 tablet (10 mg total) by mouth daily.    Dispense:  90 tablet    Refill:  0  . doxycycline (VIBRA-TABS) 100 MG tablet    Sig: Take 1 tablet (100 mg total) by mouth 2 (two) times daily.    Dispense:  20 tablet    Refill:  0    Please get this ready, and they will pick up  today after leaving my office; thank you!   Orders Placed This Encounter  Procedures  . Wound culture   An after-visit summary was printed and given to the patient at [email or web address].  Please see the patient instructions which may contain other information and recommendations beyond what is mentioned above in the assessment and plan.

## 2015-03-04 NOTE — Patient Instructions (Addendum)
Please do follow up with Dr. Jacinto Reap tomorrow We'll see what your labs show If you need something for aches or pains, try to use Tylenol (acetaminphen) instead of non-steroidals (which include Aleve, ibuprofen, Advil, Motrin, and naproxen); non-steroidals can cause long-term kidney damage Decrease the torsemide from 1-1/2 pills daily down to just 1 pill daily Start the new antibiotic Change the dry gauze on the sore frequently Use warm packs 15 minutes at a time four times a day for the first few days, be careful to not burn skin Call doctor on-call if you develop fevers Recheck thyroid labs in 5 weeks, either here or with Dr. Jacinto Reap (I'll enter the orders)

## 2015-03-05 ENCOUNTER — Inpatient Hospital Stay: Payer: Medicare Other

## 2015-03-05 ENCOUNTER — Inpatient Hospital Stay: Payer: Medicare Other | Attending: Internal Medicine

## 2015-03-05 ENCOUNTER — Encounter: Payer: Self-pay | Admitting: Internal Medicine

## 2015-03-05 ENCOUNTER — Inpatient Hospital Stay (HOSPITAL_BASED_OUTPATIENT_CLINIC_OR_DEPARTMENT_OTHER): Payer: Medicare Other | Admitting: Internal Medicine

## 2015-03-05 VITALS — BP 99/64 | HR 92 | Temp 97.6°F | Resp 18 | Ht <= 58 in | Wt 137.3 lb

## 2015-03-05 DIAGNOSIS — Z79899 Other long term (current) drug therapy: Secondary | ICD-10-CM | POA: Insufficient documentation

## 2015-03-05 DIAGNOSIS — C3411 Malignant neoplasm of upper lobe, right bronchus or lung: Secondary | ICD-10-CM | POA: Diagnosis not present

## 2015-03-05 DIAGNOSIS — L739 Follicular disorder, unspecified: Secondary | ICD-10-CM | POA: Diagnosis not present

## 2015-03-05 DIAGNOSIS — E876 Hypokalemia: Secondary | ICD-10-CM | POA: Diagnosis not present

## 2015-03-05 DIAGNOSIS — N189 Chronic kidney disease, unspecified: Secondary | ICD-10-CM | POA: Diagnosis not present

## 2015-03-05 DIAGNOSIS — I10 Essential (primary) hypertension: Secondary | ICD-10-CM | POA: Diagnosis not present

## 2015-03-05 DIAGNOSIS — I4891 Unspecified atrial fibrillation: Secondary | ICD-10-CM | POA: Diagnosis not present

## 2015-03-05 DIAGNOSIS — E039 Hypothyroidism, unspecified: Secondary | ICD-10-CM | POA: Diagnosis not present

## 2015-03-05 DIAGNOSIS — D631 Anemia in chronic kidney disease: Secondary | ICD-10-CM

## 2015-03-05 DIAGNOSIS — Z87891 Personal history of nicotine dependence: Secondary | ICD-10-CM | POA: Diagnosis not present

## 2015-03-05 DIAGNOSIS — I129 Hypertensive chronic kidney disease with stage 1 through stage 4 chronic kidney disease, or unspecified chronic kidney disease: Secondary | ICD-10-CM | POA: Insufficient documentation

## 2015-03-05 DIAGNOSIS — Z5111 Encounter for antineoplastic chemotherapy: Secondary | ICD-10-CM | POA: Insufficient documentation

## 2015-03-05 LAB — CBC WITH DIFFERENTIAL/PLATELET
BASOS ABS: 0 10*3/uL (ref 0–0.1)
Basophils Relative: 0 %
EOS ABS: 0.1 10*3/uL (ref 0–0.7)
EOS PCT: 1 %
HCT: 25.9 % — ABNORMAL LOW (ref 35.0–47.0)
HEMOGLOBIN: 8.6 g/dL — AB (ref 12.0–16.0)
LYMPHS PCT: 8 %
Lymphs Abs: 0.6 10*3/uL — ABNORMAL LOW (ref 1.0–3.6)
MCH: 29.6 pg (ref 26.0–34.0)
MCHC: 33.3 g/dL (ref 32.0–36.0)
MCV: 88.9 fL (ref 80.0–100.0)
Monocytes Absolute: 0.5 10*3/uL (ref 0.2–0.9)
Monocytes Relative: 6 %
NEUTROS PCT: 85 %
Neutro Abs: 6.4 10*3/uL (ref 1.4–6.5)
PLATELETS: 320 10*3/uL (ref 150–440)
RBC: 2.91 MIL/uL — AB (ref 3.80–5.20)
RDW: 21.1 % — ABNORMAL HIGH (ref 11.5–14.5)
WBC: 7.6 10*3/uL (ref 3.6–11.0)

## 2015-03-05 LAB — COMPREHENSIVE METABOLIC PANEL
ALK PHOS: 94 U/L (ref 38–126)
ALT: 18 U/L (ref 14–54)
AST: 23 U/L (ref 15–41)
Albumin: 2.8 g/dL — ABNORMAL LOW (ref 3.5–5.0)
Anion gap: 6 (ref 5–15)
BILIRUBIN TOTAL: 0.9 mg/dL (ref 0.3–1.2)
BUN: 45 mg/dL — AB (ref 6–20)
CALCIUM: 8.3 mg/dL — AB (ref 8.9–10.3)
CO2: 23 mmol/L (ref 22–32)
Chloride: 100 mmol/L — ABNORMAL LOW (ref 101–111)
Creatinine, Ser: 2.11 mg/dL — ABNORMAL HIGH (ref 0.44–1.00)
GFR calc Af Amer: 26 mL/min — ABNORMAL LOW (ref >60)
GFR calc non Af Amer: 22 mL/min — ABNORMAL LOW (ref >60)
GLUCOSE: 149 mg/dL — AB (ref 65–99)
Potassium: 3.3 mmol/L — ABNORMAL LOW (ref 3.5–5.1)
SODIUM: 129 mmol/L — AB (ref 135–145)
TOTAL PROTEIN: 8.6 g/dL — AB (ref 6.5–8.1)

## 2015-03-05 MED ORDER — HEPARIN SOD (PORK) LOCK FLUSH 100 UNIT/ML IV SOLN
500.0000 [IU] | Freq: Once | INTRAVENOUS | Status: AC
  Administered 2015-03-05: 500 [IU] via INTRAVENOUS

## 2015-03-05 MED ORDER — SODIUM CHLORIDE 0.9% FLUSH
10.0000 mL | INTRAVENOUS | Status: DC | PRN
  Administered 2015-03-05: 10 mL via INTRAVENOUS
  Filled 2015-03-05: qty 10

## 2015-03-05 MED ORDER — SODIUM CHLORIDE 0.9 % IV SOLN
240.0000 mg | Freq: Once | INTRAVENOUS | Status: AC
  Administered 2015-03-05: 240 mg via INTRAVENOUS
  Filled 2015-03-05: qty 20

## 2015-03-05 MED ORDER — SODIUM CHLORIDE 0.9 % IV SOLN
Freq: Once | INTRAVENOUS | Status: AC
  Administered 2015-03-05: 10:00:00 via INTRAVENOUS
  Filled 2015-03-05: qty 1000

## 2015-03-05 MED ORDER — HEPARIN SOD (PORK) LOCK FLUSH 100 UNIT/ML IV SOLN
INTRAVENOUS | Status: AC
  Filled 2015-03-05: qty 5

## 2015-03-05 NOTE — Progress Notes (Signed)
[facility] OFFICE PROGRESS NOTE  Patient Care Team: Arnetha Courser, MD as PCP - General (Family Medicine) Manya Silvas, MD (Gastroenterology) Cammie Sickle, MD as Consulting Physician (Oncology)   SUMMARY OF ONCOLOGIC HISTORY:  # JAN 2013- RECURRENT/METASTATIC NON-SMALL CELL LUNG CA  [Right UL s/p Bronch; CT Bx- insuff] Carbo-taxol- RT   # JAN 2014- Pleural Bx- NEG/tacl pleurodesis; MARCH 2014- PET- Multifocal pleural activity ? Spread; April 2014- VERISTRAT- good; MAY 2014- Tarceva; July 2014- Tarceva Stopped sec to Declining PS- on hospice; OFF Hospice as pt is steady; CT June 2016- ? NED  # JAN 2017- PROGRESSION of Right Lung Ca [CT- Jan 2017]- Start OPDIVO q 2W  # OCT 2016- ANEMIA- sec to GIB/ AV malformation- [s/p EGD/Colo]; NOV 2016- START PO iron again; CKD [creatinine- 1.6]  INTERVAL HISTORY:  [name] with above history of recurrent/metastatic non-small cell lung cancer is here status post cycle #3 of Opdivo approximately 2 weeks ago.   Denies any worsening cough;  Denies any worse and shortness of breath or chest pain. No hemoptysis.  Denies any unusual headaches or double vision. No skin rash or itch.no diarrhea.     Patient has been started on doxycycline by her PCP  For  Folliculitis in the suprapubic area.  There is no pus.   REVIEW OF SYSTEMS:  A complete 10 point review of system is done which is negative except mentioned above/history of present illness.   PAST MEDICAL HISTORY :  Past Medical History  Diagnosis Date  . Atrial fibrillation ([place])     Dr. Humphrey Rolls, cardiologist  . Anemia   . Hypothyroidism   . Lung cancer ([place])   . Hypertension   . Lung cancer ([place])   . Anemia     PAST SURGICAL HISTORY :   Past Surgical History  Procedure Laterality Date  . Upper gastrointestinal endoscopy  02/24/14  . Esophagogastroduodenoscopy N/A 10/10/2014    Procedure: ESOPHAGOGASTRODUODENOSCOPY (EGD);  Surgeon:  Manya Silvas, MD;  Location: [facility] ENDOSCOPY;  Service: Endoscopy;  Laterality: N/A;  . Colonoscopy N/A 10/11/2014    Procedure: COLONOSCOPY;  Surgeon: Manya Silvas, MD;  Location: [facility] ENDOSCOPY;  Service: Endoscopy;  Laterality: N/A;    FAMILY HISTORY :   Family History  Problem Relation Age of Onset  . Stomach cancer Mother   . Cancer Mother     unsure where but states "it spead all over"  . Alzheimer's disease Father   . Varicose Veins Daughter   . Diabetes Neg Hx   . Heart disease Neg Hx   . Hypertension Neg Hx   . Stroke Neg Hx   . COPD Neg Hx     SOCIAL HISTORY:   Social History  Substance Use Topics  . Smoking status: Former Smoker -- 0.75 packs/day for 30 years    Types: Cigarettes    Quit date: 01/03/2005  . Smokeless tobacco: Never Used  . Alcohol Use: No    ALLERGIES:  has No Known Allergies.  MEDICATIONS:  Current Outpatient Prescriptions  Medication Sig Dispense Refill  . albuterol (PROVENTIL) (2.5 MG/3ML) 0.083% nebulizer solution Inhale 2.5 mg into the lungs.    . ALPRAZolam (XANAX) 0.25 MG tablet Take 1 tablet (0.25 mg total) by mouth at bedtime as needed for anxiety. 30 tablet 1  . amiodarone (PACERONE) 200 MG tablet Take 200 mg by mouth daily.     Marland Kitchen atorvastatin (LIPITOR) 20 MG tablet Take 20 mg  by mouth at bedtime.     . benzonatate (TESSALON) 100 MG capsule Take 100 mg by mouth 3 (three) times daily as needed for cough.    . chlorpheniramine (ALLERGY) 4 MG tablet Take 4 mg by mouth. Reported on 01/02/2015    . doxycycline (VIBRA-TABS) 100 MG tablet Take 1 tablet (100 mg total) by mouth 2 (two) times daily. 20 tablet 0  . Ferrous Sulfate 27 MG TABS Take 27 mg by mouth 3 (three) times daily.    Marland Kitchen gabapentin (NEURONTIN) 100 MG capsule Take 1 capsule by mouth two times daily 180 capsule 1  . guaifenesin (ROBITUSSIN) 100 MG/5ML syrup Take 100 mg by mouth every 6 (six) hours as needed for cough.    . isosorbide mononitrate (IMDUR) 30 MG 24 hr tablet  Take 30 mg by mouth daily.    Marland Kitchen levothyroxine (SYNTHROID, LEVOTHROID) 50 MCG tablet Take 1 tablet (50 mcg total) by mouth daily. 90 tablet 0  . megestrol (MEGACE ES) 625 MG/5ML suspension Take 5 mLs (625 mg total) by mouth daily. 150 mL 3  . potassium chloride SA (K-DUR,KLOR-CON) 20 MEQ tablet Take 1 tablet by mouth two  times daily (Patient taking differently: TAKE 1 TABLET DAILY) 180 tablet 0  . torsemide (DEMADEX) 10 MG tablet Take 1 tablet (10 mg total) by mouth daily. 90 tablet 0  . carvedilol (COREG) 3.125 MG tablet Take 3.125 mg by mouth 2 (two) times daily with a meal.     No current facility-administered medications for this visit.   Facility-Administered Medications Ordered in Other Visits  Medication Dose Route Frequency Provider Last Rate Last Dose  . 0.9 %  sodium chloride infusion   Intravenous Continuous Leia Alf, MD 20 mL/hr at 07/04/14 1150    . heparin lock flush 100 unit/mL  500 Units Intravenous Once Leia Alf, MD      . heparin lock flush 100 unit/mL  500 Units Intravenous Once Leia Alf, MD      . sodium chloride 0.9 % injection 10 mL  10 mL Intravenous PRN Leia Alf, MD      . sodium chloride 0.9 % injection 10 mL  10 mL Intravenous PRN Leia Alf, MD   10 mL at 07/04/14 1145    PHYSICAL EXAMINATION: ECOG PERFORMANCE STATUS: 3 - Symptomatic, >50% confined to bed  BP 99/64 mmHg  Pulse 92  Temp(Src) 97.6 F (36.4 C) (Tympanic)  Resp 18  Ht '4\' 10"'$  (1.473 m)  Wt 137 lb 5.6 oz (62.3 kg)  BMI 28.71 kg/m2  Filed Weights   03/05/15 0915  Weight: 137 lb 5.6 oz (62.3 kg)    GENERAL:  Alert, no distress and comfortable. Thin built/moderately nourished  In a wheel chair;  She is accompanied by husband.  EYES: no pallor or icterus OROPHARYNX: no thrush or ulceration; poor dentition  NECK: supple, no masses felt LYMPH:  no palpable lymphadenopathy in the cervical, axillary or inguinal regions LUNGS: Decreased breath sounds bilaterally at the  bases. and  No wheeze or crackles HEART/CVS: regular rate & rhythm and no murmurs; No lower extremity edema ABDOMEN:abdomen soft, non-tender and normal bowel sounds Musculoskeletal:no cyanosis of digits and no clubbing  PSYCH: alert & oriented x 3 with fluent speech NEURO: no focal motor/sensory deficits SKIN:  no rashes or significant lesions;  Right suprapubic area- area of folliculitis.   LABORATORY DATA:  I have reviewed the data as listed    Component Value Date/Time   NA 129* 03/05/2015 7741  NA 136 08/19/2014 1337   NA 135* 02/08/2014 0844   K 3.3* 03/05/2015 0848   K 4.0 02/08/2014 0844   CL 100* 03/05/2015 0848   CL 105 02/08/2014 0844   CO2 23 03/05/2015 0848   CO2 21 02/08/2014 0844   GLUCOSE 149* 03/05/2015 0848   GLUCOSE 136* 08/19/2014 1337   GLUCOSE 157* 02/08/2014 0844   BUN 45* 03/05/2015 0848   BUN 32* 08/19/2014 1337   BUN 15 02/08/2014 0844   CREATININE 2.11* 03/05/2015 0848   CREATININE 1.36* 02/08/2014 0844   CALCIUM 8.3* 03/05/2015 0848   CALCIUM 8.8 02/08/2014 0844   PROT 8.6* 03/05/2015 0848   PROT 7.9 12/06/2013 1453   ALBUMIN 2.8* 03/05/2015 0848   ALBUMIN 2.8* 12/06/2013 1453   AST 23 03/05/2015 0848   AST 16 12/06/2013 1453   ALT 18 03/05/2015 0848   ALT 12* 12/06/2013 1453   ALKPHOS 94 03/05/2015 0848   ALKPHOS 82 12/06/2013 1453   BILITOT 0.9 03/05/2015 0848   BILITOT 0.3 12/06/2013 1453   GFRNONAA 22* 03/05/2015 0848   GFRNONAA 41* 02/08/2014 0844   GFRNONAA 32* 07/30/2013 1508   GFRAA 26* 03/05/2015 0848   GFRAA 49* 02/08/2014 0844   GFRAA 37* 07/30/2013 1508    No results found for: SPEP, UPEP  Lab Results  Component Value Date   WBC 7.6 03/05/2015   NEUTROABS 6.4 03/05/2015   HGB 8.6* 03/05/2015   HCT 25.9* 03/05/2015   MCV 88.9 03/05/2015   PLT 320 03/05/2015      Chemistry      Component Value Date/Time   NA 129* 03/05/2015 0848   NA 136 08/19/2014 1337   NA 135* 02/08/2014 0844   K 3.3* 03/05/2015 0848   K  4.0 02/08/2014 0844   CL 100* 03/05/2015 0848   CL 105 02/08/2014 0844   CO2 23 03/05/2015 0848   CO2 21 02/08/2014 0844   BUN 45* 03/05/2015 0848   BUN 32* 08/19/2014 1337   BUN 15 02/08/2014 0844   CREATININE 2.11* 03/05/2015 0848   CREATININE 1.36* 02/08/2014 0844      Component Value Date/Time   CALCIUM 8.3* 03/05/2015 0848   CALCIUM 8.8 02/08/2014 0844   ALKPHOS 94 03/05/2015 0848   ALKPHOS 82 12/06/2013 1453   AST 23 03/05/2015 0848   AST 16 12/06/2013 1453   ALT 18 03/05/2015 0848   ALT 12* 12/06/2013 1453   BILITOT 0.9 03/05/2015 0848   BILITOT 0.3 12/06/2013 1453        ASSESSMENT & PLAN:   # LUNG CA- non-small cell lung stage IV; on Opdivo every 2 weeks. Patient tolerated treatment cycle #3  fairly well 2 weeks ago.  # Proceed with cycle #4  today. CBC is adequate except for chronic anemia of hemoglobin 8.5. Pt/husband understand that traetments are palliative/ and plan PET scan after this treatment. Further treatment would be based upon patient's response to therapy.  # chronic kidney disease with a creatinine of 1.6 daily creatinine is up to 2.1. Recommend increased by mouth intake/by mouth fluids.  # folliculitis- on doxycycline.  # anemia hemoglobin 8.6- likely from chronic renal insufficiency-secondary to malignancy/hold off Procrit at this time.  # History of hypothyroidism on Synthroid- recently elevated TSH/lower T4.- Likely iatrogenic. Her dose of Synthroid has been lower to 25  By Dr.Lada.we will repeat thyroid profile the next few weeks.  # Patient follow-up with me in 2 weeks/labs Opdivo.  # 25 minutes face-to-face with the patient  discussing the above plan of care; more than 50% of time spent on prognosis/ natural history; counseling and coordination.    Cammie Sickle, MD 03/05/2015 9:30 AM

## 2015-03-05 NOTE — Progress Notes (Signed)
Spoke with Dr. Rogue Bussing, go ahead with treatment based on labs. LJ

## 2015-03-05 NOTE — Progress Notes (Signed)
Pt tolerating chemo. Appetite and bowels normal. Wt steady. Some production of sputum occ.but it is clear or white. No fevers. Up and ambulating at home. Was started on doxcycyline yest. For a boil on her stomach.

## 2015-03-06 LAB — WOUND CULTURE

## 2015-03-08 ENCOUNTER — Other Ambulatory Visit: Payer: Self-pay | Admitting: Family Medicine

## 2015-03-09 ENCOUNTER — Telehealth: Payer: Self-pay | Admitting: Family Medicine

## 2015-03-09 NOTE — Telephone Encounter (Signed)
With her recent kidney function, I would like to turn over this prescription to her kidney doctor Please ask her to meet with her kidney doctor to see if he wants her to stay on this The sister medicine that goes with this is the potassium I do not want to continue to refill this, and will respectfully defer this to the kidney doctor

## 2015-03-09 NOTE — Telephone Encounter (Signed)
Patient notified

## 2015-03-09 NOTE — Telephone Encounter (Signed)
Dr. B replied to my note about her kidney function; he has given her IVF there; he agrees with having pt see her nephrologist I do not know which doctor it is, but she goes to [facility] I called home number, left message, asked them to call to schedule appt with her kidney doctor

## 2015-03-10 ENCOUNTER — Telehealth: Payer: Self-pay

## 2015-03-10 NOTE — Telephone Encounter (Signed)
-----   Message from Arnetha Courser, MD sent at 03/09/2015  2:40 PM EST ----- Regarding: RE: check on PCV-13 with pulmonologist Please let her know that I recommend she receive this vaccine, PCV-13  ----- Message -----    From: Staci Acosta, CMA    Sent: 03/09/2015   2:32 PM      To: Arnetha Courser, MD Subject: RE: check on PCV-13 with pulmonologist         They have not given her a Prevnar. ----- Message -----    From: Arnetha Courser, MD    Sent: 03/04/2015   3:55 PM      To: Harmonie Verrastro Juliann Pares, CMA Subject: check on PCV-13 with pulmonologist             Can you please call Dr. Raul Del and see if she has already had the PCV-13. I would be surprised if he has not already given her this in the last 2-1/2 years.

## 2015-03-10 NOTE — Telephone Encounter (Signed)
Patient notified, she has a follow up appointment here on the 13th, she'll get it in.

## 2015-03-11 NOTE — Assessment & Plan Note (Signed)
Very well controlled

## 2015-03-11 NOTE — Assessment & Plan Note (Signed)
On medication; no known triggers

## 2015-03-11 NOTE — Assessment & Plan Note (Signed)
Managed by nephrologist; she will be having labs drawn this week with oncologist; encouraged hydration; avoid NSAIDs

## 2015-03-11 NOTE — Assessment & Plan Note (Signed)
Which has apparently spread; seeing oncologist tomorrow to see about next step in treatment

## 2015-03-11 NOTE — Assessment & Plan Note (Addendum)
Variable; monitor and adjust medicine as needed; recheck TSH, free T3, free T4 in another five weeks or so

## 2015-03-12 ENCOUNTER — Telehealth: Payer: Self-pay | Admitting: *Deleted

## 2015-03-12 ENCOUNTER — Ambulatory Visit
Admission: RE | Admit: 2015-03-12 | Discharge: 2015-03-12 | Disposition: A | Discharge status: Home / Self Care | Payer: Medicare Other | Source: Ambulatory Visit | Attending: Internal Medicine | Admitting: Internal Medicine

## 2015-03-12 DIAGNOSIS — C3411 Malignant neoplasm of upper lobe, right bronchus or lung: Secondary | ICD-10-CM | POA: Diagnosis present

## 2015-03-12 DIAGNOSIS — J181 Lobar pneumonia, unspecified organism: Secondary | ICD-10-CM | POA: Insufficient documentation

## 2015-03-12 LAB — GLUCOSE, CAPILLARY: GLUCOSE-CAPILLARY: 79 mg/dL (ref 65–99)

## 2015-03-12 MED ORDER — FLUDEOXYGLUCOSE F - 18 (FDG) INJECTION
12.7100 | Freq: Once | INTRAVENOUS | Status: AC | PRN
  Administered 2015-03-12: 12.71 via INTRAVENOUS

## 2015-03-12 NOTE — Telephone Encounter (Signed)
Called pt and let her know that I have thepaper work completed that they left with me last week.  It will be down front where they check in with pt's name on it. She will let her husband know.

## 2015-03-13 ENCOUNTER — Other Ambulatory Visit: Payer: Self-pay | Admitting: Family Medicine

## 2015-03-13 NOTE — Telephone Encounter (Signed)
RX was sent to local pharmacy.

## 2015-03-13 NOTE — Telephone Encounter (Signed)
This looks like it was recently refilled

## 2015-03-16 ENCOUNTER — Ambulatory Visit (INDEPENDENT_AMBULATORY_CARE_PROVIDER_SITE_OTHER): Payer: Medicare Other | Admitting: Family Medicine

## 2015-03-16 ENCOUNTER — Encounter: Payer: Self-pay | Admitting: Family Medicine

## 2015-03-16 ENCOUNTER — Telehealth: Payer: Self-pay

## 2015-03-16 VITALS — BP 115/70 | HR 90 | Wt 133.0 lb

## 2015-03-16 DIAGNOSIS — L02234 Carbuncle of groin: Secondary | ICD-10-CM

## 2015-03-16 MED ORDER — CEPHALEXIN 500 MG PO CAPS: 500.0000 mg | ORAL_CAPSULE | Freq: Two times a day (BID) | ORAL | Status: DC

## 2015-03-16 NOTE — Patient Instructions (Signed)
We'll contact the nephrologist today and get you on some antibiotics We'll have you see the general surgeon to see if this area needs to be drained

## 2015-03-16 NOTE — Telephone Encounter (Signed)
Thank you; I was hoping to talk to him to get a dose and frequency recommendation I found what I was looking for; confirmed with pharmacist, same dose but decrease frequency to q 12 hours rx sent; patient due for labs on Thursday, will monitor kidneys then; pharmacist aware

## 2015-03-16 NOTE — Assessment & Plan Note (Signed)
Refer to surg; contact nephrologist for guidance with ABX since failing doxy and creatinine > 2

## 2015-03-16 NOTE — Telephone Encounter (Signed)
Dr.Kollru recommended Keflex.

## 2015-03-18 ENCOUNTER — Other Ambulatory Visit: Payer: Self-pay | Admitting: Internal Medicine

## 2015-03-19 ENCOUNTER — Inpatient Hospital Stay (HOSPITAL_BASED_OUTPATIENT_CLINIC_OR_DEPARTMENT_OTHER): Payer: Medicare Other | Admitting: Internal Medicine

## 2015-03-19 ENCOUNTER — Inpatient Hospital Stay: Payer: Medicare Other

## 2015-03-19 VITALS — BP 90/54 | HR 87 | Temp 96.7°F | Resp 20 | Wt 136.7 lb

## 2015-03-19 DIAGNOSIS — L739 Follicular disorder, unspecified: Secondary | ICD-10-CM

## 2015-03-19 DIAGNOSIS — N189 Chronic kidney disease, unspecified: Secondary | ICD-10-CM | POA: Diagnosis not present

## 2015-03-19 DIAGNOSIS — C3411 Malignant neoplasm of upper lobe, right bronchus or lung: Secondary | ICD-10-CM | POA: Diagnosis not present

## 2015-03-19 DIAGNOSIS — I129 Hypertensive chronic kidney disease with stage 1 through stage 4 chronic kidney disease, or unspecified chronic kidney disease: Secondary | ICD-10-CM

## 2015-03-19 DIAGNOSIS — D631 Anemia in chronic kidney disease: Secondary | ICD-10-CM | POA: Diagnosis not present

## 2015-03-19 DIAGNOSIS — Z79899 Other long term (current) drug therapy: Secondary | ICD-10-CM

## 2015-03-19 DIAGNOSIS — Z5111 Encounter for antineoplastic chemotherapy: Secondary | ICD-10-CM | POA: Diagnosis not present

## 2015-03-19 LAB — COMPREHENSIVE METABOLIC PANEL
ALK PHOS: 96 U/L (ref 38–126)
ALT: 18 U/L (ref 14–54)
ANION GAP: 8 (ref 5–15)
AST: 27 U/L (ref 15–41)
Albumin: 2.6 g/dL — ABNORMAL LOW (ref 3.5–5.0)
BILIRUBIN TOTAL: 0.8 mg/dL (ref 0.3–1.2)
BUN: 46 mg/dL — ABNORMAL HIGH (ref 6–20)
CALCIUM: 8.3 mg/dL — AB (ref 8.9–10.3)
CO2: 24 mmol/L (ref 22–32)
Chloride: 100 mmol/L — ABNORMAL LOW (ref 101–111)
Creatinine, Ser: 1.89 mg/dL — ABNORMAL HIGH (ref 0.44–1.00)
GFR, EST AFRICAN AMERICAN: 29 mL/min — AB (ref >60)
GFR, EST NON AFRICAN AMERICAN: 25 mL/min — AB (ref >60)
GLUCOSE: 183 mg/dL — AB (ref 65–99)
POTASSIUM: 3.2 mmol/L — AB (ref 3.5–5.1)
Sodium: 132 mmol/L — ABNORMAL LOW (ref 135–145)
TOTAL PROTEIN: 8.3 g/dL — AB (ref 6.5–8.1)

## 2015-03-19 LAB — CBC WITH DIFFERENTIAL/PLATELET
BASOS ABS: 0 10*3/uL (ref 0–0.1)
BASOS PCT: 0 %
Eosinophils Absolute: 0.1 10*3/uL (ref 0–0.7)
Eosinophils Relative: 1 %
HEMATOCRIT: 26.1 % — AB (ref 35.0–47.0)
HEMOGLOBIN: 8.6 g/dL — AB (ref 12.0–16.0)
LYMPHS PCT: 14 %
Lymphs Abs: 0.9 10*3/uL — ABNORMAL LOW (ref 1.0–3.6)
MCH: 29.2 pg (ref 26.0–34.0)
MCHC: 33.1 g/dL (ref 32.0–36.0)
MCV: 88.1 fL (ref 80.0–100.0)
MONOS PCT: 9 %
Monocytes Absolute: 0.6 10*3/uL (ref 0.2–0.9)
NEUTROS ABS: 4.7 10*3/uL (ref 1.4–6.5)
NEUTROS PCT: 76 %
Platelets: 298 10*3/uL (ref 150–440)
RBC: 2.96 MIL/uL — ABNORMAL LOW (ref 3.80–5.20)
RDW: 20.7 % — ABNORMAL HIGH (ref 11.5–14.5)
WBC: 6.2 10*3/uL (ref 3.6–11.0)

## 2015-03-19 MED ORDER — PREDNISONE 20 MG PO TABS: 20.0000 mg | ORAL_TABLET | Freq: Every day | ORAL | Status: DC

## 2015-03-19 NOTE — Progress Notes (Signed)
[place] OFFICE PROGRESS NOTE  Patient Care Team: Arnetha Courser, MD as PCP - General (Family Medicine) Manya Silvas, MD (Gastroenterology) Cammie Sickle, MD as Consulting Physician (Oncology) Lavonia Dana, MD as Consulting Physician (Nephrology)   SUMMARY OF ONCOLOGIC HISTORY:  # JAN 2013- RECURRENT/METASTATIC NON-SMALL CELL LUNG CA  [Right UL s/p Bronch; CT Bx- insuff] Carbo-taxol- RT   # JAN 2014- Pleural Bx- NEG/tacl pleurodesis; MARCH 2014- PET- Multifocal pleural activity ? Spread; April 2014- VERISTRAT- good; MAY 2014- Tarceva; July 2014- Tarceva Stopped sec to Declining PS- on hospice; OFF Hospice as pt is steady; CT June 2016- ? 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 She is here accompanied by family to review the results of the PET scan.  As per the family patient is noted to have a decline in the overall functional status. She still feels weak few days after the treatment. She denies diarrhea. Her appetite is poor. She continues to have cough; mild to moderate shortness of breath. No hemoptysis.  Denies any unusual headaches or double vision. No skin rash or itch.no diarrhea.   As per family she is continuing spend most of the time resting /wheelchair for ambulation.   REVIEW OF SYSTEMS:  A complete 10 point review of system is done which is negative except mentioned above/history of present illness.   PAST MEDICAL HISTORY :  Past Medical History  Diagnosis Date  . Atrial  fibrillation ([place])     Dr. Humphrey Rolls, cardiologist  . Anemia   . Hypothyroidism   . Lung cancer ([place])   . Hypertension   . Lung cancer ([place])   . Anemia     PAST SURGICAL HISTORY :   Past Surgical History  Procedure Laterality Date  . Upper gastrointestinal endoscopy  02/24/14  . Esophagogastroduodenoscopy N/A 10/10/2014    Procedure: ESOPHAGOGASTRODUODENOSCOPY (EGD);  Surgeon: Manya Silvas, MD;  Location: [facility] ENDOSCOPY;  Service: Endoscopy;  Laterality: N/A;  . Colonoscopy N/A 10/11/2014    Procedure: COLONOSCOPY;  Surgeon: Manya Silvas, MD;  Location: [facility] ENDOSCOPY;  Service: Endoscopy;  Laterality: N/A;    FAMILY HISTORY :   Family History  Problem Relation Age of Onset  . Stomach cancer Mother   . Cancer Mother     unsure where but states "it spead all over"  . Alzheimer's disease Father   . Varicose Veins Daughter   . Diabetes Neg Hx   . Heart disease Neg Hx   . Hypertension Neg Hx   . Stroke Neg Hx   . COPD Neg Hx     SOCIAL HISTORY:   Social History  Substance Use Topics  . Smoking status: Former Smoker -- 0.75 packs/day for 30 years    Types: Cigarettes    Quit date: 01/03/2005  . Smokeless tobacco: Never Used  . Alcohol Use: No    ALLERGIES:  has No Known Allergies.  MEDICATIONS:  Current Outpatient Prescriptions  Medication Sig Dispense Refill  . albuterol (PROVENTIL) (2.5 MG/3ML) 0.083% nebulizer  solution Inhale 2.5 mg into the lungs.    . ALPRAZolam (XANAX) 0.25 MG tablet Take 1 tablet (0.25 mg total) by mouth at bedtime as needed for anxiety. 30 tablet 1  . amiodarone (PACERONE) 200 MG tablet Take 200 mg by mouth daily.     Marland Kitchen atorvastatin (LIPITOR) 20 MG tablet Take 20 mg by mouth at bedtime.     . benzonatate (TESSALON) 100 MG capsule Take 100 mg by mouth 3 (three) times daily as needed for cough.    . carvedilol (COREG) 3.125 MG tablet Take 3.125 mg by mouth 2 (two) times daily with a meal.    . cephALEXin (KEFLEX) 500 MG capsule Take 1 capsule  (500 mg total) by mouth 2 (two) times daily. 14 capsule 0  . chlorpheniramine (ALLERGY) 4 MG tablet Take 4 mg by mouth. Reported on 01/02/2015    . Ferrous Sulfate 27 MG TABS Take 27 mg by mouth 3 (three) times daily.    Marland Kitchen gabapentin (NEURONTIN) 100 MG capsule Take 1 capsule by mouth two times daily 180 capsule 1  . guaifenesin (ROBITUSSIN) 100 MG/5ML syrup Take 100 mg by mouth every 6 (six) hours as needed for cough.    . isosorbide mononitrate (IMDUR) 30 MG 24 hr tablet Take 30 mg by mouth daily.    Marland Kitchen levothyroxine (SYNTHROID, LEVOTHROID) 50 MCG tablet Take 1 tablet (50 mcg total) by mouth daily. 90 tablet 0  . megestrol (MEGACE ES) 625 MG/5ML suspension Take 5 mLs (625 mg total) by mouth daily. 150 mL 3  . potassium chloride SA (K-DUR,KLOR-CON) 20 MEQ tablet Take 1 tablet by mouth two  times daily (Patient taking differently: TAKE 1 TABLET DAILY) 180 tablet 0  . predniSONE (DELTASONE) 20 MG tablet Take 1 tablet (20 mg total) by mouth daily with breakfast. 7 tablet 0  . torsemide (DEMADEX) 10 MG tablet Take 1 tablet (10 mg total) by mouth daily. 90 tablet 0   No current facility-administered medications for this visit.   Facility-Administered Medications Ordered in Other Visits  Medication Dose Route Frequency Provider Last Rate Last Dose  . 0.9 %  sodium chloride infusion   Intravenous Continuous Leia Alf, MD 20 mL/hr at 07/04/14 1150    . heparin lock flush 100 unit/mL  500 Units Intravenous Once Leia Alf, MD      . heparin lock flush 100 unit/mL  500 Units Intravenous Once Leia Alf, MD      . sodium chloride 0.9 % injection 10 mL  10 mL Intravenous PRN Leia Alf, MD      . sodium chloride 0.9 % injection 10 mL  10 mL Intravenous PRN Leia Alf, MD   10 mL at 07/04/14 1145    PHYSICAL EXAMINATION: ECOG PERFORMANCE STATUS: 3 - Symptomatic, >50% confined to bed  BP 90/54 mmHg  Pulse 87  Temp(Src) 96.7 F (35.9 C) (Tympanic)  Resp 20  Wt 136 lb 11 oz (62 kg)   SpO2 99%  Filed Weights   03/19/15 1037  Weight: 136 lb 11 oz (62 kg)    GENERAL:  Alert, no distress and comfortable. Thin built/moderately nourished  In a wheel chair;  She is accompanied by husband/daughter/son.  EYES: no pallor or icterus OROPHARYNX: no thrush or ulceration; poor dentition  NECK: supple, no masses felt LYMPH:  no palpable lymphadenopathy in the cervical, axillary or inguinal regions LUNGS: Decreased breath sounds bilaterally at the bases. Scattered wheeze noted or crackles HEART/CVS: regular rate & rhythm and no murmurs;  No lower extremity edema ABDOMEN:abdomen soft, non-tender and normal bowel sounds Musculoskeletal:no cyanosis of digits and no clubbing  PSYCH: alert & oriented x 3 with fluent speech NEURO: no focal motor/sensory deficits SKIN:  no rashes or significant lesions;  Right suprapubic area- area of folliculitis.   LABORATORY DATA:  I have reviewed the data as listed    Component Value Date/Time   NA 132* 03/19/2015 0901   NA 136 08/19/2014 1337   NA 135* 02/08/2014 0844   K 3.2* 03/19/2015 0901   K 4.0 02/08/2014 0844   CL 100* 03/19/2015 0901   CL 105 02/08/2014 0844   CO2 24 03/19/2015 0901   CO2 21 02/08/2014 0844   GLUCOSE 183* 03/19/2015 0901   GLUCOSE 136* 08/19/2014 1337   GLUCOSE 157* 02/08/2014 0844   BUN 46* 03/19/2015 0901   BUN 32* 08/19/2014 1337   BUN 15 02/08/2014 0844   CREATININE 1.89* 03/19/2015 0901   CREATININE 1.36* 02/08/2014 0844   CALCIUM 8.3* 03/19/2015 0901   CALCIUM 8.8 02/08/2014 0844   PROT 8.3* 03/19/2015 0901   PROT 7.9 12/06/2013 1453   ALBUMIN 2.6* 03/19/2015 0901   ALBUMIN 2.8* 12/06/2013 1453   AST 27 03/19/2015 0901   AST 16 12/06/2013 1453   ALT 18 03/19/2015 0901   ALT 12* 12/06/2013 1453   ALKPHOS 96 03/19/2015 0901   ALKPHOS 82 12/06/2013 1453   BILITOT 0.8 03/19/2015 0901   BILITOT 0.3 12/06/2013 1453   GFRNONAA 25* 03/19/2015 0901   GFRNONAA 41* 02/08/2014 0844   GFRNONAA 32*  07/30/2013 1508   GFRAA 29* 03/19/2015 0901   GFRAA 49* 02/08/2014 0844   GFRAA 37* 07/30/2013 1508    No results found for: SPEP, UPEP  Lab Results  Component Value Date   WBC 6.2 03/19/2015   NEUTROABS 4.7 03/19/2015   HGB 8.6* 03/19/2015   HCT 26.1* 03/19/2015   MCV 88.1 03/19/2015   PLT 298 03/19/2015      Chemistry      Component Value Date/Time   NA 132* 03/19/2015 0901   NA 136 08/19/2014 1337   NA 135* 02/08/2014 0844   K 3.2* 03/19/2015 0901   K 4.0 02/08/2014 0844   CL 100* 03/19/2015 0901   CL 105 02/08/2014 0844   CO2 24 03/19/2015 0901   CO2 21 02/08/2014 0844   BUN 46* 03/19/2015 0901   BUN 32* 08/19/2014 1337   BUN 15 02/08/2014 0844   CREATININE 1.89* 03/19/2015 0901   CREATININE 1.36* 02/08/2014 0844      Component Value Date/Time   CALCIUM 8.3* 03/19/2015 0901   CALCIUM 8.8 02/08/2014 0844   ALKPHOS 96 03/19/2015 0901   ALKPHOS 82 12/06/2013 1453   AST 27 03/19/2015 0901   AST 16 12/06/2013 1453   ALT 18 03/19/2015 0901   ALT 12* 12/06/2013 1453   BILITOT 0.8 03/19/2015 0901   BILITOT 0.3 12/06/2013 1453        ASSESSMENT & PLAN:   # LUNG CA- non-small cell lung stage IV; on Opdivo every 2 weeks. Patient tolerated treatment cycle #4  With mild to moderate difficulties. I reviewed the PET scan with the patient's family; shows the right lower lobe lesion improving in size [currently 2.2 x 2.5 from 3.5 x3cm]; and the right lung continues to show extensive radiation changes.  # Given the continued decline in performance status/worsening kidney function- creatinine anywhere between 1.8-2. I recommend holding further treatment at this time. I had a long discussion with  the patient family regarding the difficult situation where adverse events from the treatments/declining performance status might preclude further therapy.   # They're interested in hospice again at this time. A referral will be initiated.  # Ongoing cough- scattered wheezing  heard- recommend a trial of prednisone. Continue breathing treatments.  # chronic kidney disease with a creatinine of 1.6 daily creatinine is up to 1.8- 2.0. I have spoken to Dr.Kolluru; who agreed to see the patient. However given the change in the overall treatment plan/enrollment in hospice/financial issues as expressed by the family- I think it's reasonable to hold nephrology re- evaluation this time.  # folliculitis- on Keflex as per PCP.  # Hypokalemia likely secondary to Demadex; recommend increasing the potassium- to 3 pills [60 mEq]x for 7 days. And then go back to 2 pills once a day.   # anemia hemoglobin 8.6- likely from chronic renal insufficiency-secondary to malignancy/hold off Procrit at this time.  # Patient follow-up with me in 2 weeks/labs/no treatment. Also given coupons for boost;   # 40 minutes face-to-face with the patient discussing the above plan of care; more than 50% of time spent on prognosis/ natural history; counseling and coordination.    Cammie Sickle, MD 03/19/2015 12:22 PM

## 2015-03-19 NOTE — Progress Notes (Signed)
Patient here today for ongoing follow up and treatment consideration regarding lung cancer. Patient denies pain today. Patients family is requesting transition to [facility], reports financial concerns in regards to medication expenses. Patient reports cough today.

## 2015-03-20 ENCOUNTER — Other Ambulatory Visit: Payer: Self-pay | Admitting: *Deleted

## 2015-03-20 DIAGNOSIS — C3430 Malignant neoplasm of lower lobe, unspecified bronchus or lung: Secondary | ICD-10-CM

## 2015-03-20 MED ORDER — MEGESTROL ACETATE 625 MG/5ML PO SUSP: 625.0000 mg | Freq: Every day | ORAL | Status: DC

## 2015-03-24 DIAGNOSIS — C3411 Malignant neoplasm of upper lobe, right bronchus or lung: Secondary | ICD-10-CM | POA: Diagnosis not present

## 2015-03-24 DIAGNOSIS — D649 Anemia, unspecified: Secondary | ICD-10-CM | POA: Diagnosis not present

## 2015-03-24 DIAGNOSIS — I4891 Unspecified atrial fibrillation: Secondary | ICD-10-CM | POA: Diagnosis not present

## 2015-03-24 DIAGNOSIS — I129 Hypertensive chronic kidney disease with stage 1 through stage 4 chronic kidney disease, or unspecified chronic kidney disease: Secondary | ICD-10-CM | POA: Diagnosis not present

## 2015-03-24 DIAGNOSIS — N189 Chronic kidney disease, unspecified: Secondary | ICD-10-CM | POA: Diagnosis not present

## 2015-03-24 DIAGNOSIS — Z87891 Personal history of nicotine dependence: Secondary | ICD-10-CM | POA: Diagnosis not present

## 2015-03-24 DIAGNOSIS — E039 Hypothyroidism, unspecified: Secondary | ICD-10-CM | POA: Diagnosis not present

## 2015-03-24 NOTE — Progress Notes (Signed)
  BP 115/70 mmHg  Pulse 90  Wt 133 lb (60.328 kg)  SpO2 98%   Subjective:    Patient ID: Katrina Kramer, female    DOB: Nov 20, 1940, 75 y.o.   MRN: [id number]  HPI: Katrina Kramer is a 75 y.o. female  Chief Complaint  Patient presents with  . Follow-up    skin lesion   Patient is seen today for follow-up of the carbuncle in her groin area; she has been taking antibiotics; she continues to have drainage, though, from several pores and is using a pad to help control the drainage; she denies fevers  Relevant past medical, surgical, history reviewed Interim medical history since our last visit reviewed. Allergies and medications reviewed and updated.  Review of Systems Per HPI unless specifically indicated above     Objective:    BP 115/70 mmHg  Pulse 90  Wt 133 lb (60.328 kg)  SpO2 98%  Wt Readings from Last 3 Encounters:  03/19/15 136 lb 11 oz (62 kg)  03/16/15 133 lb (60.328 kg)  03/05/15 137 lb 5.6 oz (62.3 kg)    Physical Exam  Constitutional: She appears well-developed and well-nourished.  Elderly frail female seated in wheelchair; gait not assessed  Cardiovascular: Normal rate.   Pulmonary/Chest: Effort normal.  Skin:  Two areas of fluctuance and drainage, two carbuncles over the mons pubis, smaller one left of midline, larger one right of midline; purulent material draining from multiple sites on each    Results for orders placed or performed during the hospital encounter of 03/12/15  Glucose, capillary  Result Value Ref Range   Glucose-Capillary 79 65 - 99 mg/dL      Assessment & Plan:   Problem List Items Addressed This Visit      Musculoskeletal and Integument   Carbuncle, groin - Primary    Refer to surg; contact nephrologist for guidance with ABX since failing doxy and creatinine > 2      Relevant Medications   cephALEXin (KEFLEX) 500 MG capsule   Other Relevant Orders   Ambulatory referral to Gynecology      Follow up plan: No Follow-up on  file.  An after-visit summary was printed and given to the patient at [place].  Please see the patient instructions which may contain other information and recommendations beyond what is mentioned above in the assessment and plan.  Meds ordered this encounter  Medications  . cephALEXin (KEFLEX) 500 MG capsule    Sig: Take 1 capsule (500 mg total) by mouth 2 (two) times daily.    Dispense:  14 capsule    Refill:  0

## 2015-03-25 DIAGNOSIS — N189 Chronic kidney disease, unspecified: Secondary | ICD-10-CM | POA: Diagnosis not present

## 2015-03-25 DIAGNOSIS — D649 Anemia, unspecified: Secondary | ICD-10-CM | POA: Diagnosis not present

## 2015-03-25 DIAGNOSIS — I4891 Unspecified atrial fibrillation: Secondary | ICD-10-CM | POA: Diagnosis not present

## 2015-03-25 DIAGNOSIS — C3411 Malignant neoplasm of upper lobe, right bronchus or lung: Secondary | ICD-10-CM | POA: Diagnosis not present

## 2015-03-25 DIAGNOSIS — E039 Hypothyroidism, unspecified: Secondary | ICD-10-CM | POA: Diagnosis not present

## 2015-03-25 DIAGNOSIS — I129 Hypertensive chronic kidney disease with stage 1 through stage 4 chronic kidney disease, or unspecified chronic kidney disease: Secondary | ICD-10-CM | POA: Diagnosis not present

## 2015-03-26 DIAGNOSIS — N189 Chronic kidney disease, unspecified: Secondary | ICD-10-CM | POA: Diagnosis not present

## 2015-03-26 DIAGNOSIS — E039 Hypothyroidism, unspecified: Secondary | ICD-10-CM | POA: Diagnosis not present

## 2015-03-26 DIAGNOSIS — I129 Hypertensive chronic kidney disease with stage 1 through stage 4 chronic kidney disease, or unspecified chronic kidney disease: Secondary | ICD-10-CM | POA: Diagnosis not present

## 2015-03-26 DIAGNOSIS — I4891 Unspecified atrial fibrillation: Secondary | ICD-10-CM | POA: Diagnosis not present

## 2015-03-26 DIAGNOSIS — C3411 Malignant neoplasm of upper lobe, right bronchus or lung: Secondary | ICD-10-CM | POA: Diagnosis not present

## 2015-03-26 DIAGNOSIS — D649 Anemia, unspecified: Secondary | ICD-10-CM | POA: Diagnosis not present

## 2015-03-27 ENCOUNTER — Other Ambulatory Visit: Payer: Self-pay | Admitting: *Deleted

## 2015-03-27 DIAGNOSIS — N189 Chronic kidney disease, unspecified: Secondary | ICD-10-CM | POA: Diagnosis not present

## 2015-03-27 DIAGNOSIS — R05 Cough: Secondary | ICD-10-CM

## 2015-03-27 DIAGNOSIS — E039 Hypothyroidism, unspecified: Secondary | ICD-10-CM | POA: Diagnosis not present

## 2015-03-27 DIAGNOSIS — R059 Cough, unspecified: Secondary | ICD-10-CM

## 2015-03-27 DIAGNOSIS — I4891 Unspecified atrial fibrillation: Secondary | ICD-10-CM | POA: Diagnosis not present

## 2015-03-27 DIAGNOSIS — C3411 Malignant neoplasm of upper lobe, right bronchus or lung: Secondary | ICD-10-CM

## 2015-03-27 DIAGNOSIS — I129 Hypertensive chronic kidney disease with stage 1 through stage 4 chronic kidney disease, or unspecified chronic kidney disease: Secondary | ICD-10-CM | POA: Diagnosis not present

## 2015-03-27 DIAGNOSIS — D649 Anemia, unspecified: Secondary | ICD-10-CM | POA: Diagnosis not present

## 2015-03-27 MED ORDER — GUAIFENESIN 100 MG/5ML PO SYRP: 100.0000 mg | ORAL_SOLUTION | Freq: Four times a day (QID) | ORAL | Status: DC | PRN

## 2015-03-27 NOTE — Progress Notes (Signed)
rcvd phone msg from [facility] with hospice 336 [phone] for RF on the robitussin. RX escribed to patient's pharmacy per v/o Dr. Rogue Bussing

## 2015-03-31 DIAGNOSIS — I4891 Unspecified atrial fibrillation: Secondary | ICD-10-CM | POA: Diagnosis not present

## 2015-03-31 DIAGNOSIS — I10 Essential (primary) hypertension: Secondary | ICD-10-CM | POA: Diagnosis not present

## 2015-03-31 DIAGNOSIS — R0602 Shortness of breath: Secondary | ICD-10-CM | POA: Diagnosis not present

## 2015-03-31 DIAGNOSIS — I429 Cardiomyopathy, unspecified: Secondary | ICD-10-CM | POA: Diagnosis not present

## 2015-03-31 DIAGNOSIS — I509 Heart failure, unspecified: Secondary | ICD-10-CM | POA: Diagnosis not present

## 2015-04-02 ENCOUNTER — Ambulatory Visit: Payer: Medicare Other | Admitting: Oncology

## 2015-04-02 ENCOUNTER — Other Ambulatory Visit: Payer: Medicare Other

## 2015-04-02 ENCOUNTER — Inpatient Hospital Stay: Payer: Medicare Other

## 2015-04-02 ENCOUNTER — Inpatient Hospital Stay (HOSPITAL_BASED_OUTPATIENT_CLINIC_OR_DEPARTMENT_OTHER): Payer: Medicare Other | Admitting: Internal Medicine

## 2015-04-02 VITALS — BP 112/64 | HR 91 | Temp 98.8°F | Resp 18 | Wt 134.9 lb

## 2015-04-02 DIAGNOSIS — C3411 Malignant neoplasm of upper lobe, right bronchus or lung: Secondary | ICD-10-CM

## 2015-04-02 DIAGNOSIS — Z79899 Other long term (current) drug therapy: Secondary | ICD-10-CM

## 2015-04-02 DIAGNOSIS — I129 Hypertensive chronic kidney disease with stage 1 through stage 4 chronic kidney disease, or unspecified chronic kidney disease: Secondary | ICD-10-CM | POA: Diagnosis not present

## 2015-04-02 DIAGNOSIS — D631 Anemia in chronic kidney disease: Secondary | ICD-10-CM | POA: Diagnosis not present

## 2015-04-02 DIAGNOSIS — E876 Hypokalemia: Secondary | ICD-10-CM

## 2015-04-02 DIAGNOSIS — N189 Chronic kidney disease, unspecified: Secondary | ICD-10-CM | POA: Diagnosis not present

## 2015-04-02 DIAGNOSIS — L739 Follicular disorder, unspecified: Secondary | ICD-10-CM

## 2015-04-02 DIAGNOSIS — D509 Iron deficiency anemia, unspecified: Secondary | ICD-10-CM

## 2015-04-02 DIAGNOSIS — Z5111 Encounter for antineoplastic chemotherapy: Secondary | ICD-10-CM | POA: Diagnosis not present

## 2015-04-02 LAB — COMPREHENSIVE METABOLIC PANEL
ALBUMIN: 2.7 g/dL — AB (ref 3.5–5.0)
ALT: 25 U/L (ref 14–54)
AST: 19 U/L (ref 15–41)
Alkaline Phosphatase: 78 U/L (ref 38–126)
Anion gap: 8 (ref 5–15)
BUN: 50 mg/dL — AB (ref 6–20)
CHLORIDE: 99 mmol/L — AB (ref 101–111)
CO2: 25 mmol/L (ref 22–32)
CREATININE: 1.95 mg/dL — AB (ref 0.44–1.00)
Calcium: 8.2 mg/dL — ABNORMAL LOW (ref 8.9–10.3)
GFR calc Af Amer: 28 mL/min — ABNORMAL LOW (ref >60)
GFR calc non Af Amer: 24 mL/min — ABNORMAL LOW (ref >60)
GLUCOSE: 266 mg/dL — AB (ref 65–99)
POTASSIUM: 3.9 mmol/L (ref 3.5–5.1)
SODIUM: 132 mmol/L — AB (ref 135–145)
Total Bilirubin: 0.8 mg/dL (ref 0.3–1.2)
Total Protein: 7.8 g/dL (ref 6.5–8.1)

## 2015-04-02 LAB — CBC WITH DIFFERENTIAL/PLATELET
BASOS ABS: 0 10*3/uL (ref 0–0.1)
BASOS PCT: 0 %
EOS PCT: 1 %
Eosinophils Absolute: 0.1 10*3/uL (ref 0–0.7)
HCT: 30.9 % — ABNORMAL LOW (ref 35.0–47.0)
Hemoglobin: 9.9 g/dL — ABNORMAL LOW (ref 12.0–16.0)
Lymphocytes Relative: 7 %
Lymphs Abs: 0.6 10*3/uL — ABNORMAL LOW (ref 1.0–3.6)
MCH: 28.6 pg (ref 26.0–34.0)
MCHC: 32 g/dL (ref 32.0–36.0)
MCV: 89.2 fL (ref 80.0–100.0)
MONO ABS: 0.6 10*3/uL (ref 0.2–0.9)
Monocytes Relative: 8 %
Neutro Abs: 7.1 10*3/uL — ABNORMAL HIGH (ref 1.4–6.5)
Neutrophils Relative %: 84 %
PLATELETS: 322 10*3/uL (ref 150–440)
RBC: 3.47 MIL/uL — AB (ref 3.80–5.20)
RDW: 22.1 % — AB (ref 11.5–14.5)
WBC: 8.4 10*3/uL (ref 3.6–11.0)

## 2015-04-02 NOTE — Progress Notes (Signed)
[place] OFFICE PROGRESS NOTE  Patient Care Team: Arnetha Courser, MD as PCP - General (Family Medicine) Manya Silvas, MD (Gastroenterology) Cammie Sickle, MD as Consulting Physician (Oncology) Lavonia Dana, MD as Consulting Physician (Nephrology)   SUMMARY OF ONCOLOGIC HISTORY:  # JAN 2013- RECURRENT/METASTATIC NON-SMALL CELL LUNG CA  [Right UL s/p Bronch; CT Bx- insuff] Carbo-taxol- RT   # JAN 2014- Pleural Bx- NEG/tacl pleurodesis; MARCH 2014- PET- Multifocal pleural activity ? Spread; April 2014- VERISTRAT- good; MAY 2014- Tarceva; July 2014- Tarceva Stopped sec to Declining PS- on hospice; OFF Hospice as pt is steady; CT June 2016- ? 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   Patient's appetite is steady. She is not losing any weight. no Diarrhea. She has mild shortness of breath/intermittent cough not any worse. No swelling in the legs. No nausea no vomiting.  As per family she is continuing spend most of the time resting /wheelchair for ambulation.   REVIEW OF SYSTEMS:  A complete 10 point review of system is done which is negative except mentioned above/history of present illness.   PAST MEDICAL HISTORY :  Past Medical History  Diagnosis Date  . Atrial fibrillation ([place])     Dr. Humphrey Rolls, cardiologist  . Anemia   . Hypothyroidism   . Lung cancer ([place])   . Hypertension   . Lung cancer ([place])   . Anemia     PAST SURGICAL HISTORY :    Past Surgical History  Procedure Laterality Date  . Upper gastrointestinal endoscopy  02/24/14  . Esophagogastroduodenoscopy N/A 10/10/2014    Procedure: ESOPHAGOGASTRODUODENOSCOPY (EGD);  Surgeon: Manya Silvas, MD;  Location: [facility] ENDOSCOPY;  Service: Endoscopy;  Laterality: N/A;  . Colonoscopy N/A 10/11/2014    Procedure: COLONOSCOPY;  Surgeon: Manya Silvas, MD;  Location: [facility] ENDOSCOPY;  Service: Endoscopy;  Laterality: N/A;    FAMILY HISTORY :   Family History  Problem Relation Age of Onset  . Stomach cancer Mother   . Cancer Mother     unsure where but states "it spead all over"  . Alzheimer's disease Father   . Varicose Veins Daughter   . Diabetes Neg Hx   . Heart disease Neg Hx   . Hypertension Neg Hx   . Stroke Neg Hx   . COPD Neg Hx     SOCIAL HISTORY:   Social History  Substance Use Topics  . Smoking status: Former Smoker -- 0.75 packs/day for 30 years    Types: Cigarettes    Quit date: 01/03/2005  . Smokeless tobacco: Never Used  . Alcohol Use: No    ALLERGIES:  has No Known Allergies.  MEDICATIONS:  Current Outpatient Prescriptions  Medication Sig Dispense Refill  . albuterol (PROVENTIL) (2.5 MG/3ML) 0.083% nebulizer solution Inhale 2.5 mg into the lungs.    . ALPRAZolam (XANAX) 0.25 MG tablet Take 1 tablet (0.25 mg total) by mouth at bedtime as needed for anxiety. 30 tablet 1  .  amiodarone (PACERONE) 200 MG tablet Take 200 mg by mouth daily.     Marland Kitchen atorvastatin (LIPITOR) 20 MG tablet Take 20 mg by mouth at bedtime.     . benzonatate (TESSALON) 100 MG capsule Take 100 mg by mouth 3 (three) times daily as needed for cough.    . carvedilol (COREG) 3.125 MG tablet Take 3.125 mg by mouth 2 (two) times daily with a meal.    . chlorpheniramine (ALLERGY) 4 MG tablet Take 4 mg by mouth. Reported on 01/02/2015    . Ferrous Sulfate 27 MG TABS Take 27 mg by mouth 3 (three) times daily.    Marland Kitchen gabapentin (NEURONTIN) 100 MG capsule Take 1 capsule by mouth two times  daily 180 capsule 1  . guaifenesin (ROBITUSSIN) 100 MG/5ML syrup Take 5 mLs (100 mg total) by mouth every 6 (six) hours as needed for cough (HOSPICE PATIENT). 120 mL 3  . isosorbide mononitrate (IMDUR) 30 MG 24 hr tablet Take 30 mg by mouth daily.    Marland Kitchen levothyroxine (SYNTHROID, LEVOTHROID) 50 MCG tablet Take 1 tablet (50 mcg total) by mouth daily. 90 tablet 0  . megestrol (MEGACE ES) 625 MG/5ML suspension Take 5 mLs (625 mg total) by mouth daily. 150 mL 3  . potassium chloride SA (K-DUR,KLOR-CON) 20 MEQ tablet Take 1 tablet by mouth two  times daily (Patient taking differently: TAKE 1 TABLET DAILY) 180 tablet 0  . predniSONE (DELTASONE) 20 MG tablet Take 1 tablet (20 mg total) by mouth daily with breakfast. 7 tablet 0  . torsemide (DEMADEX) 10 MG tablet Take 1 tablet (10 mg total) by mouth daily. 90 tablet 0   No current facility-administered medications for this visit.   Facility-Administered Medications Ordered in Other Visits  Medication Dose Route Frequency Provider Last Rate Last Dose  . 0.9 %  sodium chloride infusion   Intravenous Continuous Leia Alf, MD 20 mL/hr at 07/04/14 1150    . heparin lock flush 100 unit/mL  500 Units Intravenous Once Leia Alf, MD      . heparin lock flush 100 unit/mL  500 Units Intravenous Once Leia Alf, MD      . sodium chloride 0.9 % injection 10 mL  10 mL Intravenous PRN Leia Alf, MD      . sodium chloride 0.9 % injection 10 mL  10 mL Intravenous PRN Leia Alf, MD   10 mL at 07/04/14 1145    PHYSICAL EXAMINATION: ECOG PERFORMANCE STATUS: 3 - Symptomatic, >50% confined to bed  BP 112/64 mmHg  Pulse 91  Temp(Src) 98.8 F (37.1 C) (Tympanic)  Resp 18  Wt 134 lb 14.7 oz (61.2 kg)  Filed Weights   04/02/15 1507  Weight: 134 lb 14.7 oz (61.2 kg)    GENERAL:  Alert, no distress and comfortable. Thin built/moderately nourished  In a wheel chair;  She is accompanied by husband/daughter/son.  EYES: no pallor or  icterus OROPHARYNX: no thrush or ulceration; poor dentition  NECK: supple, no masses felt LYMPH:  no palpable lymphadenopathy in the cervical, axillary or inguinal regions LUNGS: Decreased breath sounds bilaterally at the bases. Scattered wheeze noted or crackles HEART/CVS: regular rate & rhythm and no murmurs; No lower extremity edema ABDOMEN:abdomen soft, non-tender and normal bowel sounds Musculoskeletal:no cyanosis of digits and no clubbing  PSYCH: alert & oriented x 3 with fluent speech NEURO: no focal motor/sensory deficits SKIN:  no rashes or significant lesions;  Right suprapubic area- area of folliculitis.   LABORATORY DATA:  I have  reviewed the data as listed    Component Value Date/Time   NA 132* 04/02/2015 1413   NA 136 08/19/2014 1337   NA 135* 02/08/2014 0844   K 3.9 04/02/2015 1413   K 4.0 02/08/2014 0844   CL 99* 04/02/2015 1413   CL 105 02/08/2014 0844   CO2 25 04/02/2015 1413   CO2 21 02/08/2014 0844   GLUCOSE 266* 04/02/2015 1413   GLUCOSE 136* 08/19/2014 1337   GLUCOSE 157* 02/08/2014 0844   BUN 50* 04/02/2015 1413   BUN 32* 08/19/2014 1337   BUN 15 02/08/2014 0844   CREATININE 1.95* 04/02/2015 1413   CREATININE 1.36* 02/08/2014 0844   CALCIUM 8.2* 04/02/2015 1413   CALCIUM 8.8 02/08/2014 0844   PROT 7.8 04/02/2015 1413   PROT 7.9 12/06/2013 1453   ALBUMIN 2.7* 04/02/2015 1413   ALBUMIN 2.8* 12/06/2013 1453   AST 19 04/02/2015 1413   AST 16 12/06/2013 1453   ALT 25 04/02/2015 1413   ALT 12* 12/06/2013 1453   ALKPHOS 78 04/02/2015 1413   ALKPHOS 82 12/06/2013 1453   BILITOT 0.8 04/02/2015 1413   BILITOT 0.3 12/06/2013 1453   GFRNONAA 24* 04/02/2015 1413   GFRNONAA 41* 02/08/2014 0844   GFRNONAA 32* 07/30/2013 1508   GFRAA 28* 04/02/2015 1413   GFRAA 49* 02/08/2014 0844   GFRAA 37* 07/30/2013 1508    No results found for: SPEP, UPEP  Lab Results  Component Value Date   WBC 8.4 04/02/2015   NEUTROABS 7.1* 04/02/2015   HGB 9.9* 04/02/2015    HCT 30.9* 04/02/2015   MCV 89.2 04/02/2015   PLT 322 04/02/2015      Chemistry      Component Value Date/Time   NA 132* 04/02/2015 1413   NA 136 08/19/2014 1337   NA 135* 02/08/2014 0844   K 3.9 04/02/2015 1413   K 4.0 02/08/2014 0844   CL 99* 04/02/2015 1413   CL 105 02/08/2014 0844   CO2 25 04/02/2015 1413   CO2 21 02/08/2014 0844   BUN 50* 04/02/2015 1413   BUN 32* 08/19/2014 1337   BUN 15 02/08/2014 0844   CREATININE 1.95* 04/02/2015 1413   CREATININE 1.36* 02/08/2014 0844      Component Value Date/Time   CALCIUM 8.2* 04/02/2015 1413   CALCIUM 8.8 02/08/2014 0844   ALKPHOS 78 04/02/2015 1413   ALKPHOS 82 12/06/2013 1453   AST 19 04/02/2015 1413   AST 16 12/06/2013 1453   ALT 25 04/02/2015 1413   ALT 12* 12/06/2013 1453   BILITOT 0.8 04/02/2015 1413   BILITOT 0.3 12/06/2013 1453        ASSESSMENT & PLAN:   # LUNG CA- non-small cell lung stage IV; on Opdivo every 2 weeks. Patient tolerated treatment cycle #4  With mild to moderate difficulties. I reviewed the PET scan with the patient's family; shows the right lower lobe lesion improving in size [currently 2.2 x 2.5 from 3.5 x3cm]; and the right lung continues to show extensive radiation changes.  # Given the continued decline in performance status/worsening kidney function- creatinine anywhere between 1.8-2. Patient's Opdivo is on hold. Patient is currently in hospice. Continue hospice.  # chronic kidney disease with Creatinine stable around 1.8-1.9- patient appointment with Dr.Kolluru In the next few weeks.  # Ongoing cough- scattered wheezing heard- s/p prednisone/breathing treatments- improved.   # folliculitis- s/p Kelfex. awaiting Gyn eval as per PCP.   # anemia hemoglobin 9.6-  likely from chronic renal insufficiency/iron deficiency-secondary to  malignancy/hold off Procrit at this time. Continue iron 3 times a day.  # Patient follow-up with me in 4 weeks/labs/no treatment.   # 25 minutes face-to-face  with the patient discussing the above plan of care; more than 50% of time spent on natural history; counseling and coordination.     Cammie Sickle, MD 04/02/2015 3:27 PM

## 2015-04-03 DIAGNOSIS — I129 Hypertensive chronic kidney disease with stage 1 through stage 4 chronic kidney disease, or unspecified chronic kidney disease: Secondary | ICD-10-CM | POA: Diagnosis not present

## 2015-04-03 DIAGNOSIS — D649 Anemia, unspecified: Secondary | ICD-10-CM | POA: Diagnosis not present

## 2015-04-03 DIAGNOSIS — C3411 Malignant neoplasm of upper lobe, right bronchus or lung: Secondary | ICD-10-CM | POA: Diagnosis not present

## 2015-04-03 DIAGNOSIS — I4891 Unspecified atrial fibrillation: Secondary | ICD-10-CM | POA: Diagnosis not present

## 2015-04-03 DIAGNOSIS — E039 Hypothyroidism, unspecified: Secondary | ICD-10-CM | POA: Diagnosis not present

## 2015-04-03 DIAGNOSIS — N189 Chronic kidney disease, unspecified: Secondary | ICD-10-CM | POA: Diagnosis not present

## 2015-04-04 DIAGNOSIS — N189 Chronic kidney disease, unspecified: Secondary | ICD-10-CM | POA: Diagnosis not present

## 2015-04-04 DIAGNOSIS — E039 Hypothyroidism, unspecified: Secondary | ICD-10-CM | POA: Diagnosis not present

## 2015-04-04 DIAGNOSIS — I4891 Unspecified atrial fibrillation: Secondary | ICD-10-CM | POA: Diagnosis not present

## 2015-04-04 DIAGNOSIS — D649 Anemia, unspecified: Secondary | ICD-10-CM | POA: Diagnosis not present

## 2015-04-04 DIAGNOSIS — I129 Hypertensive chronic kidney disease with stage 1 through stage 4 chronic kidney disease, or unspecified chronic kidney disease: Secondary | ICD-10-CM | POA: Diagnosis not present

## 2015-04-04 DIAGNOSIS — C3411 Malignant neoplasm of upper lobe, right bronchus or lung: Secondary | ICD-10-CM | POA: Diagnosis not present

## 2015-04-04 DIAGNOSIS — Z87891 Personal history of nicotine dependence: Secondary | ICD-10-CM | POA: Diagnosis not present

## 2015-04-06 DIAGNOSIS — D631 Anemia in chronic kidney disease: Secondary | ICD-10-CM | POA: Diagnosis not present

## 2015-04-06 DIAGNOSIS — N2581 Secondary hyperparathyroidism of renal origin: Secondary | ICD-10-CM | POA: Diagnosis not present

## 2015-04-06 DIAGNOSIS — I129 Hypertensive chronic kidney disease with stage 1 through stage 4 chronic kidney disease, or unspecified chronic kidney disease: Secondary | ICD-10-CM | POA: Diagnosis not present

## 2015-04-06 DIAGNOSIS — N184 Chronic kidney disease, stage 4 (severe): Secondary | ICD-10-CM | POA: Diagnosis not present

## 2015-04-08 ENCOUNTER — Ambulatory Visit: Payer: Medicare Other | Admitting: Family Medicine

## 2015-04-10 DIAGNOSIS — I4891 Unspecified atrial fibrillation: Secondary | ICD-10-CM | POA: Diagnosis not present

## 2015-04-10 DIAGNOSIS — N189 Chronic kidney disease, unspecified: Secondary | ICD-10-CM | POA: Diagnosis not present

## 2015-04-10 DIAGNOSIS — I129 Hypertensive chronic kidney disease with stage 1 through stage 4 chronic kidney disease, or unspecified chronic kidney disease: Secondary | ICD-10-CM | POA: Diagnosis not present

## 2015-04-10 DIAGNOSIS — D649 Anemia, unspecified: Secondary | ICD-10-CM | POA: Diagnosis not present

## 2015-04-10 DIAGNOSIS — C3411 Malignant neoplasm of upper lobe, right bronchus or lung: Secondary | ICD-10-CM | POA: Diagnosis not present

## 2015-04-10 DIAGNOSIS — E039 Hypothyroidism, unspecified: Secondary | ICD-10-CM | POA: Diagnosis not present

## 2015-04-13 ENCOUNTER — Ambulatory Visit (INDEPENDENT_AMBULATORY_CARE_PROVIDER_SITE_OTHER): Admitting: Family Medicine

## 2015-04-13 ENCOUNTER — Other Ambulatory Visit: Payer: Self-pay | Admitting: Family Medicine

## 2015-04-13 ENCOUNTER — Encounter: Payer: Self-pay | Admitting: Family Medicine

## 2015-04-13 VITALS — BP 102/58 | HR 88 | Temp 98.2°F | Resp 16 | Wt 137.0 lb

## 2015-04-13 DIAGNOSIS — D509 Iron deficiency anemia, unspecified: Secondary | ICD-10-CM

## 2015-04-13 DIAGNOSIS — C3411 Malignant neoplasm of upper lobe, right bronchus or lung: Secondary | ICD-10-CM | POA: Diagnosis not present

## 2015-04-13 DIAGNOSIS — N189 Chronic kidney disease, unspecified: Secondary | ICD-10-CM | POA: Diagnosis not present

## 2015-04-13 DIAGNOSIS — I480 Paroxysmal atrial fibrillation: Secondary | ICD-10-CM | POA: Diagnosis not present

## 2015-04-13 DIAGNOSIS — I4891 Unspecified atrial fibrillation: Secondary | ICD-10-CM | POA: Diagnosis not present

## 2015-04-13 DIAGNOSIS — N184 Chronic kidney disease, stage 4 (severe): Secondary | ICD-10-CM

## 2015-04-13 DIAGNOSIS — I10 Essential (primary) hypertension: Secondary | ICD-10-CM | POA: Diagnosis not present

## 2015-04-13 DIAGNOSIS — I129 Hypertensive chronic kidney disease with stage 1 through stage 4 chronic kidney disease, or unspecified chronic kidney disease: Secondary | ICD-10-CM | POA: Diagnosis not present

## 2015-04-13 DIAGNOSIS — E039 Hypothyroidism, unspecified: Secondary | ICD-10-CM | POA: Diagnosis not present

## 2015-04-13 DIAGNOSIS — E038 Other specified hypothyroidism: Secondary | ICD-10-CM

## 2015-04-13 DIAGNOSIS — D649 Anemia, unspecified: Secondary | ICD-10-CM | POA: Diagnosis not present

## 2015-04-13 NOTE — Telephone Encounter (Signed)
Pt has appt today; will get labs first before sending Rx in case dose changes

## 2015-04-13 NOTE — Patient Instructions (Addendum)
Please ask the lab to have thyroid test done when you go in around April 27th Have the Hospice nurse call me about any concerns, and ask her to check that place again this week Okay to cancel the appt with the gynecologist if that place is gone

## 2015-04-13 NOTE — Progress Notes (Signed)
BP 102/58 mmHg  Pulse 88  Temp(Src) 98.2 F (36.8 C) (Oral)  Resp 16  Wt 137 lb (62.143 kg)  SpO2 97%   Subjective:    Patient ID: Katrina Kramer, female    DOB: 1940/08/03, 75 y.o.   MRN: [id number]  HPI: Katrina Kramer is a 75 y.o. female  Chief Complaint  Patient presents with  . Medication Refill  . Hypertension  . Hypothyroidism  . Hyperlipidemia  . Gastroesophageal Reflux  . Anxiety   Patient is well-known to me; has lung cancer, thyroid disease, anemia, stage 3-4 CKD; here with her husband  She went to the cancer doctor recently, Dr. Jacinto Reap; appetite okay, energy level is fair  Hypothyroid; bowels moving okay; had a little upset stomach helped by alka-seltzer; no hair loss, no dry skin  She had blood and urine done at the kidney doctor and she goes back on May 1st; making good urine, no blood in the urine; staying hydrated; avoiding NSAIDs  The abscess on her abdomen/mons pubis resolved; has home health nurse who checked it; did not see gyn for this  Relevant past medical, surgical, family and social history reviewed and updated as indicated. Interim medical history since our last visit reviewed. Allergies and medications reviewed and updated.  Review of Systems Per HPI unless specifically indicated above     Objective:    BP 102/58 mmHg  Pulse 88  Temp(Src) 98.2 F (36.8 C) (Oral)  Resp 16  Wt 137 lb (62.143 kg)  SpO2 97%  Wt Readings from Last 3 Encounters:  05/01/15 138 lb (62.596 kg)  04/30/15 137 lb 12.6 oz (62.5 kg)  04/21/15 147 lb (66.679 kg)    Physical Exam  Constitutional: No distress.  Elderly female seated in wheelchair, gait not assessed; appears frail and chronically ill  HENT:  Head: Normocephalic and atraumatic.  Mouth/Throat: No oropharyngeal exudate.  Eyes: EOM are normal. Right eye exhibits no discharge. Left eye exhibits no discharge. No scleral icterus.  Neck: No JVD present. No tracheal deviation present. No thyromegaly present.    Cardiovascular: Normal rate and regular rhythm.   Pulmonary/Chest: Effort normal and breath sounds normal. She has no wheezes. She has no rales.  Abdominal: She exhibits no distension.  Neurological: She is alert.  Skin: Skin is warm. No pallor.  Patient declined exam today of previous carbuncle over mons pubis  Psychiatric: She has a normal mood and affect.    Results for orders placed or performed in visit on 04/02/15  CBC with Differential/Platelet  Result Value Ref Range   WBC 8.4 3.6 - 11.0 K/uL   RBC 3.47 (L) 3.80 - 5.20 MIL/uL   Hemoglobin 9.9 (L) 12.0 - 16.0 g/dL   HCT 30.9 (L) 35.0 - 47.0 %   MCV 89.2 80.0 - 100.0 fL   MCH 28.6 26.0 - 34.0 pg   MCHC 32.0 32.0 - 36.0 g/dL   RDW 22.1 (H) 11.5 - 14.5 %   Platelets 322 150 - 440 K/uL   Neutrophils Relative % 84 %   Neutro Abs 7.1 (H) 1.4 - 6.5 K/uL   Lymphocytes Relative 7 %   Lymphs Abs 0.6 (L) 1.0 - 3.6 K/uL   Monocytes Relative 8 %   Monocytes Absolute 0.6 0.2 - 0.9 K/uL   Eosinophils Relative 1 %   Eosinophils Absolute 0.1 0 - 0.7 K/uL   Basophils Relative 0 %   Basophils Absolute 0.0 0 - 0.1 K/uL  Comprehensive metabolic panel  Result Value Ref Range   Sodium 132 (L) 135 - 145 mmol/L   Potassium 3.9 3.5 - 5.1 mmol/L   Chloride 99 (L) 101 - 111 mmol/L   CO2 25 22 - 32 mmol/L   Glucose, Bld 266 (H) 65 - 99 mg/dL   BUN 50 (H) 6 - 20 mg/dL   Creatinine, Ser 1.95 (H) 0.44 - 1.00 mg/dL   Calcium 8.2 (L) 8.9 - 10.3 mg/dL   Total Protein 7.8 6.5 - 8.1 g/dL   Albumin 2.7 (L) 3.5 - 5.0 g/dL   AST 19 15 - 41 U/L   ALT 25 14 - 54 U/L   Alkaline Phosphatase 78 38 - 126 U/L   Total Bilirubin 0.8 0.3 - 1.2 mg/dL   GFR calc non Af Amer 24 (L) >60 mL/min   GFR calc Af Amer 28 (L) >60 mL/min   Anion gap 8 5 - 15      Assessment & Plan:   Problem List Items Addressed This Visit      Cardiovascular and Mediastinum   Atrial fibrillation ([place]) - Primary    Followed by Dr. Humphrey Rolls; sounds to be in NSR today       Hypertension    Controlled today        Respiratory   Lung cancer [facility])    Managed by oncologist at the cancer center        Endocrine   Hypothyroidism    Monitor thyroid; sounds to be euthyroid at current dose        Genitourinary   CKD (chronic kidney disease) stage 4, GFR 15-29 ml/min (HCC)    Avoid NSAIDs; managed by nephrologist        Other   IDA (iron deficiency anemia)    Being monitored by oncologist         Follow up plan: Return 2-3 months, for follow-up.  An after-visit summary was printed and given to the patient at [place].  Please see the patient instructions which may contain other information and recommendations beyond what is mentioned above in the assessment and plan.

## 2015-04-14 ENCOUNTER — Encounter: Payer: Medicare Other | Admitting: Obstetrics and Gynecology

## 2015-04-14 DIAGNOSIS — N189 Chronic kidney disease, unspecified: Secondary | ICD-10-CM | POA: Diagnosis not present

## 2015-04-14 DIAGNOSIS — I4891 Unspecified atrial fibrillation: Secondary | ICD-10-CM | POA: Diagnosis not present

## 2015-04-14 DIAGNOSIS — E039 Hypothyroidism, unspecified: Secondary | ICD-10-CM | POA: Diagnosis not present

## 2015-04-14 DIAGNOSIS — D649 Anemia, unspecified: Secondary | ICD-10-CM | POA: Diagnosis not present

## 2015-04-14 DIAGNOSIS — I129 Hypertensive chronic kidney disease with stage 1 through stage 4 chronic kidney disease, or unspecified chronic kidney disease: Secondary | ICD-10-CM | POA: Diagnosis not present

## 2015-04-14 DIAGNOSIS — C3411 Malignant neoplasm of upper lobe, right bronchus or lung: Secondary | ICD-10-CM | POA: Diagnosis not present

## 2015-04-16 DIAGNOSIS — E039 Hypothyroidism, unspecified: Secondary | ICD-10-CM | POA: Diagnosis not present

## 2015-04-16 DIAGNOSIS — D649 Anemia, unspecified: Secondary | ICD-10-CM | POA: Diagnosis not present

## 2015-04-16 DIAGNOSIS — C3411 Malignant neoplasm of upper lobe, right bronchus or lung: Secondary | ICD-10-CM | POA: Diagnosis not present

## 2015-04-16 DIAGNOSIS — I129 Hypertensive chronic kidney disease with stage 1 through stage 4 chronic kidney disease, or unspecified chronic kidney disease: Secondary | ICD-10-CM | POA: Diagnosis not present

## 2015-04-16 DIAGNOSIS — N189 Chronic kidney disease, unspecified: Secondary | ICD-10-CM | POA: Diagnosis not present

## 2015-04-16 DIAGNOSIS — I4891 Unspecified atrial fibrillation: Secondary | ICD-10-CM | POA: Diagnosis not present

## 2015-04-21 ENCOUNTER — Inpatient Hospital Stay
Admission: EM | Admit: 2015-04-21 | Discharge: 2015-04-22 | DRG: 379 | Disposition: A | Discharge status: Hospice | Attending: Internal Medicine | Admitting: Internal Medicine

## 2015-04-21 ENCOUNTER — Encounter: Payer: Self-pay | Admitting: Emergency Medicine

## 2015-04-21 DIAGNOSIS — N189 Chronic kidney disease, unspecified: Secondary | ICD-10-CM | POA: Diagnosis present

## 2015-04-21 DIAGNOSIS — Z923 Personal history of irradiation: Secondary | ICD-10-CM | POA: Diagnosis not present

## 2015-04-21 DIAGNOSIS — Z85118 Personal history of other malignant neoplasm of bronchus and lung: Secondary | ICD-10-CM | POA: Diagnosis not present

## 2015-04-21 DIAGNOSIS — Z7951 Long term (current) use of inhaled steroids: Secondary | ICD-10-CM | POA: Diagnosis not present

## 2015-04-21 DIAGNOSIS — I129 Hypertensive chronic kidney disease with stage 1 through stage 4 chronic kidney disease, or unspecified chronic kidney disease: Secondary | ICD-10-CM | POA: Diagnosis present

## 2015-04-21 DIAGNOSIS — E039 Hypothyroidism, unspecified: Secondary | ICD-10-CM | POA: Diagnosis present

## 2015-04-21 DIAGNOSIS — I4581 Long QT syndrome: Secondary | ICD-10-CM | POA: Diagnosis present

## 2015-04-21 DIAGNOSIS — I4891 Unspecified atrial fibrillation: Secondary | ICD-10-CM | POA: Diagnosis not present

## 2015-04-21 DIAGNOSIS — W19XXXA Unspecified fall, initial encounter: Secondary | ICD-10-CM | POA: Diagnosis present

## 2015-04-21 DIAGNOSIS — K922 Gastrointestinal hemorrhage, unspecified: Principal | ICD-10-CM | POA: Diagnosis present

## 2015-04-21 DIAGNOSIS — E785 Hyperlipidemia, unspecified: Secondary | ICD-10-CM | POA: Diagnosis present

## 2015-04-21 DIAGNOSIS — Z7982 Long term (current) use of aspirin: Secondary | ICD-10-CM | POA: Diagnosis not present

## 2015-04-21 DIAGNOSIS — D638 Anemia in other chronic diseases classified elsewhere: Secondary | ICD-10-CM | POA: Diagnosis present

## 2015-04-21 DIAGNOSIS — Z79899 Other long term (current) drug therapy: Secondary | ICD-10-CM

## 2015-04-21 DIAGNOSIS — D509 Iron deficiency anemia, unspecified: Secondary | ICD-10-CM | POA: Diagnosis not present

## 2015-04-21 DIAGNOSIS — Z9981 Dependence on supplemental oxygen: Secondary | ICD-10-CM

## 2015-04-21 DIAGNOSIS — Z9181 History of falling: Secondary | ICD-10-CM

## 2015-04-21 DIAGNOSIS — D649 Anemia, unspecified: Secondary | ICD-10-CM | POA: Diagnosis not present

## 2015-04-21 DIAGNOSIS — Z820 Family history of epilepsy and other diseases of the nervous system: Secondary | ICD-10-CM | POA: Diagnosis not present

## 2015-04-21 DIAGNOSIS — Y92009 Unspecified place in unspecified non-institutional (private) residence as the place of occurrence of the external cause: Secondary | ICD-10-CM | POA: Diagnosis not present

## 2015-04-21 DIAGNOSIS — Z87891 Personal history of nicotine dependence: Secondary | ICD-10-CM | POA: Diagnosis not present

## 2015-04-21 DIAGNOSIS — I482 Chronic atrial fibrillation: Secondary | ICD-10-CM | POA: Diagnosis present

## 2015-04-21 DIAGNOSIS — Z800 Family history of malignant neoplasm of digestive organs: Secondary | ICD-10-CM

## 2015-04-21 DIAGNOSIS — C3411 Malignant neoplasm of upper lobe, right bronchus or lung: Secondary | ICD-10-CM | POA: Diagnosis not present

## 2015-04-21 LAB — URINALYSIS COMPLETE WITH MICROSCOPIC (ARMC ONLY)
BACTERIA UA: NONE SEEN
Bilirubin Urine: NEGATIVE
Glucose, UA: NEGATIVE mg/dL
Hgb urine dipstick: NEGATIVE
Ketones, ur: NEGATIVE mg/dL
Leukocytes, UA: NEGATIVE
Nitrite: NEGATIVE
PH: 7 (ref 5.0–8.0)
PROTEIN: NEGATIVE mg/dL
RBC / HPF: NONE SEEN RBC/hpf (ref 0–5)
Specific Gravity, Urine: 1.005 (ref 1.005–1.030)

## 2015-04-21 LAB — BASIC METABOLIC PANEL
Anion gap: 8 (ref 5–15)
BUN: 48 mg/dL — AB (ref 6–20)
CHLORIDE: 106 mmol/L (ref 101–111)
CO2: 22 mmol/L (ref 22–32)
CREATININE: 1.78 mg/dL — AB (ref 0.44–1.00)
Calcium: 8.4 mg/dL — ABNORMAL LOW (ref 8.9–10.3)
GFR calc Af Amer: 31 mL/min — ABNORMAL LOW (ref >60)
GFR calc non Af Amer: 27 mL/min — ABNORMAL LOW (ref >60)
GLUCOSE: 144 mg/dL — AB (ref 65–99)
POTASSIUM: 3.8 mmol/L (ref 3.5–5.1)
Sodium: 136 mmol/L (ref 135–145)

## 2015-04-21 LAB — IRON AND TIBC
IRON: 22 ug/dL — AB (ref 28–170)
SATURATION RATIOS: 9 % — AB (ref 10.4–31.8)
TIBC: 247 ug/dL — ABNORMAL LOW (ref 250–450)
UIBC: 225 ug/dL

## 2015-04-21 LAB — CBC
HEMATOCRIT: 24.3 % — AB (ref 35.0–47.0)
HEMOGLOBIN: 7.9 g/dL — AB (ref 12.0–16.0)
MCH: 28.4 pg (ref 26.0–34.0)
MCHC: 32.4 g/dL (ref 32.0–36.0)
MCV: 87.6 fL (ref 80.0–100.0)
Platelets: 376 10*3/uL (ref 150–440)
RBC: 2.77 MIL/uL — AB (ref 3.80–5.20)
RDW: 20.8 % — ABNORMAL HIGH (ref 11.5–14.5)
WBC: 5.7 10*3/uL (ref 3.6–11.0)

## 2015-04-21 LAB — VITAMIN B12: Vitamin B-12: 442 pg/mL (ref 180–914)

## 2015-04-21 LAB — HEMOGLOBIN: Hemoglobin: 8.6 g/dL — ABNORMAL LOW (ref 12.0–16.0)

## 2015-04-21 LAB — PREPARE RBC (CROSSMATCH)

## 2015-04-21 MED ORDER — MEGESTROL ACETATE 400 MG/10ML PO SUSP
400.0000 mg | Freq: Every day | ORAL | Status: DC
  Administered 2015-04-21 – 2015-04-22 (×2): 400 mg via ORAL
  Filled 2015-04-21 (×2): qty 10

## 2015-04-21 MED ORDER — ONDANSETRON HCL 4 MG/2ML IJ SOLN: 4.0000 mg | Freq: Four times a day (QID) | INTRAMUSCULAR | Status: DC | PRN

## 2015-04-21 MED ORDER — BENZONATATE 100 MG PO CAPS: 100.0000 mg | ORAL_CAPSULE | Freq: Three times a day (TID) | ORAL | Status: DC | PRN

## 2015-04-21 MED ORDER — LEVOTHYROXINE SODIUM 50 MCG PO TABS
50.0000 ug | ORAL_TABLET | Freq: Every day | ORAL | Status: DC
  Administered 2015-04-21 – 2015-04-22 (×2): 50 ug via ORAL
  Filled 2015-04-21 (×2): qty 1

## 2015-04-21 MED ORDER — ONDANSETRON HCL 4 MG PO TABS: 4.0000 mg | ORAL_TABLET | Freq: Four times a day (QID) | ORAL | Status: DC | PRN

## 2015-04-21 MED ORDER — ALPRAZOLAM 0.25 MG PO TABS: 0.2500 mg | ORAL_TABLET | Freq: Every evening | ORAL | Status: DC | PRN

## 2015-04-21 MED ORDER — ISOSORBIDE MONONITRATE ER 60 MG PO TB24
30.0000 mg | ORAL_TABLET | Freq: Every day | ORAL | Status: DC
  Administered 2015-04-21 – 2015-04-22 (×2): 30 mg via ORAL
  Filled 2015-04-21 (×2): qty 1

## 2015-04-21 MED ORDER — ACETAMINOPHEN 650 MG RE SUPP: 650.0000 mg | Freq: Four times a day (QID) | RECTAL | Status: DC | PRN

## 2015-04-21 MED ORDER — FERROUS SULFATE 325 (65 FE) MG PO TABS
325.0000 mg | ORAL_TABLET | Freq: Three times a day (TID) | ORAL | Status: DC
  Administered 2015-04-21 – 2015-04-22 (×4): 325 mg via ORAL
  Filled 2015-04-21 (×4): qty 1

## 2015-04-21 MED ORDER — BISACODYL 10 MG RE SUPP: 10.0000 mg | Freq: Every day | RECTAL | Status: DC | PRN

## 2015-04-21 MED ORDER — ALBUTEROL SULFATE (2.5 MG/3ML) 0.083% IN NEBU: 2.5000 mg | INHALATION_SOLUTION | RESPIRATORY_TRACT | Status: DC | PRN

## 2015-04-21 MED ORDER — ATORVASTATIN CALCIUM 20 MG PO TABS
20.0000 mg | ORAL_TABLET | Freq: Every day | ORAL | Status: DC
  Administered 2015-04-21: 20 mg via ORAL
  Filled 2015-04-21: qty 1

## 2015-04-21 MED ORDER — SODIUM CHLORIDE 0.9 % IV SOLN
10.0000 mL/h | Freq: Once | INTRAVENOUS | Status: AC
  Administered 2015-04-21: 12:00:00 10 mL/h via INTRAVENOUS

## 2015-04-21 MED ORDER — ACETAMINOPHEN 325 MG PO TABS: 650.0000 mg | ORAL_TABLET | Freq: Four times a day (QID) | ORAL | Status: DC | PRN

## 2015-04-21 MED ORDER — GABAPENTIN 100 MG PO CAPS
100.0000 mg | ORAL_CAPSULE | Freq: Two times a day (BID) | ORAL | Status: DC
  Administered 2015-04-21 – 2015-04-22 (×3): 100 mg via ORAL
  Filled 2015-04-21 (×3): qty 1

## 2015-04-21 MED ORDER — POTASSIUM CHLORIDE CRYS ER 20 MEQ PO TBCR
20.0000 meq | EXTENDED_RELEASE_TABLET | Freq: Every day | ORAL | Status: DC
  Administered 2015-04-21 – 2015-04-22 (×2): 20 meq via ORAL
  Filled 2015-04-21 (×2): qty 1

## 2015-04-21 MED ORDER — SENNOSIDES-DOCUSATE SODIUM 8.6-50 MG PO TABS: 1.0000 | ORAL_TABLET | Freq: Every evening | ORAL | Status: DC | PRN

## 2015-04-21 MED ORDER — CARVEDILOL 6.25 MG PO TABS
6.2500 mg | ORAL_TABLET | Freq: Two times a day (BID) | ORAL | Status: DC
  Administered 2015-04-22: 6.25 mg via ORAL
  Filled 2015-04-21: qty 1

## 2015-04-21 MED ORDER — PANTOPRAZOLE SODIUM 40 MG PO TBEC
40.0000 mg | DELAYED_RELEASE_TABLET | Freq: Every day | ORAL | Status: DC
  Administered 2015-04-21 – 2015-04-22 (×2): 40 mg via ORAL
  Filled 2015-04-21 (×2): qty 1

## 2015-04-21 MED ORDER — AMIODARONE HCL 200 MG PO TABS
200.0000 mg | ORAL_TABLET | Freq: Every day | ORAL | Status: DC
  Administered 2015-04-21 – 2015-04-22 (×2): 200 mg via ORAL
  Filled 2015-04-21 (×2): qty 1

## 2015-04-21 NOTE — ED Notes (Signed)
Pt presents to ED via EMS from personal home with c/o of weakness and x3 fall occurrences since today. EMS states pt hypotensive episode of 80/40. Pt has hx of lung cancer and atrial fibrillation. EMS reports no injuries from fall episodes. Pt reports weakness beginning since yesterday. Pt alert and oriented x4. Pt given 350 mL of NS in route to ER, recent BP 114/76.

## 2015-04-21 NOTE — ED Notes (Signed)
Report called to [facility] on 1C

## 2015-04-21 NOTE — Progress Notes (Signed)
PT Cancellation Note  Patient Details Name: Katrina Kramer MRN: [id number] DOB: 1940/09/18   Cancelled Treatment:    Reason Eval/Treat Not Completed: Other (comment).  Nursing reporting pt with low BP today and recommending holding PT.  Will re-attempt PT eval at a later date/time.   [name] 04/21/2015, 4:18 PM [name], [place]

## 2015-04-21 NOTE — ED Provider Notes (Addendum)
[facility] Emergency Department Provider Note  ____________________________________________  Time seen: Approximately 7:35 AM  I have reviewed the triage vital signs and the nursing notes.   HISTORY  Chief Complaint Weakness and Fall   HPI Katrina Kramer is a 75 y.o. female with a history of lung cancer GI bleed who is presenting to the emergency department today with weakness over the past day as well as falls 3 this morning. She says that she was trying to get up out of bed and felt like her legs were giving out and she was going to pass out. She says she then went to the ground and fell on her bottom but did not hit her head and did not lose consciousness. She says that she feels weak all over. She denies any cough, fever or burning with urination. She says that she has been anemic in the past and has needed blood transfusions. She says that she does take her iron 3 times a day. Has not noted any blood in her stool recently. However, says in the past had a "bleeding ulcer" that blood was Xarelto. She has since been taken off blood thinners. Denies any pain at this time.   Past Medical History  Diagnosis Date  . Atrial fibrillation ([place])     Dr. Humphrey Rolls, cardiologist  . Anemia   . Hypothyroidism   . Lung cancer ([place])   . Hypertension   . Lung cancer ([place])   . Anemia     Patient Active Problem List   Diagnosis Date Noted  . Carbuncle, groin 03/04/2015  . Hypokalemia 01/22/2015  . CKD (chronic kidney disease) stage 3, GFR 30-59 ml/min 01/05/2015  . Abnormal weight loss 01/05/2015  . Acid reflux 08/31/2014  . Atrial fibrillation ([place])   . Anemia   . Hypothyroidism   . Lung cancer ([place])   . Hypertension   . IDA (iron deficiency anemia) 07/04/2014  . H/O upper gastrointestinal hemorrhage 03/26/2014  . CAFL (chronic airflow limitation) ([place]) 09/24/2013    Past Surgical History  Procedure Laterality Date  . Upper gastrointestinal endoscopy  02/24/14   . Esophagogastroduodenoscopy N/A 10/10/2014    Procedure: ESOPHAGOGASTRODUODENOSCOPY (EGD);  Surgeon: Manya Silvas, MD;  Location: [facility] ENDOSCOPY;  Service: Endoscopy;  Laterality: N/A;  . Colonoscopy N/A 10/11/2014    Procedure: COLONOSCOPY;  Surgeon: Manya Silvas, MD;  Location: [facility] ENDOSCOPY;  Service: Endoscopy;  Laterality: N/A;    Current Outpatient Rx  Name  Route  Sig  Dispense  Refill  . albuterol (PROVENTIL) (2.5 MG/3ML) 0.083% nebulizer solution   Inhalation   Inhale 2.5 mg into the lungs.         . ALPRAZolam (XANAX) 0.25 MG tablet   Oral   Take 1 tablet (0.25 mg total) by mouth at bedtime as needed for anxiety.   30 tablet   1   . atorvastatin (LIPITOR) 20 MG tablet   Oral   Take 20 mg by mouth at bedtime.          . benzonatate (TESSALON) 100 MG capsule   Oral   Take 100 mg by mouth 3 (three) times daily as needed for cough.         . carvedilol (COREG) 3.125 MG tablet   Oral   Take 3.125 mg by mouth 2 (two) times daily with a meal.         . chlorpheniramine (ALLERGY) 4 MG tablet   Oral   Take 4 mg by  mouth. Reported on 01/02/2015         . Ferrous Sulfate 27 MG TABS   Oral   Take 27 mg by mouth 3 (three) times daily.         Marland Kitchen gabapentin (NEURONTIN) 100 MG capsule      Take 1 capsule by mouth two times daily   180 capsule   1   . guaifenesin (ROBITUSSIN) 100 MG/5ML syrup   Oral   Take 5 mLs (100 mg total) by mouth every 6 (six) hours as needed for cough (HOSPICE PATIENT).   120 mL   3   . isosorbide mononitrate (IMDUR) 30 MG 24 hr tablet   Oral   Take 30 mg by mouth daily.         Marland Kitchen levothyroxine (SYNTHROID, LEVOTHROID) 50 MCG tablet   Oral   Take 1 tablet (50 mcg total) by mouth daily.   90 tablet   0   . megestrol (MEGACE ES) 625 MG/5ML suspension   Oral   Take 5 mLs (625 mg total) by mouth daily.   150 mL   3   . pantoprazole (PROTONIX) 40 MG tablet   Oral   Take 40 mg by mouth daily.         . potassium  chloride SA (K-DUR,KLOR-CON) 20 MEQ tablet      Take 1 tablet by mouth two  times daily Patient taking differently: TAKE 1 TABLET DAILY   180 tablet   0   . torsemide (DEMADEX) 10 MG tablet   Oral   Take 1 tablet (10 mg total) by mouth daily.   90 tablet   0     Allergies Review of patient's allergies indicates no known allergies.  Family History  Problem Relation Age of Onset  . Stomach cancer Mother   . Cancer Mother     unsure where but states "it spead all over"  . Alzheimer's disease Father   . Varicose Veins Daughter   . Diabetes Neg Hx   . Heart disease Neg Hx   . Hypertension Neg Hx   . Stroke Neg Hx   . COPD Neg Hx     Social History Social History  Substance Use Topics  . Smoking status: Former Smoker -- 0.75 packs/day for 30 years    Types: Cigarettes    Quit date: 01/03/2005  . Smokeless tobacco: Never Used  . Alcohol Use: No    Review of Systems Constitutional: No fever/chills Eyes: No visual changes. ENT: No sore throat. Cardiovascular: Denies chest pain. Respiratory: Denies shortness of breath. Gastrointestinal: No abdominal pain.  No nausea, no vomiting.  No diarrhea.  No constipation. Genitourinary: Negative for dysuria. Musculoskeletal: Negative for back pain. Skin: Negative for rash. Neurological: Negative for headaches, focal weakness or numbness.  10-point ROS otherwise negative.  ____________________________________________   PHYSICAL EXAM:  VITAL SIGNS: ED Triage Vitals  Enc Vitals Group     BP 04/21/15 0643 107/59 mmHg     Pulse Rate 04/21/15 0643 105     Resp 04/21/15 0643 11     Temp 04/21/15 0643 98.2 F (36.8 C)     Temp Source 04/21/15 0643 Oral     SpO2 04/21/15 0643 99 %     Weight 04/21/15 0643 147 lb (66.679 kg)     Height 04/21/15 0643 '5\' 2"'$  (1.575 m)     Head Cir --      Peak Flow --      Pain Score 04/21/15  6063 0     Pain Loc --      Pain Edu? --      Excl. in [place]? --     Constitutional: Alert and  oriented. Well appearing and in no acute distress. Eyes: Conjunctivae are pale. PERRL. EOMI. Head: Atraumatic. Nose: No congestion/rhinnorhea. Mouth/Throat: Mucous membranes are moist.   Neck: No stridor.   Cardiovascular: Normal rate, regular rhythm. Grossly normal heart sounds.  Good peripheral circulation. Respiratory: Normal respiratory effort.  No retractions. Lungs CTAB. Gastrointestinal: Soft and nontender. No distention. No CVA tenderness. Rectal exam with grossly brown stool but strongly heme positive. Musculoskeletal: No lower extremity tenderness nor edema.  No joint effusions. Neurologic:  Normal speech and language. No gross focal neurologic deficits are appreciated.  Skin:  Skin is warm, dry and intact. No rash noted. Psychiatric: Mood and affect are normal. Speech and behavior are normal.  ____________________________________________   LABS (all labs ordered are listed, but only abnormal results are displayed)  Labs Reviewed  CBC - Abnormal; Notable for the following:    RBC 2.77 (*)    Hemoglobin 7.9 (*)    HCT 24.3 (*)    RDW 20.8 (*)    All other components within normal limits  BASIC METABOLIC PANEL   ____________________________________________  EKG  ED ECG REPORT I, Doran Stabler, the attending physician, personally viewed and interpreted this ECG.   Date: 04/21/2015  EKG Time: 647  Rate: 101  Rhythm: Sinus tachycardia  Axis: Normal  Intervals: Prolonged QT interval.  ST&T Change: No ST segment elevation or depression. No abnormal T-wave inversion.  ____________________________________________  RADIOLOGY   ____________________________________________   PROCEDURES   ____________________________________________   INITIAL IMPRESSION / ASSESSMENT AND PLAN / ED COURSE  Pertinent labs & imaging results that were available during my care of the patient were reviewed by me and considered in my medical decision making (see chart for  details).  ----------------------------------------- 7:56 AM on 04/21/2015 -----------------------------------------  Patient with symptomatic anemia with heme positive stools. Also on iron. Possible GI bleed with symptomatic anemia. Discussed case with Dr. Bridgett Larsson will be admitting the patient but would like to hold off on blood transfusion. Patient aware of the plan. Understanding of admission willing to comply. Also still pending renal function. ____________________________________________   FINAL CLINICAL IMPRESSION(S) / ED DIAGNOSES  Symptomatic anemia.    Orbie Pyo, MD 04/21/15 [phone]  Patient's blood pressure dropped to 85 systolic. This is after about 500 cc of crystalloid. We'll transfuse one unit of PRBC. Dr. Bridgett Larsson aware.  Orbie Pyo, MD 04/21/15 [phone]

## 2015-04-21 NOTE — Progress Notes (Signed)
Patient blood pressure remains low. Order to hold Coreg per Dr. Fritzi Mandes. Madlyn Frankel, RN

## 2015-04-21 NOTE — H&P (Signed)
[facility] [place] NAME: Katrina Kramer    MR#:  [id number]  DATE OF BIRTH:  26-Dec-1940  DATE OF ADMISSION:  04/21/2015  PRIMARY CARE PHYSICIAN: Enid Derry, MD   REQUESTING/REFERRING PHYSICIAN: dr Clearnce Hasten  CHIEF COMPLAINT:  Weakness, dizziness, fall this morning.  HISTORY OF PRESENT ILLNESS:  Katrina Kramer  is a 75 y.o. female with a known history of Iron deficiency anemia/anemia of chronic disease, history of lung cancer, hypertension, chronic atrial fibrillation not on any anticoagulation comes to the emergency room after she had felt dizzy and weak and had fall at home. She denies any injury. Patient was found to be heme positive. Her hematocrit was down to 7.9. Last hemoglobin was 9.9 on 04/02/2015. She takes iron pills twice a day on a regular basis denies any blood clot any nausea vomiting or hematemesis. Patient is being admitted for further evaluation and management. ER M.D. had ordered 1 unit of blood transfusion.  PAST MEDICAL HISTORY:   Past Medical History  Diagnosis Date  . Atrial fibrillation ([place])     Dr. Humphrey Rolls, cardiologist  . Anemia   . Hypothyroidism   . Lung cancer ([place])   . Hypertension   . Lung cancer ([place])   . Anemia     PAST SURGICAL HISTOIRY:   Past Surgical History  Procedure Laterality Date  . Upper gastrointestinal endoscopy  02/24/14  . Esophagogastroduodenoscopy N/A 10/10/2014    Procedure: ESOPHAGOGASTRODUODENOSCOPY (EGD);  Surgeon: Manya Silvas, MD;  Location: [facility] ENDOSCOPY;  Service: Endoscopy;  Laterality: N/A;  . Colonoscopy N/A 10/11/2014    Procedure: COLONOSCOPY;  Surgeon: Manya Silvas, MD;  Location: [facility] ENDOSCOPY;  Service: Endoscopy;  Laterality: N/A;    SOCIAL HISTORY:   Social History  Substance Use Topics  . Smoking status: Former Smoker -- 0.75 packs/day for 30 years    Types: Cigarettes    Quit date: 01/03/2005  . Smokeless tobacco: Never Used  . Alcohol Use: No     FAMILY HISTORY:   Family History  Problem Relation Age of Onset  . Stomach cancer Mother   . Cancer Mother     unsure where but states "it spead all over"  . Alzheimer's disease Father   . Varicose Veins Daughter   . Diabetes Neg Hx   . Heart disease Neg Hx   . Hypertension Neg Hx   . Stroke Neg Hx   . COPD Neg Hx     DRUG ALLERGIES:  No Known Allergies  REVIEW OF SYSTEMS:  Review of Systems  Constitutional: Negative for fever, chills and weight loss.  HENT: Negative for ear discharge, ear pain and nosebleeds.   Eyes: Negative for blurred vision, pain and discharge.  Respiratory: Negative for sputum production, shortness of breath, wheezing and stridor.   Cardiovascular: Negative for chest pain, palpitations, orthopnea and PND.  Gastrointestinal: Negative for nausea, vomiting, abdominal pain and diarrhea.  Genitourinary: Negative for urgency and frequency.  Musculoskeletal: Positive for falls. Negative for back pain and joint pain.  Neurological: Positive for weakness. Negative for sensory change, speech change and focal weakness.  Psychiatric/Behavioral: Negative for depression and hallucinations. The patient is not nervous/anxious.   All other systems reviewed and are negative.    MEDICATIONS AT HOME:   Prior to Admission medications   Medication Sig Start Date End Date Taking? Authorizing Provider  albuterol (PROVENTIL) (2.5 MG/3ML) 0.083% nebulizer solution Inhale 2.5 mg into the lungs every 4 (four) hours as  needed.    Yes Historical Provider, MD  ALPRAZolam (XANAX) 0.25 MG tablet Take 1 tablet (0.25 mg total) by mouth at bedtime as needed for anxiety. 02/27/15  Yes Cammie Sickle, MD  amiodarone (PACERONE) 200 MG tablet Take 200 mg by mouth daily.   Yes Historical Provider, MD  atorvastatin (LIPITOR) 20 MG tablet Take 20 mg by mouth at bedtime.  09/08/14  Yes Historical Provider, MD  benzonatate (TESSALON) 100 MG capsule Take 100 mg by mouth 3 (three) times  daily as needed for cough.   Yes Lloyd Huger, MD  carvedilol (COREG) 3.125 MG tablet Take 6.25 mg by mouth 2 (two) times daily with a meal.    Yes Historical Provider, MD  chlorpheniramine (ALLERGY) 4 MG tablet Take 4 mg by mouth every 4 (four) hours as needed. Reported on 01/02/2015   Yes Historical Provider, MD  Ferrous Sulfate 27 MG TABS Take 27 mg by mouth 3 (three) times daily.   Yes Historical Provider, MD  gabapentin (NEURONTIN) 100 MG capsule Take 1 capsule by mouth two times daily 01/18/15  Yes Arnetha Courser, MD  isosorbide mononitrate (IMDUR) 30 MG 24 hr tablet Take 30 mg by mouth daily.   Yes Historical Provider, MD  levothyroxine (SYNTHROID, LEVOTHROID) 50 MCG tablet Take 1 tablet (50 mcg total) by mouth daily. 02/19/15  Yes Arnetha Courser, MD  megestrol (MEGACE ES) 625 MG/5ML suspension Take 5 mLs (625 mg total) by mouth daily. 03/20/15  Yes Cammie Sickle, MD  pantoprazole (PROTONIX) 40 MG tablet Take 40 mg by mouth daily.   Yes Historical Provider, MD  potassium chloride SA (K-DUR,KLOR-CON) 20 MEQ tablet Take 20 mEq by mouth.   Yes Historical Provider, MD  torsemide (DEMADEX) 10 MG tablet Take 1 tablet (10 mg total) by mouth daily. 03/04/15  Yes Arnetha Courser, MD      VITAL SIGNS:  Blood pressure 108/48, pulse 95, temperature 98.7 F (37.1 C), temperature source Oral, resp. rate 18, height '5\' 2"'$  (1.575 m), weight 66.679 kg (147 lb), SpO2 98 %.  PHYSICAL EXAMINATION:  GENERAL:  74 y.o.-year-old patient lying in the bed with no acute distress. Generalized pallor EYES: Pupils equal, round, reactive to light and accommodation. No scleral icterus. Extraocular muscles intact.  HEENT: Head atraumatic, normocephalic. Oropharynx and nasopharynx clear.  NECK:  Supple, no jugular venous distention. No thyroid enlargement, no tenderness.  LUNGS: Normal breath sounds bilaterally, no wheezing, rales,rhonchi or crepitation. No use of accessory muscles of respiration.   CARDIOVASCULAR: S1, S2 normal. No murmurs, rubs, or gallops.  ABDOMEN: Soft, nontender, nondistended. Bowel sounds present. No organomegaly or mass.  EXTREMITIES: No pedal edema, cyanosis, or clubbing.  NEUROLOGIC: Cranial nerves II through XII are intact. Muscle strength 5/5 in all extremities. Sensation intact. Gait not checked.  PSYCHIATRIC: The patient is alert and oriented x 3.  SKIN: No obvious rash, lesion, or ulcer.   LABORATORY PANEL:   CBC  Recent Labs Lab 04/21/15 0645  WBC 5.7  HGB 7.9*  HCT 24.3*  PLT 376   ------------------------------------------------------------------------------------------------------------------  Chemistries   Recent Labs Lab 04/21/15 0645  NA 136  K 3.8  CL 106  CO2 22  GLUCOSE 144*  BUN 48*  CREATININE 1.78*  CALCIUM 8.4*   IMPRESSION AND PLAN:   Katrina Kramer  is a 75 y.o. female with a known history of Iron deficiency anemia/anemia of chronic disease, history of lung cancer, hypertension, chronic atrial fibrillation not on any anticoagulation comes to  the emergency room after she had felt dizzy and weak and had fall at home. She denies any injury. Patient was found to be heme positive. Her hematocrit was down to 7.9.  1. Acute on chronic slow GI bleed -Patient has history of chronic anemia on ferrous sulfate 325 twice a day -Came in with hemoglobin of 7.9. Last hemoglobin was 9.9 March 30 -Has history of colonic angiectasis and had undergone colonoscopy in October 2016 with argon laser therapy. Patient also had EGD done same time which was negative -Currently getting 1 unit of blood transfusion -hgb 7.9---1 unit blood transfusion -Continue PPI -Case discussed with Dr. Rayann Heman from GI to see her -Avoid any NSAIDs or antiplatelets  2. Iron deficiency anemia with low serum iron -On by mouth iron pills -Patient may benefit from IV event for her as outpatient she follows up at the cancer center  3. History of chronic atrial  fibrillation on amiodarone and Coreg -I will hold off Coreg given relative hypotension  4. Hypothyroidism continue Synthroid  5. Hyperlipidemia on Lipitor  All the records are reviewed and case discussed with ED provider. Management plans discussed with the patient, family and they are in agreement.  CODE STATUS: full  TOTAL TIME TAKING CARE OF THIS PATIENT:50 minutes.    Elad Macphail M.D on 04/21/2015 at 3:46 PM  Between 7am to 6pm - Pager - [phone]  After 6pm go to [email or web address] - password [email or web address] [facility]  Office  [phone]  CC: Primary care physician; Enid Derry, MD

## 2015-04-21 NOTE — ED Notes (Signed)
Pt observed lying in bed  VSS  NAD assessed  Continue to monitor

## 2015-04-21 NOTE — ED Notes (Signed)
BP low at this time  MD Schaevitz aware - awaiting hospitalist to consult with her   pt placed  Trendelenburg    Recheck 85/46  Continue to monitor

## 2015-04-21 NOTE — Progress Notes (Signed)
Visit made. Patient is currently followed by [facility] at home with a hospice diagnosis of lung cancer. She is a Full Code. CMRN Hassan Rowan made aware. She was brought to [facility] via EMS for evaluation of weakness/falls. Patient seen lying in bed alert and interactive. She has received one unit of blood for an admission hemoglobin of 7.9 and reports feeling"some better". Husband Vernard Gambles a bedside, and made aware that Probation officer will follow and update hospice team through discharge. Updated information faxed to triage. Flo Shanks RN, BSN, [facility] [place], [facility] [phone] c

## 2015-04-21 NOTE — Consult Note (Signed)
GI Inpatient Consult Note  Reason for Consult: Acute on chronic anemia   Attending Requesting Consult: Dr. Posey Pronto  History of Present Illness: Katrina Kramer is a 75 y.o. female with a history of Afib (not on anticoagulation, followed by Dr. Humphrey Rolls), h/o recurrent metastatic lung cancer (followed by Dr. Rogue Bussing), CKD, chronic IDA on ferrous sulfate '325mg'$  TID (followed by [facility]/Dr. Vira Agar), and hypothyroidism admitted with symptomatic anemia.  Patient presented to the [facility] ED with complaints of weakness and dizziness since last night.  She was in her usual state when these symptoms began.  Early this morning she tried to get up and use the restroom, but fell x3 during the morning. Patient states her legs felt very week, as well as worsening dizziness and fatigue.  She continues to take iron TID.  No epigastric pain, nausea, vomiting, hematochezia, or melena.  Also no change in bowel habits.  Her weight and appetite are stable.  Patient is not taking NSAIDs or blood thinning medications.      VSS. Labs: Hgb 7.9, iron 22, TIBC 247; BUN 48, Cr 1.78 Plan: Transfuse 1 unit, recheck Hgb  Katrina Kramer reports her weakness and dizziness seem better since receiving 1 unit.  She denies new complaints at this time.  Patient also mentions she uses O2 at night, and wants to be sure she has this tonight.  Colonoscopy: 10/11/14 Vira Agar) - multiple non-bleeding colonic angioectasias, treated with APC and clips placed EGD: 10/11/14 Vira Agar) - normal esophagus and stomach, mild duodenitis  Past Medical History:  Past Medical History  Diagnosis Date  . Atrial fibrillation ([place])     Dr. Humphrey Rolls, cardiologist  . Anemia   . Hypothyroidism   . Lung cancer ([place])   . Hypertension   . Lung cancer ([place])   . Anemia     Problem List: Patient Active Problem List   Diagnosis Date Noted  . Carbuncle, groin 03/04/2015  . Hypokalemia 01/22/2015  . CKD (chronic kidney disease) stage 3, GFR 30-59 ml/min 01/05/2015  .  Abnormal weight loss 01/05/2015  . Acid reflux 08/31/2014  . Atrial fibrillation ([place])   . Anemia   . Hypothyroidism   . Lung cancer ([place])   . Hypertension   . IDA (iron deficiency anemia) 07/04/2014  . H/O upper gastrointestinal hemorrhage 03/26/2014  . CAFL (chronic airflow limitation) ([place]) 09/24/2013    Past Surgical History: Past Surgical History  Procedure Laterality Date  . Upper gastrointestinal endoscopy  02/24/14  . Esophagogastroduodenoscopy N/A 10/10/2014    Procedure: ESOPHAGOGASTRODUODENOSCOPY (EGD);  Surgeon: Manya Silvas, MD;  Location: [facility] ENDOSCOPY;  Service: Endoscopy;  Laterality: N/A;  . Colonoscopy N/A 10/11/2014    Procedure: COLONOSCOPY;  Surgeon: Manya Silvas, MD;  Location: [facility] ENDOSCOPY;  Service: Endoscopy;  Laterality: N/A;    Allergies: No Known Allergies  Home Medications: Prescriptions prior to admission  Medication Sig Dispense Refill Last Dose  . albuterol (PROVENTIL) (2.5 MG/3ML) 0.083% nebulizer solution Inhale 2.5 mg into the lungs every 4 (four) hours as needed.    prn at prn  . ALPRAZolam (XANAX) 0.25 MG tablet Take 1 tablet (0.25 mg total) by mouth at bedtime as needed for anxiety. 30 tablet 1 prn at prn  . amiodarone (PACERONE) 200 MG tablet Take 200 mg by mouth daily.   04/20/2015 at Unknown time  . atorvastatin (LIPITOR) 20 MG tablet Take 20 mg by mouth at bedtime.    04/20/2015 at Unknown time  . benzonatate (TESSALON) 100 MG capsule Take  100 mg by mouth 3 (three) times daily as needed for cough.   prn at prn  . carvedilol (COREG) 3.125 MG tablet Take 6.25 mg by mouth 2 (two) times daily with a meal.    04/20/2015 at Unknown time  . chlorpheniramine (ALLERGY) 4 MG tablet Take 4 mg by mouth every 4 (four) hours as needed. Reported on 01/02/2015   prn at prn  . Ferrous Sulfate 27 MG TABS Take 27 mg by mouth 3 (three) times daily.   04/20/2015 at Unknown time  . gabapentin (NEURONTIN) 100 MG capsule Take 1 capsule by mouth two times daily  180 capsule 1 04/20/2015 at Unknown time  . isosorbide mononitrate (IMDUR) 30 MG 24 hr tablet Take 30 mg by mouth daily.   04/20/2015 at Unknown time  . levothyroxine (SYNTHROID, LEVOTHROID) 50 MCG tablet Take 1 tablet (50 mcg total) by mouth daily. 90 tablet 0 04/20/2015 at Unknown time  . megestrol (MEGACE ES) 625 MG/5ML suspension Take 5 mLs (625 mg total) by mouth daily. 150 mL 3 prn at prn  . pantoprazole (PROTONIX) 40 MG tablet Take 40 mg by mouth daily.   04/20/2015 at Unknown time  . potassium chloride SA (K-DUR,KLOR-CON) 20 MEQ tablet Take 20 mEq by mouth.   04/20/2015 at Unknown time  . torsemide (DEMADEX) 10 MG tablet Take 1 tablet (10 mg total) by mouth daily. 90 tablet 0 04/20/2015 at Unknown time   Home medication reconciliation was completed with the patient.   Scheduled Inpatient Medications:   . amiodarone  200 mg Oral Daily  . atorvastatin  20 mg Oral QHS  . carvedilol  6.25 mg Oral BID WC  . ferrous sulfate  325 mg Oral TID  . gabapentin  100 mg Oral BID  . isosorbide mononitrate  30 mg Oral Daily  . levothyroxine  50 mcg Oral QAC breakfast  . megestrol  400 mg Oral Daily  . pantoprazole  40 mg Oral Daily  . potassium chloride SA  20 mEq Oral Daily    Continuous Inpatient Infusions:     PRN Inpatient Medications:  acetaminophen **OR** acetaminophen, albuterol, ALPRAZolam, benzonatate, bisacodyl, ondansetron **OR** ondansetron (ZOFRAN) IV, senna-docusate  Family History: family history includes Alzheimer's disease in her father; Cancer in her mother; Stomach cancer in her mother; Varicose Veins in her daughter. There is no history of Diabetes, Heart disease, Hypertension, Stroke, or COPD.    Social History:   reports that she quit smoking about 10 years ago. Her smoking use included Cigarettes. She has a 22.5 pack-year smoking history. She has never used smokeless tobacco. She reports that she does not drink alcohol or use illicit drugs.  Review of  Systems: Constitutional: Weight is stable.  Eyes: No changes in vision. ENT: No oral lesions, sore throat.  GI: see HPI.  Heme/Lymph: No easy bruising.  CV: No chest pain.  GU: No hematuria.  Integumentary: No rashes.  Neuro: No headaches.  Psych: No depression/anxiety.  Endocrine: No heat/cold intolerance.  Allergic/Immunologic: No urticaria.  Resp: No cough, SOB.  Musculoskeletal: No joint swelling.    Physical Examination: BP 108/48 mmHg  Pulse 95  Temp(Src) 98.7 F (37.1 C) (Oral)  Resp 18  Ht '5\' 2"'$  (1.575 m)  Wt 66.679 kg (147 lb)  BMI 26.88 kg/m2  SpO2 98% Gen: NAD, alert and oriented x 4 HEENT: PEERLA, EOMI, Neck: supple, no JVD or thyromegaly Chest: CTA bilaterally, no wheezes, crackles, or other adventitious sounds CV: RRR, no m/g/c/r Abd: soft, NT,  ND, +BS in all four quadrants; no HSM, guarding, ridigity, or rebound tenderness Ext: no edema, well perfused with 2+ pulses, Skin: no rash or lesions noted Lymph: no LAD  Data: Lab Results  Component Value Date   WBC 5.7 04/21/2015   HGB 7.9* 04/21/2015   HCT 24.3* 04/21/2015   MCV 87.6 04/21/2015   PLT 376 04/21/2015    Recent Labs Lab 04/21/15 0645  HGB 7.9*   Lab Results  Component Value Date   NA 136 04/21/2015   K 3.8 04/21/2015   CL 106 04/21/2015   CO2 22 04/21/2015   BUN 48* 04/21/2015   CREATININE 1.78* 04/21/2015   Lab Results  Component Value Date   ALT 25 04/02/2015   AST 19 04/02/2015   ALKPHOS 78 04/02/2015   BILITOT 0.8 04/02/2015   No results for input(s): APTT, INR, PTT in the last 168 hours.   Assessment/Plan: Katrina Kramer is a 75 y.o. female with a history of Afib (not on anticoagulation, followed by Dr. Humphrey Rolls), h/o recurrent metastatic lung cancer (followed by Dr. Rogue Bussing), CKD, chronic IDA on ferrous sulfate '325mg'$  TID (followed by [facility]/Dr. Vira Agar), and hypothyroidism admitted with symptomatic anemia.  Hgb is mildly decreased at 7.9, baseline is approximately 8-9.   Patient denies overt bleeding or current GI symptoms.  Recent colonoscopy and EGD in 10/2014 demonstrated a few angiectasias.  Recent PET scan reassuring for GI mets.  Chronic IDA is likely due to multiple chronic comorbidities, including CKD and lung cancer s/p XRT and chemo. Procedures are not recommended, as patient is a poor candidate and scopes are likely low-yield.  Hgb is not that far below baseline and will likely stabilize with 1 unit.  If patient's Hgb improves and she is stable for discharge, recommend outpatient follow-up to monitor Hgb.  Will consider capsule endoscopy at that time if Hgb continues to drop.    Recommendations: - Monitor Hgb, transfuse <7 - Plan for outpatient follow-up to monitor Hgb - if it drops again, would consider capsule endoscopy  Thank you for the consult. We will follow along with you. Please call with questions or concerns.  Lavera Guise, PA-C [facility] Phone: [phone] Pager: [phone]

## 2015-04-22 LAB — TYPE AND SCREEN
ABO/RH(D): AB POS
Antibody Screen: NEGATIVE
Unit division: 0

## 2015-04-22 LAB — HEMOGLOBIN: HEMOGLOBIN: 9.2 g/dL — AB (ref 12.0–16.0)

## 2015-04-22 MED ORDER — SODIUM CHLORIDE 0.9% FLUSH
3.0000 mL | Freq: Two times a day (BID) | INTRAVENOUS | Status: DC
  Administered 2015-04-22: 3 mL via INTRAVENOUS

## 2015-04-22 NOTE — Progress Notes (Signed)
Up to chair for breakfast. Madlyn Frankel, RN

## 2015-04-22 NOTE — Discharge Summary (Signed)
Katrina Kramer NAME: Katrina Kramer    MR#:  [id number]  DATE OF BIRTH:  April 07, 1940  DATE OF ADMISSION:  04/21/2015 ADMITTING PHYSICIAN: Fritzi Mandes, MD  DATE OF DISCHARGE: 04/22/15  PRIMARY CARE PHYSICIAN: Enid Derry, MD    ADMISSION DIAGNOSIS:  Fall, initial encounter [W19.XXXA] Symptomatic anemia [D64.9]  DISCHARGE DIAGNOSIS:  Chronic Iron deficiency anemia s/p 1 unit BT Afib,chronic  SECONDARY DIAGNOSIS:   Past Medical History  Diagnosis Date  . Atrial fibrillation ([place])     Dr. Humphrey Rolls, cardiologist  . Anemia   . Hypothyroidism   . Lung cancer ([place])   . Hypertension   . Lung cancer ([place])   . Anemia     HOSPITAL COURSE:   Katrina Kramer is a 75 y.o. female with a known history of Iron deficiency anemia/anemia of chronic disease, history of lung cancer, hypertension, chronic atrial fibrillation not on any anticoagulation comes to the emergency room after she had felt dizzy and weak and had fall at home. She denies any injury. Patient was found to be heme positive. Her hematocrit was down to 7.9.  1. Acute on chronic slow GI bleed -Patient has history of chronic anemia on ferrous sulfate 325 twice a day -Came in with hemoglobin of 7.9. Last hemoglobin was 9.9 March 30 -Has history of colonic angiectasis and had undergone colonoscopy in October 2016 with argon laser therapy. Patient also had EGD done same time which was negative -Currently getting 1 unit of blood transfusion -hgb 7.9---1 unit blood transfusion---8.6---9.2 -Continue PPI -Case discussed with Dr. Rayann Heman and recommends f/u with GI as out pt. No ative bleeding and no indication for any w/u at present -Avoid any NSAIDs or antiplatelets  2. Iron deficiency anemia with low serum iron -On by mouth iron pills -Patient may benefit from IV event for her as outpatient she follows up at the cancer center  3. History of chronic atrial fibrillation on amiodarone  and Coreg  4. Hypothyroidism continue Synthroid  5. Hyperlipidemia on Lipitor Pt is followed by Hospic at home D/c home later today Pt agreeable CONSULTS OBTAINED:  Treatment Team:  Josefine Class, MD Lollie Sails, MD  DRUG ALLERGIES:  No Known Allergies  DISCHARGE MEDICATIONS:   Current Discharge Medication List    CONTINUE these medications which have NOT CHANGED   Details  albuterol (PROVENTIL) (2.5 MG/3ML) 0.083% nebulizer solution Inhale 2.5 mg into the lungs every 4 (four) hours as needed.     ALPRAZolam (XANAX) 0.25 MG tablet Take 1 tablet (0.25 mg total) by mouth at bedtime as needed for anxiety. 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    atorvastatin (LIPITOR) 20 MG tablet Take 20 mg by mouth at bedtime.     benzonatate (TESSALON) 100 MG capsule Take 100 mg by mouth 3 (three) times daily as needed for cough.    carvedilol (COREG) 3.125 MG tablet Take 6.25 mg by mouth 2 (two) times daily with a meal.     chlorpheniramine (ALLERGY) 4 MG tablet Take 4 mg by mouth every 4 (four) hours as needed. Reported on 01/02/2015    Ferrous Sulfate 27 MG TABS Take 27 mg by mouth 3 (three) times daily.    gabapentin (NEURONTIN) 100 MG capsule Take 1 capsule by mouth two times daily Qty: 180  capsule, Refills: 1    isosorbide mononitrate (IMDUR) 30 MG 24 hr tablet Take 30 mg by mouth daily.    levothyroxine (SYNTHROID, LEVOTHROID) 50 MCG tablet Take 1 tablet (50 mcg total) by mouth daily. Qty: 90 tablet, Refills: 0    megestrol (MEGACE ES) 625 MG/5ML suspension Take 5 mLs (625 mg total) by mouth daily. 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    potassium chloride SA  (K-DUR,KLOR-CON) 20 MEQ tablet Take 20 mEq by mouth.    torsemide (DEMADEX) 10 MG tablet Take 1 tablet (10 mg total) by mouth daily. Qty: 90 tablet, Refills: 0        If you experience worsening of your admission symptoms, develop shortness of breath, life threatening emergency, suicidal or homicidal thoughts you must seek medical attention immediately by calling 911 or calling your MD immediately  if symptoms less severe.  You Must read complete instructions/literature along with all the possible adverse reactions/side effects for all the Medicines you take and that have been prescribed to you. Take any new Medicines after you have completely understood and accept all the possible adverse reactions/side effects.   Please note  You were cared for by a hospitalist during your hospital stay. If you have any questions about your discharge medications or the care you received while you were in the hospital after you are discharged, you can call the unit and asked to speak with the hospitalist on call if the hospitalist that took care of you is not available. Once you are discharged, your primary care physician will handle any further medical issues. Please note that NO REFILLS for any discharge medications will be authorized once you are discharged, as it is imperative that you return to your primary care physician (or establish a relationship with a primary care physician if you do not have one) for your aftercare needs so that they can reassess your need for medications and monitor your lab values. Today   SUBJECTIVE  weak   VITAL SIGNS:  Blood pressure 99/47, pulse 99, temperature 98 F (36.7 C), temperature source Oral, resp. rate 24, height '5\' 2"'$  (1.575 m), weight 66.679 kg (147 lb), SpO2 100 %.  I/O:   Intake/Output Summary (Last 24 hours) at 04/22/15 1319 Last data filed at 04/22/15 0900  Gross per 24 hour  Intake    790 ml  Output      0 ml  Net    790 ml    PHYSICAL  EXAMINATION:  GENERAL:  75 y.o.-year-old patient lying in the bed with no acute distress.  EYES: Pupils equal, round, reactive to light and accommodation. No scleral icterus. Extraocular muscles intact.  HEENT: Head atraumatic, normocephalic. Oropharynx and nasopharynx clear.  NECK:  Supple, no jugular venous distention. No thyroid enlargement, no tenderness.  LUNGS: Normal breath sounds bilaterally, no wheezing, rales,rhonchi or crepitation. No use of accessory muscles of respiration.  CARDIOVASCULAR: S1, S2 normal. No murmurs, rubs, or gallops.  ABDOMEN: Soft, non-tender, non-distended. Bowel sounds present. No organomegaly or mass.  EXTREMITIES: No pedal edema, cyanosis, or clubbing.  NEUROLOGIC: Cranial nerves II through XII are intact. Muscle strength 5/5 in all extremities. Sensation intact. Gait not checked.  PSYCHIATRIC: The patient is alert and oriented x 3.  SKIN: No obvious rash, lesion, or ulcer.   DATA REVIEW:   CBC  Recent Labs Lab 04/21/15 0645  04/22/15 0816  WBC 5.7  --   --   HGB 7.9*  < > 9.2*  HCT 24.3*  --   --   PLT 376  --   --   < > = values in this interval not displayed.  Chemistries   Recent Labs Lab 04/21/15 0645  NA 136  K 3.8  CL 106  CO2 22  GLUCOSE 144*  BUN 48*  CREATININE 1.78*  CALCIUM 8.4*    Microbiology Results   No results found for this or any previous visit (from the past 240 hour(s)).  RADIOLOGY:  No results found.   Management plans discussed with the patient, family and they are in agreement.  CODE STATUS:     Code Status Orders        Start     Ordered   04/21/15 1112  Full code   Continuous     04/21/15 1111    Code Status History    Date Active Date Inactive Code Status Order ID Comments User Context   10/08/2014  6:11 PM 10/12/2014  7:15 PM Full Code [id number]  Leia Alf, MD Inpatient      TOTAL TIME TAKING CARE OF THIS PATIENT: 40 minutes.    Allard Lightsey M.D on 04/22/2015 at 1:19 PM  Between  7am to 6pm - Pager - [phone] After 6pm go to [email or web address] - password [email or web address] [facility]  Office  [phone]  CC: Primary care physician; Enid Derry, MD

## 2015-04-22 NOTE — Discharge Instructions (Signed)
Hospice to follow at home-resume

## 2015-04-22 NOTE — Progress Notes (Signed)
Visit made. Patient seen lying in bed, just back in bed from ambulating to the bathroom. She reported being short of breath and requested her oxygen to be placed on. Patient appeared to be uncomfortable, but denied pain. Reported to staff RN Ok Edwards.Will continue to follow through discharge and update hospice team. Flo Shanks RN, BSN, [facility] [place], hospital Liaison [phone] c

## 2015-04-22 NOTE — Progress Notes (Signed)
Pt and husband given d/c instructions r/t activity, followup care and medications, voiced understanding.

## 2015-04-22 NOTE — Progress Notes (Signed)
Pt d/c home via wheelchair escorted by family and staff

## 2015-04-22 NOTE — Care Management (Addendum)
Admitted to this facility with the diagnosis of anemia. Lives with husband Vernard Gambles [phone]). Last seen  Dr Sanda Klein  at [facility]  About 2 months ago. [facility] in the past. Followed by [facility] in the home about 2 months. Takes care of all basic activities of daily living herself, can drive, if needed. No Life Alert. No home oxygen. Fell Tuesday morning. Good appetite. Husband will transport. Shelbie Ammons RN MSN CCM Care Management [phone]

## 2015-04-22 NOTE — Care Management (Signed)
Patient discharging to home today with resumption of [facility]. Santiago Glad with [facility] hospice is aware that patient is here and has met with patient today. She states she will be able to ride in a car and that her husband will provide transportation. She states she is on home O2 also through Hospice. No further RNCM needs.

## 2015-04-22 NOTE — Evaluation (Signed)
Physical Therapy Evaluation Patient Details Name: Katrina Kramer MRN: [id number] DOB: 1940-04-25 Today's Date: 04/22/2015   History of Present Illness  Pt is a 75 y/o female who presents after falling at home. Noted to have initial Hgb of 7.9, now 8.6 after a transfusion. She uses O2 at night and has been demonstrating low BPs since admission. PMH significant for lung cancer and is being followed at home by Hospice services.  Clinical Impression  Patient is currently being followed at home by Hospice services for Lung Cancer. She is limited primarily by feeling of shortness of breath in this session, though O2 sats on room air measured between 95-98%. She is able to take short bouts of ambulation prior to fatiguing requiring something to hold on to. Patient provided with RW for additional support which allowed her to increase her ambulation per bout. Per son present, this appears to be her baseline level of mobility recently, she uses a w/c for any prolonged mobility. Given her cardiopulmonary status, would recommend HHPT to increase her tolerance for mobility.     Follow Up Recommendations Home health PT    Equipment Recommendations       Recommendations for Other Services       Precautions / Restrictions Precautions Precautions: Fall Restrictions Weight Bearing Restrictions: No      Mobility  Bed Mobility               General bed mobility comments: Patient received in recliner.   Transfers Overall transfer level: Needs assistance   Transfers: Sit to/from Stand Sit to Stand: Min guard         General transfer comment: Patient demonstrates no loss of balance, no complaints of dizziness with sit to stand transfer.   Ambulation/Gait Ambulation/Gait assistance: Min guard Ambulation Distance (Feet): 125 Feet Assistive device: Rolling walker (2 wheeled) Gait Pattern/deviations: Step-through pattern;Decreased step length - right;Decreased step length - left;Trunk  flexed   Gait velocity interpretation: <1.8 ft/sec, indicative of risk for recurrent falls General Gait Details: Patient initially ambulates in room without assistance, however as she fatigues she begins to hold on to railing in hallway for balance/rest breaks. She was provided with a RW midway through ambulation, which allowed her to ambulate for more prolonged bouts.   Stairs            Wheelchair Mobility    Modified Rankin (Stroke Patients Only)       Balance Overall balance assessment: Needs assistance   Sitting balance-Leahy Scale: Good     Standing balance support: No upper extremity supported Standing balance-Leahy Scale: Fair Standing balance comment: Patient begins grabbing on to hand rails in hallway, it appears more for support with breathing than for balance assistance, though she appeared to require some balance assistance as she began to fatigue. Provided with HEP to mitigate.                              Pertinent Vitals/Pain Pain Assessment:  (Complains of shortness of breath at the end of session, no indications of pain)    Home Living Family/patient expects to be discharged to:: Private residence Living Arrangements: Spouse/significant other Available Help at Discharge: Family;Available 24 hours/day Type of Home: House Home Access:  (PT questioned, no response provided)       Home Equipment: Walker - 2 wheels;Wheelchair - manual      Prior Function Level of Independence: Independent;Independent with assistive device(s)  Comments: Patient reports she has occasionally used a RW and/or wheelchair for longer distance mobility. She denies use of AD otherwise. Reports she fell several times over the weekend landing her in hospital, denies other falls.      Hand Dominance        Extremity/Trunk Assessment   Upper Extremity Assessment: Overall WFL for tasks assessed           Lower Extremity Assessment: Overall WFL for  tasks assessed         Communication   Communication: No difficulties  Cognition Arousal/Alertness: Awake/alert Behavior During Therapy: WFL for tasks assessed/performed Overall Cognitive Status: Within Functional Limits for tasks assessed                      General Comments      Exercises        Assessment/Plan    PT Assessment Patient needs continued PT services  PT Diagnosis Difficulty walking;Generalized weakness   PT Problem List Decreased strength;Decreased mobility;Decreased activity tolerance;Cardiopulmonary status limiting activity;Decreased balance;Decreased knowledge of use of DME  PT Treatment Interventions Stair training;Gait training;DME instruction;Therapeutic exercise;Therapeutic activities;Balance training   PT Goals (Current goals can be found in the Care Plan section) Acute Rehab PT Goals Patient Stated Goal: To return home when possible  PT Goal Formulation: With patient/family Time For Goal Achievement: 05/06/15 Potential to Achieve Goals: Good    Frequency Min 2X/week   Barriers to discharge        Co-evaluation               End of Session Equipment Utilized During Treatment: Gait belt Activity Tolerance: Patient limited by fatigue Patient left: in chair;with call bell/phone within reach;with family/visitor present (Alarm not on when PT entered room) Nurse Communication: Mobility status         Time: 3794-3276 PT Time Calculation (min) (ACUTE ONLY): 20 min   Charges:   PT Evaluation $PT Eval Moderate Complexity: 1 Procedure      PT G Codes:       Kerman Passey, PT, DPT    04/22/2015, 1:32 PM

## 2015-04-24 DIAGNOSIS — D649 Anemia, unspecified: Secondary | ICD-10-CM | POA: Diagnosis not present

## 2015-04-24 DIAGNOSIS — C3411 Malignant neoplasm of upper lobe, right bronchus or lung: Secondary | ICD-10-CM | POA: Diagnosis not present

## 2015-04-24 DIAGNOSIS — N189 Chronic kidney disease, unspecified: Secondary | ICD-10-CM | POA: Diagnosis not present

## 2015-04-24 DIAGNOSIS — I4891 Unspecified atrial fibrillation: Secondary | ICD-10-CM | POA: Diagnosis not present

## 2015-04-24 DIAGNOSIS — I129 Hypertensive chronic kidney disease with stage 1 through stage 4 chronic kidney disease, or unspecified chronic kidney disease: Secondary | ICD-10-CM | POA: Diagnosis not present

## 2015-04-24 DIAGNOSIS — E039 Hypothyroidism, unspecified: Secondary | ICD-10-CM | POA: Diagnosis not present

## 2015-04-27 DIAGNOSIS — N189 Chronic kidney disease, unspecified: Secondary | ICD-10-CM | POA: Diagnosis not present

## 2015-04-27 DIAGNOSIS — D649 Anemia, unspecified: Secondary | ICD-10-CM | POA: Diagnosis not present

## 2015-04-27 DIAGNOSIS — I129 Hypertensive chronic kidney disease with stage 1 through stage 4 chronic kidney disease, or unspecified chronic kidney disease: Secondary | ICD-10-CM | POA: Diagnosis not present

## 2015-04-27 DIAGNOSIS — I4891 Unspecified atrial fibrillation: Secondary | ICD-10-CM | POA: Diagnosis not present

## 2015-04-27 DIAGNOSIS — C3411 Malignant neoplasm of upper lobe, right bronchus or lung: Secondary | ICD-10-CM | POA: Diagnosis not present

## 2015-04-27 DIAGNOSIS — E039 Hypothyroidism, unspecified: Secondary | ICD-10-CM | POA: Diagnosis not present

## 2015-04-28 ENCOUNTER — Ambulatory Visit: Payer: Medicare Other | Admitting: Family Medicine

## 2015-04-30 ENCOUNTER — Other Ambulatory Visit: Payer: Self-pay | Admitting: *Deleted

## 2015-04-30 ENCOUNTER — Inpatient Hospital Stay (HOSPITAL_BASED_OUTPATIENT_CLINIC_OR_DEPARTMENT_OTHER): Admitting: Internal Medicine

## 2015-04-30 ENCOUNTER — Ambulatory Visit: Payer: Medicare Other | Admitting: Family Medicine

## 2015-04-30 ENCOUNTER — Other Ambulatory Visit: Payer: Self-pay | Admitting: Internal Medicine

## 2015-04-30 ENCOUNTER — Inpatient Hospital Stay: Attending: Internal Medicine

## 2015-04-30 VITALS — BP 127/65 | HR 101 | Temp 98.2°F | Resp 20 | Ht 62.0 in | Wt 137.8 lb

## 2015-04-30 DIAGNOSIS — Z79899 Other long term (current) drug therapy: Secondary | ICD-10-CM

## 2015-04-30 DIAGNOSIS — C3411 Malignant neoplasm of upper lobe, right bronchus or lung: Secondary | ICD-10-CM | POA: Diagnosis not present

## 2015-04-30 DIAGNOSIS — N189 Chronic kidney disease, unspecified: Secondary | ICD-10-CM | POA: Insufficient documentation

## 2015-04-30 DIAGNOSIS — D508 Other iron deficiency anemias: Secondary | ICD-10-CM

## 2015-04-30 DIAGNOSIS — E039 Hypothyroidism, unspecified: Secondary | ICD-10-CM | POA: Diagnosis not present

## 2015-04-30 DIAGNOSIS — D509 Iron deficiency anemia, unspecified: Secondary | ICD-10-CM

## 2015-04-30 DIAGNOSIS — Z87891 Personal history of nicotine dependence: Secondary | ICD-10-CM | POA: Diagnosis not present

## 2015-04-30 DIAGNOSIS — I129 Hypertensive chronic kidney disease with stage 1 through stage 4 chronic kidney disease, or unspecified chronic kidney disease: Secondary | ICD-10-CM | POA: Insufficient documentation

## 2015-04-30 DIAGNOSIS — I4891 Unspecified atrial fibrillation: Secondary | ICD-10-CM | POA: Insufficient documentation

## 2015-04-30 LAB — CBC WITH DIFFERENTIAL/PLATELET
BASOS ABS: 0 10*3/uL (ref 0–0.1)
Basophils Relative: 0 %
EOS PCT: 1 %
Eosinophils Absolute: 0.1 10*3/uL (ref 0–0.7)
HEMATOCRIT: 29.3 % — AB (ref 35.0–47.0)
HEMOGLOBIN: 9.7 g/dL — AB (ref 12.0–16.0)
LYMPHS ABS: 0.7 10*3/uL — AB (ref 1.0–3.6)
LYMPHS PCT: 10 %
MCH: 29.1 pg (ref 26.0–34.0)
MCHC: 33.1 g/dL (ref 32.0–36.0)
MCV: 88.1 fL (ref 80.0–100.0)
Monocytes Absolute: 0.6 10*3/uL (ref 0.2–0.9)
Monocytes Relative: 8 %
Neutro Abs: 5.7 10*3/uL (ref 1.4–6.5)
Neutrophils Relative %: 81 %
PLATELETS: 317 10*3/uL (ref 150–440)
RBC: 3.33 MIL/uL — AB (ref 3.80–5.20)
RDW: 19.9 % — ABNORMAL HIGH (ref 11.5–14.5)
WBC: 7.1 10*3/uL (ref 3.6–11.0)

## 2015-04-30 LAB — COMPREHENSIVE METABOLIC PANEL
ALK PHOS: 98 U/L (ref 38–126)
ALT: 17 U/L (ref 14–54)
AST: 25 U/L (ref 15–41)
Albumin: 2.9 g/dL — ABNORMAL LOW (ref 3.5–5.0)
Anion gap: 9 (ref 5–15)
BUN: 42 mg/dL — AB (ref 6–20)
CALCIUM: 8.8 mg/dL — AB (ref 8.9–10.3)
CHLORIDE: 103 mmol/L (ref 101–111)
CO2: 26 mmol/L (ref 22–32)
CREATININE: 1.95 mg/dL — AB (ref 0.44–1.00)
GFR, EST AFRICAN AMERICAN: 28 mL/min — AB (ref >60)
GFR, EST NON AFRICAN AMERICAN: 24 mL/min — AB (ref >60)
Glucose, Bld: 119 mg/dL — ABNORMAL HIGH (ref 65–99)
Potassium: 3.7 mmol/L (ref 3.5–5.1)
Sodium: 138 mmol/L (ref 135–145)
Total Bilirubin: 0.6 mg/dL (ref 0.3–1.2)
Total Protein: 8.3 g/dL — ABNORMAL HIGH (ref 6.5–8.1)

## 2015-04-30 LAB — SAMPLE TO BLOOD BANK

## 2015-04-30 LAB — T4, FREE: Free T4: 1.27 ng/dL — ABNORMAL HIGH (ref 0.61–1.12)

## 2015-04-30 LAB — TSH: TSH: 3.071 u[IU]/mL (ref 0.350–4.500)

## 2015-04-30 NOTE — Progress Notes (Signed)
[facility] OFFICE PROGRESS NOTE  Patient Care Team: Arnetha Courser, MD as PCP - General (Family Medicine) Manya Silvas, MD (Gastroenterology) Cammie Sickle, MD as Consulting Physician (Oncology) Lavonia Dana, MD as Consulting Physician (Nephrology)   SUMMARY OF ONCOLOGIC HISTORY:  # JAN 2013- RECURRENT/METASTATIC NON-SMALL CELL LUNG CA  [Right UL s/p Bronch; CT Bx- insuff] Carbo-taxol- RT   # JAN 2014- Pleural Bx- NEG/tacl pleurodesis; MARCH 2014- PET- Multifocal pleural activity ? Spread; April 2014- VERISTRAT- good; MAY 2014- Tarceva; July 2014- Tarceva Stopped sec to Declining PS- on hospice; OFF Hospice as pt is steady; CT June 2016- ? 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  Patient was in the hospital because of hemoglobin up to 7.2. She received 2 units of transfusion the hospital.  Denies any blood in stools. Continues to feel tired.  Fair appetite. Positive for weight loss.  No Diarrhea. She has mild shortness of breath/intermittent cough not any worse. No swelling in the legs. No nausea no vomiting.  As per family she is continuing spend most of the time resting /wheelchair for ambulation.   REVIEW OF SYSTEMS:  A complete 10 point review of system is done which is negative except mentioned above/history of present illness.   PAST MEDICAL HISTORY :  Past Medical  History  Diagnosis Date  . Atrial fibrillation ([place])     Dr. Humphrey Rolls, cardiologist  . Anemia   . Hypothyroidism   . Lung cancer ([place])   . Hypertension   . Lung cancer ([place])   . Anemia     PAST SURGICAL HISTORY :   Past Surgical History  Procedure Laterality Date  . Upper gastrointestinal endoscopy  02/24/14  . Esophagogastroduodenoscopy N/A 10/10/2014    Procedure: ESOPHAGOGASTRODUODENOSCOPY (EGD);  Surgeon: Manya Silvas, MD;  Location: [facility] ENDOSCOPY;  Service: Endoscopy;  Laterality: N/A;  . Colonoscopy N/A 10/11/2014    Procedure: COLONOSCOPY;  Surgeon: Manya Silvas, MD;  Location: [facility] ENDOSCOPY;  Service: Endoscopy;  Laterality: N/A;    FAMILY HISTORY :   Family History  Problem Relation Age of Onset  . Stomach cancer Mother   . Cancer Mother     unsure where but states "it spead all over"  . Alzheimer's disease Father   . Varicose Veins Daughter   . Diabetes Neg Hx   . Heart disease Neg Hx   . Hypertension Neg Hx   . Stroke Neg Hx   . COPD Neg Hx     SOCIAL HISTORY:   Social History  Substance Use Topics  . Smoking status: Former Smoker -- 0.75 packs/day for 30 years    Types: Cigarettes    Quit date: 01/03/2005  . Smokeless tobacco: Never Used  . Alcohol Use: No    ALLERGIES:  has No Known Allergies.  MEDICATIONS:  Current Outpatient Prescriptions  Medication Sig Dispense Refill  . albuterol (PROVENTIL) (2.5 MG/3ML) 0.083% nebulizer solution  Inhale 2.5 mg into the lungs every 4 (four) hours as needed.     . ALPRAZolam (XANAX) 0.25 MG tablet Take 1 tablet (0.25 mg total) by mouth at bedtime as needed for anxiety. 30 tablet 1  . amiodarone (PACERONE) 200 MG tablet Take 200 mg by mouth daily.    Marland Kitchen atorvastatin (LIPITOR) 20 MG tablet Take 20 mg by mouth at bedtime.     . benzonatate (TESSALON) 100 MG capsule Take 100 mg by mouth 3 (three) times daily as needed for cough.    . carvedilol (COREG) 3.125 MG tablet Take 6.25 mg by mouth 2 (two) times daily  with a meal.     . chlorpheniramine (ALLERGY) 4 MG tablet Take 4 mg by mouth every 4 (four) hours as needed. Reported on 01/02/2015    . Ferrous Sulfate 27 MG TABS Take 27 mg by mouth 3 (three) times daily.    Marland Kitchen gabapentin (NEURONTIN) 100 MG capsule Take 1 capsule by mouth two times daily 180 capsule 1  . isosorbide mononitrate (IMDUR) 30 MG 24 hr tablet Take 30 mg by mouth daily.    Marland Kitchen levothyroxine (SYNTHROID, LEVOTHROID) 50 MCG tablet Take 1 tablet (50 mcg total) by mouth daily. 90 tablet 0  . megestrol (MEGACE ES) 625 MG/5ML suspension Take 5 mLs (625 mg total) by mouth daily. 150 mL 3  . pantoprazole (PROTONIX) 40 MG tablet Take 40 mg by mouth daily.    . potassium chloride SA (K-DUR,KLOR-CON) 20 MEQ tablet Take 20 mEq by mouth.    . torsemide (DEMADEX) 10 MG tablet Take 1 tablet (10 mg total) by mouth daily. 90 tablet 0   No current facility-administered medications for this visit.   Facility-Administered Medications Ordered in Other Visits  Medication Dose Route Frequency Provider Last Rate Last Dose  . 0.9 %  sodium chloride infusion   Intravenous Continuous Leia Alf, MD 20 mL/hr at 07/04/14 1150    . heparin lock flush 100 unit/mL  500 Units Intravenous Once Leia Alf, MD      . heparin lock flush 100 unit/mL  500 Units Intravenous Once Leia Alf, MD      . sodium chloride 0.9 % injection 10 mL  10 mL Intravenous PRN Leia Alf, MD      . sodium chloride 0.9 % injection 10 mL  10 mL Intravenous PRN Leia Alf, MD   10 mL at 07/04/14 1145    PHYSICAL EXAMINATION: ECOG PERFORMANCE STATUS: 3 - Symptomatic, >50% confined to bed  BP 127/65 mmHg  Pulse 101  Temp(Src) 98.2 F (36.8 C) (Tympanic)  Resp 20  Ht '5\' 2"'$  (1.575 m)  Wt 137 lb 12.6 oz (62.5 kg)  BMI 25.20 kg/m2  Filed Weights   04/30/15 1506  Weight: 137 lb 12.6 oz (62.5 kg)    GENERAL:  Alert, no distress and comfortable. Thin built/moderately nourished  In a wheel chair;  She is accompanied by  husban/son.  EYES: no pallor or icterus OROPHARYNX: no thrush or ulceration; poor dentition  NECK: supple, no masses felt LYMPH:  no palpable lymphadenopathy in the cervical, axillary or inguinal regions LUNGS: Decreased breath sounds bilaterally at the bases. Scattered wheeze noted or crackles HEART/CVS: regular rate & rhythm and no murmurs; No lower extremity edema ABDOMEN:abdomen soft, non-tender and normal bowel sounds Musculoskeletal:no cyanosis of digits and no clubbing  PSYCH: alert & oriented x 3 with fluent speech NEURO: no focal motor/sensory deficits SKIN:  no rashes or significant lesions;  LABORATORY DATA:  I have reviewed the data as listed    Component Value Date/Time   NA 138 04/30/2015 1440   NA 136 08/19/2014 1337   NA 135* 02/08/2014 0844   K 3.7 04/30/2015 1440   K 4.0 02/08/2014 0844   CL 103 04/30/2015 1440   CL 105 02/08/2014 0844   CO2 26 04/30/2015 1440   CO2 21 02/08/2014 0844   GLUCOSE 119* 04/30/2015 1440   GLUCOSE 136* 08/19/2014 1337   GLUCOSE 157* 02/08/2014 0844   BUN 42* 04/30/2015 1440   BUN 32* 08/19/2014 1337   BUN 15 02/08/2014 0844   CREATININE 1.95* 04/30/2015 1440   CREATININE 1.36* 02/08/2014 0844   CALCIUM 8.8* 04/30/2015 1440   CALCIUM 8.8 02/08/2014 0844   PROT 8.3* 04/30/2015 1440   PROT 7.9 12/06/2013 1453   ALBUMIN 2.9* 04/30/2015 1440   ALBUMIN 2.8* 12/06/2013 1453   AST 25 04/30/2015 1440   AST 16 12/06/2013 1453   ALT 17 04/30/2015 1440   ALT 12* 12/06/2013 1453   ALKPHOS 98 04/30/2015 1440   ALKPHOS 82 12/06/2013 1453   BILITOT 0.6 04/30/2015 1440   BILITOT 0.3 12/06/2013 1453   GFRNONAA 24* 04/30/2015 1440   GFRNONAA 41* 02/08/2014 0844   GFRNONAA 32* 07/30/2013 1508   GFRAA 28* 04/30/2015 1440   GFRAA 49* 02/08/2014 0844   GFRAA 37* 07/30/2013 1508    No results found for: SPEP, UPEP  Lab Results  Component Value Date   WBC 7.1 04/30/2015   NEUTROABS 5.7 04/30/2015   HGB 9.7* 04/30/2015   HCT 29.3*  04/30/2015   MCV 88.1 04/30/2015   PLT 317 04/30/2015      Chemistry      Component Value Date/Time   NA 138 04/30/2015 1440   NA 136 08/19/2014 1337   NA 135* 02/08/2014 0844   K 3.7 04/30/2015 1440   K 4.0 02/08/2014 0844   CL 103 04/30/2015 1440   CL 105 02/08/2014 0844   CO2 26 04/30/2015 1440   CO2 21 02/08/2014 0844   BUN 42* 04/30/2015 1440   BUN 32* 08/19/2014 1337   BUN 15 02/08/2014 0844   CREATININE 1.95* 04/30/2015 1440   CREATININE 1.36* 02/08/2014 0844      Component Value Date/Time   CALCIUM 8.8* 04/30/2015 1440   CALCIUM 8.8 02/08/2014 0844   ALKPHOS 98 04/30/2015 1440   ALKPHOS 82 12/06/2013 1453   AST 25 04/30/2015 1440   AST 16 12/06/2013 1453   ALT 17 04/30/2015 1440   ALT 12* 12/06/2013 1453   BILITOT 0.6 04/30/2015 1440   BILITOT 0.3 12/06/2013 1453        ASSESSMENT & PLAN:   # LUNG CA- non-small cell lung stage IV; s/p Opdivo every 2 weeks x#4; PET scan MARCH 2017-right lower lobe lesion improving in size [currently 2.2 x 2.5 from 3.5 x3cm]; and the right lung continues to show extensive radiation changes.   # Given the continued decline in performance status/worsening kidney function- creatinine anywhere between 1.8-2. Patient's Opdivo is on HOLD Patient is currently in hospice. Continue hospice.   # Anemia hemoglobin 7 needing PRBC transfusion the hospital. Patient is iron deficient/patient is on by mouth iron 3 times a day - not helping. Today Hb 9.3.  plan IV iron if okay with hospice.  IV iron-for palliative reasons/improving quality of life.  # chronic kidney disease with Creatinine stable around 1.8-1.9. Today 1.95. We will send my office report to Dr.Kolluru.   #  Patient follow-up with me in 4 weeks/labs/no treatment.   # 25 minutes face-to-face with the patient discussing the above plan of care; more than 50% of time spent on natural history; counseling and coordination.     Cammie Sickle, MD 04/30/2015 3:28 PM

## 2015-05-01 ENCOUNTER — Ambulatory Visit (INDEPENDENT_AMBULATORY_CARE_PROVIDER_SITE_OTHER): Admitting: Family Medicine

## 2015-05-01 ENCOUNTER — Encounter: Payer: Self-pay | Admitting: Family Medicine

## 2015-05-01 ENCOUNTER — Telehealth: Payer: Self-pay | Admitting: *Deleted

## 2015-05-01 VITALS — BP 110/56 | HR 97 | Temp 98.4°F | Resp 14 | Ht 62.0 in | Wt 138.0 lb

## 2015-05-01 DIAGNOSIS — D509 Iron deficiency anemia, unspecified: Secondary | ICD-10-CM | POA: Diagnosis not present

## 2015-05-01 DIAGNOSIS — I129 Hypertensive chronic kidney disease with stage 1 through stage 4 chronic kidney disease, or unspecified chronic kidney disease: Secondary | ICD-10-CM | POA: Diagnosis not present

## 2015-05-01 DIAGNOSIS — N189 Chronic kidney disease, unspecified: Secondary | ICD-10-CM | POA: Diagnosis not present

## 2015-05-01 DIAGNOSIS — N184 Chronic kidney disease, stage 4 (severe): Secondary | ICD-10-CM

## 2015-05-01 DIAGNOSIS — I4891 Unspecified atrial fibrillation: Secondary | ICD-10-CM | POA: Diagnosis not present

## 2015-05-01 DIAGNOSIS — E039 Hypothyroidism, unspecified: Secondary | ICD-10-CM | POA: Diagnosis not present

## 2015-05-01 DIAGNOSIS — E038 Other specified hypothyroidism: Secondary | ICD-10-CM | POA: Diagnosis not present

## 2015-05-01 DIAGNOSIS — D649 Anemia, unspecified: Secondary | ICD-10-CM | POA: Diagnosis not present

## 2015-05-01 DIAGNOSIS — C3411 Malignant neoplasm of upper lobe, right bronchus or lung: Secondary | ICD-10-CM | POA: Diagnosis not present

## 2015-05-01 HISTORY — DX: Chronic kidney disease, stage 4 (severe): N18.4

## 2015-05-01 LAB — T3, FREE: T3 FREE: 2.2 pg/mL (ref 2.0–4.4)

## 2015-05-01 MED ORDER — LEVOTHYROXINE SODIUM 50 MCG PO TABS: ORAL_TABLET | ORAL | Status: DC

## 2015-05-01 NOTE — Assessment & Plan Note (Signed)
Continue iron supplementation; H/H holding steady after transfusion 1 unit PRBCs; she'll follow-up with GI and nephrologist; with her stage 4 CKD, I would imagine that plays a role as well

## 2015-05-01 NOTE — Telephone Encounter (Signed)
Rodena Piety, Rn contacted hospice per v/o Dr. Tish Men to see if hospice would cover intravenous iron. Per [place] at hospice triage at [phone], "hospice will not intravenous iron as this is not related to the patient's diagnosis."

## 2015-05-01 NOTE — Assessment & Plan Note (Addendum)
Reviewed last TSH and free T4; adjust medicine slightly, down 50 mcg per week total; check TSH and free T4 in 8-12 weeks

## 2015-05-01 NOTE — Progress Notes (Signed)
BP 110/56 mmHg  Pulse 97  Temp(Src) 98.4 F (36.9 C) (Oral)  Resp 14  Ht '5\' 2"'$  (1.575 m)  Wt 138 lb (62.596 kg)  BMI 25.23 kg/m2  SpO2 96%   Subjective:    Patient ID: Katrina Kramer, female    DOB: 03-08-1940, 75 y.o.   MRN: [id number]  HPI: ARLYSS Kramer is a 75 y.o. female  Chief Complaint  Patient presents with  . Hospitalization Follow-up    aniemia   Patient is here with her husband for hospital f/u Legs gave out and she was anemic again She was given a unit of blood GI saw her and did not think any active bleeding Going to see him in f/u No blood in urine or stools; no nosebleeds or gum bleeding Eats red meat some times; taking iron pills, three a day Hgb 7.9 on the 18th and back up to 9.7 yesterday Feels stronger in her legs now She has lung cancer, is under hospice care Stage 4 CKD; goes to see nephrologist next week She had her thyroid checked and free T4 was just a little high; TSH normal; not jittery; no hair loss; some dry skin; no loose stools  Depression screen PHQ 2/9 04/13/2015  Decreased Interest 0  Down, Depressed, Hopeless 0  PHQ - 2 Score 0   Relevant past medical, surgical, social history reviewed Interim medical history since our last visit reviewed Allergies and medications reviewed  Review of Systems Per HPI unless specifically indicated above     Objective:    BP 110/56 mmHg  Pulse 97  Temp(Src) 98.4 F (36.9 C) (Oral)  Resp 14  Ht '5\' 2"'$  (1.575 m)  Wt 138 lb (62.596 kg)  BMI 25.23 kg/m2  SpO2 96%  Wt Readings from Last 3 Encounters:  05/01/15 138 lb (62.596 kg)  04/30/15 137 lb 12.6 oz (62.5 kg)  04/21/15 147 lb (66.679 kg)    Physical Exam  Constitutional: She appears well-developed and well-nourished.  Cardiovascular: Normal rate and regular rhythm.   Pulmonary/Chest: Effort normal and breath sounds normal.  Musculoskeletal: She exhibits no edema.  Skin: Skin is warm. No pallor.  Psychiatric: She has a normal mood and  affect.    Results for orders placed or performed in visit on 04/30/15  CBC with Differential  Result Value Ref Range   WBC 7.1 3.6 - 11.0 K/uL   RBC 3.33 (L) 3.80 - 5.20 MIL/uL   Hemoglobin 9.7 (L) 12.0 - 16.0 g/dL   HCT 29.3 (L) 35.0 - 47.0 %   MCV 88.1 80.0 - 100.0 fL   MCH 29.1 26.0 - 34.0 pg   MCHC 33.1 32.0 - 36.0 g/dL   RDW 19.9 (H) 11.5 - 14.5 %   Platelets 317 150 - 440 K/uL   Neutrophils Relative % 81 %   Neutro Abs 5.7 1.4 - 6.5 K/uL   Lymphocytes Relative 10 %   Lymphs Abs 0.7 (L) 1.0 - 3.6 K/uL   Monocytes Relative 8 %   Monocytes Absolute 0.6 0.2 - 0.9 K/uL   Eosinophils Relative 1 %   Eosinophils Absolute 0.1 0 - 0.7 K/uL   Basophils Relative 0 %   Basophils Absolute 0.0 0 - 0.1 K/uL  Comprehensive metabolic panel  Result Value Ref Range   Sodium 138 135 - 145 mmol/L   Potassium 3.7 3.5 - 5.1 mmol/L   Chloride 103 101 - 111 mmol/L   CO2 26 22 - 32 mmol/L   Glucose,  Bld 119 (H) 65 - 99 mg/dL   BUN 42 (H) 6 - 20 mg/dL   Creatinine, Ser 1.95 (H) 0.44 - 1.00 mg/dL   Calcium 8.8 (L) 8.9 - 10.3 mg/dL   Total Protein 8.3 (H) 6.5 - 8.1 g/dL   Albumin 2.9 (L) 3.5 - 5.0 g/dL   AST 25 15 - 41 U/L   ALT 17 14 - 54 U/L   Alkaline Phosphatase 98 38 - 126 U/L   Total Bilirubin 0.6 0.3 - 1.2 mg/dL   GFR calc non Af Amer 24 (L) >60 mL/min   GFR calc Af Amer 28 (L) >60 mL/min   Anion gap 9 5 - 15  T3, free  Result Value Ref Range   T3, Free 2.2 2.0 - 4.4 pg/mL  T4, free  Result Value Ref Range   Free T4 1.27 (H) 0.61 - 1.12 ng/dL  TSH  Result Value Ref Range   TSH 3.071 0.350 - 4.500 uIU/mL  Hold Tube- Blood Bank  Result Value Ref Range   Blood Bank Specimen SAMPLE AVAILABLE FOR TESTING    Sample Expiration 05/03/2015       Assessment & Plan:   Problem List Items Addressed This Visit      Endocrine   Hypothyroidism - Primary    Reviewed last TSH and free T4; adjust medicine slightly, down 50 mcg per week total; check TSH and free T4 in 8-12 weeks       Relevant Medications   levothyroxine (SYNTHROID, LEVOTHROID) 50 MCG tablet   Other Relevant Orders   TSH   T4, free     Genitourinary   CKD (chronic kidney disease) stage 4, GFR 15-29 ml/min (HCC)    Pt will see nephrologist next week; last Cr 1.95        Other   IDA (iron deficiency anemia)    Continue iron supplementation; H/H holding steady after transfusion 1 unit PRBCs; she'll follow-up with GI and nephrologist; with her stage 4 CKD, I would imagine that plays a role as well      Relevant Medications   ferrous gluconate (FERATE) 240 (27 FE) MG tablet       Follow up plan: No Follow-up on file. --> keep next appt  An after-visit summary was printed and given to the patient at [facility].  Please see the patient instructions which may contain other information and recommendations beyond what is mentioned above in the assessment and plan.  Meds ordered this encounter  Medications  . ferrous gluconate (FERATE) 240 (27 FE) MG tablet    Sig: Take 240 mg by mouth 3 (three) times daily with meals.  Marland Kitchen levothyroxine (SYNTHROID, LEVOTHROID) 50 MCG tablet    Sig: One whole pill five days each week and one-half of a pill two days a week    Dispense:  78 tablet    Refill:  0    New instructions    Orders Placed This Encounter  Procedures  . TSH  . T4, free

## 2015-05-01 NOTE — Patient Instructions (Signed)
I am here if you need me Change the thyroid medicine just a little bit Recheck thyroid tests in about 8 weeks to 12 weeks from now, TSH and free T4

## 2015-05-01 NOTE — Assessment & Plan Note (Signed)
Pt will see nephrologist next week; last Cr 1.95

## 2015-05-03 NOTE — Assessment & Plan Note (Signed)
Avoid NSAIDs; managed by nephrologist

## 2015-05-03 NOTE — Assessment & Plan Note (Signed)
Being monitored by oncologist

## 2015-05-03 NOTE — Assessment & Plan Note (Signed)
Followed by Dr. Humphrey Rolls; sounds to be in NSR today

## 2015-05-03 NOTE — Assessment & Plan Note (Signed)
Managed by oncologist at the cancer center

## 2015-05-03 NOTE — Assessment & Plan Note (Signed)
Controlled today 

## 2015-05-03 NOTE — Assessment & Plan Note (Signed)
Monitor thyroid; sounds to be euthyroid at current dose

## 2015-05-04 DIAGNOSIS — I4891 Unspecified atrial fibrillation: Secondary | ICD-10-CM | POA: Diagnosis not present

## 2015-05-04 DIAGNOSIS — Z87891 Personal history of nicotine dependence: Secondary | ICD-10-CM | POA: Diagnosis not present

## 2015-05-04 DIAGNOSIS — I429 Cardiomyopathy, unspecified: Secondary | ICD-10-CM | POA: Diagnosis not present

## 2015-05-04 DIAGNOSIS — I509 Heart failure, unspecified: Secondary | ICD-10-CM | POA: Diagnosis not present

## 2015-05-04 DIAGNOSIS — D649 Anemia, unspecified: Secondary | ICD-10-CM | POA: Diagnosis not present

## 2015-05-04 DIAGNOSIS — C3411 Malignant neoplasm of upper lobe, right bronchus or lung: Secondary | ICD-10-CM | POA: Diagnosis not present

## 2015-05-04 DIAGNOSIS — I129 Hypertensive chronic kidney disease with stage 1 through stage 4 chronic kidney disease, or unspecified chronic kidney disease: Secondary | ICD-10-CM | POA: Diagnosis not present

## 2015-05-04 DIAGNOSIS — N189 Chronic kidney disease, unspecified: Secondary | ICD-10-CM | POA: Diagnosis not present

## 2015-05-04 DIAGNOSIS — I10 Essential (primary) hypertension: Secondary | ICD-10-CM | POA: Diagnosis not present

## 2015-05-04 DIAGNOSIS — E039 Hypothyroidism, unspecified: Secondary | ICD-10-CM | POA: Diagnosis not present

## 2015-05-06 DIAGNOSIS — E876 Hypokalemia: Secondary | ICD-10-CM | POA: Diagnosis not present

## 2015-05-06 DIAGNOSIS — I10 Essential (primary) hypertension: Secondary | ICD-10-CM | POA: Diagnosis not present

## 2015-05-06 DIAGNOSIS — D631 Anemia in chronic kidney disease: Secondary | ICD-10-CM | POA: Diagnosis not present

## 2015-05-06 DIAGNOSIS — N184 Chronic kidney disease, stage 4 (severe): Secondary | ICD-10-CM | POA: Diagnosis not present

## 2015-05-08 ENCOUNTER — Telehealth: Payer: Self-pay

## 2015-05-08 DIAGNOSIS — E039 Hypothyroidism, unspecified: Secondary | ICD-10-CM | POA: Diagnosis not present

## 2015-05-08 DIAGNOSIS — D649 Anemia, unspecified: Secondary | ICD-10-CM | POA: Diagnosis not present

## 2015-05-08 DIAGNOSIS — C3411 Malignant neoplasm of upper lobe, right bronchus or lung: Secondary | ICD-10-CM | POA: Diagnosis not present

## 2015-05-08 DIAGNOSIS — N189 Chronic kidney disease, unspecified: Secondary | ICD-10-CM | POA: Diagnosis not present

## 2015-05-08 DIAGNOSIS — I4891 Unspecified atrial fibrillation: Secondary | ICD-10-CM | POA: Diagnosis not present

## 2015-05-08 DIAGNOSIS — I129 Hypertensive chronic kidney disease with stage 1 through stage 4 chronic kidney disease, or unspecified chronic kidney disease: Secondary | ICD-10-CM | POA: Diagnosis not present

## 2015-05-08 MED ORDER — DOXYCYCLINE HYCLATE 100 MG PO TABS: 100.0000 mg | ORAL_TABLET | Freq: Two times a day (BID) | ORAL | Status: AC

## 2015-05-08 NOTE — Telephone Encounter (Signed)
Pt.notified

## 2015-05-08 NOTE — Telephone Encounter (Signed)
Husband called stated the nurse from hospice? Ask for you to call an antibiotic in for a cyst.  He stated you have done this before?

## 2015-05-08 NOTE — Telephone Encounter (Signed)
I just sent in Rx for doxy, which we used several weeks ago for an infection; I'll send another Rx in; if getting worse over weekend, call on-call doctor Try warm pads over the area 3-4 times a day for 15-20 minutes at a time to help draw it to a head

## 2015-05-11 ENCOUNTER — Other Ambulatory Visit: Payer: Self-pay | Admitting: *Deleted

## 2015-05-11 DIAGNOSIS — N189 Chronic kidney disease, unspecified: Secondary | ICD-10-CM | POA: Diagnosis not present

## 2015-05-11 DIAGNOSIS — I129 Hypertensive chronic kidney disease with stage 1 through stage 4 chronic kidney disease, or unspecified chronic kidney disease: Secondary | ICD-10-CM | POA: Diagnosis not present

## 2015-05-11 DIAGNOSIS — I4891 Unspecified atrial fibrillation: Secondary | ICD-10-CM | POA: Diagnosis not present

## 2015-05-11 DIAGNOSIS — D649 Anemia, unspecified: Secondary | ICD-10-CM | POA: Diagnosis not present

## 2015-05-11 DIAGNOSIS — E039 Hypothyroidism, unspecified: Secondary | ICD-10-CM | POA: Diagnosis not present

## 2015-05-11 DIAGNOSIS — C3411 Malignant neoplasm of upper lobe, right bronchus or lung: Secondary | ICD-10-CM | POA: Diagnosis not present

## 2015-05-11 MED ORDER — GUAIFENESIN-CODEINE 100-10 MG/5ML PO SYRP: 5.0000 mL | ORAL_SOLUTION | Freq: Three times a day (TID) | ORAL | Status: DC | PRN

## 2015-05-13 DIAGNOSIS — E039 Hypothyroidism, unspecified: Secondary | ICD-10-CM | POA: Diagnosis not present

## 2015-05-13 DIAGNOSIS — I129 Hypertensive chronic kidney disease with stage 1 through stage 4 chronic kidney disease, or unspecified chronic kidney disease: Secondary | ICD-10-CM | POA: Diagnosis not present

## 2015-05-13 DIAGNOSIS — C3411 Malignant neoplasm of upper lobe, right bronchus or lung: Secondary | ICD-10-CM | POA: Diagnosis not present

## 2015-05-13 DIAGNOSIS — D509 Iron deficiency anemia, unspecified: Secondary | ICD-10-CM | POA: Diagnosis not present

## 2015-05-13 DIAGNOSIS — N189 Chronic kidney disease, unspecified: Secondary | ICD-10-CM | POA: Diagnosis not present

## 2015-05-13 DIAGNOSIS — I4891 Unspecified atrial fibrillation: Secondary | ICD-10-CM | POA: Diagnosis not present

## 2015-05-13 DIAGNOSIS — D649 Anemia, unspecified: Secondary | ICD-10-CM | POA: Diagnosis not present

## 2015-05-15 DIAGNOSIS — N189 Chronic kidney disease, unspecified: Secondary | ICD-10-CM | POA: Diagnosis not present

## 2015-05-15 DIAGNOSIS — C3411 Malignant neoplasm of upper lobe, right bronchus or lung: Secondary | ICD-10-CM | POA: Diagnosis not present

## 2015-05-15 DIAGNOSIS — E039 Hypothyroidism, unspecified: Secondary | ICD-10-CM | POA: Diagnosis not present

## 2015-05-15 DIAGNOSIS — D649 Anemia, unspecified: Secondary | ICD-10-CM | POA: Diagnosis not present

## 2015-05-15 DIAGNOSIS — I129 Hypertensive chronic kidney disease with stage 1 through stage 4 chronic kidney disease, or unspecified chronic kidney disease: Secondary | ICD-10-CM | POA: Diagnosis not present

## 2015-05-15 DIAGNOSIS — I4891 Unspecified atrial fibrillation: Secondary | ICD-10-CM | POA: Diagnosis not present

## 2015-05-21 ENCOUNTER — Other Ambulatory Visit: Payer: Self-pay | Admitting: *Deleted

## 2015-05-21 MED ORDER — ALBUTEROL SULFATE (2.5 MG/3ML) 0.083% IN NEBU: 2.5000 mg | INHALATION_SOLUTION | RESPIRATORY_TRACT | Status: DC | PRN

## 2015-05-21 NOTE — Progress Notes (Signed)
rcvd electronic request from [facility] requesting RF on albuteral Neb 0.083% 1 vial every 4 hrs prn wheeze/shortness of breath. Requesting a total of 360 mls (2 vials of 180 ml each).  rx renewed per md order

## 2015-05-22 DIAGNOSIS — I129 Hypertensive chronic kidney disease with stage 1 through stage 4 chronic kidney disease, or unspecified chronic kidney disease: Secondary | ICD-10-CM | POA: Diagnosis not present

## 2015-05-22 DIAGNOSIS — E039 Hypothyroidism, unspecified: Secondary | ICD-10-CM | POA: Diagnosis not present

## 2015-05-22 DIAGNOSIS — N189 Chronic kidney disease, unspecified: Secondary | ICD-10-CM | POA: Diagnosis not present

## 2015-05-22 DIAGNOSIS — I4891 Unspecified atrial fibrillation: Secondary | ICD-10-CM | POA: Diagnosis not present

## 2015-05-22 DIAGNOSIS — D649 Anemia, unspecified: Secondary | ICD-10-CM | POA: Diagnosis not present

## 2015-05-22 DIAGNOSIS — C3411 Malignant neoplasm of upper lobe, right bronchus or lung: Secondary | ICD-10-CM | POA: Diagnosis not present

## 2015-05-28 ENCOUNTER — Inpatient Hospital Stay: Attending: Internal Medicine

## 2015-05-28 ENCOUNTER — Inpatient Hospital Stay (HOSPITAL_BASED_OUTPATIENT_CLINIC_OR_DEPARTMENT_OTHER): Admitting: Internal Medicine

## 2015-05-28 VITALS — BP 101/60 | HR 89 | Temp 98.0°F | Resp 20 | Ht <= 58 in | Wt 139.8 lb

## 2015-05-28 DIAGNOSIS — I129 Hypertensive chronic kidney disease with stage 1 through stage 4 chronic kidney disease, or unspecified chronic kidney disease: Secondary | ICD-10-CM | POA: Diagnosis not present

## 2015-05-28 DIAGNOSIS — D509 Iron deficiency anemia, unspecified: Secondary | ICD-10-CM

## 2015-05-28 DIAGNOSIS — E039 Hypothyroidism, unspecified: Secondary | ICD-10-CM | POA: Diagnosis not present

## 2015-05-28 DIAGNOSIS — Z79899 Other long term (current) drug therapy: Secondary | ICD-10-CM | POA: Insufficient documentation

## 2015-05-28 DIAGNOSIS — R531 Weakness: Secondary | ICD-10-CM

## 2015-05-28 DIAGNOSIS — N184 Chronic kidney disease, stage 4 (severe): Secondary | ICD-10-CM | POA: Diagnosis not present

## 2015-05-28 DIAGNOSIS — Z87891 Personal history of nicotine dependence: Secondary | ICD-10-CM | POA: Insufficient documentation

## 2015-05-28 DIAGNOSIS — C3411 Malignant neoplasm of upper lobe, right bronchus or lung: Secondary | ICD-10-CM | POA: Diagnosis not present

## 2015-05-28 DIAGNOSIS — I4891 Unspecified atrial fibrillation: Secondary | ICD-10-CM | POA: Diagnosis not present

## 2015-05-28 LAB — CBC WITH DIFFERENTIAL/PLATELET
Basophils Absolute: 0 10*3/uL (ref 0–0.1)
Basophils Relative: 1 %
EOS ABS: 0.1 10*3/uL (ref 0–0.7)
EOS PCT: 2 %
HCT: 24.8 % — ABNORMAL LOW (ref 35.0–47.0)
Hemoglobin: 8.1 g/dL — ABNORMAL LOW (ref 12.0–16.0)
LYMPHS ABS: 0.8 10*3/uL — AB (ref 1.0–3.6)
Lymphocytes Relative: 15 %
MCH: 28.4 pg (ref 26.0–34.0)
MCHC: 32.7 g/dL (ref 32.0–36.0)
MCV: 86.8 fL (ref 80.0–100.0)
MONOS PCT: 9 %
Monocytes Absolute: 0.5 10*3/uL (ref 0.2–0.9)
Neutro Abs: 3.9 10*3/uL (ref 1.4–6.5)
Neutrophils Relative %: 73 %
PLATELETS: 290 10*3/uL (ref 150–440)
RBC: 2.85 MIL/uL — ABNORMAL LOW (ref 3.80–5.20)
RDW: 20.1 % — ABNORMAL HIGH (ref 11.5–14.5)
WBC: 5.3 10*3/uL (ref 3.6–11.0)

## 2015-05-28 LAB — COMPREHENSIVE METABOLIC PANEL
ALT: 14 U/L (ref 14–54)
ANION GAP: 6 (ref 5–15)
AST: 24 U/L (ref 15–41)
Albumin: 2.8 g/dL — ABNORMAL LOW (ref 3.5–5.0)
Alkaline Phosphatase: 83 U/L (ref 38–126)
BUN: 32 mg/dL — ABNORMAL HIGH (ref 6–20)
CHLORIDE: 102 mmol/L (ref 101–111)
CO2: 26 mmol/L (ref 22–32)
Calcium: 8.5 mg/dL — ABNORMAL LOW (ref 8.9–10.3)
Creatinine, Ser: 1.97 mg/dL — ABNORMAL HIGH (ref 0.44–1.00)
GFR calc non Af Amer: 24 mL/min — ABNORMAL LOW (ref >60)
GFR, EST AFRICAN AMERICAN: 28 mL/min — AB (ref >60)
Glucose, Bld: 154 mg/dL — ABNORMAL HIGH (ref 65–99)
Potassium: 3.7 mmol/L (ref 3.5–5.1)
SODIUM: 134 mmol/L — AB (ref 135–145)
Total Bilirubin: 0.6 mg/dL (ref 0.3–1.2)
Total Protein: 8.1 g/dL (ref 6.5–8.1)

## 2015-05-28 NOTE — Progress Notes (Signed)
[place] OFFICE PROGRESS NOTE  Patient Care Team: Arnetha Courser, MD as PCP - General (Family Medicine) Manya Silvas, MD (Gastroenterology) Cammie Sickle, MD as Consulting Physician (Oncology) Lavonia Dana, MD as Consulting Physician (Nephrology)   SUMMARY OF ONCOLOGIC HISTORY:  # JAN 2013- RECURRENT/METASTATIC NON-SMALL CELL LUNG CA  [Right UL s/p Bronch; CT Bx- insuff] Carbo-taxol- RT   # JAN 2014- Pleural Bx- NEG/tacl pleurodesis; MARCH 2014- PET- Multifocal pleural activity ? Spread; April 2014- VERISTRAT- good; MAY 2014- Tarceva; July 2014- Tarceva Stopped sec to Declining PS- on hospice; OFF Hospice as pt is steady; CT June 2016- ? 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  Patient continues to feel weak- however this is not getting any worse. She goes around with a walker. No new worsening shortness of breath or cough.  As per family she is continuing spend most of the time resting /wheelchair for ambulation.   REVIEW OF SYSTEMS:  A complete 10 point review of system is done which is negative except mentioned above/history of present illness.   PAST MEDICAL HISTORY :  Past Medical History  Diagnosis Date  . Atrial fibrillation ([place])     Dr. Humphrey Rolls, cardiologist  . Anemia   . Hypothyroidism   . Lung cancer ([place])   . Hypertension   . Lung cancer ([place])   . Anemia   .  CKD (chronic kidney disease) stage 4, GFR 15-29 ml/min (HCC) 05/01/2015    PAST SURGICAL HISTORY :   Past Surgical History  Procedure Laterality Date  . Upper gastrointestinal endoscopy  02/24/14  . Esophagogastroduodenoscopy N/A 10/10/2014    Procedure: ESOPHAGOGASTRODUODENOSCOPY (EGD);  Surgeon: Manya Silvas, MD;  Location: [facility] ENDOSCOPY;  Service: Endoscopy;  Laterality: N/A;  . Colonoscopy N/A 10/11/2014    Procedure: COLONOSCOPY;  Surgeon: Manya Silvas, MD;  Location: [facility] ENDOSCOPY;  Service: Endoscopy;  Laterality: N/A;    FAMILY HISTORY :   Family History  Problem Relation Age of Onset  . Stomach cancer Mother   . Cancer Mother     unsure where but states "it spead all over"  . Alzheimer's disease Father   . Varicose Veins Daughter   . Diabetes Neg Hx   . Heart disease Neg Hx   . Hypertension Neg Hx   . Stroke Neg Hx   . COPD Neg Hx     SOCIAL HISTORY:   Social History  Substance Use Topics  . Smoking status: Former Smoker -- 0.75 packs/day for 30 years    Types: Cigarettes    Quit date: 01/03/2005  . Smokeless tobacco: Never Used  . Alcohol Use: No    ALLERGIES:  has No Known Allergies.  MEDICATIONS:  Current Outpatient Prescriptions  Medication Sig Dispense Refill  . albuterol (PROVENTIL) (2.5 MG/3ML) 0.083% nebulizer solution Inhale 3 mLs (2.5 mg total) into the lungs every 4 (four) hours as needed for wheezing or shortness of  breath. 360 mL 3  . atorvastatin (LIPITOR) 20 MG tablet Take 20 mg by mouth at bedtime.     . benzonatate (TESSALON) 100 MG capsule Take 100 mg by mouth 3 (three) times daily as needed for cough.    . carvedilol (COREG) 3.125 MG tablet Take 6.25 mg by mouth 2 (two) times daily with a meal.     . chlorpheniramine (ALLERGY) 4 MG tablet Take 4 mg by mouth every 4 (four) hours as needed. Reported on 01/02/2015    . ferrous gluconate (FERATE) 240 (27 FE) MG tablet Take 240 mg by mouth 3 (three) times daily with meals.    . Ferrous  Sulfate 27 MG TABS Take 27 mg by mouth 3 (three) times daily.    Marland Kitchen gabapentin (NEURONTIN) 100 MG capsule Take 1 capsule by mouth two times daily 180 capsule 1  . guaiFENesin-codeine (ROBITUSSIN AC) 100-10 MG/5ML syrup Take 5 mLs by mouth 3 (three) times daily as needed for cough. 120 mL 0  . isosorbide mononitrate (IMDUR) 30 MG 24 hr tablet Take 30 mg by mouth daily.    Marland Kitchen levothyroxine (SYNTHROID, LEVOTHROID) 50 MCG tablet One whole pill five days each week and one-half of a pill two days a week 78 tablet 0  . megestrol (MEGACE) 400 MG/10ML suspension Take 5 mg by mouth daily.    . Multiple Vitamin (MULTIVITAMIN WITH MINERALS) TABS tablet Take 1 tablet by mouth daily.    . pantoprazole (PROTONIX) 40 MG tablet Take 40 mg by mouth daily.    . potassium chloride SA (K-DUR,KLOR-CON) 20 MEQ tablet Take 20 mEq by mouth.    . torsemide (DEMADEX) 10 MG tablet Take 1 tablet (10 mg total) by mouth daily. 90 tablet 0   No current facility-administered medications for this visit.   Facility-Administered Medications Ordered in Other Visits  Medication Dose Route Frequency Provider Last Rate Last Dose  . 0.9 %  sodium chloride infusion   Intravenous Continuous Leia Alf, MD 20 mL/hr at 07/04/14 1150    . heparin lock flush 100 unit/mL  500 Units Intravenous Once Leia Alf, MD      . heparin lock flush 100 unit/mL  500 Units Intravenous Once Leia Alf, MD      . sodium chloride 0.9 % injection 10 mL  10 mL Intravenous PRN Leia Alf, MD      . sodium chloride 0.9 % injection 10 mL  10 mL Intravenous PRN Leia Alf, MD   10 mL at 07/04/14 1145    PHYSICAL EXAMINATION: ECOG PERFORMANCE STATUS: 3 - Symptomatic, >50% confined to bed  BP 101/60 mmHg  Pulse 89  Temp(Src) 98 F (36.7 C) (Tympanic)  Resp 20  Ht '4\' 9"'$  (1.448 m)  Wt 139 lb 12.4 oz (63.4 kg)  BMI 30.24 kg/m2  Filed Weights   05/28/15 1548  Weight: 139 lb 12.4 oz (63.4 kg)    GENERAL:  Alert, no distress and  comfortable. Thin built/moderately nourished  In a wheel chair;  She is accompanied by husband.  EYES: no pallor or icterus OROPHARYNX: no thrush or ulceration; poor dentition  NECK: supple, no masses felt LYMPH:  no palpable lymphadenopathy in the cervical, axillary or inguinal regions LUNGS: Decreased breath sounds bilaterally at the bases. Scattered wheeze noted or crackles HEART/CVS: regular rate & rhythm and no murmurs; No lower extremity edema ABDOMEN:abdomen soft, non-tender and normal bowel sounds Musculoskeletal:no cyanosis of digits and no clubbing  PSYCH: alert & oriented x 3 with fluent  speech NEURO: no focal motor/sensory deficits SKIN:  no rashes or significant lesions;    LABORATORY DATA:  I have reviewed the data as listed    Component Value Date/Time   NA 134* 05/28/2015 1519   NA 136 08/19/2014 1337   NA 135* 02/08/2014 0844   K 3.7 05/28/2015 1519   K 4.0 02/08/2014 0844   CL 102 05/28/2015 1519   CL 105 02/08/2014 0844   CO2 26 05/28/2015 1519   CO2 21 02/08/2014 0844   GLUCOSE 154* 05/28/2015 1519   GLUCOSE 136* 08/19/2014 1337   GLUCOSE 157* 02/08/2014 0844   BUN 32* 05/28/2015 1519   BUN 32* 08/19/2014 1337   BUN 15 02/08/2014 0844   CREATININE 1.97* 05/28/2015 1519   CREATININE 1.36* 02/08/2014 0844   CALCIUM 8.5* 05/28/2015 1519   CALCIUM 8.8 02/08/2014 0844   PROT 8.1 05/28/2015 1519   PROT 7.9 12/06/2013 1453   ALBUMIN 2.8* 05/28/2015 1519   ALBUMIN 2.8* 12/06/2013 1453   AST 24 05/28/2015 1519   AST 16 12/06/2013 1453   ALT 14 05/28/2015 1519   ALT 12* 12/06/2013 1453   ALKPHOS 83 05/28/2015 1519   ALKPHOS 82 12/06/2013 1453   BILITOT 0.6 05/28/2015 1519   BILITOT 0.3 12/06/2013 1453   GFRNONAA 24* 05/28/2015 1519   GFRNONAA 41* 02/08/2014 0844   GFRNONAA 32* 07/30/2013 1508   GFRAA 28* 05/28/2015 1519   GFRAA 49* 02/08/2014 0844   GFRAA 37* 07/30/2013 1508    No results found for: SPEP, UPEP  Lab Results  Component Value Date    WBC 5.3 05/28/2015   NEUTROABS 3.9 05/28/2015   HGB 8.1* 05/28/2015   HCT 24.8* 05/28/2015   MCV 86.8 05/28/2015   PLT 290 05/28/2015      Chemistry      Component Value Date/Time   NA 134* 05/28/2015 1519   NA 136 08/19/2014 1337   NA 135* 02/08/2014 0844   K 3.7 05/28/2015 1519   K 4.0 02/08/2014 0844   CL 102 05/28/2015 1519   CL 105 02/08/2014 0844   CO2 26 05/28/2015 1519   CO2 21 02/08/2014 0844   BUN 32* 05/28/2015 1519   BUN 32* 08/19/2014 1337   BUN 15 02/08/2014 0844   CREATININE 1.97* 05/28/2015 1519   CREATININE 1.36* 02/08/2014 0844      Component Value Date/Time   CALCIUM 8.5* 05/28/2015 1519   CALCIUM 8.8 02/08/2014 0844   ALKPHOS 83 05/28/2015 1519   ALKPHOS 82 12/06/2013 1453   AST 24 05/28/2015 1519   AST 16 12/06/2013 1453   ALT 14 05/28/2015 1519   ALT 12* 12/06/2013 1453   BILITOT 0.6 05/28/2015 1519   BILITOT 0.3 12/06/2013 1453        ASSESSMENT & PLAN:   # LUNG CA- non-small cell lung stage IV; s/p Opdivo every 2 weeks x#4; PET scan MARCH 2017-right lower lobe lesion improving in size [currently 2.2 x 2.5 from 3.5 x3cm]; and the right lung continues to show extensive radiation changes.  Patient's treatments are currently on hold because of declining performance status;  worsening renal function.  # Clinically patient is stable; continue monitoring for now. No obvious progression of her lung cancer on a clinical basis. Continue hospice care.  # Hemoglobin is 8.1; patient is iron deficient saturations-9%; patient is on by mouth iron 3 times a day. Not helping. Hospice would not cover IV iron. We'll plan to transfuse 2 units PRBC if needed.   #  chronic kidney disease with Creatinine stable around 1.8-1.9. Today 1.95. Stable.  # Patient follow-up with me in 4 weeks/labs/no treatment.   # 15 minutes face-to-face with the patient discussing the above plan of care; more than 50% of time spent on natural history; counseling and coordination.    Cammie Sickle, MD 05/28/2015 4:05 PM

## 2015-05-28 NOTE — Progress Notes (Signed)
Patient here for follow up. Patients family has questions about insurance.

## 2015-05-29 DIAGNOSIS — N189 Chronic kidney disease, unspecified: Secondary | ICD-10-CM | POA: Diagnosis not present

## 2015-05-29 DIAGNOSIS — I129 Hypertensive chronic kidney disease with stage 1 through stage 4 chronic kidney disease, or unspecified chronic kidney disease: Secondary | ICD-10-CM | POA: Diagnosis not present

## 2015-05-29 DIAGNOSIS — C3411 Malignant neoplasm of upper lobe, right bronchus or lung: Secondary | ICD-10-CM | POA: Diagnosis not present

## 2015-05-29 DIAGNOSIS — I4891 Unspecified atrial fibrillation: Secondary | ICD-10-CM | POA: Diagnosis not present

## 2015-05-29 DIAGNOSIS — E039 Hypothyroidism, unspecified: Secondary | ICD-10-CM | POA: Diagnosis not present

## 2015-05-29 DIAGNOSIS — D649 Anemia, unspecified: Secondary | ICD-10-CM | POA: Diagnosis not present

## 2015-06-04 DIAGNOSIS — I129 Hypertensive chronic kidney disease with stage 1 through stage 4 chronic kidney disease, or unspecified chronic kidney disease: Secondary | ICD-10-CM | POA: Diagnosis not present

## 2015-06-04 DIAGNOSIS — C3411 Malignant neoplasm of upper lobe, right bronchus or lung: Secondary | ICD-10-CM | POA: Diagnosis not present

## 2015-06-04 DIAGNOSIS — I4891 Unspecified atrial fibrillation: Secondary | ICD-10-CM | POA: Diagnosis not present

## 2015-06-04 DIAGNOSIS — E039 Hypothyroidism, unspecified: Secondary | ICD-10-CM | POA: Diagnosis not present

## 2015-06-04 DIAGNOSIS — D649 Anemia, unspecified: Secondary | ICD-10-CM | POA: Diagnosis not present

## 2015-06-04 DIAGNOSIS — Z87891 Personal history of nicotine dependence: Secondary | ICD-10-CM | POA: Diagnosis not present

## 2015-06-04 DIAGNOSIS — N189 Chronic kidney disease, unspecified: Secondary | ICD-10-CM | POA: Diagnosis not present

## 2015-06-05 ENCOUNTER — Other Ambulatory Visit: Payer: Self-pay | Admitting: *Deleted

## 2015-06-05 DIAGNOSIS — C3411 Malignant neoplasm of upper lobe, right bronchus or lung: Secondary | ICD-10-CM | POA: Diagnosis not present

## 2015-06-05 DIAGNOSIS — N189 Chronic kidney disease, unspecified: Secondary | ICD-10-CM | POA: Diagnosis not present

## 2015-06-05 DIAGNOSIS — I129 Hypertensive chronic kidney disease with stage 1 through stage 4 chronic kidney disease, or unspecified chronic kidney disease: Secondary | ICD-10-CM | POA: Diagnosis not present

## 2015-06-05 DIAGNOSIS — I4891 Unspecified atrial fibrillation: Secondary | ICD-10-CM | POA: Diagnosis not present

## 2015-06-05 DIAGNOSIS — D649 Anemia, unspecified: Secondary | ICD-10-CM | POA: Diagnosis not present

## 2015-06-05 DIAGNOSIS — E039 Hypothyroidism, unspecified: Secondary | ICD-10-CM | POA: Diagnosis not present

## 2015-06-05 MED ORDER — GUAIFENESIN-CODEINE 100-10 MG/5ML PO SYRP: 5.0000 mL | ORAL_SOLUTION | Freq: Three times a day (TID) | ORAL | Status: DC | PRN

## 2015-06-05 NOTE — Telephone Encounter (Signed)
Would like to discuss changing her appetite meds

## 2015-06-05 NOTE — Telephone Encounter (Signed)
Ok to refill cough med Add Prednisone 20 mg daily times 10 days Continue megace too. Mitzi informed

## 2015-06-12 DIAGNOSIS — I129 Hypertensive chronic kidney disease with stage 1 through stage 4 chronic kidney disease, or unspecified chronic kidney disease: Secondary | ICD-10-CM | POA: Diagnosis not present

## 2015-06-12 DIAGNOSIS — C3411 Malignant neoplasm of upper lobe, right bronchus or lung: Secondary | ICD-10-CM | POA: Diagnosis not present

## 2015-06-12 DIAGNOSIS — I4891 Unspecified atrial fibrillation: Secondary | ICD-10-CM | POA: Diagnosis not present

## 2015-06-12 DIAGNOSIS — N189 Chronic kidney disease, unspecified: Secondary | ICD-10-CM | POA: Diagnosis not present

## 2015-06-12 DIAGNOSIS — D649 Anemia, unspecified: Secondary | ICD-10-CM | POA: Diagnosis not present

## 2015-06-12 DIAGNOSIS — E039 Hypothyroidism, unspecified: Secondary | ICD-10-CM | POA: Diagnosis not present

## 2015-06-18 ENCOUNTER — Other Ambulatory Visit: Payer: Self-pay | Admitting: *Deleted

## 2015-06-18 DIAGNOSIS — R059 Cough, unspecified: Secondary | ICD-10-CM

## 2015-06-18 DIAGNOSIS — D649 Anemia, unspecified: Secondary | ICD-10-CM | POA: Diagnosis not present

## 2015-06-18 DIAGNOSIS — E039 Hypothyroidism, unspecified: Secondary | ICD-10-CM | POA: Diagnosis not present

## 2015-06-18 DIAGNOSIS — N189 Chronic kidney disease, unspecified: Secondary | ICD-10-CM | POA: Diagnosis not present

## 2015-06-18 DIAGNOSIS — R05 Cough: Secondary | ICD-10-CM

## 2015-06-18 DIAGNOSIS — C3411 Malignant neoplasm of upper lobe, right bronchus or lung: Secondary | ICD-10-CM

## 2015-06-18 DIAGNOSIS — I4891 Unspecified atrial fibrillation: Secondary | ICD-10-CM | POA: Diagnosis not present

## 2015-06-18 DIAGNOSIS — I129 Hypertensive chronic kidney disease with stage 1 through stage 4 chronic kidney disease, or unspecified chronic kidney disease: Secondary | ICD-10-CM | POA: Diagnosis not present

## 2015-06-18 MED ORDER — GUAIFENESIN-CODEINE 100-10 MG/5ML PO SYRP: 5.0000 mL | ORAL_SOLUTION | Freq: Three times a day (TID) | ORAL | Status: DC | PRN

## 2015-06-18 NOTE — Telephone Encounter (Signed)
Patient got Robitussin AC #120 ml on 6/2, I have a call in for the Hospice nurse to return my call regarding cough med use and her need for it.

## 2015-06-18 NOTE — Progress Notes (Signed)
rcvd rx renewal for from [facility]. For Robitussin AC v/o Dr. Rogue Bussing.

## 2015-06-19 ENCOUNTER — Other Ambulatory Visit: Payer: Self-pay | Admitting: Family Medicine

## 2015-06-19 DIAGNOSIS — D649 Anemia, unspecified: Secondary | ICD-10-CM | POA: Diagnosis not present

## 2015-06-19 DIAGNOSIS — I129 Hypertensive chronic kidney disease with stage 1 through stage 4 chronic kidney disease, or unspecified chronic kidney disease: Secondary | ICD-10-CM | POA: Diagnosis not present

## 2015-06-19 DIAGNOSIS — I4891 Unspecified atrial fibrillation: Secondary | ICD-10-CM | POA: Diagnosis not present

## 2015-06-19 DIAGNOSIS — E039 Hypothyroidism, unspecified: Secondary | ICD-10-CM | POA: Diagnosis not present

## 2015-06-19 DIAGNOSIS — C3411 Malignant neoplasm of upper lobe, right bronchus or lung: Secondary | ICD-10-CM | POA: Diagnosis not present

## 2015-06-19 DIAGNOSIS — N189 Chronic kidney disease, unspecified: Secondary | ICD-10-CM | POA: Diagnosis not present

## 2015-06-19 NOTE — Telephone Encounter (Signed)
I poke with Mitzi and let her know that Rx was sent to pharmacy and that per Dr B, ok to try Delsym. She will have pt to alternate Delsym and Robitussin AC

## 2015-06-19 NOTE — Telephone Encounter (Signed)
Robitussin AC called into [facility] on 06/18/15 at 1630. RN spoke with pharmacist at [facility].

## 2015-06-19 NOTE — Telephone Encounter (Signed)
Per Mitzi, patient has a dry cough from a tumor pressing on area. Do you want to have her try some Delsym? Or continue Robitussin AC? She is also using [facility]

## 2015-06-25 ENCOUNTER — Inpatient Hospital Stay: Attending: Oncology

## 2015-06-25 ENCOUNTER — Inpatient Hospital Stay

## 2015-06-25 ENCOUNTER — Inpatient Hospital Stay (HOSPITAL_BASED_OUTPATIENT_CLINIC_OR_DEPARTMENT_OTHER): Admitting: Oncology

## 2015-06-25 VITALS — BP 119/71 | HR 101 | Temp 98.7°F | Ht <= 58 in | Wt 145.4 lb

## 2015-06-25 DIAGNOSIS — N184 Chronic kidney disease, stage 4 (severe): Secondary | ICD-10-CM

## 2015-06-25 DIAGNOSIS — C3411 Malignant neoplasm of upper lobe, right bronchus or lung: Secondary | ICD-10-CM

## 2015-06-25 DIAGNOSIS — I129 Hypertensive chronic kidney disease with stage 1 through stage 4 chronic kidney disease, or unspecified chronic kidney disease: Secondary | ICD-10-CM | POA: Insufficient documentation

## 2015-06-25 DIAGNOSIS — D508 Other iron deficiency anemias: Secondary | ICD-10-CM

## 2015-06-25 DIAGNOSIS — Z79899 Other long term (current) drug therapy: Secondary | ICD-10-CM | POA: Diagnosis not present

## 2015-06-25 DIAGNOSIS — I4891 Unspecified atrial fibrillation: Secondary | ICD-10-CM | POA: Insufficient documentation

## 2015-06-25 DIAGNOSIS — D631 Anemia in chronic kidney disease: Secondary | ICD-10-CM | POA: Diagnosis not present

## 2015-06-25 DIAGNOSIS — D509 Iron deficiency anemia, unspecified: Secondary | ICD-10-CM

## 2015-06-25 DIAGNOSIS — E039 Hypothyroidism, unspecified: Secondary | ICD-10-CM | POA: Diagnosis not present

## 2015-06-25 DIAGNOSIS — Z87891 Personal history of nicotine dependence: Secondary | ICD-10-CM | POA: Diagnosis not present

## 2015-06-25 DIAGNOSIS — N183 Chronic kidney disease, stage 3 unspecified: Secondary | ICD-10-CM | POA: Insufficient documentation

## 2015-06-25 LAB — CBC WITH DIFFERENTIAL/PLATELET
BASOS ABS: 0 10*3/uL (ref 0–0.1)
BASOS PCT: 1 %
Eosinophils Absolute: 0.1 10*3/uL (ref 0–0.7)
Eosinophils Relative: 1 %
HEMATOCRIT: 24.2 % — AB (ref 35.0–47.0)
HEMOGLOBIN: 7.9 g/dL — AB (ref 12.0–16.0)
Lymphocytes Relative: 12 %
Lymphs Abs: 0.7 10*3/uL — ABNORMAL LOW (ref 1.0–3.6)
MCH: 28.5 pg (ref 26.0–34.0)
MCHC: 32.5 g/dL (ref 32.0–36.0)
MCV: 87.7 fL (ref 80.0–100.0)
Monocytes Absolute: 0.4 10*3/uL (ref 0.2–0.9)
Monocytes Relative: 7 %
NEUTROS ABS: 4.4 10*3/uL (ref 1.4–6.5)
NEUTROS PCT: 79 %
Platelets: 290 10*3/uL (ref 150–440)
RBC: 2.76 MIL/uL — ABNORMAL LOW (ref 3.80–5.20)
RDW: 20.8 % — AB (ref 11.5–14.5)
WBC: 5.5 10*3/uL (ref 3.6–11.0)

## 2015-06-25 LAB — BASIC METABOLIC PANEL
ANION GAP: 8 (ref 5–15)
BUN: 25 mg/dL — ABNORMAL HIGH (ref 6–20)
CALCIUM: 8.2 mg/dL — AB (ref 8.9–10.3)
CHLORIDE: 102 mmol/L (ref 101–111)
CO2: 24 mmol/L (ref 22–32)
CREATININE: 1.81 mg/dL — AB (ref 0.44–1.00)
GFR calc Af Amer: 31 mL/min — ABNORMAL LOW (ref >60)
GFR calc non Af Amer: 27 mL/min — ABNORMAL LOW (ref >60)
GLUCOSE: 262 mg/dL — AB (ref 65–99)
Potassium: 3.7 mmol/L (ref 3.5–5.1)
SODIUM: 134 mmol/L — AB (ref 135–145)

## 2015-06-25 LAB — SAMPLE TO BLOOD BANK

## 2015-06-25 NOTE — Progress Notes (Signed)
[facility]  [phone]  [phone]) [phone]     Katrina Kramer DOB: 1940/08/04  MR#: [id number]  NID#:[id number]  Patient Care Team: Arnetha Courser, MD as PCP - General (Family Medicine) Manya Silvas, MD (Gastroenterology) Cammie Sickle, MD as Consulting Physician (Oncology) Lavonia Dana, MD as Consulting Physician (Nephrology)  CHIEF COMPLAINT:  Chief Complaint  Patient presents with  . Malignant neoplasm of upper lobe of right lung ([place])   SUMMARY OF ONCOLOGIC HISTORY:  # JAN 2013- RECURRENT/METASTATIC NON-SMALL CELL LUNG CA [Right UL s/p Bronch; CT Bx- insuff] Carbo-taxol- RT   # JAN 2014- Pleural Bx- NEG/tacl pleurodesis; MARCH 2014- PET- Multifocal pleural activity ? Spread; April 2014- VERISTRAT- good; MAY 2014- Tarceva; July 2014- Tarceva Stopped sec to Declining PS- on hospice; OFF Hospice as pt is steady; CT June 2016- ? 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 She currently feels well and is without complaints. She does continue to feel weak- however this is not getting any worse. She goes around with a walker. No new worsening shortness of breath or cough.   REVIEW OF SYSTEMS:   Review of Systems  Constitutional: Positive for malaise/fatigue.  HENT: Negative.   Eyes: Negative.   Respiratory: Positive for cough and shortness of breath.   Cardiovascular: Negative.   Gastrointestinal: Negative.   Genitourinary: Negative.   Musculoskeletal: Negative.   Neurological: Positive for weakness.    Psychiatric/Behavioral: Negative.     As per HPI. Otherwise, a complete review of systems is negatve.  ONCOLOGY HISTORY: Oncology History   1. 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 CT-guided biopsy on 02/10/11 - insufficient for definite diagnosis. Rare group of atypical cells. Got chemotherapy with Paclitaxel/Carboplatin (started on 02/14/11, completed cycle 6 day 8 on 08/22/11). Also got concurrent radiation. 01/18/12 - Underwent Preoperative bronchoscopy with bronchoalveolar lavage right upper lobe. Right thoracoscopy with pleural biopsy and talc pleurodesis. Negative for malignancy. PET scan on 03/29/12 IMPRESSION: Multifocal pleural activity in the right chest suggesting pleural tumor spread. Activity is also noted in the right hilar region suggesting persistent tumor in this region. April 04, 2012 - VERISTRAT test reported as 'GOOD'. Started Tarceva on 05/17/12. July 2014 - declining performance status, unable to tolerate low dose Tarceva, was on home hospice which was discontinued since patient doing steady.     Lung cancer ([place])    Initial Diagnosis Lung cancer [facility])    PAST MEDICAL HISTORY: Past Medical History  Diagnosis Date  . Atrial fibrillation ([place])     Dr. Humphrey Rolls, cardiologist  . Anemia   . Hypothyroidism   . Lung cancer ([place])   . Hypertension   . Lung cancer ([place])   . Anemia   . CKD (chronic kidney disease) stage 4, GFR 15-29 ml/min (HCC) 05/01/2015    PAST SURGICAL HISTORY: Past Surgical History  Procedure Laterality Date  . Upper gastrointestinal endoscopy  02/24/14  . Esophagogastroduodenoscopy N/A 10/10/2014    Procedure:  ESOPHAGOGASTRODUODENOSCOPY (EGD);  Surgeon: Manya Silvas, MD;  Location: [facility] ENDOSCOPY;  Service: Endoscopy;  Laterality: N/A;  . Colonoscopy N/A 10/11/2014    Procedure: COLONOSCOPY;   Surgeon: Manya Silvas, MD;  Location: [facility] ENDOSCOPY;  Service: Endoscopy;  Laterality: N/A;    FAMILY HISTORY Family History  Problem Relation Age of Onset  . Stomach cancer Mother   . Cancer Mother     unsure where but states "it spead all over"  . Alzheimer's disease Father   . Varicose Veins Daughter   . Diabetes Neg Hx   . Heart disease Neg Hx   . Hypertension Neg Hx   . Stroke Neg Hx   . COPD Neg Hx     GYNECOLOGIC HISTORY:  No LMP recorded. Patient is postmenopausal.     ADVANCED DIRECTIVES:    HEALTH MAINTENANCE: Social History  Substance Use Topics  . Smoking status: Former Smoker -- 0.75 packs/day for 30 years    Types: Cigarettes    Quit date: 01/03/2005  . Smokeless tobacco: Never Used  . Alcohol Use: No    No Known Allergies  Current Outpatient Prescriptions  Medication Sig Dispense Refill  . albuterol (PROVENTIL) (2.5 MG/3ML) 0.083% nebulizer solution Inhale 3 mLs (2.5 mg total) into the lungs every 4 (four) hours as needed for wheezing or shortness of breath. 360 mL 3  . atorvastatin (LIPITOR) 20 MG tablet Take 20 mg by mouth at bedtime.     . benzonatate (TESSALON) 100 MG capsule Take 100 mg by mouth 3 (three) times daily as needed for cough.    . carvedilol (COREG) 3.125 MG tablet Take 6.25 mg by mouth 2 (two) times daily with a meal.     . gabapentin (NEURONTIN) 100 MG capsule Take 1 capsule by mouth two times daily 180 capsule 1  . guaiFENesin-codeine (ROBITUSSIN AC) 100-10 MG/5ML syrup Take 5 mLs by mouth 3 (three) times daily as needed for cough. 120 mL 0  . isosorbide mononitrate (IMDUR) 30 MG 24 hr tablet Take 30 mg by mouth daily.    Marland Kitchen levothyroxine (SYNTHROID, LEVOTHROID) 50 MCG tablet Take 1 tablet by mouth  daily for 5 days each week  and take one-half tablet by mouth daily for 2 days each week 78 tablet 0  . megestrol (MEGACE) 400 MG/10ML suspension Take 5 mg by mouth daily.    . Multiple Vitamin (MULTIVITAMIN WITH MINERALS) TABS tablet  Take 1 tablet by mouth daily.    . pantoprazole (PROTONIX) 40 MG tablet Take 40 mg by mouth daily.    . potassium chloride SA (K-DUR,KLOR-CON) 20 MEQ tablet Take 20 mEq by mouth.    . torsemide (DEMADEX) 10 MG tablet Take 1 tablet (10 mg total) by mouth daily. 90 tablet 0  . chlorpheniramine (ALLERGY) 4 MG tablet Take 4 mg by mouth every 4 (four) hours as needed. Reported on 01/02/2015    . ferrous gluconate (FERATE) 240 (27 FE) MG tablet Take 240 mg by mouth 3 (three) times daily with meals.     No current facility-administered medications for this visit.   Facility-Administered Medications Ordered in Other Visits  Medication Dose Route Frequency Provider Last Rate Last Dose  . 0.9 %  sodium chloride infusion   Intravenous Continuous Leia Alf, MD 20 mL/hr at 07/04/14 1150    . heparin lock flush 100 unit/mL  500 Units Intravenous Once Leia Alf, MD      . heparin lock flush 100 unit/mL  500 Units Intravenous Once  Leia Alf, MD      . sodium chloride 0.9 % injection 10 mL  10 mL Intravenous PRN Leia Alf, MD      . sodium chloride 0.9 % injection 10 mL  10 mL Intravenous PRN Leia Alf, MD   10 mL at 07/04/14 1145    OBJECTIVE: BP 119/71 mmHg  Pulse 101  Temp(Src) 98.7 F (37.1 C) (Oral)  Ht 4' 9"  (1.448 m)  Wt 145 lb 6 oz (65.942 kg)  BMI 31.45 kg/m2   Body mass index is 31.45 kg/(m^2).    ECOG FS:3 - Symptomatic, >50% confined to bed  GENERAL: Alert, no distress. In a wheel chair;comfortable. She is accompanied by husband.  EYES: no pallor or icterus OROPHARYNX: no thrush or ulceration; poor dentition  LUNGS: Decreased breath sounds bilaterally at the bases. Scattered wheeze  HEART/CVS: regular rate & rhythm and no murmurs; No lower extremity edema ABDOMEN:abdomen soft, non-tender and normal bowel sounds Musculoskeletal:no cyanosis of digits and no clubbing  PSYCH: alert & oriented x 3 with fluent speech NEURO: no focal motor/sensory deficits SKIN:  no rashes or significant lesions;   LAB RESULTS:  Appointment on 06/25/2015  Component Date Value Ref Range Status  . WBC 06/25/2015 5.5  3.6 - 11.0 K/uL Final  . RBC 06/25/2015 2.76* 3.80 - 5.20 MIL/uL Final  . Hemoglobin 06/25/2015 7.9* 12.0 - 16.0 g/dL Final  . HCT 06/25/2015 24.2* 35.0 - 47.0 % Final  . MCV 06/25/2015 87.7  80.0 - 100.0 fL Final  . MCH 06/25/2015 28.5  26.0 - 34.0 pg Final  . MCHC 06/25/2015 32.5  32.0 - 36.0 g/dL Final  . RDW 06/25/2015 20.8* 11.5 - 14.5 % Final  . Platelets 06/25/2015 290  150 - 440 K/uL Final  . Neutrophils Relative % 06/25/2015 79   Final  . Neutro Abs 06/25/2015 4.4  1.4 - 6.5 K/uL Final  . Lymphocytes Relative 06/25/2015 12   Final  . Lymphs Abs 06/25/2015 0.7* 1.0 - 3.6 K/uL Final  . Monocytes Relative 06/25/2015 7   Final  . Monocytes Absolute 06/25/2015 0.4  0.2 - 0.9 K/uL Final  . Eosinophils Relative 06/25/2015 1   Final  . Eosinophils Absolute 06/25/2015 0.1  0 - 0.7 K/uL Final  . Basophils Relative 06/25/2015 1   Final  . Basophils Absolute 06/25/2015 0.0  0 - 0.1 K/uL Final  . Sodium 06/25/2015 134* 135 - 145 mmol/L Final  . Potassium 06/25/2015 3.7  3.5 - 5.1 mmol/L Final  . Chloride 06/25/2015 102  101 - 111 mmol/L Final  . CO2 06/25/2015 24  22 - 32 mmol/L Final  . Glucose, Bld 06/25/2015 262* 65 - 99 mg/dL Final  . BUN 06/25/2015 25* 6 - 20 mg/dL Final  . Creatinine, Ser 06/25/2015 1.81* 0.44 - 1.00 mg/dL Final  . Calcium 06/25/2015 8.2* 8.9 - 10.3 mg/dL Final  . GFR calc non Af Amer 06/25/2015 27* >60 mL/min Final  . GFR calc Af Amer 06/25/2015 31* >60 mL/min Final   Comment: (NOTE) The eGFR has been calculated using the CKD EPI equation. This calculation has not been validated in all clinical situations. eGFR's persistently <60 mL/min signify possible Chronic Kidney Disease.   . Anion gap 06/25/2015 8  5 - 15 Final    STUDIES: No results found.  ASSESSMENT & PLAN:   1. LUNG CA- Patient on hospice care.  Clinically patient is stable; continue monitoring for now. No obvious progression of her lung cancer on a clinical  basis.   2. Hemoglobin is 7.9; Patient feels well today. No need for blood transfusion at this time. Will follow up in one month and plan to transfuse blood if needed.  Husband will call the office if patient gets symptomatic prior to her next visit and we can check labs sooner.   3. Chronic kidney disease with Creatinine stable around 1.8-1.9. Today 1.81.     Patient expressed understanding and was in agreement with this plan. She also understands that She can call clinic at any time with any questions, concerns, or complaints.   Dr. Rogue Bussing was available for consultation and review of plan of care for this patient.   Mayra Reel, NP   06/25/2015 9:46 AM

## 2015-06-25 NOTE — Progress Notes (Signed)
Patients Hmb had dropped from 8.1 to 7.9  Patient was not symptomatic and transfusion was not to be given

## 2015-06-26 DIAGNOSIS — C3411 Malignant neoplasm of upper lobe, right bronchus or lung: Secondary | ICD-10-CM | POA: Diagnosis not present

## 2015-06-26 DIAGNOSIS — N189 Chronic kidney disease, unspecified: Secondary | ICD-10-CM | POA: Diagnosis not present

## 2015-06-26 DIAGNOSIS — D649 Anemia, unspecified: Secondary | ICD-10-CM | POA: Diagnosis not present

## 2015-06-26 DIAGNOSIS — I4891 Unspecified atrial fibrillation: Secondary | ICD-10-CM | POA: Diagnosis not present

## 2015-06-26 DIAGNOSIS — I129 Hypertensive chronic kidney disease with stage 1 through stage 4 chronic kidney disease, or unspecified chronic kidney disease: Secondary | ICD-10-CM | POA: Diagnosis not present

## 2015-06-26 DIAGNOSIS — E039 Hypothyroidism, unspecified: Secondary | ICD-10-CM | POA: Diagnosis not present

## 2015-06-28 ENCOUNTER — Other Ambulatory Visit: Payer: Self-pay | Admitting: Family Medicine

## 2015-07-02 DIAGNOSIS — E039 Hypothyroidism, unspecified: Secondary | ICD-10-CM | POA: Diagnosis not present

## 2015-07-02 DIAGNOSIS — N189 Chronic kidney disease, unspecified: Secondary | ICD-10-CM | POA: Diagnosis not present

## 2015-07-02 DIAGNOSIS — C3411 Malignant neoplasm of upper lobe, right bronchus or lung: Secondary | ICD-10-CM | POA: Diagnosis not present

## 2015-07-02 DIAGNOSIS — D649 Anemia, unspecified: Secondary | ICD-10-CM | POA: Diagnosis not present

## 2015-07-02 DIAGNOSIS — I4891 Unspecified atrial fibrillation: Secondary | ICD-10-CM | POA: Diagnosis not present

## 2015-07-02 DIAGNOSIS — I129 Hypertensive chronic kidney disease with stage 1 through stage 4 chronic kidney disease, or unspecified chronic kidney disease: Secondary | ICD-10-CM | POA: Diagnosis not present

## 2015-07-04 DIAGNOSIS — D649 Anemia, unspecified: Secondary | ICD-10-CM | POA: Diagnosis not present

## 2015-07-04 DIAGNOSIS — I4891 Unspecified atrial fibrillation: Secondary | ICD-10-CM | POA: Diagnosis not present

## 2015-07-04 DIAGNOSIS — E039 Hypothyroidism, unspecified: Secondary | ICD-10-CM | POA: Diagnosis not present

## 2015-07-04 DIAGNOSIS — I129 Hypertensive chronic kidney disease with stage 1 through stage 4 chronic kidney disease, or unspecified chronic kidney disease: Secondary | ICD-10-CM | POA: Diagnosis not present

## 2015-07-04 DIAGNOSIS — N189 Chronic kidney disease, unspecified: Secondary | ICD-10-CM | POA: Diagnosis not present

## 2015-07-04 DIAGNOSIS — C3411 Malignant neoplasm of upper lobe, right bronchus or lung: Secondary | ICD-10-CM | POA: Diagnosis not present

## 2015-07-04 DIAGNOSIS — Z87891 Personal history of nicotine dependence: Secondary | ICD-10-CM | POA: Diagnosis not present

## 2015-07-08 ENCOUNTER — Other Ambulatory Visit: Payer: Self-pay | Admitting: Family Medicine

## 2015-07-08 NOTE — Telephone Encounter (Signed)
Please contact nephrologist; see if they are comfortable taking over the prescription for patient's potassium; as her kidney function declines, I'm nervous about her getting too much and would appreciate if they would dose her diuretic and potassium going forward; thank you

## 2015-07-09 DIAGNOSIS — N189 Chronic kidney disease, unspecified: Secondary | ICD-10-CM | POA: Diagnosis not present

## 2015-07-09 DIAGNOSIS — I4891 Unspecified atrial fibrillation: Secondary | ICD-10-CM | POA: Diagnosis not present

## 2015-07-09 DIAGNOSIS — E039 Hypothyroidism, unspecified: Secondary | ICD-10-CM | POA: Diagnosis not present

## 2015-07-09 DIAGNOSIS — I129 Hypertensive chronic kidney disease with stage 1 through stage 4 chronic kidney disease, or unspecified chronic kidney disease: Secondary | ICD-10-CM | POA: Diagnosis not present

## 2015-07-09 DIAGNOSIS — D649 Anemia, unspecified: Secondary | ICD-10-CM | POA: Diagnosis not present

## 2015-07-09 DIAGNOSIS — C3411 Malignant neoplasm of upper lobe, right bronchus or lung: Secondary | ICD-10-CM | POA: Diagnosis not present

## 2015-07-10 DIAGNOSIS — I129 Hypertensive chronic kidney disease with stage 1 through stage 4 chronic kidney disease, or unspecified chronic kidney disease: Secondary | ICD-10-CM | POA: Diagnosis not present

## 2015-07-10 DIAGNOSIS — D649 Anemia, unspecified: Secondary | ICD-10-CM | POA: Diagnosis not present

## 2015-07-10 DIAGNOSIS — E039 Hypothyroidism, unspecified: Secondary | ICD-10-CM | POA: Diagnosis not present

## 2015-07-10 DIAGNOSIS — I4891 Unspecified atrial fibrillation: Secondary | ICD-10-CM | POA: Diagnosis not present

## 2015-07-10 DIAGNOSIS — C3411 Malignant neoplasm of upper lobe, right bronchus or lung: Secondary | ICD-10-CM | POA: Diagnosis not present

## 2015-07-10 DIAGNOSIS — N189 Chronic kidney disease, unspecified: Secondary | ICD-10-CM | POA: Diagnosis not present

## 2015-07-13 ENCOUNTER — Ambulatory Visit: Payer: Medicare Other | Admitting: Family Medicine

## 2015-07-15 ENCOUNTER — Other Ambulatory Visit: Payer: Self-pay | Admitting: Family Medicine

## 2015-07-15 ENCOUNTER — Other Ambulatory Visit: Payer: Self-pay | Admitting: Internal Medicine

## 2015-07-15 NOTE — Telephone Encounter (Signed)
Please ask patient or her husband to contact nephrologist for this medicine from now on; I'd be more comfortable with them monitoring her potassium and fluid status and kidney function, since her kidneys have worsened; thank you

## 2015-07-17 DIAGNOSIS — D649 Anemia, unspecified: Secondary | ICD-10-CM | POA: Diagnosis not present

## 2015-07-17 DIAGNOSIS — C3411 Malignant neoplasm of upper lobe, right bronchus or lung: Secondary | ICD-10-CM | POA: Diagnosis not present

## 2015-07-17 DIAGNOSIS — E039 Hypothyroidism, unspecified: Secondary | ICD-10-CM | POA: Diagnosis not present

## 2015-07-17 DIAGNOSIS — N189 Chronic kidney disease, unspecified: Secondary | ICD-10-CM | POA: Diagnosis not present

## 2015-07-17 DIAGNOSIS — I4891 Unspecified atrial fibrillation: Secondary | ICD-10-CM | POA: Diagnosis not present

## 2015-07-17 DIAGNOSIS — I129 Hypertensive chronic kidney disease with stage 1 through stage 4 chronic kidney disease, or unspecified chronic kidney disease: Secondary | ICD-10-CM | POA: Diagnosis not present

## 2015-07-20 ENCOUNTER — Other Ambulatory Visit: Payer: Self-pay | Admitting: *Deleted

## 2015-07-20 MED ORDER — ALBUTEROL SULFATE (2.5 MG/3ML) 0.083% IN NEBU: 2.5000 mg | INHALATION_SOLUTION | RESPIRATORY_TRACT | Status: DC | PRN

## 2015-07-20 MED ORDER — BENZONATATE 100 MG PO CAPS: 100.0000 mg | ORAL_CAPSULE | Freq: Three times a day (TID) | ORAL | Status: DC | PRN

## 2015-07-23 ENCOUNTER — Other Ambulatory Visit: Payer: Self-pay | Admitting: *Deleted

## 2015-07-23 DIAGNOSIS — R05 Cough: Secondary | ICD-10-CM

## 2015-07-23 DIAGNOSIS — I129 Hypertensive chronic kidney disease with stage 1 through stage 4 chronic kidney disease, or unspecified chronic kidney disease: Secondary | ICD-10-CM | POA: Diagnosis not present

## 2015-07-23 DIAGNOSIS — N189 Chronic kidney disease, unspecified: Secondary | ICD-10-CM | POA: Diagnosis not present

## 2015-07-23 DIAGNOSIS — I4891 Unspecified atrial fibrillation: Secondary | ICD-10-CM | POA: Diagnosis not present

## 2015-07-23 DIAGNOSIS — C3411 Malignant neoplasm of upper lobe, right bronchus or lung: Secondary | ICD-10-CM

## 2015-07-23 DIAGNOSIS — R059 Cough, unspecified: Secondary | ICD-10-CM

## 2015-07-23 DIAGNOSIS — D649 Anemia, unspecified: Secondary | ICD-10-CM | POA: Diagnosis not present

## 2015-07-23 DIAGNOSIS — E039 Hypothyroidism, unspecified: Secondary | ICD-10-CM | POA: Diagnosis not present

## 2015-07-23 MED ORDER — GUAIFENESIN-CODEINE 100-10 MG/5ML PO SYRP: 5.0000 mL | ORAL_SOLUTION | Freq: Three times a day (TID) | ORAL | Status: DC | PRN

## 2015-07-24 ENCOUNTER — Ambulatory Visit: Payer: Medicare Other | Admitting: Internal Medicine

## 2015-07-24 ENCOUNTER — Other Ambulatory Visit: Payer: Self-pay | Admitting: *Deleted

## 2015-07-24 ENCOUNTER — Ambulatory Visit: Payer: Medicare Other

## 2015-07-24 ENCOUNTER — Other Ambulatory Visit: Payer: Medicare Other

## 2015-07-24 DIAGNOSIS — C3490 Malignant neoplasm of unspecified part of unspecified bronchus or lung: Secondary | ICD-10-CM

## 2015-07-27 ENCOUNTER — Inpatient Hospital Stay

## 2015-07-27 ENCOUNTER — Inpatient Hospital Stay: Attending: Internal Medicine | Admitting: Internal Medicine

## 2015-07-27 DIAGNOSIS — I4891 Unspecified atrial fibrillation: Secondary | ICD-10-CM | POA: Insufficient documentation

## 2015-07-27 DIAGNOSIS — D631 Anemia in chronic kidney disease: Secondary | ICD-10-CM | POA: Insufficient documentation

## 2015-07-27 DIAGNOSIS — C3411 Malignant neoplasm of upper lobe, right bronchus or lung: Secondary | ICD-10-CM | POA: Insufficient documentation

## 2015-07-27 DIAGNOSIS — N184 Chronic kidney disease, stage 4 (severe): Secondary | ICD-10-CM | POA: Insufficient documentation

## 2015-07-27 DIAGNOSIS — Z87891 Personal history of nicotine dependence: Secondary | ICD-10-CM | POA: Insufficient documentation

## 2015-07-27 DIAGNOSIS — Z79899 Other long term (current) drug therapy: Secondary | ICD-10-CM | POA: Diagnosis not present

## 2015-07-27 DIAGNOSIS — E039 Hypothyroidism, unspecified: Secondary | ICD-10-CM | POA: Diagnosis not present

## 2015-07-27 DIAGNOSIS — B351 Tinea unguium: Secondary | ICD-10-CM | POA: Diagnosis not present

## 2015-07-27 DIAGNOSIS — C3490 Malignant neoplasm of unspecified part of unspecified bronchus or lung: Secondary | ICD-10-CM

## 2015-07-27 DIAGNOSIS — I129 Hypertensive chronic kidney disease with stage 1 through stage 4 chronic kidney disease, or unspecified chronic kidney disease: Secondary | ICD-10-CM | POA: Insufficient documentation

## 2015-07-27 LAB — COMPREHENSIVE METABOLIC PANEL
ALK PHOS: 96 U/L (ref 38–126)
ALT: 9 U/L — AB (ref 14–54)
AST: 16 U/L (ref 15–41)
Albumin: 2.5 g/dL — ABNORMAL LOW (ref 3.5–5.0)
Anion gap: 5 (ref 5–15)
BILIRUBIN TOTAL: 0.5 mg/dL (ref 0.3–1.2)
BUN: 13 mg/dL (ref 6–20)
CHLORIDE: 109 mmol/L (ref 101–111)
CO2: 23 mmol/L (ref 22–32)
CREATININE: 1.43 mg/dL — AB (ref 0.44–1.00)
Calcium: 8.3 mg/dL — ABNORMAL LOW (ref 8.9–10.3)
GFR calc Af Amer: 41 mL/min — ABNORMAL LOW (ref >60)
GFR, EST NON AFRICAN AMERICAN: 35 mL/min — AB (ref >60)
Glucose, Bld: 143 mg/dL — ABNORMAL HIGH (ref 65–99)
Potassium: 3.9 mmol/L (ref 3.5–5.1)
Sodium: 137 mmol/L (ref 135–145)
TOTAL PROTEIN: 7.3 g/dL (ref 6.5–8.1)

## 2015-07-27 LAB — SAMPLE TO BLOOD BANK

## 2015-07-27 LAB — CBC WITH DIFFERENTIAL/PLATELET
BASOS ABS: 0 10*3/uL (ref 0–0.1)
Basophils Relative: 1 %
Eosinophils Absolute: 0.1 10*3/uL (ref 0–0.7)
Eosinophils Relative: 2 %
HEMATOCRIT: 26.7 % — AB (ref 35.0–47.0)
HEMOGLOBIN: 8.5 g/dL — AB (ref 12.0–16.0)
LYMPHS PCT: 15 %
Lymphs Abs: 0.5 10*3/uL — ABNORMAL LOW (ref 1.0–3.6)
MCH: 27.7 pg (ref 26.0–34.0)
MCHC: 32 g/dL (ref 32.0–36.0)
MCV: 86.5 fL (ref 80.0–100.0)
Monocytes Absolute: 0.3 10*3/uL (ref 0.2–0.9)
Monocytes Relative: 8 %
NEUTROS ABS: 2.6 10*3/uL (ref 1.4–6.5)
Neutrophils Relative %: 74 %
Platelets: 258 10*3/uL (ref 150–440)
RBC: 3.09 MIL/uL — AB (ref 3.80–5.20)
RDW: 19.6 % — ABNORMAL HIGH (ref 11.5–14.5)
WBC: 3.4 10*3/uL — AB (ref 3.6–11.0)

## 2015-07-27 NOTE — Progress Notes (Signed)
[place] OFFICE PROGRESS NOTE  Patient Care Team: Arnetha Courser, MD as PCP - General (Family Medicine) Manya Silvas, MD (Gastroenterology) Cammie Sickle, MD as Consulting Physician (Oncology) Lavonia Dana, MD as Consulting Physician (Nephrology)   SUMMARY OF ONCOLOGIC HISTORY:  Oncology History   1. 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 CT-guided biopsy on 02/10/11 - insufficient for definite diagnosis. Rare group of atypical cells. Got chemotherapy with Paclitaxel/Carboplatin (started on 02/14/11, completed cycle 6 day 8 on 08/22/11). Also got concurrent radiation. 01/18/12 - Underwent Preoperative bronchoscopy with bronchoalveolar lavage right upper lobe. Right thoracoscopy with pleural biopsy and talc pleurodesis. Negative for malignancy. PET scan on 03/29/12 IMPRESSION: Multifocal pleural activity in the right chest suggesting pleural tumor spread. Activity is also noted in the right hilar region suggesting persistent tumor in this region. April 04, 2012 - VERISTRAT test reported as 'GOOD'. Started Tarceva on 05/17/12. July 2014 - declining performance status, unable to tolerate low dose Tarceva, was on home hospice which was discontinued since patient doing steady.     Lung cancer [facility])    Initial Diagnosis    Lung cancer [facility])       INTERVAL HISTORY:  [name] with above history of recurrent/metastatic non-small cell lung cancer is here status post cycle #4 OPDIVO stopped in March 2017 because of decline in performance status/rising kidney function/intolerance.  She goes around with a walker. Chronic SOB/ No new worsening shortness of breath or cough. Patient continues to feel weak- however this is not getting any worse. Complains of toenails coming coming off; no  pain.   As per family she is continuing spend most of the time resting /wheelchair for ambulation.   REVIEW OF SYSTEMS:  A complete 10 point review of system is done which is negative except mentioned above/history of present illness.   PAST MEDICAL HISTORY :  Past Medical History:  Diagnosis Date  . Anemia   . Anemia   . Atrial fibrillation ([place])    Dr. Humphrey Rolls, cardiologist  . CKD (chronic kidney disease) stage 4, GFR 15-29 ml/min ([place]) 05/01/2015  . Hypertension   . Hypothyroidism   . Lung cancer ([place])   . Lung cancer ([place])     PAST SURGICAL HISTORY :   Past Surgical History:  Procedure Laterality Date  . COLONOSCOPY N/A 10/11/2014   Procedure: COLONOSCOPY;  Surgeon: Manya Silvas, MD;  Location: [facility] ENDOSCOPY;  Service: Endoscopy;  Laterality: N/A;  . ESOPHAGOGASTRODUODENOSCOPY N/A 10/10/2014   Procedure: ESOPHAGOGASTRODUODENOSCOPY (EGD);  Surgeon: Manya Silvas, MD;  Location: [facility] ENDOSCOPY;  Service: Endoscopy;  Laterality: N/A;  . UPPER GASTROINTESTINAL ENDOSCOPY  02/24/14    FAMILY HISTORY :   Family History  Problem Relation Age of Onset  . Stomach cancer Mother   . Cancer Mother     unsure where but states "it spead all over"  . Alzheimer's disease Father   . Varicose Veins Daughter   . Diabetes Neg Hx   . Heart disease Neg Hx   . Hypertension Neg Hx   . Stroke Neg Hx   . COPD Neg Hx     SOCIAL HISTORY:   Social History  Substance Use Topics  . Smoking status: Former Smoker    Packs/day: 0.75    Years: 30.00    Types: Cigarettes    Quit date: 01/03/2005  .  Smokeless tobacco: Never Used  . Alcohol use No    ALLERGIES:  has No Known Allergies.  MEDICATIONS:  Current Outpatient Prescriptions  Medication Sig Dispense Refill  . albuterol (PROVENTIL) (2.5 MG/3ML) 0.083% nebulizer solution Inhale 3 mLs (2.5 mg total) into the lungs every 4 (four) hours as needed for wheezing or shortness of breath. 360 mL 3  . atorvastatin (LIPITOR) 20 MG tablet Take  20 mg by mouth at bedtime.     . benzonatate (TESSALON) 100 MG capsule Take 1 capsule (100 mg total) by mouth 3 (three) times daily as needed for cough. 90 capsule 1  . carvedilol (COREG) 3.125 MG tablet Take 6.25 mg by mouth 2 (two) times daily with a meal.     . chlorpheniramine (ALLERGY) 4 MG tablet Take 4 mg by mouth every 4 (four) hours as needed. Reported on 01/02/2015    . ferrous gluconate (FERATE) 240 (27 FE) MG tablet Take 240 mg by mouth 3 (three) times daily with meals.    . gabapentin (NEURONTIN) 100 MG capsule Take 1 capsule by mouth two times daily 180 capsule 1  . guaiFENesin-codeine (ROBITUSSIN AC) 100-10 MG/5ML syrup Take 5 mLs by mouth 3 (three) times daily as needed for cough. 120 mL 0  . isosorbide mononitrate (IMDUR) 30 MG 24 hr tablet Take 30 mg by mouth daily.    Marland Kitchen levothyroxine (SYNTHROID, LEVOTHROID) 50 MCG tablet Take 1 tablet by mouth  daily for 5 days each week  and take one-half tablet by mouth daily for 2 days each week 78 tablet 0  . megestrol (MEGACE) 400 MG/10ML suspension Take 5 mg by mouth daily.    . Multiple Vitamin (MULTIVITAMIN WITH MINERALS) TABS tablet Take 1 tablet by mouth daily.    . pantoprazole (PROTONIX) 40 MG tablet Take 40 mg by mouth daily.    . potassium chloride SA (K-DUR,KLOR-CON) 20 MEQ tablet Take 20 mEq by mouth.    . triamcinolone cream (KENALOG) 0.5 % Apply topically two times  daily as needed 30 g 1  . torsemide (DEMADEX) 10 MG tablet Take 1 tablet (10 mg total) by mouth daily. (Patient not taking: Reported on 07/27/2015) 90 tablet 0   No current facility-administered medications for this visit.    Facility-Administered Medications Ordered in Other Visits  Medication Dose Route Frequency Provider Last Rate Last Dose  . 0.9 %  sodium chloride infusion   Intravenous Continuous Leia Alf, MD 20 mL/hr at 07/04/14 1150    . heparin lock flush 100 unit/mL  500 Units Intravenous Once Leia Alf, MD      . heparin lock flush 100  unit/mL  500 Units Intravenous Once Leia Alf, MD      . sodium chloride 0.9 % injection 10 mL  10 mL Intravenous PRN Leia Alf, MD      . sodium chloride 0.9 % injection 10 mL  10 mL Intravenous PRN Leia Alf, MD   10 mL at 07/04/14 1145    PHYSICAL EXAMINATION: ECOG PERFORMANCE STATUS: 3 - Symptomatic, >50% confined to bed  BP 97/62 (BP Location: Left Arm, Patient Position: Sitting)   Pulse 97   Temp 98.6 F (37 C) (Tympanic)   Wt 156 lb 3 oz (70.8 kg)   BMI 33.80 kg/m   Filed Weights   07/27/15 0928 07/27/15 0933  Weight: 153 lb 3 oz (69.5 kg) 156 lb 3 oz (70.8 kg)    GENERAL:  Alert, no distress and comfortable. Thin built/moderately nourished  In  a wheel chair;  She is accompanied by husband.  EYES: no pallor or icterus OROPHARYNX: no thrush or ulceration; poor dentition  NECK: supple, no masses felt LYMPH:  no palpable lymphadenopathy in the cervical, axillary or inguinal regions LUNGS: Decreased breath sounds bilaterally at the bases. Scattered wheeze noted or crackles HEART/CVS: regular rate & rhythm and no murmurs; No lower extremity edema ABDOMEN:abdomen soft, non-tender and normal bowel sounds Musculoskeletal:no cyanosis of digits and no clubbing  PSYCH: alert & oriented x 3 with fluent speech NEURO: no focal motor/sensory deficits SKIN:  no rashes or significant lesions; Left big toe- toenail coming off no signs of infection.  LABORATORY DATA:  I have reviewed the data as listed    Component Value Date/Time   NA 137 07/27/2015 0900   NA 136 08/19/2014 1337   NA 135 (L) 02/08/2014 0844   K 3.9 07/27/2015 0900   K 4.0 02/08/2014 0844   CL 109 07/27/2015 0900   CL 105 02/08/2014 0844   CO2 23 07/27/2015 0900   CO2 21 02/08/2014 0844   GLUCOSE 143 (H) 07/27/2015 0900   GLUCOSE 157 (H) 02/08/2014 0844   BUN 13 07/27/2015 0900   BUN 32 (H) 08/19/2014 1337   BUN 15 02/08/2014 0844   CREATININE 1.43 (H) 07/27/2015 0900   CREATININE 1.36 (H)  02/08/2014 0844   CALCIUM 8.3 (L) 07/27/2015 0900   CALCIUM 8.8 02/08/2014 0844   PROT 7.3 07/27/2015 0900   PROT 7.9 12/06/2013 1453   ALBUMIN 2.5 (L) 07/27/2015 0900   ALBUMIN 2.8 (L) 12/06/2013 1453   AST 16 07/27/2015 0900   AST 16 12/06/2013 1453   ALT 9 (L) 07/27/2015 0900   ALT 12 (L) 12/06/2013 1453   ALKPHOS 96 07/27/2015 0900   ALKPHOS 82 12/06/2013 1453   BILITOT 0.5 07/27/2015 0900   BILITOT 0.3 12/06/2013 1453   GFRNONAA 35 (L) 07/27/2015 0900   GFRNONAA 41 (L) 02/08/2014 0844   GFRNONAA 32 (L) 07/30/2013 1508   GFRAA 41 (L) 07/27/2015 0900   GFRAA 49 (L) 02/08/2014 0844   GFRAA 37 (L) 07/30/2013 1508    No results found for: SPEP, UPEP  Lab Results  Component Value Date   WBC 3.4 (L) 07/27/2015   NEUTROABS 2.6 07/27/2015   HGB 8.5 (L) 07/27/2015   HCT 26.7 (L) 07/27/2015   MCV 86.5 07/27/2015   PLT 258 07/27/2015      Chemistry      Component Value Date/Time   NA 137 07/27/2015 0900   NA 136 08/19/2014 1337   NA 135 (L) 02/08/2014 0844   K 3.9 07/27/2015 0900   K 4.0 02/08/2014 0844   CL 109 07/27/2015 0900   CL 105 02/08/2014 0844   CO2 23 07/27/2015 0900   CO2 21 02/08/2014 0844   BUN 13 07/27/2015 0900   BUN 32 (H) 08/19/2014 1337   BUN 15 02/08/2014 0844   CREATININE 1.43 (H) 07/27/2015 0900   CREATININE 1.36 (H) 02/08/2014 0844      Component Value Date/Time   CALCIUM 8.3 (L) 07/27/2015 0900   CALCIUM 8.8 02/08/2014 0844   ALKPHOS 96 07/27/2015 0900   ALKPHOS 82 12/06/2013 1453   AST 16 07/27/2015 0900   AST 16 12/06/2013 1453   ALT 9 (L) 07/27/2015 0900   ALT 12 (L) 12/06/2013 1453   BILITOT 0.5 07/27/2015 0900   BILITOT 0.3 12/06/2013 1453        ASSESSMENT & PLAN:   Cancer of upper lobe  of right lung ([place]) # LUNG CA- non-small cell lung stage IV; s/p Opdivo every 2 weeks x#4; PET scan MARCH 2017-right lower lobe lesion improving in size [currently 2.2 x 2.5 from 3.5 x3cm]; and the right lung continues to show extensive  radiation changes.  Patient's treatments are currently on hold because of declining performance status;  worsening renal function.   # Clinically patient is stable; continue monitoring for now. No obvious progression of her lung cancer on a clinical basis. Continue hospice care.  # Hemoglobin is 8.5. No transfusion planned. Continue PO iron.   # Onychomycosis- recommend referral to podiatrist.   # chronic kidney disease with Creatinine stable around 1.8-1.9. Stable.   # Patient follow-up with me in 6 weeks/labs/no treatment.   # 15 minutes face-to-face with the patient discussing the above plan of care; more than 50% of time spent on natural history; counseling and coordination.     Cammie Sickle, MD 07/27/2015 9:51 AM

## 2015-07-28 ENCOUNTER — Other Ambulatory Visit: Payer: Self-pay

## 2015-07-28 ENCOUNTER — Ambulatory Visit: Payer: Medicare Other | Admitting: Family Medicine

## 2015-07-28 DIAGNOSIS — C3411 Malignant neoplasm of upper lobe, right bronchus or lung: Secondary | ICD-10-CM | POA: Diagnosis not present

## 2015-07-28 DIAGNOSIS — N189 Chronic kidney disease, unspecified: Secondary | ICD-10-CM | POA: Diagnosis not present

## 2015-07-28 DIAGNOSIS — I4891 Unspecified atrial fibrillation: Secondary | ICD-10-CM | POA: Diagnosis not present

## 2015-07-28 DIAGNOSIS — I129 Hypertensive chronic kidney disease with stage 1 through stage 4 chronic kidney disease, or unspecified chronic kidney disease: Secondary | ICD-10-CM | POA: Diagnosis not present

## 2015-07-28 DIAGNOSIS — E039 Hypothyroidism, unspecified: Secondary | ICD-10-CM | POA: Diagnosis not present

## 2015-07-28 DIAGNOSIS — D649 Anemia, unspecified: Secondary | ICD-10-CM | POA: Diagnosis not present

## 2015-07-28 NOTE — Assessment & Plan Note (Addendum)
#   LUNG CA- non-small cell lung stage IV; s/p Opdivo every 2 weeks x#4; PET scan MARCH 2017-right lower lobe lesion improving in size [currently 2.2 x 2.5 from 3.5 x3cm]; and the right lung continues to show extensive radiation changes.  Patient's treatments are currently on hold because of declining performance status;  worsening renal function.   # Clinically patient is stable; continue monitoring for now. No obvious progression of her lung cancer on a clinical basis. Continue hospice care.  # Hemoglobin is 8.5. No transfusion planned. Continue PO iron.   # Onychomycosis- recommend referral to podiatrist.   # chronic kidney disease with Creatinine stable around 1.8-1.9. Stable.   # Patient follow-up with me in 6 weeks/labs/no treatment.   # 15 minutes face-to-face with the patient discussing the above plan of care; more than 50% of time spent on natural history; counseling and coordination.

## 2015-07-30 ENCOUNTER — Other Ambulatory Visit: Payer: Self-pay | Admitting: Internal Medicine

## 2015-07-30 ENCOUNTER — Encounter: Payer: Self-pay | Admitting: Family Medicine

## 2015-07-30 ENCOUNTER — Ambulatory Visit (INDEPENDENT_AMBULATORY_CARE_PROVIDER_SITE_OTHER): Payer: Medicare Other | Admitting: Family Medicine

## 2015-07-30 VITALS — BP 114/52 | HR 92 | Temp 98.4°F | Resp 14 | Wt 154.3 lb

## 2015-07-30 DIAGNOSIS — C3411 Malignant neoplasm of upper lobe, right bronchus or lung: Secondary | ICD-10-CM

## 2015-07-30 DIAGNOSIS — R05 Cough: Secondary | ICD-10-CM

## 2015-07-30 DIAGNOSIS — N183 Chronic kidney disease, stage 3 unspecified: Secondary | ICD-10-CM

## 2015-07-30 DIAGNOSIS — I480 Paroxysmal atrial fibrillation: Secondary | ICD-10-CM | POA: Diagnosis not present

## 2015-07-30 DIAGNOSIS — D509 Iron deficiency anemia, unspecified: Secondary | ICD-10-CM

## 2015-07-30 DIAGNOSIS — E7800 Pure hypercholesterolemia, unspecified: Secondary | ICD-10-CM

## 2015-07-30 DIAGNOSIS — N189 Chronic kidney disease, unspecified: Secondary | ICD-10-CM | POA: Diagnosis not present

## 2015-07-30 DIAGNOSIS — I4891 Unspecified atrial fibrillation: Secondary | ICD-10-CM | POA: Diagnosis not present

## 2015-07-30 DIAGNOSIS — D649 Anemia, unspecified: Secondary | ICD-10-CM | POA: Diagnosis not present

## 2015-07-30 DIAGNOSIS — I129 Hypertensive chronic kidney disease with stage 1 through stage 4 chronic kidney disease, or unspecified chronic kidney disease: Secondary | ICD-10-CM | POA: Diagnosis not present

## 2015-07-30 DIAGNOSIS — E038 Other specified hypothyroidism: Secondary | ICD-10-CM | POA: Diagnosis not present

## 2015-07-30 DIAGNOSIS — E039 Hypothyroidism, unspecified: Secondary | ICD-10-CM | POA: Diagnosis not present

## 2015-07-30 DIAGNOSIS — R059 Cough, unspecified: Secondary | ICD-10-CM

## 2015-07-30 MED ORDER — MONTELUKAST SODIUM 5 MG PO CHEW: 5.0000 mg | CHEWABLE_TABLET | Freq: Every day | ORAL | 1 refills | Status: DC

## 2015-07-30 NOTE — Assessment & Plan Note (Signed)
Limit saturated fats; on statin; check lipids with next blood draw

## 2015-07-30 NOTE — Assessment & Plan Note (Signed)
Check TSH with next set of labs

## 2015-07-30 NOTE — Progress Notes (Signed)
BP (!) 114/52   Pulse 92   Temp 98.4 F (36.9 C) (Oral)   Resp 14   Wt 154 lb 4.8 oz (70 kg)   SpO2 98%   BMI 33.39 kg/m    Subjective:    Patient ID: Katrina Kramer, female    DOB: 1940/02/24, 75 y.o.   MRN: [id number]  HPI: Katrina Kramer is a 75 y.o. female  Chief Complaint  Patient presents with  . Follow-up   Patient is here with her husband for f/u; she has lung cancer and is under the care of Hospice She has a lot of phlegm and it gets hung up; just hangs and has to cough it up She is on cough medicine with codeine OTC and tessalon perles Uses the albuterol neb, using 3-4 x a day; does open her up Feels like it is in the neck; not draining from nose I asked about dairy and she does eat boost every day Worse in the evening; worse with mowing yards Megace helps with appetite Sometimes short of breath, oxygen today is great; moving around in the house, gets short of breath We reviewed the labs, kidneys are the best they've been in a while; goes to see kidney doctor August 8th Doing pretty well with swelling in legs Hospice nurse comes out once a week Not in any pain Thyroid labs in April were normal  Depression screen [facility] 2/9 07/30/2015 04/13/2015  Decreased Interest 0 0  Down, Depressed, Hopeless 0 0  PHQ - 2 Score 0 0   Relevant past medical, surgical, family and social history reviewed Past Medical History:  Diagnosis Date  . Anemia   . Anemia   . Atrial fibrillation ([place])    Dr. Humphrey Rolls, cardiologist  . CKD (chronic kidney disease) stage 4, GFR 15-29 ml/min ([place]) 05/01/2015  . Hypertension   . Hypothyroidism   . Lung cancer ([place])   . Lung cancer [facility])    Past Surgical History:  Procedure Laterality Date  . COLONOSCOPY N/A 10/11/2014   Procedure: COLONOSCOPY;  Surgeon: Manya Silvas, MD;  Location: [facility] ENDOSCOPY;  Service: Endoscopy;  Laterality: N/A;  . ESOPHAGOGASTRODUODENOSCOPY N/A 10/10/2014   Procedure: ESOPHAGOGASTRODUODENOSCOPY (EGD);  Surgeon:  Manya Silvas, MD;  Location: [facility] ENDOSCOPY;  Service: Endoscopy;  Laterality: N/A;  . UPPER GASTROINTESTINAL ENDOSCOPY  02/24/14   Family History  Problem Relation Age of Onset  . Stomach cancer Mother   . Cancer Mother     unsure where but states "it spead all over"  . Alzheimer's disease Father   . Varicose Veins Daughter   . Diabetes Neg Hx   . Heart disease Neg Hx   . Hypertension Neg Hx   . Stroke Neg Hx   . COPD Neg Hx    Social History  Substance Use Topics  . Smoking status: Former Smoker    Packs/day: 0.75    Years: 30.00    Types: Cigarettes    Quit date: 01/03/2005  . Smokeless tobacco: Never Used  . Alcohol use No   Interim medical history since last visit reviewed. Allergies and medications reviewed  Review of Systems Per HPI unless specifically indicated above     Objective:    BP (!) 114/52   Pulse 92   Temp 98.4 F (36.9 C) (Oral)   Resp 14   Wt 154 lb 4.8 oz (70 kg)   SpO2 98%   BMI 33.39 kg/m   Wt Readings from Last 3 Encounters:  07/30/15 154 lb 4.8 oz (70 kg)  07/27/15 156 lb 3 oz (70.8 kg)  06/25/15 145 lb 6 oz (65.9 kg)    Physical Exam  Constitutional: No distress.  Elderly female seated in wheelchair, gait not assessed; appears frail and chronically ill  HENT:  Head: Normocephalic and atraumatic.  Eyes: EOM are normal. No scleral icterus.  Neck: No JVD present. No thyromegaly present.  Cardiovascular: Normal rate.   Pulmonary/Chest: Effort normal. She has decreased breath sounds.  Abdominal: She exhibits no distension.  Neurological: She is alert.  Skin: Skin is warm. She is not diaphoretic. No pallor.  Psychiatric: She has a normal mood and affect. Her mood appears not anxious. She does not exhibit a depressed mood.  Good eye contact with examiner   Results for orders placed or performed in visit on 07/27/15  CBC with Differential  Result Value Ref Range   WBC 3.4 (L) 3.6 - 11.0 K/uL   RBC 3.09 (L) 3.80 - 5.20 MIL/uL    Hemoglobin 8.5 (L) 12.0 - 16.0 g/dL   HCT 26.7 (L) 35.0 - 47.0 %   MCV 86.5 80.0 - 100.0 fL   MCH 27.7 26.0 - 34.0 pg   MCHC 32.0 32.0 - 36.0 g/dL   RDW 19.6 (H) 11.5 - 14.5 %   Platelets 258 150 - 440 K/uL   Neutrophils Relative % 74 %   Neutro Abs 2.6 1.4 - 6.5 K/uL   Lymphocytes Relative 15 %   Lymphs Abs 0.5 (L) 1.0 - 3.6 K/uL   Monocytes Relative 8 %   Monocytes Absolute 0.3 0.2 - 0.9 K/uL   Eosinophils Relative 2 %   Eosinophils Absolute 0.1 0 - 0.7 K/uL   Basophils Relative 1 %   Basophils Absolute 0.0 0 - 0.1 K/uL  Comprehensive metabolic panel  Result Value Ref Range   Sodium 137 135 - 145 mmol/L   Potassium 3.9 3.5 - 5.1 mmol/L   Chloride 109 101 - 111 mmol/L   CO2 23 22 - 32 mmol/L   Glucose, Bld 143 (H) 65 - 99 mg/dL   BUN 13 6 - 20 mg/dL   Creatinine, Ser 1.43 (H) 0.44 - 1.00 mg/dL   Calcium 8.3 (L) 8.9 - 10.3 mg/dL   Total Protein 7.3 6.5 - 8.1 g/dL   Albumin 2.5 (L) 3.5 - 5.0 g/dL   AST 16 15 - 41 U/L   ALT 9 (L) 14 - 54 U/L   Alkaline Phosphatase 96 38 - 126 U/L   Total Bilirubin 0.5 0.3 - 1.2 mg/dL   GFR calc non Af Amer 35 (L) >60 mL/min   GFR calc Af Amer 41 (L) >60 mL/min   Anion gap 5 5 - 15  Hold Tube- Blood Bank  Result Value Ref Range   Blood Bank Specimen SAMPLE AVAILABLE FOR TESTING    Sample Expiration 07/30/2015       Assessment & Plan:   Problem List Items Addressed This Visit      Respiratory   Cancer of upper lobe of right lung ([place])    Glad she is receiving Hospice care; her spirits are good; Hospice nurse coming in regularly; f/u with heme-onc        Endocrine   Hypothyroidism - Primary    Check TSH with next set of labs      Relevant Orders   TSH     Genitourinary   CKD (chronic kidney disease) stage 3, GFR 30-59 ml/min    Kidney  function is the best it has been in months; she will be seeing nephrologist soon; I will respectfully defer her diuretics and potassium to that office        Other   IDA (iron deficiency  anemia)    Managed by heme-onc      Hypercholesterolemia    Limit saturated fats; on statin; check lipids with next blood draw      Relevant Orders   Lipid Panel w/o Chol/HDL Ratio    Other Visit Diagnoses    Cough       likely multifactorial; will have her try singulair to see if this helps; she is already on other antitussives and gabapentin       Follow up plan: Return 4-5 months, for next follow-up.  An after-visit summary was printed and given to the patient at [place].  Please see the patient instructions which may contain other information and recommendations beyond what is mentioned above in the assessment and plan.  Meds ordered this encounter  Medications  . montelukast (SINGULAIR) 5 MG chewable tablet    Sig: Chew 1 tablet (5 mg total) by mouth at bedtime.    Dispense:  30 tablet    Refill:  1    Orders Placed This Encounter  Procedures  . TSH  . Lipid Panel w/o Chol/HDL Ratio

## 2015-07-30 NOTE — Patient Instructions (Addendum)
Please ask your kidney doctor to manage and adjust your torsemide and potassium We'll try the singulair (montelukast) for possible allergies and see if that helps your cough Okay to keep taking the other pill that you are taking We'll check thyroid and cholesterol when you have your labs done at the cancer center next

## 2015-08-01 ENCOUNTER — Other Ambulatory Visit: Payer: Self-pay | Admitting: Internal Medicine

## 2015-08-01 DIAGNOSIS — C3411 Malignant neoplasm of upper lobe, right bronchus or lung: Secondary | ICD-10-CM

## 2015-08-01 DIAGNOSIS — D509 Iron deficiency anemia, unspecified: Secondary | ICD-10-CM

## 2015-08-02 NOTE — Assessment & Plan Note (Signed)
Managed by heme-onc 

## 2015-08-02 NOTE — Assessment & Plan Note (Signed)
Glad she is receiving Hospice care; her spirits are good; Hospice nurse coming in regularly; f/u with heme-onc

## 2015-08-02 NOTE — Assessment & Plan Note (Signed)
Kidney function is the best it has been in months; she will be seeing nephrologist soon; I will respectfully defer her diuretics and potassium to that office

## 2015-08-02 NOTE — Assessment & Plan Note (Signed)
Managed by cardiologist 

## 2015-08-04 DIAGNOSIS — Z87891 Personal history of nicotine dependence: Secondary | ICD-10-CM | POA: Diagnosis not present

## 2015-08-04 DIAGNOSIS — I4891 Unspecified atrial fibrillation: Secondary | ICD-10-CM | POA: Diagnosis not present

## 2015-08-04 DIAGNOSIS — I509 Heart failure, unspecified: Secondary | ICD-10-CM | POA: Diagnosis not present

## 2015-08-04 DIAGNOSIS — E039 Hypothyroidism, unspecified: Secondary | ICD-10-CM | POA: Diagnosis not present

## 2015-08-04 DIAGNOSIS — D649 Anemia, unspecified: Secondary | ICD-10-CM | POA: Diagnosis not present

## 2015-08-04 DIAGNOSIS — I420 Dilated cardiomyopathy: Secondary | ICD-10-CM | POA: Diagnosis not present

## 2015-08-04 DIAGNOSIS — I129 Hypertensive chronic kidney disease with stage 1 through stage 4 chronic kidney disease, or unspecified chronic kidney disease: Secondary | ICD-10-CM | POA: Diagnosis not present

## 2015-08-04 DIAGNOSIS — I10 Essential (primary) hypertension: Secondary | ICD-10-CM | POA: Diagnosis not present

## 2015-08-04 DIAGNOSIS — N189 Chronic kidney disease, unspecified: Secondary | ICD-10-CM | POA: Diagnosis not present

## 2015-08-04 DIAGNOSIS — C3411 Malignant neoplasm of upper lobe, right bronchus or lung: Secondary | ICD-10-CM | POA: Diagnosis not present

## 2015-08-04 DIAGNOSIS — R0602 Shortness of breath: Secondary | ICD-10-CM | POA: Diagnosis not present

## 2015-08-05 DIAGNOSIS — I4891 Unspecified atrial fibrillation: Secondary | ICD-10-CM | POA: Diagnosis not present

## 2015-08-05 DIAGNOSIS — D649 Anemia, unspecified: Secondary | ICD-10-CM | POA: Diagnosis not present

## 2015-08-05 DIAGNOSIS — C3411 Malignant neoplasm of upper lobe, right bronchus or lung: Secondary | ICD-10-CM | POA: Diagnosis not present

## 2015-08-05 DIAGNOSIS — N189 Chronic kidney disease, unspecified: Secondary | ICD-10-CM | POA: Diagnosis not present

## 2015-08-05 DIAGNOSIS — I129 Hypertensive chronic kidney disease with stage 1 through stage 4 chronic kidney disease, or unspecified chronic kidney disease: Secondary | ICD-10-CM | POA: Diagnosis not present

## 2015-08-05 DIAGNOSIS — E039 Hypothyroidism, unspecified: Secondary | ICD-10-CM | POA: Diagnosis not present

## 2015-08-11 DIAGNOSIS — N184 Chronic kidney disease, stage 4 (severe): Secondary | ICD-10-CM | POA: Diagnosis not present

## 2015-08-11 DIAGNOSIS — I129 Hypertensive chronic kidney disease with stage 1 through stage 4 chronic kidney disease, or unspecified chronic kidney disease: Secondary | ICD-10-CM | POA: Diagnosis not present

## 2015-08-11 DIAGNOSIS — N2581 Secondary hyperparathyroidism of renal origin: Secondary | ICD-10-CM | POA: Diagnosis not present

## 2015-08-11 DIAGNOSIS — D631 Anemia in chronic kidney disease: Secondary | ICD-10-CM | POA: Diagnosis not present

## 2015-08-12 DIAGNOSIS — I4891 Unspecified atrial fibrillation: Secondary | ICD-10-CM | POA: Diagnosis not present

## 2015-08-12 DIAGNOSIS — E039 Hypothyroidism, unspecified: Secondary | ICD-10-CM | POA: Diagnosis not present

## 2015-08-12 DIAGNOSIS — I129 Hypertensive chronic kidney disease with stage 1 through stage 4 chronic kidney disease, or unspecified chronic kidney disease: Secondary | ICD-10-CM | POA: Diagnosis not present

## 2015-08-12 DIAGNOSIS — C3411 Malignant neoplasm of upper lobe, right bronchus or lung: Secondary | ICD-10-CM | POA: Diagnosis not present

## 2015-08-12 DIAGNOSIS — N189 Chronic kidney disease, unspecified: Secondary | ICD-10-CM | POA: Diagnosis not present

## 2015-08-12 DIAGNOSIS — D649 Anemia, unspecified: Secondary | ICD-10-CM | POA: Diagnosis not present

## 2015-08-14 DIAGNOSIS — N189 Chronic kidney disease, unspecified: Secondary | ICD-10-CM | POA: Diagnosis not present

## 2015-08-14 DIAGNOSIS — C3411 Malignant neoplasm of upper lobe, right bronchus or lung: Secondary | ICD-10-CM | POA: Diagnosis not present

## 2015-08-14 DIAGNOSIS — E039 Hypothyroidism, unspecified: Secondary | ICD-10-CM | POA: Diagnosis not present

## 2015-08-14 DIAGNOSIS — I129 Hypertensive chronic kidney disease with stage 1 through stage 4 chronic kidney disease, or unspecified chronic kidney disease: Secondary | ICD-10-CM | POA: Diagnosis not present

## 2015-08-14 DIAGNOSIS — D649 Anemia, unspecified: Secondary | ICD-10-CM | POA: Diagnosis not present

## 2015-08-14 DIAGNOSIS — I4891 Unspecified atrial fibrillation: Secondary | ICD-10-CM | POA: Diagnosis not present

## 2015-08-15 ENCOUNTER — Other Ambulatory Visit: Payer: Self-pay | Admitting: Internal Medicine

## 2015-08-15 DIAGNOSIS — C3411 Malignant neoplasm of upper lobe, right bronchus or lung: Secondary | ICD-10-CM

## 2015-08-15 DIAGNOSIS — D509 Iron deficiency anemia, unspecified: Secondary | ICD-10-CM

## 2015-08-17 DIAGNOSIS — I129 Hypertensive chronic kidney disease with stage 1 through stage 4 chronic kidney disease, or unspecified chronic kidney disease: Secondary | ICD-10-CM | POA: Diagnosis not present

## 2015-08-17 DIAGNOSIS — C3411 Malignant neoplasm of upper lobe, right bronchus or lung: Secondary | ICD-10-CM | POA: Diagnosis not present

## 2015-08-17 DIAGNOSIS — D649 Anemia, unspecified: Secondary | ICD-10-CM | POA: Diagnosis not present

## 2015-08-17 DIAGNOSIS — E039 Hypothyroidism, unspecified: Secondary | ICD-10-CM | POA: Diagnosis not present

## 2015-08-17 DIAGNOSIS — I4891 Unspecified atrial fibrillation: Secondary | ICD-10-CM | POA: Diagnosis not present

## 2015-08-17 DIAGNOSIS — N189 Chronic kidney disease, unspecified: Secondary | ICD-10-CM | POA: Diagnosis not present

## 2015-08-20 DIAGNOSIS — D649 Anemia, unspecified: Secondary | ICD-10-CM | POA: Diagnosis not present

## 2015-08-20 DIAGNOSIS — I129 Hypertensive chronic kidney disease with stage 1 through stage 4 chronic kidney disease, or unspecified chronic kidney disease: Secondary | ICD-10-CM | POA: Diagnosis not present

## 2015-08-20 DIAGNOSIS — I4891 Unspecified atrial fibrillation: Secondary | ICD-10-CM | POA: Diagnosis not present

## 2015-08-20 DIAGNOSIS — E039 Hypothyroidism, unspecified: Secondary | ICD-10-CM | POA: Diagnosis not present

## 2015-08-20 DIAGNOSIS — N189 Chronic kidney disease, unspecified: Secondary | ICD-10-CM | POA: Diagnosis not present

## 2015-08-20 DIAGNOSIS — C3411 Malignant neoplasm of upper lobe, right bronchus or lung: Secondary | ICD-10-CM | POA: Diagnosis not present

## 2015-08-21 DIAGNOSIS — D649 Anemia, unspecified: Secondary | ICD-10-CM | POA: Diagnosis not present

## 2015-08-21 DIAGNOSIS — I129 Hypertensive chronic kidney disease with stage 1 through stage 4 chronic kidney disease, or unspecified chronic kidney disease: Secondary | ICD-10-CM | POA: Diagnosis not present

## 2015-08-21 DIAGNOSIS — N189 Chronic kidney disease, unspecified: Secondary | ICD-10-CM | POA: Diagnosis not present

## 2015-08-21 DIAGNOSIS — E039 Hypothyroidism, unspecified: Secondary | ICD-10-CM | POA: Diagnosis not present

## 2015-08-21 DIAGNOSIS — I4891 Unspecified atrial fibrillation: Secondary | ICD-10-CM | POA: Diagnosis not present

## 2015-08-21 DIAGNOSIS — C3411 Malignant neoplasm of upper lobe, right bronchus or lung: Secondary | ICD-10-CM | POA: Diagnosis not present

## 2015-08-25 DIAGNOSIS — E039 Hypothyroidism, unspecified: Secondary | ICD-10-CM | POA: Diagnosis not present

## 2015-08-25 DIAGNOSIS — N189 Chronic kidney disease, unspecified: Secondary | ICD-10-CM | POA: Diagnosis not present

## 2015-08-25 DIAGNOSIS — D649 Anemia, unspecified: Secondary | ICD-10-CM | POA: Diagnosis not present

## 2015-08-25 DIAGNOSIS — C3411 Malignant neoplasm of upper lobe, right bronchus or lung: Secondary | ICD-10-CM | POA: Diagnosis not present

## 2015-08-25 DIAGNOSIS — I4891 Unspecified atrial fibrillation: Secondary | ICD-10-CM | POA: Diagnosis not present

## 2015-08-25 DIAGNOSIS — I129 Hypertensive chronic kidney disease with stage 1 through stage 4 chronic kidney disease, or unspecified chronic kidney disease: Secondary | ICD-10-CM | POA: Diagnosis not present

## 2015-09-01 DIAGNOSIS — I129 Hypertensive chronic kidney disease with stage 1 through stage 4 chronic kidney disease, or unspecified chronic kidney disease: Secondary | ICD-10-CM | POA: Diagnosis not present

## 2015-09-01 DIAGNOSIS — D649 Anemia, unspecified: Secondary | ICD-10-CM | POA: Diagnosis not present

## 2015-09-01 DIAGNOSIS — E039 Hypothyroidism, unspecified: Secondary | ICD-10-CM | POA: Diagnosis not present

## 2015-09-01 DIAGNOSIS — N189 Chronic kidney disease, unspecified: Secondary | ICD-10-CM | POA: Diagnosis not present

## 2015-09-01 DIAGNOSIS — I4891 Unspecified atrial fibrillation: Secondary | ICD-10-CM | POA: Diagnosis not present

## 2015-09-01 DIAGNOSIS — C3411 Malignant neoplasm of upper lobe, right bronchus or lung: Secondary | ICD-10-CM | POA: Diagnosis not present

## 2015-09-04 DIAGNOSIS — C3411 Malignant neoplasm of upper lobe, right bronchus or lung: Secondary | ICD-10-CM | POA: Diagnosis not present

## 2015-09-04 DIAGNOSIS — I129 Hypertensive chronic kidney disease with stage 1 through stage 4 chronic kidney disease, or unspecified chronic kidney disease: Secondary | ICD-10-CM | POA: Diagnosis not present

## 2015-09-04 DIAGNOSIS — D649 Anemia, unspecified: Secondary | ICD-10-CM | POA: Diagnosis not present

## 2015-09-04 DIAGNOSIS — E039 Hypothyroidism, unspecified: Secondary | ICD-10-CM | POA: Diagnosis not present

## 2015-09-04 DIAGNOSIS — I4891 Unspecified atrial fibrillation: Secondary | ICD-10-CM | POA: Diagnosis not present

## 2015-09-04 DIAGNOSIS — Z87891 Personal history of nicotine dependence: Secondary | ICD-10-CM | POA: Diagnosis not present

## 2015-09-04 DIAGNOSIS — N189 Chronic kidney disease, unspecified: Secondary | ICD-10-CM | POA: Diagnosis not present

## 2015-09-09 ENCOUNTER — Inpatient Hospital Stay

## 2015-09-09 ENCOUNTER — Other Ambulatory Visit: Payer: Self-pay

## 2015-09-09 ENCOUNTER — Inpatient Hospital Stay: Payer: Medicare Other

## 2015-09-09 ENCOUNTER — Inpatient Hospital Stay: Attending: Internal Medicine | Admitting: Internal Medicine

## 2015-09-09 ENCOUNTER — Inpatient Hospital Stay: Payer: Medicare Other | Admitting: Internal Medicine

## 2015-09-09 VITALS — BP 108/63 | HR 102 | Temp 96.8°F | Resp 19 | Ht <= 58 in | Wt 141.1 lb

## 2015-09-09 DIAGNOSIS — Z9221 Personal history of antineoplastic chemotherapy: Secondary | ICD-10-CM | POA: Diagnosis not present

## 2015-09-09 DIAGNOSIS — Z923 Personal history of irradiation: Secondary | ICD-10-CM | POA: Insufficient documentation

## 2015-09-09 DIAGNOSIS — I129 Hypertensive chronic kidney disease with stage 1 through stage 4 chronic kidney disease, or unspecified chronic kidney disease: Secondary | ICD-10-CM | POA: Diagnosis not present

## 2015-09-09 DIAGNOSIS — E039 Hypothyroidism, unspecified: Secondary | ICD-10-CM | POA: Insufficient documentation

## 2015-09-09 DIAGNOSIS — Z87891 Personal history of nicotine dependence: Secondary | ICD-10-CM | POA: Insufficient documentation

## 2015-09-09 DIAGNOSIS — C3411 Malignant neoplasm of upper lobe, right bronchus or lung: Secondary | ICD-10-CM

## 2015-09-09 DIAGNOSIS — N184 Chronic kidney disease, stage 4 (severe): Secondary | ICD-10-CM | POA: Diagnosis not present

## 2015-09-09 DIAGNOSIS — I4891 Unspecified atrial fibrillation: Secondary | ICD-10-CM | POA: Insufficient documentation

## 2015-09-09 DIAGNOSIS — Z79899 Other long term (current) drug therapy: Secondary | ICD-10-CM | POA: Diagnosis not present

## 2015-09-09 LAB — COMPREHENSIVE METABOLIC PANEL
ALT: 7 U/L — ABNORMAL LOW (ref 14–54)
ANION GAP: 6 (ref 5–15)
AST: 16 U/L (ref 15–41)
Albumin: 2.9 g/dL — ABNORMAL LOW (ref 3.5–5.0)
Alkaline Phosphatase: 80 U/L (ref 38–126)
BILIRUBIN TOTAL: 0.5 mg/dL (ref 0.3–1.2)
BUN: 20 mg/dL (ref 6–20)
CALCIUM: 8.5 mg/dL — AB (ref 8.9–10.3)
CO2: 25 mmol/L (ref 22–32)
CREATININE: 1.45 mg/dL — AB (ref 0.44–1.00)
Chloride: 106 mmol/L (ref 101–111)
GFR, EST AFRICAN AMERICAN: 40 mL/min — AB (ref >60)
GFR, EST NON AFRICAN AMERICAN: 35 mL/min — AB (ref >60)
Glucose, Bld: 181 mg/dL — ABNORMAL HIGH (ref 65–99)
POTASSIUM: 4 mmol/L (ref 3.5–5.1)
Sodium: 137 mmol/L (ref 135–145)
Total Protein: 7.8 g/dL (ref 6.5–8.1)

## 2015-09-09 LAB — CBC WITH DIFFERENTIAL/PLATELET
Basophils Absolute: 0 10*3/uL (ref 0–0.1)
Basophils Relative: 1 %
EOS PCT: 2 %
Eosinophils Absolute: 0.1 10*3/uL (ref 0–0.7)
HEMATOCRIT: 25 % — AB (ref 35.0–47.0)
Hemoglobin: 8.1 g/dL — ABNORMAL LOW (ref 12.0–16.0)
LYMPHS PCT: 19 %
Lymphs Abs: 0.8 10*3/uL — ABNORMAL LOW (ref 1.0–3.6)
MCH: 28.6 pg (ref 26.0–34.0)
MCHC: 32.4 g/dL (ref 32.0–36.0)
MCV: 88.5 fL (ref 80.0–100.0)
MONO ABS: 0.4 10*3/uL (ref 0.2–0.9)
MONOS PCT: 9 %
NEUTROS ABS: 2.8 10*3/uL (ref 1.4–6.5)
Neutrophils Relative %: 69 %
PLATELETS: 233 10*3/uL (ref 150–440)
RBC: 2.83 MIL/uL — ABNORMAL LOW (ref 3.80–5.20)
RDW: 19.6 % — AB (ref 11.5–14.5)
WBC: 4.1 10*3/uL (ref 3.6–11.0)

## 2015-09-09 LAB — SAMPLE TO BLOOD BANK

## 2015-09-09 NOTE — Progress Notes (Signed)
[facility] OFFICE PROGRESS NOTE  Patient Care Team: Arnetha Courser, MD as PCP - General (Family Medicine) Manya Silvas, MD (Gastroenterology) Cammie Sickle, MD as Consulting Physician (Oncology) Lavonia Dana, MD as Consulting Physician (Nephrology)   SUMMARY OF ONCOLOGIC HISTORY:  Oncology History   1. 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 CT-guided biopsy on 02/10/11 - insufficient for definite diagnosis. Rare group of atypical cells. Got chemotherapy with Paclitaxel/Carboplatin (started on 02/14/11, completed cycle 6 day 8 on 08/22/11). Also got concurrent radiation. 01/18/12 - Underwent Preoperative bronchoscopy with bronchoalveolar lavage right upper lobe. Right thoracoscopy with pleural biopsy and talc pleurodesis. Negative for malignancy. PET scan on 03/29/12 IMPRESSION: Multifocal pleural activity in the right chest suggesting pleural tumor spread. Activity is also noted in the right hilar region suggesting persistent tumor in this region. April 04, 2012 - VERISTRAT test reported as 'GOOD'. Started Tarceva on 05/17/12. July 2014 - declining performance status, unable to tolerate low dose Tarceva, was on home hospice which was discontinued since patient doing steady.     Lung cancer [facility])    Initial Diagnosis    Lung cancer ([place])       Cancer of upper lobe of right lung ([place])   07/27/2015 Initial Diagnosis    Cancer of upper lobe of right lung [facility])        INTERVAL HISTORY:  [name] with above history of recurrent/metastatic non-small cell lung cancer is here status post cycle #4 OPDIVO stopped in March 2017 because of decline in performance status/rising kidney function/intolerance.  Patient continues to have chronic shortness of breath. No worse. Patient goes  around with a walker. She gets more short of breath when she Goes on worse. She is needing to wear oxygen.  No known fevers or chills. No worsening cough.  As per family she is continuing spend most of the time resting /wheelchair for ambulation.   REVIEW OF SYSTEMS:  A complete 10 point review of system is done which is negative except mentioned above/history of present illness.   PAST MEDICAL HISTORY :  Past Medical History:  Diagnosis Date  . Anemia   . Anemia   . Atrial fibrillation ([place])    Dr. Humphrey Rolls, cardiologist  . CKD (chronic kidney disease) stage 4, GFR 15-29 ml/min ([place]) 05/01/2015  . Hypertension   . Hypothyroidism   . Lung cancer ([place])   . Lung cancer ([place])     PAST SURGICAL HISTORY :   Past Surgical History:  Procedure Laterality Date  . COLONOSCOPY N/A 10/11/2014   Procedure: COLONOSCOPY;  Surgeon: Manya Silvas, MD;  Location: [facility] ENDOSCOPY;  Service: Endoscopy;  Laterality: N/A;  . ESOPHAGOGASTRODUODENOSCOPY N/A 10/10/2014   Procedure: ESOPHAGOGASTRODUODENOSCOPY (EGD);  Surgeon: Manya Silvas, MD;  Location: [facility] ENDOSCOPY;  Service: Endoscopy;  Laterality: N/A;  . UPPER GASTROINTESTINAL ENDOSCOPY  02/24/14    FAMILY HISTORY :   Family History  Problem Relation Age of Onset  . Stomach cancer Mother   . Cancer Mother     unsure where but states "it spead all over"  . Alzheimer's disease Father   . Varicose Veins Daughter   . Diabetes Neg Hx   . Heart disease Neg Hx   . Hypertension Neg Hx   . Stroke Neg Hx   . COPD Neg Hx     SOCIAL  HISTORY:   Social History  Substance Use Topics  . Smoking status: Former Smoker    Packs/day: 0.75    Years: 30.00    Types: Cigarettes    Quit date: 01/03/2005  . Smokeless tobacco: Never Used  . Alcohol use No    ALLERGIES:  has No Known Allergies.  MEDICATIONS:  Current Outpatient Prescriptions  Medication Sig Dispense Refill  . albuterol (PROVENTIL) (2.5 MG/3ML) 0.083% nebulizer solution Inhale 3 mLs (2.5  mg total) into the lungs every 4 (four) hours as needed for wheezing or shortness of breath. 360 mL 3  . atorvastatin (LIPITOR) 20 MG tablet Take 20 mg by mouth at bedtime.     . benzonatate (TESSALON) 100 MG capsule Take 1 capsule (100 mg total) by mouth 3 (three) times daily as needed for cough. 90 capsule 1  . carvedilol (COREG) 3.125 MG tablet Take 6.25 mg by mouth 2 (two) times daily with a meal.     . chlorpheniramine (ALLERGY) 4 MG tablet Take 4 mg by mouth every 4 (four) hours as needed. Reported on 01/02/2015    . ferrous gluconate (FERATE) 240 (27 FE) MG tablet Take 240 mg by mouth 3 (three) times daily with meals.    . gabapentin (NEURONTIN) 100 MG capsule Take 1 capsule by mouth two times daily 180 capsule 1  . GNP COUGH DM ER 30 MG/5ML liquid TAKE 1 TEASPOONFUL EVERY 6 HOURS AS NEEDED FOR COUGH. 150 mL 0  . guaiFENesin-codeine (ROBITUSSIN AC) 100-10 MG/5ML syrup Take 5 mLs by mouth 3 (three) times daily as needed for cough. 120 mL 0  . isosorbide mononitrate (IMDUR) 30 MG 24 hr tablet Take 30 mg by mouth daily.    Marland Kitchen levothyroxine (SYNTHROID, LEVOTHROID) 50 MCG tablet Take 1 tablet by mouth  daily for 5 days each week  and take one-half tablet by mouth daily for 2 days each week 78 tablet 0  . megestrol (MEGACE) 40 MG/ML suspension TAKE 15 MLS BY MOUTH DAILY 240 mL 0  . Multiple Vitamin (MULTIVITAMIN WITH MINERALS) TABS tablet Take 1 tablet by mouth daily.    . pantoprazole (PROTONIX) 40 MG tablet Take 40 mg by mouth daily.    . potassium chloride SA (K-DUR,KLOR-CON) 20 MEQ tablet Take 20 mEq by mouth.     No current facility-administered medications for this visit.    Facility-Administered Medications Ordered in Other Visits  Medication Dose Route Frequency Provider Last Rate Last Dose  . 0.9 %  sodium chloride infusion   Intravenous Continuous Leia Alf, MD 20 mL/hr at 07/04/14 1150    . heparin lock flush 100 unit/mL  500 Units Intravenous Once Leia Alf, MD      .  heparin lock flush 100 unit/mL  500 Units Intravenous Once Leia Alf, MD      . sodium chloride 0.9 % injection 10 mL  10 mL Intravenous PRN Leia Alf, MD      . sodium chloride 0.9 % injection 10 mL  10 mL Intravenous PRN Leia Alf, MD   10 mL at 07/04/14 1145    PHYSICAL EXAMINATION: ECOG PERFORMANCE STATUS: 3 - Symptomatic, >50% confined to bed  BP 108/63 (BP Location: Left Arm, Patient Position: Sitting)   Pulse (!) 102   Temp (!) 96.8 F (36 C) (Tympanic)   Resp 19   Ht '4\' 9"'$  (1.448 m)   Wt 141 lb 1.6 oz (64 kg)   SpO2 95%   BMI 30.53 kg/m   [employer]  09/09/15 0935  Weight: 141 lb 1.6 oz (64 kg)    GENERAL:  Alert, no distress and comfortable. Thin built/moderately nourished  In a wheel chair;  She is accompanied by husband.  EYES: no pallor or icterus OROPHARYNX: no thrush or ulceration; poor dentition  NECK: supple, no masses felt LYMPH:  no palpable lymphadenopathy in the cervical, axillary or inguinal regions LUNGS: Decreased breath sounds bilaterally at the bases. Scattered wheeze noted or crackles HEART/CVS: regular rate & rhythm and no murmurs; No lower extremity edema ABDOMEN:abdomen soft, non-tender and normal bowel sounds Musculoskeletal:no cyanosis of digits and no clubbing  PSYCH: alert & oriented x 3 with fluent speech NEURO: no focal motor/sensory deficits SKIN:  no rashes or significant lesions; Left big toe- toenail coming off no signs of infection.  LABORATORY DATA:  I have reviewed the data as listed    Component Value Date/Time   NA 137 09/09/2015 0900   NA 136 08/19/2014 1337   NA 135 (L) 02/08/2014 0844   K 4.0 09/09/2015 0900   K 4.0 02/08/2014 0844   CL 106 09/09/2015 0900   CL 105 02/08/2014 0844   CO2 25 09/09/2015 0900   CO2 21 02/08/2014 0844   GLUCOSE 181 (H) 09/09/2015 0900   GLUCOSE 157 (H) 02/08/2014 0844   BUN 20 09/09/2015 0900   BUN 32 (H) 08/19/2014 1337   BUN 15 02/08/2014 0844   CREATININE 1.45 (H)  09/09/2015 0900   CREATININE 1.36 (H) 02/08/2014 0844   CALCIUM 8.5 (L) 09/09/2015 0900   CALCIUM 8.8 02/08/2014 0844   PROT 7.8 09/09/2015 0900   PROT 7.9 12/06/2013 1453   ALBUMIN 2.9 (L) 09/09/2015 0900   ALBUMIN 2.8 (L) 12/06/2013 1453   AST 16 09/09/2015 0900   AST 16 12/06/2013 1453   ALT 7 (L) 09/09/2015 0900   ALT 12 (L) 12/06/2013 1453   ALKPHOS 80 09/09/2015 0900   ALKPHOS 82 12/06/2013 1453   BILITOT 0.5 09/09/2015 0900   BILITOT 0.3 12/06/2013 1453   GFRNONAA 35 (L) 09/09/2015 0900   GFRNONAA 41 (L) 02/08/2014 0844   GFRNONAA 32 (L) 07/30/2013 1508   GFRAA 40 (L) 09/09/2015 0900   GFRAA 49 (L) 02/08/2014 0844   GFRAA 37 (L) 07/30/2013 1508    No results found for: SPEP, UPEP  Lab Results  Component Value Date   WBC 4.1 09/09/2015   NEUTROABS 2.8 09/09/2015   HGB 8.1 (L) 09/09/2015   HCT 25.0 (L) 09/09/2015   MCV 88.5 09/09/2015   PLT 233 09/09/2015      Chemistry      Component Value Date/Time   NA 137 09/09/2015 0900   NA 136 08/19/2014 1337   NA 135 (L) 02/08/2014 0844   K 4.0 09/09/2015 0900   K 4.0 02/08/2014 0844   CL 106 09/09/2015 0900   CL 105 02/08/2014 0844   CO2 25 09/09/2015 0900   CO2 21 02/08/2014 0844   BUN 20 09/09/2015 0900   BUN 32 (H) 08/19/2014 1337   BUN 15 02/08/2014 0844   CREATININE 1.45 (H) 09/09/2015 0900   CREATININE 1.36 (H) 02/08/2014 0844      Component Value Date/Time   CALCIUM 8.5 (L) 09/09/2015 0900   CALCIUM 8.8 02/08/2014 0844   ALKPHOS 80 09/09/2015 0900   ALKPHOS 82 12/06/2013 1453   AST 16 09/09/2015 0900   AST 16 12/06/2013 1453   ALT 7 (L) 09/09/2015 0900   ALT 12 (L) 12/06/2013 1453   BILITOT 0.5  09/09/2015 0900   BILITOT 0.3 12/06/2013 1453        ASSESSMENT & PLAN:   Cancer of upper lobe of right lung (H[facility]) # LUNG CA- non-small cell lung stage IV; s/p Opdivo every 2 weeks x#4; PET scan MARCH 2017-right lower lobe lesion improving in size [currently 2.2 x 2.5 from 3.5 x3cm]; and the right  lung continues to show extensive radiation changes.  Patient's treatments are currently on hold because of declining performance status;  worsening renal function.   # Clinically patient is stable; continue monitoring for now. No obvious progression of her lung cancer on a clinical basis. Continue hospice care.  # Hemoglobin is 8.1. No transfusion planned. Continue PO iron. Hold tube at next visit.   # chronic kidney disease with Creatinine -improving around 1.45.  # port flush- thru hospice RN.   # Patient follow-up with me in 8 weeks/labs/no treatment.   # 15 minutes face-to-face with the patient discussing the above plan of care; more than 50% of time spent on natural history; counseling and coordination.     Cammie Sickle, MD 09/09/2015 6:02 PM

## 2015-09-09 NOTE — Assessment & Plan Note (Addendum)
#   LUNG CA- non-small cell lung stage IV; s/p Opdivo every 2 weeks x#4; PET scan MARCH 2017-right lower lobe lesion improving in size [currently 2.2 x 2.5 from 3.5 x3cm]; and the right lung continues to show extensive radiation changes.  Patient's treatments are currently on hold because of declining performance status;  worsening renal function.   # Clinically patient is stable; continue monitoring for now. No obvious progression of her lung cancer on a clinical basis. Continue hospice care.  # Hemoglobin is 8.1. No transfusion planned. Continue PO iron. Hold tube at next visit.   # chronic kidney disease with Creatinine -improving around 1.45.  # port flush- thru hospice RN.   # Patient follow-up with me in 8 weeks/labs/no treatment.   # 15 minutes face-to-face with the patient discussing the above plan of care; more than 50% of time spent on natural history; counseling and coordination.

## 2015-09-09 NOTE — Progress Notes (Signed)
No changes since last visit. 

## 2015-09-10 ENCOUNTER — Other Ambulatory Visit: Payer: Self-pay | Admitting: *Deleted

## 2015-09-10 DIAGNOSIS — N189 Chronic kidney disease, unspecified: Secondary | ICD-10-CM | POA: Diagnosis not present

## 2015-09-10 DIAGNOSIS — C3411 Malignant neoplasm of upper lobe, right bronchus or lung: Secondary | ICD-10-CM | POA: Diagnosis not present

## 2015-09-10 DIAGNOSIS — I4891 Unspecified atrial fibrillation: Secondary | ICD-10-CM | POA: Diagnosis not present

## 2015-09-10 DIAGNOSIS — D649 Anemia, unspecified: Secondary | ICD-10-CM | POA: Diagnosis not present

## 2015-09-10 DIAGNOSIS — R059 Cough, unspecified: Secondary | ICD-10-CM

## 2015-09-10 DIAGNOSIS — I129 Hypertensive chronic kidney disease with stage 1 through stage 4 chronic kidney disease, or unspecified chronic kidney disease: Secondary | ICD-10-CM | POA: Diagnosis not present

## 2015-09-10 DIAGNOSIS — E039 Hypothyroidism, unspecified: Secondary | ICD-10-CM | POA: Diagnosis not present

## 2015-09-10 DIAGNOSIS — R05 Cough: Secondary | ICD-10-CM

## 2015-09-10 MED ORDER — GUAIFENESIN-CODEINE 100-10 MG/5ML PO SYRP: 5.0000 mL | ORAL_SOLUTION | Freq: Three times a day (TID) | ORAL | 0 refills | Status: DC | PRN

## 2015-09-11 DIAGNOSIS — I4891 Unspecified atrial fibrillation: Secondary | ICD-10-CM | POA: Diagnosis not present

## 2015-09-11 DIAGNOSIS — N189 Chronic kidney disease, unspecified: Secondary | ICD-10-CM | POA: Diagnosis not present

## 2015-09-11 DIAGNOSIS — E039 Hypothyroidism, unspecified: Secondary | ICD-10-CM | POA: Diagnosis not present

## 2015-09-11 DIAGNOSIS — I129 Hypertensive chronic kidney disease with stage 1 through stage 4 chronic kidney disease, or unspecified chronic kidney disease: Secondary | ICD-10-CM | POA: Diagnosis not present

## 2015-09-11 DIAGNOSIS — D649 Anemia, unspecified: Secondary | ICD-10-CM | POA: Diagnosis not present

## 2015-09-11 DIAGNOSIS — C3411 Malignant neoplasm of upper lobe, right bronchus or lung: Secondary | ICD-10-CM | POA: Diagnosis not present

## 2015-09-15 DIAGNOSIS — N189 Chronic kidney disease, unspecified: Secondary | ICD-10-CM | POA: Diagnosis not present

## 2015-09-15 DIAGNOSIS — D649 Anemia, unspecified: Secondary | ICD-10-CM | POA: Diagnosis not present

## 2015-09-15 DIAGNOSIS — C3411 Malignant neoplasm of upper lobe, right bronchus or lung: Secondary | ICD-10-CM | POA: Diagnosis not present

## 2015-09-15 DIAGNOSIS — E039 Hypothyroidism, unspecified: Secondary | ICD-10-CM | POA: Diagnosis not present

## 2015-09-15 DIAGNOSIS — I4891 Unspecified atrial fibrillation: Secondary | ICD-10-CM | POA: Diagnosis not present

## 2015-09-15 DIAGNOSIS — I129 Hypertensive chronic kidney disease with stage 1 through stage 4 chronic kidney disease, or unspecified chronic kidney disease: Secondary | ICD-10-CM | POA: Diagnosis not present

## 2015-09-18 DIAGNOSIS — C3411 Malignant neoplasm of upper lobe, right bronchus or lung: Secondary | ICD-10-CM | POA: Diagnosis not present

## 2015-09-18 DIAGNOSIS — N189 Chronic kidney disease, unspecified: Secondary | ICD-10-CM | POA: Diagnosis not present

## 2015-09-18 DIAGNOSIS — E039 Hypothyroidism, unspecified: Secondary | ICD-10-CM | POA: Diagnosis not present

## 2015-09-18 DIAGNOSIS — I129 Hypertensive chronic kidney disease with stage 1 through stage 4 chronic kidney disease, or unspecified chronic kidney disease: Secondary | ICD-10-CM | POA: Diagnosis not present

## 2015-09-18 DIAGNOSIS — I4891 Unspecified atrial fibrillation: Secondary | ICD-10-CM | POA: Diagnosis not present

## 2015-09-18 DIAGNOSIS — D649 Anemia, unspecified: Secondary | ICD-10-CM | POA: Diagnosis not present

## 2015-09-23 ENCOUNTER — Other Ambulatory Visit: Payer: Medicare Other

## 2015-09-23 ENCOUNTER — Ambulatory Visit: Payer: Medicare Other

## 2015-09-23 ENCOUNTER — Ambulatory Visit: Payer: Medicare Other | Admitting: Internal Medicine

## 2015-09-24 DIAGNOSIS — N189 Chronic kidney disease, unspecified: Secondary | ICD-10-CM | POA: Diagnosis not present

## 2015-09-24 DIAGNOSIS — I129 Hypertensive chronic kidney disease with stage 1 through stage 4 chronic kidney disease, or unspecified chronic kidney disease: Secondary | ICD-10-CM | POA: Diagnosis not present

## 2015-09-24 DIAGNOSIS — C3411 Malignant neoplasm of upper lobe, right bronchus or lung: Secondary | ICD-10-CM | POA: Diagnosis not present

## 2015-09-24 DIAGNOSIS — I4891 Unspecified atrial fibrillation: Secondary | ICD-10-CM | POA: Diagnosis not present

## 2015-09-24 DIAGNOSIS — E039 Hypothyroidism, unspecified: Secondary | ICD-10-CM | POA: Diagnosis not present

## 2015-09-24 DIAGNOSIS — D649 Anemia, unspecified: Secondary | ICD-10-CM | POA: Diagnosis not present

## 2015-09-25 ENCOUNTER — Other Ambulatory Visit: Payer: Self-pay | Admitting: Internal Medicine

## 2015-10-01 DIAGNOSIS — D649 Anemia, unspecified: Secondary | ICD-10-CM | POA: Diagnosis not present

## 2015-10-01 DIAGNOSIS — E039 Hypothyroidism, unspecified: Secondary | ICD-10-CM | POA: Diagnosis not present

## 2015-10-01 DIAGNOSIS — I4891 Unspecified atrial fibrillation: Secondary | ICD-10-CM | POA: Diagnosis not present

## 2015-10-01 DIAGNOSIS — C3411 Malignant neoplasm of upper lobe, right bronchus or lung: Secondary | ICD-10-CM | POA: Diagnosis not present

## 2015-10-01 DIAGNOSIS — N189 Chronic kidney disease, unspecified: Secondary | ICD-10-CM | POA: Diagnosis not present

## 2015-10-01 DIAGNOSIS — I129 Hypertensive chronic kidney disease with stage 1 through stage 4 chronic kidney disease, or unspecified chronic kidney disease: Secondary | ICD-10-CM | POA: Diagnosis not present

## 2015-10-04 ENCOUNTER — Other Ambulatory Visit: Payer: Self-pay | Admitting: Family Medicine

## 2015-10-04 DIAGNOSIS — C3411 Malignant neoplasm of upper lobe, right bronchus or lung: Secondary | ICD-10-CM | POA: Diagnosis not present

## 2015-10-04 DIAGNOSIS — I129 Hypertensive chronic kidney disease with stage 1 through stage 4 chronic kidney disease, or unspecified chronic kidney disease: Secondary | ICD-10-CM | POA: Diagnosis not present

## 2015-10-04 DIAGNOSIS — E039 Hypothyroidism, unspecified: Secondary | ICD-10-CM | POA: Diagnosis not present

## 2015-10-04 DIAGNOSIS — N189 Chronic kidney disease, unspecified: Secondary | ICD-10-CM | POA: Diagnosis not present

## 2015-10-04 DIAGNOSIS — I4891 Unspecified atrial fibrillation: Secondary | ICD-10-CM | POA: Diagnosis not present

## 2015-10-04 DIAGNOSIS — Z87891 Personal history of nicotine dependence: Secondary | ICD-10-CM | POA: Diagnosis not present

## 2015-10-04 DIAGNOSIS — D649 Anemia, unspecified: Secondary | ICD-10-CM | POA: Diagnosis not present

## 2015-10-05 DIAGNOSIS — I420 Dilated cardiomyopathy: Secondary | ICD-10-CM | POA: Diagnosis not present

## 2015-10-05 DIAGNOSIS — I428 Other cardiomyopathies: Secondary | ICD-10-CM | POA: Diagnosis not present

## 2015-10-05 DIAGNOSIS — I4891 Unspecified atrial fibrillation: Secondary | ICD-10-CM | POA: Diagnosis not present

## 2015-10-05 DIAGNOSIS — I10 Essential (primary) hypertension: Secondary | ICD-10-CM | POA: Diagnosis not present

## 2015-10-09 DIAGNOSIS — D649 Anemia, unspecified: Secondary | ICD-10-CM | POA: Diagnosis not present

## 2015-10-09 DIAGNOSIS — I4891 Unspecified atrial fibrillation: Secondary | ICD-10-CM | POA: Diagnosis not present

## 2015-10-09 DIAGNOSIS — I129 Hypertensive chronic kidney disease with stage 1 through stage 4 chronic kidney disease, or unspecified chronic kidney disease: Secondary | ICD-10-CM | POA: Diagnosis not present

## 2015-10-09 DIAGNOSIS — E039 Hypothyroidism, unspecified: Secondary | ICD-10-CM | POA: Diagnosis not present

## 2015-10-09 DIAGNOSIS — N189 Chronic kidney disease, unspecified: Secondary | ICD-10-CM | POA: Diagnosis not present

## 2015-10-09 DIAGNOSIS — C3411 Malignant neoplasm of upper lobe, right bronchus or lung: Secondary | ICD-10-CM | POA: Diagnosis not present

## 2015-10-12 DIAGNOSIS — I129 Hypertensive chronic kidney disease with stage 1 through stage 4 chronic kidney disease, or unspecified chronic kidney disease: Secondary | ICD-10-CM | POA: Diagnosis not present

## 2015-10-12 DIAGNOSIS — E039 Hypothyroidism, unspecified: Secondary | ICD-10-CM | POA: Diagnosis not present

## 2015-10-12 DIAGNOSIS — N189 Chronic kidney disease, unspecified: Secondary | ICD-10-CM | POA: Diagnosis not present

## 2015-10-12 DIAGNOSIS — C3411 Malignant neoplasm of upper lobe, right bronchus or lung: Secondary | ICD-10-CM | POA: Diagnosis not present

## 2015-10-12 DIAGNOSIS — I4891 Unspecified atrial fibrillation: Secondary | ICD-10-CM | POA: Diagnosis not present

## 2015-10-12 DIAGNOSIS — D649 Anemia, unspecified: Secondary | ICD-10-CM | POA: Diagnosis not present

## 2015-10-13 DIAGNOSIS — C3411 Malignant neoplasm of upper lobe, right bronchus or lung: Secondary | ICD-10-CM | POA: Diagnosis not present

## 2015-10-13 DIAGNOSIS — I129 Hypertensive chronic kidney disease with stage 1 through stage 4 chronic kidney disease, or unspecified chronic kidney disease: Secondary | ICD-10-CM | POA: Diagnosis not present

## 2015-10-13 DIAGNOSIS — I4891 Unspecified atrial fibrillation: Secondary | ICD-10-CM | POA: Diagnosis not present

## 2015-10-13 DIAGNOSIS — E039 Hypothyroidism, unspecified: Secondary | ICD-10-CM | POA: Diagnosis not present

## 2015-10-13 DIAGNOSIS — D649 Anemia, unspecified: Secondary | ICD-10-CM | POA: Diagnosis not present

## 2015-10-13 DIAGNOSIS — N189 Chronic kidney disease, unspecified: Secondary | ICD-10-CM | POA: Diagnosis not present

## 2015-10-14 DIAGNOSIS — C3411 Malignant neoplasm of upper lobe, right bronchus or lung: Secondary | ICD-10-CM | POA: Diagnosis not present

## 2015-10-14 DIAGNOSIS — I4891 Unspecified atrial fibrillation: Secondary | ICD-10-CM | POA: Diagnosis not present

## 2015-10-14 DIAGNOSIS — D649 Anemia, unspecified: Secondary | ICD-10-CM | POA: Diagnosis not present

## 2015-10-14 DIAGNOSIS — N189 Chronic kidney disease, unspecified: Secondary | ICD-10-CM | POA: Diagnosis not present

## 2015-10-14 DIAGNOSIS — I129 Hypertensive chronic kidney disease with stage 1 through stage 4 chronic kidney disease, or unspecified chronic kidney disease: Secondary | ICD-10-CM | POA: Diagnosis not present

## 2015-10-14 DIAGNOSIS — E039 Hypothyroidism, unspecified: Secondary | ICD-10-CM | POA: Diagnosis not present

## 2015-10-21 DIAGNOSIS — D649 Anemia, unspecified: Secondary | ICD-10-CM | POA: Diagnosis not present

## 2015-10-21 DIAGNOSIS — I129 Hypertensive chronic kidney disease with stage 1 through stage 4 chronic kidney disease, or unspecified chronic kidney disease: Secondary | ICD-10-CM | POA: Diagnosis not present

## 2015-10-21 DIAGNOSIS — E039 Hypothyroidism, unspecified: Secondary | ICD-10-CM | POA: Diagnosis not present

## 2015-10-21 DIAGNOSIS — I4891 Unspecified atrial fibrillation: Secondary | ICD-10-CM | POA: Diagnosis not present

## 2015-10-21 DIAGNOSIS — N189 Chronic kidney disease, unspecified: Secondary | ICD-10-CM | POA: Diagnosis not present

## 2015-10-21 DIAGNOSIS — C3411 Malignant neoplasm of upper lobe, right bronchus or lung: Secondary | ICD-10-CM | POA: Diagnosis not present

## 2015-10-22 DIAGNOSIS — N189 Chronic kidney disease, unspecified: Secondary | ICD-10-CM | POA: Diagnosis not present

## 2015-10-22 DIAGNOSIS — D649 Anemia, unspecified: Secondary | ICD-10-CM | POA: Diagnosis not present

## 2015-10-22 DIAGNOSIS — I4891 Unspecified atrial fibrillation: Secondary | ICD-10-CM | POA: Diagnosis not present

## 2015-10-22 DIAGNOSIS — E039 Hypothyroidism, unspecified: Secondary | ICD-10-CM | POA: Diagnosis not present

## 2015-10-22 DIAGNOSIS — C3411 Malignant neoplasm of upper lobe, right bronchus or lung: Secondary | ICD-10-CM | POA: Diagnosis not present

## 2015-10-22 DIAGNOSIS — I129 Hypertensive chronic kidney disease with stage 1 through stage 4 chronic kidney disease, or unspecified chronic kidney disease: Secondary | ICD-10-CM | POA: Diagnosis not present

## 2015-10-28 ENCOUNTER — Other Ambulatory Visit: Payer: Self-pay | Admitting: Internal Medicine

## 2015-10-28 DIAGNOSIS — N189 Chronic kidney disease, unspecified: Secondary | ICD-10-CM | POA: Diagnosis not present

## 2015-10-28 DIAGNOSIS — I129 Hypertensive chronic kidney disease with stage 1 through stage 4 chronic kidney disease, or unspecified chronic kidney disease: Secondary | ICD-10-CM | POA: Diagnosis not present

## 2015-10-28 DIAGNOSIS — E039 Hypothyroidism, unspecified: Secondary | ICD-10-CM | POA: Diagnosis not present

## 2015-10-28 DIAGNOSIS — C3411 Malignant neoplasm of upper lobe, right bronchus or lung: Secondary | ICD-10-CM | POA: Diagnosis not present

## 2015-10-28 DIAGNOSIS — D649 Anemia, unspecified: Secondary | ICD-10-CM | POA: Diagnosis not present

## 2015-10-28 DIAGNOSIS — I4891 Unspecified atrial fibrillation: Secondary | ICD-10-CM | POA: Diagnosis not present

## 2015-10-29 DIAGNOSIS — I4891 Unspecified atrial fibrillation: Secondary | ICD-10-CM | POA: Diagnosis not present

## 2015-10-29 DIAGNOSIS — E039 Hypothyroidism, unspecified: Secondary | ICD-10-CM | POA: Diagnosis not present

## 2015-10-29 DIAGNOSIS — D649 Anemia, unspecified: Secondary | ICD-10-CM | POA: Diagnosis not present

## 2015-10-29 DIAGNOSIS — I129 Hypertensive chronic kidney disease with stage 1 through stage 4 chronic kidney disease, or unspecified chronic kidney disease: Secondary | ICD-10-CM | POA: Diagnosis not present

## 2015-10-29 DIAGNOSIS — C3411 Malignant neoplasm of upper lobe, right bronchus or lung: Secondary | ICD-10-CM | POA: Diagnosis not present

## 2015-10-29 DIAGNOSIS — N189 Chronic kidney disease, unspecified: Secondary | ICD-10-CM | POA: Diagnosis not present

## 2015-11-02 ENCOUNTER — Other Ambulatory Visit: Payer: Self-pay | Admitting: Internal Medicine

## 2015-11-02 DIAGNOSIS — D509 Iron deficiency anemia, unspecified: Secondary | ICD-10-CM

## 2015-11-02 DIAGNOSIS — N189 Chronic kidney disease, unspecified: Secondary | ICD-10-CM | POA: Diagnosis not present

## 2015-11-02 DIAGNOSIS — I4891 Unspecified atrial fibrillation: Secondary | ICD-10-CM | POA: Diagnosis not present

## 2015-11-02 DIAGNOSIS — C3411 Malignant neoplasm of upper lobe, right bronchus or lung: Secondary | ICD-10-CM

## 2015-11-02 DIAGNOSIS — D649 Anemia, unspecified: Secondary | ICD-10-CM | POA: Diagnosis not present

## 2015-11-02 DIAGNOSIS — E039 Hypothyroidism, unspecified: Secondary | ICD-10-CM | POA: Diagnosis not present

## 2015-11-02 DIAGNOSIS — I129 Hypertensive chronic kidney disease with stage 1 through stage 4 chronic kidney disease, or unspecified chronic kidney disease: Secondary | ICD-10-CM | POA: Diagnosis not present

## 2015-11-04 ENCOUNTER — Other Ambulatory Visit: Payer: Self-pay | Admitting: *Deleted

## 2015-11-04 ENCOUNTER — Inpatient Hospital Stay

## 2015-11-04 ENCOUNTER — Inpatient Hospital Stay: Attending: Internal Medicine | Admitting: Internal Medicine

## 2015-11-04 VITALS — BP 112/65 | HR 94 | Temp 98.3°F | Resp 18 | Wt 145.0 lb

## 2015-11-04 DIAGNOSIS — I129 Hypertensive chronic kidney disease with stage 1 through stage 4 chronic kidney disease, or unspecified chronic kidney disease: Secondary | ICD-10-CM | POA: Diagnosis not present

## 2015-11-04 DIAGNOSIS — C3411 Malignant neoplasm of upper lobe, right bronchus or lung: Secondary | ICD-10-CM | POA: Insufficient documentation

## 2015-11-04 DIAGNOSIS — Z923 Personal history of irradiation: Secondary | ICD-10-CM | POA: Insufficient documentation

## 2015-11-04 DIAGNOSIS — Z87891 Personal history of nicotine dependence: Secondary | ICD-10-CM | POA: Insufficient documentation

## 2015-11-04 DIAGNOSIS — I4891 Unspecified atrial fibrillation: Secondary | ICD-10-CM | POA: Diagnosis not present

## 2015-11-04 DIAGNOSIS — D649 Anemia, unspecified: Secondary | ICD-10-CM | POA: Diagnosis not present

## 2015-11-04 DIAGNOSIS — Z79899 Other long term (current) drug therapy: Secondary | ICD-10-CM | POA: Insufficient documentation

## 2015-11-04 DIAGNOSIS — N189 Chronic kidney disease, unspecified: Secondary | ICD-10-CM | POA: Diagnosis not present

## 2015-11-04 DIAGNOSIS — Z9221 Personal history of antineoplastic chemotherapy: Secondary | ICD-10-CM | POA: Insufficient documentation

## 2015-11-04 DIAGNOSIS — E039 Hypothyroidism, unspecified: Secondary | ICD-10-CM | POA: Diagnosis not present

## 2015-11-04 DIAGNOSIS — N184 Chronic kidney disease, stage 4 (severe): Secondary | ICD-10-CM | POA: Diagnosis not present

## 2015-11-04 DIAGNOSIS — D631 Anemia in chronic kidney disease: Secondary | ICD-10-CM | POA: Diagnosis not present

## 2015-11-04 DIAGNOSIS — D630 Anemia in neoplastic disease: Secondary | ICD-10-CM

## 2015-11-04 LAB — COMPREHENSIVE METABOLIC PANEL
ALBUMIN: 2.6 g/dL — AB (ref 3.5–5.0)
ALT: 5 U/L — AB (ref 14–54)
AST: 15 U/L (ref 15–41)
Alkaline Phosphatase: 71 U/L (ref 38–126)
Anion gap: 7 (ref 5–15)
BUN: 24 mg/dL — AB (ref 6–20)
CHLORIDE: 102 mmol/L (ref 101–111)
CO2: 26 mmol/L (ref 22–32)
CREATININE: 1.66 mg/dL — AB (ref 0.44–1.00)
Calcium: 8.3 mg/dL — ABNORMAL LOW (ref 8.9–10.3)
GFR calc Af Amer: 34 mL/min — ABNORMAL LOW (ref >60)
GFR, EST NON AFRICAN AMERICAN: 30 mL/min — AB (ref >60)
Glucose, Bld: 180 mg/dL — ABNORMAL HIGH (ref 65–99)
POTASSIUM: 4.1 mmol/L (ref 3.5–5.1)
SODIUM: 135 mmol/L (ref 135–145)
Total Bilirubin: 0.4 mg/dL (ref 0.3–1.2)
Total Protein: 8 g/dL (ref 6.5–8.1)

## 2015-11-04 LAB — CBC WITH DIFFERENTIAL/PLATELET
Basophils Absolute: 0 10*3/uL (ref 0–0.1)
Basophils Relative: 1 %
EOS ABS: 0.1 10*3/uL (ref 0–0.7)
EOS PCT: 1 %
HCT: 21.5 % — ABNORMAL LOW (ref 35.0–47.0)
HEMOGLOBIN: 6.9 g/dL — AB (ref 12.0–16.0)
LYMPHS ABS: 0.8 10*3/uL — AB (ref 1.0–3.6)
LYMPHS PCT: 13 %
MCH: 26.3 pg (ref 26.0–34.0)
MCHC: 31.9 g/dL — AB (ref 32.0–36.0)
MCV: 82.3 fL (ref 80.0–100.0)
MONOS PCT: 9 %
Monocytes Absolute: 0.5 10*3/uL (ref 0.2–0.9)
Neutro Abs: 4.4 10*3/uL (ref 1.4–6.5)
Neutrophils Relative %: 76 %
PLATELETS: 278 10*3/uL (ref 150–440)
RBC: 2.61 MIL/uL — AB (ref 3.80–5.20)
RDW: 18.1 % — ABNORMAL HIGH (ref 11.5–14.5)
WBC: 5.8 10*3/uL (ref 3.6–11.0)

## 2015-11-04 LAB — PREPARE RBC (CROSSMATCH)

## 2015-11-04 LAB — SAMPLE TO BLOOD BANK

## 2015-11-04 NOTE — Assessment & Plan Note (Signed)
#   LUNG CA- non-small cell lung stage IV; s/p Opdivo every 2 weeks x#4; PET scan MARCH 2017-right lower lobe lesion improving in size [currently 2.2 x 2.5 from 3.5 x3cm]; and the right lung continues to show extensive radiation changes.  Patient's treatments are currently on hold because of declining performance status;  worsening renal function.   # Clinically patient is stable; continue monitoring for now. No obvious progression of her lung cancer on a clinical basis. Continue hospice care.  # Hemoglobin is 6.9; recommend 2 units of PRBC transfusion. Continue PO iron TID. Hold tube at next visit.   # chronic kidney disease with Creatinine -1.36/stable.   # port flush- thru hospice RN.   # Patient follow-up with me in 8 weeks/labs-hold tube/no treatment.

## 2015-11-04 NOTE — Progress Notes (Signed)
Patient is here for follow up, had Echo two weeks ago and mention a possible spot on her liver

## 2015-11-04 NOTE — Progress Notes (Signed)
[facility] OFFICE PROGRESS NOTE  Patient Care Team: Arnetha Courser, MD as PCP - General (Family Medicine) Manya Silvas, MD (Gastroenterology) Cammie Sickle, MD as Consulting Physician (Oncology) Lavonia Dana, MD as Consulting Physician (Nephrology)   SUMMARY OF ONCOLOGIC HISTORY:  Oncology History   1. 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 CT-guided biopsy on 02/10/11 - insufficient for definite diagnosis. Rare group of atypical cells. Got chemotherapy with Paclitaxel/Carboplatin (started on 02/14/11, completed cycle 6 day 8 on 08/22/11). Also got concurrent radiation. 01/18/12 - Underwent Preoperative bronchoscopy with bronchoalveolar lavage right upper lobe. Right thoracoscopy with pleural biopsy and talc pleurodesis. Negative for malignancy. PET scan on 03/29/12 IMPRESSION: Multifocal pleural activity in the right chest suggesting pleural tumor spread. Activity is also noted in the right hilar region suggesting persistent tumor in this region. April 04, 2012 - VERISTRAT test reported as 'GOOD'. Started Tarceva on 05/17/12. July 2014 - declining performance status, unable to tolerate low dose Tarceva, was on home hospice which was discontinued since patient doing steady.     Lung cancer [facility])    Initial Diagnosis    Lung cancer ([place])       Cancer of upper lobe of right lung ([place])   07/27/2015 Initial Diagnosis    Cancer of upper lobe of right lung [facility])        INTERVAL HISTORY:  [name] with above history of recurrent/metastatic non-small cell lung cancer is here status post cycle #4 OPDIVO stopped in March 2017 because of decline in performance status/rising kidney function/intolerance.  Patient complains of shortness of breath especially with exertion. She continues  to go around with a walker. Last blood transfusion was approximately 3 months ago. She continues to wear oxygen. No known fevers or chills. No worsening cough.  As per family she is continuing spend most of the time resting /wheelchair for ambulation.   REVIEW OF SYSTEMS:  A complete 10 point review of system is done which is negative except mentioned above/history of present illness.   PAST MEDICAL HISTORY :  Past Medical History:  Diagnosis Date  . Anemia   . Anemia   . Atrial fibrillation ([place])    Dr. Humphrey Rolls, cardiologist  . CKD (chronic kidney disease) stage 4, GFR 15-29 ml/min ([place]) 05/01/2015  . Hypertension   . Hypothyroidism   . Lung cancer ([place])   . Lung cancer ([place])     PAST SURGICAL HISTORY :   Past Surgical History:  Procedure Laterality Date  . COLONOSCOPY N/A 10/11/2014   Procedure: COLONOSCOPY;  Surgeon: Manya Silvas, MD;  Location: [facility] ENDOSCOPY;  Service: Endoscopy;  Laterality: N/A;  . ESOPHAGOGASTRODUODENOSCOPY N/A 10/10/2014   Procedure: ESOPHAGOGASTRODUODENOSCOPY (EGD);  Surgeon: Manya Silvas, MD;  Location: [facility] ENDOSCOPY;  Service: Endoscopy;  Laterality: N/A;  . UPPER GASTROINTESTINAL ENDOSCOPY  02/24/14    FAMILY HISTORY :   Family History  Problem Relation Age of Onset  . Stomach cancer Mother   . Cancer Mother     unsure where but states "it spead all over"  . Alzheimer's disease Father   . Varicose Veins Daughter   . Diabetes Neg Hx   . Heart disease Neg Hx   . Hypertension Neg Hx   . Stroke Neg Hx   . COPD Neg Hx     SOCIAL HISTORY:   Social  History  Substance Use Topics  . Smoking status: Former Smoker    Packs/day: 0.75    Years: 30.00    Types: Cigarettes    Quit date: 01/03/2005  . Smokeless tobacco: Never Used  . Alcohol use No    ALLERGIES:  has No Known Allergies.  MEDICATIONS:  Current Outpatient Prescriptions  Medication Sig Dispense Refill  . albuterol (PROVENTIL) (2.5 MG/3ML) 0.083% nebulizer solution Inhale 3 mLs  (2.5 mg total) into the lungs every 4 (four) hours as needed for wheezing or shortness of breath. 360 mL 3  . atorvastatin (LIPITOR) 20 MG tablet Take 20 mg by mouth at bedtime.     . benzonatate (TESSALON) 100 MG capsule TAKE 1 CAPSULE BY MOUTH 3 TIMES DAILY ASNEEDED FOR COUGH 90 capsule 0  . Calcium Carb-Cholecalciferol (CALCIUM 600/VITAMIN D3 PO) Take by mouth.    . carvedilol (COREG) 3.125 MG tablet Take 6.25 mg by mouth 2 (two) times daily with a meal.     . chlorpheniramine (ALLERGY) 4 MG tablet Take 4 mg by mouth every 4 (four) hours as needed. Reported on 01/02/2015    . ferrous gluconate (FERATE) 240 (27 FE) MG tablet Take 240 mg by mouth 3 (three) times daily with meals.    . gabapentin (NEURONTIN) 100 MG capsule Take 1 capsule by mouth two times daily 180 capsule 1  . GNP COUGH DM ER 30 MG/5ML liquid TAKE 1 TEASPOONFUL EVERY 6 HOURS AS NEEDED FOR COUGH 150 mL 2  . guaiFENesin-codeine (ROBITUSSIN AC) 100-10 MG/5ML syrup Take 5 mLs by mouth 3 (three) times daily as needed for cough. 120 mL 0  . isosorbide mononitrate (IMDUR) 30 MG 24 hr tablet Take 30 mg by mouth daily.    Marland Kitchen levothyroxine (SYNTHROID, LEVOTHROID) 50 MCG tablet TAKE 1 TABLET BY MOUTH  DAILY FOR 5 DAYS EACH WEEK  AND TAKE ONE-HALF TABLET BY MOUTH DAILY FOR 2 DAYS EACH WEEK 78 tablet 1  . megestrol (MEGACE) 40 MG/ML suspension TAKE 15 MLS BY MOUTH DAILY 240 mL 1  . Multiple Vitamin (MULTIVITAMIN WITH MINERALS) TABS tablet Take 1 tablet by mouth daily.    . pantoprazole (PROTONIX) 40 MG tablet Take 40 mg by mouth daily.    . potassium chloride SA (K-DUR,KLOR-CON) 20 MEQ tablet Take 20 mEq by mouth.    . torsemide (DEMADEX) 10 MG tablet Take 10 mg by mouth daily. Take Monday, Wednesday and Friday     No current facility-administered medications for this visit.    Facility-Administered Medications Ordered in Other Visits  Medication Dose Route Frequency Provider Last Rate Last Dose  . 0.9 %  sodium chloride infusion    Intravenous Continuous Leia Alf, MD 20 mL/hr at 07/04/14 1150    . heparin lock flush 100 unit/mL  500 Units Intravenous Once Leia Alf, MD      . heparin lock flush 100 unit/mL  500 Units Intravenous Once Leia Alf, MD      . sodium chloride 0.9 % injection 10 mL  10 mL Intravenous PRN Leia Alf, MD      . sodium chloride 0.9 % injection 10 mL  10 mL Intravenous PRN Leia Alf, MD   10 mL at 07/04/14 1145    PHYSICAL EXAMINATION: ECOG PERFORMANCE STATUS: 3 - Symptomatic, >50% confined to bed  BP 112/65 (BP Location: Left Arm, Patient Position: Sitting)   Pulse 94   Temp 98.3 F (36.8 C) (Tympanic)   Resp 18   Wt 145 lb (65.8 kg)  BMI 31.38 kg/m   [employer] Weights   11/04/15 1544  Weight: 145 lb (65.8 kg)    GENERAL:  Alert, no distress and comfortable. Thin built/moderately nourished  In a wheel chair;  She is accompanied by husband.  EYES: positive for pallor OROPHARYNX: no thrush or ulceration; poor dentition  NECK: supple, no masses felt LYMPH:  no palpable lymphadenopathy in the cervical, axillary or inguinal regions LUNGS: Decreased breath sounds bilaterally at the bases. Scattered wheeze noted or crackles HEART/CVS: regular rate & rhythm and no murmurs; No lower extremity edema ABDOMEN:abdomen soft, non-tender and normal bowel sounds Musculoskeletal:no cyanosis of digits and no clubbing  PSYCH: alert & oriented x 3 with fluent speech NEURO: no focal motor/sensory deficits SKIN:  no rashes or significant lesions;  LABORATORY DATA:  I have reviewed the data as listed    Component Value Date/Time   NA 135 11/04/2015 1515   NA 136 08/19/2014 1337   NA 135 (L) 02/08/2014 0844   K 4.1 11/04/2015 1515   K 4.0 02/08/2014 0844   CL 102 11/04/2015 1515   CL 105 02/08/2014 0844   CO2 26 11/04/2015 1515   CO2 21 02/08/2014 0844   GLUCOSE 180 (H) 11/04/2015 1515   GLUCOSE 157 (H) 02/08/2014 0844   BUN 24 (H) 11/04/2015 1515   BUN 32 (H) 08/19/2014  1337   BUN 15 02/08/2014 0844   CREATININE 1.66 (H) 11/04/2015 1515   CREATININE 1.36 (H) 02/08/2014 0844   CALCIUM 8.3 (L) 11/04/2015 1515   CALCIUM 8.8 02/08/2014 0844   PROT 8.0 11/04/2015 1515   PROT 7.9 12/06/2013 1453   ALBUMIN 2.6 (L) 11/04/2015 1515   ALBUMIN 2.8 (L) 12/06/2013 1453   AST 15 11/04/2015 1515   AST 16 12/06/2013 1453   ALT 5 (L) 11/04/2015 1515   ALT 12 (L) 12/06/2013 1453   ALKPHOS 71 11/04/2015 1515   ALKPHOS 82 12/06/2013 1453   BILITOT 0.4 11/04/2015 1515   BILITOT 0.3 12/06/2013 1453   GFRNONAA 30 (L) 11/04/2015 1515   GFRNONAA 41 (L) 02/08/2014 0844   GFRNONAA 32 (L) 07/30/2013 1508   GFRAA 34 (L) 11/04/2015 1515   GFRAA 49 (L) 02/08/2014 0844   GFRAA 37 (L) 07/30/2013 1508    No results found for: SPEP, UPEP  Lab Results  Component Value Date   WBC 5.8 11/04/2015   NEUTROABS 4.4 11/04/2015   HGB 6.9 (L) 11/04/2015   HCT 21.5 (L) 11/04/2015   MCV 82.3 11/04/2015   PLT 278 11/04/2015      Chemistry      Component Value Date/Time   NA 135 11/04/2015 1515   NA 136 08/19/2014 1337   NA 135 (L) 02/08/2014 0844   K 4.1 11/04/2015 1515   K 4.0 02/08/2014 0844   CL 102 11/04/2015 1515   CL 105 02/08/2014 0844   CO2 26 11/04/2015 1515   CO2 21 02/08/2014 0844   BUN 24 (H) 11/04/2015 1515   BUN 32 (H) 08/19/2014 1337   BUN 15 02/08/2014 0844   CREATININE 1.66 (H) 11/04/2015 1515   CREATININE 1.36 (H) 02/08/2014 0844      Component Value Date/Time   CALCIUM 8.3 (L) 11/04/2015 1515   CALCIUM 8.8 02/08/2014 0844   ALKPHOS 71 11/04/2015 1515   ALKPHOS 82 12/06/2013 1453   AST 15 11/04/2015 1515   AST 16 12/06/2013 1453   ALT 5 (L) 11/04/2015 1515   ALT 12 (L) 12/06/2013 1453   BILITOT 0.4 11/04/2015 1515  BILITOT 0.3 12/06/2013 1453        ASSESSMENT & PLAN:   Cancer of upper lobe of right lung (HCC) # LUNG CA- non-small cell lung stage IV; s/p Opdivo every 2 weeks x#4; PET scan MARCH 2017-right lower lobe lesion improving in  size [currently 2.2 x 2.5 from 3.5 x3cm]; and the right lung continues to show extensive radiation changes.  Patient's treatments are currently on hold because of declining performance status;  worsening renal function.   # Clinically patient is stable; continue monitoring for now. No obvious progression of her lung cancer on a clinical basis. Continue hospice care.  # Hemoglobin is 6.9; recommend 2 units of PRBC transfusion. Continue PO iron TID. Hold tube at next visit.   # chronic kidney disease with Creatinine -1.36/stable.   # port flush- thru hospice RN.   # Patient follow-up with me in 8 weeks/labs-hold tube/no treatment.      Cammie Sickle, MD 11/04/2015 4:43 PM

## 2015-11-05 ENCOUNTER — Inpatient Hospital Stay

## 2015-11-05 DIAGNOSIS — D630 Anemia in neoplastic disease: Secondary | ICD-10-CM

## 2015-11-05 DIAGNOSIS — C3411 Malignant neoplasm of upper lobe, right bronchus or lung: Secondary | ICD-10-CM

## 2015-11-05 MED ORDER — SODIUM CHLORIDE 0.9% FLUSH
10.0000 mL | INTRAVENOUS | Status: AC | PRN
  Administered 2015-11-05: 10 mL
  Filled 2015-11-05: qty 10

## 2015-11-05 MED ORDER — HEPARIN SOD (PORK) LOCK FLUSH 100 UNIT/ML IV SOLN
500.0000 [IU] | Freq: Every day | INTRAVENOUS | Status: AC | PRN
  Administered 2015-11-05: 500 [IU]
  Filled 2015-11-05: qty 5

## 2015-11-05 MED ORDER — ACETAMINOPHEN 325 MG PO TABS
650.0000 mg | ORAL_TABLET | Freq: Once | ORAL | Status: AC
  Administered 2015-11-05: 650 mg via ORAL
  Filled 2015-11-05: qty 2

## 2015-11-05 MED ORDER — DIPHENHYDRAMINE HCL 25 MG PO CAPS
25.0000 mg | ORAL_CAPSULE | Freq: Once | ORAL | Status: AC
  Administered 2015-11-05: 25 mg via ORAL
  Filled 2015-11-05: qty 1

## 2015-11-05 MED ORDER — SODIUM CHLORIDE 0.9 % IV SOLN
250.0000 mL | Freq: Once | INTRAVENOUS | Status: AC
  Administered 2015-11-05: 250 mL via INTRAVENOUS
  Filled 2015-11-05: qty 250

## 2015-11-06 LAB — TYPE AND SCREEN
ABO/RH(D): AB POS
Antibody Screen: NEGATIVE
UNIT DIVISION: 0
UNIT DIVISION: 0

## 2015-11-13 DIAGNOSIS — N189 Chronic kidney disease, unspecified: Secondary | ICD-10-CM | POA: Diagnosis not present

## 2015-11-13 DIAGNOSIS — I4891 Unspecified atrial fibrillation: Secondary | ICD-10-CM | POA: Diagnosis not present

## 2015-11-13 DIAGNOSIS — I129 Hypertensive chronic kidney disease with stage 1 through stage 4 chronic kidney disease, or unspecified chronic kidney disease: Secondary | ICD-10-CM | POA: Diagnosis not present

## 2015-11-13 DIAGNOSIS — C3411 Malignant neoplasm of upper lobe, right bronchus or lung: Secondary | ICD-10-CM | POA: Diagnosis not present

## 2015-11-13 DIAGNOSIS — D649 Anemia, unspecified: Secondary | ICD-10-CM | POA: Diagnosis not present

## 2015-11-13 DIAGNOSIS — E039 Hypothyroidism, unspecified: Secondary | ICD-10-CM | POA: Diagnosis not present

## 2015-11-15 ENCOUNTER — Other Ambulatory Visit: Payer: Self-pay | Admitting: Internal Medicine

## 2015-11-16 ENCOUNTER — Telehealth: Payer: Self-pay | Admitting: *Deleted

## 2015-11-16 DIAGNOSIS — I129 Hypertensive chronic kidney disease with stage 1 through stage 4 chronic kidney disease, or unspecified chronic kidney disease: Secondary | ICD-10-CM | POA: Diagnosis not present

## 2015-11-16 DIAGNOSIS — E039 Hypothyroidism, unspecified: Secondary | ICD-10-CM | POA: Diagnosis not present

## 2015-11-16 DIAGNOSIS — C3411 Malignant neoplasm of upper lobe, right bronchus or lung: Secondary | ICD-10-CM | POA: Diagnosis not present

## 2015-11-16 DIAGNOSIS — N189 Chronic kidney disease, unspecified: Secondary | ICD-10-CM | POA: Diagnosis not present

## 2015-11-16 DIAGNOSIS — D649 Anemia, unspecified: Secondary | ICD-10-CM | POA: Diagnosis not present

## 2015-11-16 DIAGNOSIS — I4891 Unspecified atrial fibrillation: Secondary | ICD-10-CM | POA: Diagnosis not present

## 2015-11-16 NOTE — Telephone Encounter (Signed)
Hospice nurse called to report BP of 74/56, HR 102. Patient had one episode of nausea and vomiting. Hospice holding BP meds and allergy medications for today. Encouraging patient to drink fluids and rest. Will call back tomorrow with report. Dr. Burlene Arnt aware.

## 2015-11-17 ENCOUNTER — Telehealth: Payer: Self-pay | Admitting: *Deleted

## 2015-11-17 DIAGNOSIS — I129 Hypertensive chronic kidney disease with stage 1 through stage 4 chronic kidney disease, or unspecified chronic kidney disease: Secondary | ICD-10-CM | POA: Diagnosis not present

## 2015-11-17 DIAGNOSIS — N189 Chronic kidney disease, unspecified: Secondary | ICD-10-CM | POA: Diagnosis not present

## 2015-11-17 DIAGNOSIS — C3411 Malignant neoplasm of upper lobe, right bronchus or lung: Secondary | ICD-10-CM | POA: Diagnosis not present

## 2015-11-17 DIAGNOSIS — E039 Hypothyroidism, unspecified: Secondary | ICD-10-CM | POA: Diagnosis not present

## 2015-11-17 DIAGNOSIS — I4891 Unspecified atrial fibrillation: Secondary | ICD-10-CM | POA: Diagnosis not present

## 2015-11-17 DIAGNOSIS — D649 Anemia, unspecified: Secondary | ICD-10-CM | POA: Diagnosis not present

## 2015-11-17 NOTE — Telephone Encounter (Signed)
Called to report that her BP Is up to 120/60, but her pulse rate is up to 124 after holding 3 of her medes

## 2015-11-18 NOTE — Telephone Encounter (Signed)
Per VO Dr Rogue Bussing, restart Coreg. Mitzi informed

## 2015-11-24 DIAGNOSIS — N189 Chronic kidney disease, unspecified: Secondary | ICD-10-CM | POA: Diagnosis not present

## 2015-11-24 DIAGNOSIS — E039 Hypothyroidism, unspecified: Secondary | ICD-10-CM | POA: Diagnosis not present

## 2015-11-24 DIAGNOSIS — C3411 Malignant neoplasm of upper lobe, right bronchus or lung: Secondary | ICD-10-CM | POA: Diagnosis not present

## 2015-11-24 DIAGNOSIS — D649 Anemia, unspecified: Secondary | ICD-10-CM | POA: Diagnosis not present

## 2015-11-24 DIAGNOSIS — I129 Hypertensive chronic kidney disease with stage 1 through stage 4 chronic kidney disease, or unspecified chronic kidney disease: Secondary | ICD-10-CM | POA: Diagnosis not present

## 2015-11-24 DIAGNOSIS — I4891 Unspecified atrial fibrillation: Secondary | ICD-10-CM | POA: Diagnosis not present

## 2015-11-30 ENCOUNTER — Other Ambulatory Visit: Payer: Self-pay | Admitting: Internal Medicine

## 2015-11-30 DIAGNOSIS — E039 Hypothyroidism, unspecified: Secondary | ICD-10-CM | POA: Diagnosis not present

## 2015-11-30 DIAGNOSIS — D649 Anemia, unspecified: Secondary | ICD-10-CM | POA: Diagnosis not present

## 2015-11-30 DIAGNOSIS — C3411 Malignant neoplasm of upper lobe, right bronchus or lung: Secondary | ICD-10-CM | POA: Diagnosis not present

## 2015-11-30 DIAGNOSIS — I4891 Unspecified atrial fibrillation: Secondary | ICD-10-CM | POA: Diagnosis not present

## 2015-11-30 DIAGNOSIS — N189 Chronic kidney disease, unspecified: Secondary | ICD-10-CM | POA: Diagnosis not present

## 2015-11-30 DIAGNOSIS — I129 Hypertensive chronic kidney disease with stage 1 through stage 4 chronic kidney disease, or unspecified chronic kidney disease: Secondary | ICD-10-CM | POA: Diagnosis not present

## 2015-12-02 DIAGNOSIS — C3411 Malignant neoplasm of upper lobe, right bronchus or lung: Secondary | ICD-10-CM | POA: Diagnosis not present

## 2015-12-02 DIAGNOSIS — I4891 Unspecified atrial fibrillation: Secondary | ICD-10-CM | POA: Diagnosis not present

## 2015-12-02 DIAGNOSIS — E039 Hypothyroidism, unspecified: Secondary | ICD-10-CM | POA: Diagnosis not present

## 2015-12-02 DIAGNOSIS — N189 Chronic kidney disease, unspecified: Secondary | ICD-10-CM | POA: Diagnosis not present

## 2015-12-02 DIAGNOSIS — D649 Anemia, unspecified: Secondary | ICD-10-CM | POA: Diagnosis not present

## 2015-12-02 DIAGNOSIS — I129 Hypertensive chronic kidney disease with stage 1 through stage 4 chronic kidney disease, or unspecified chronic kidney disease: Secondary | ICD-10-CM | POA: Diagnosis not present

## 2015-12-04 DIAGNOSIS — E039 Hypothyroidism, unspecified: Secondary | ICD-10-CM | POA: Diagnosis not present

## 2015-12-04 DIAGNOSIS — D649 Anemia, unspecified: Secondary | ICD-10-CM | POA: Diagnosis not present

## 2015-12-04 DIAGNOSIS — I129 Hypertensive chronic kidney disease with stage 1 through stage 4 chronic kidney disease, or unspecified chronic kidney disease: Secondary | ICD-10-CM | POA: Diagnosis not present

## 2015-12-04 DIAGNOSIS — C3411 Malignant neoplasm of upper lobe, right bronchus or lung: Secondary | ICD-10-CM | POA: Diagnosis not present

## 2015-12-04 DIAGNOSIS — Z87891 Personal history of nicotine dependence: Secondary | ICD-10-CM | POA: Diagnosis not present

## 2015-12-04 DIAGNOSIS — I4891 Unspecified atrial fibrillation: Secondary | ICD-10-CM | POA: Diagnosis not present

## 2015-12-04 DIAGNOSIS — N189 Chronic kidney disease, unspecified: Secondary | ICD-10-CM | POA: Diagnosis not present

## 2015-12-09 DIAGNOSIS — E039 Hypothyroidism, unspecified: Secondary | ICD-10-CM | POA: Diagnosis not present

## 2015-12-09 DIAGNOSIS — C3411 Malignant neoplasm of upper lobe, right bronchus or lung: Secondary | ICD-10-CM | POA: Diagnosis not present

## 2015-12-09 DIAGNOSIS — D649 Anemia, unspecified: Secondary | ICD-10-CM | POA: Diagnosis not present

## 2015-12-09 DIAGNOSIS — I129 Hypertensive chronic kidney disease with stage 1 through stage 4 chronic kidney disease, or unspecified chronic kidney disease: Secondary | ICD-10-CM | POA: Diagnosis not present

## 2015-12-09 DIAGNOSIS — I4891 Unspecified atrial fibrillation: Secondary | ICD-10-CM | POA: Diagnosis not present

## 2015-12-09 DIAGNOSIS — N189 Chronic kidney disease, unspecified: Secondary | ICD-10-CM | POA: Diagnosis not present

## 2015-12-11 DIAGNOSIS — N189 Chronic kidney disease, unspecified: Secondary | ICD-10-CM | POA: Diagnosis not present

## 2015-12-11 DIAGNOSIS — I129 Hypertensive chronic kidney disease with stage 1 through stage 4 chronic kidney disease, or unspecified chronic kidney disease: Secondary | ICD-10-CM | POA: Diagnosis not present

## 2015-12-11 DIAGNOSIS — I4891 Unspecified atrial fibrillation: Secondary | ICD-10-CM | POA: Diagnosis not present

## 2015-12-11 DIAGNOSIS — E039 Hypothyroidism, unspecified: Secondary | ICD-10-CM | POA: Diagnosis not present

## 2015-12-11 DIAGNOSIS — D649 Anemia, unspecified: Secondary | ICD-10-CM | POA: Diagnosis not present

## 2015-12-11 DIAGNOSIS — C3411 Malignant neoplasm of upper lobe, right bronchus or lung: Secondary | ICD-10-CM | POA: Diagnosis not present

## 2015-12-14 DIAGNOSIS — I129 Hypertensive chronic kidney disease with stage 1 through stage 4 chronic kidney disease, or unspecified chronic kidney disease: Secondary | ICD-10-CM | POA: Diagnosis not present

## 2015-12-14 DIAGNOSIS — E039 Hypothyroidism, unspecified: Secondary | ICD-10-CM | POA: Diagnosis not present

## 2015-12-14 DIAGNOSIS — D649 Anemia, unspecified: Secondary | ICD-10-CM | POA: Diagnosis not present

## 2015-12-14 DIAGNOSIS — C3411 Malignant neoplasm of upper lobe, right bronchus or lung: Secondary | ICD-10-CM | POA: Diagnosis not present

## 2015-12-14 DIAGNOSIS — N189 Chronic kidney disease, unspecified: Secondary | ICD-10-CM | POA: Diagnosis not present

## 2015-12-14 DIAGNOSIS — I4891 Unspecified atrial fibrillation: Secondary | ICD-10-CM | POA: Diagnosis not present

## 2015-12-16 DIAGNOSIS — C3411 Malignant neoplasm of upper lobe, right bronchus or lung: Secondary | ICD-10-CM | POA: Diagnosis not present

## 2015-12-16 DIAGNOSIS — N189 Chronic kidney disease, unspecified: Secondary | ICD-10-CM | POA: Diagnosis not present

## 2015-12-16 DIAGNOSIS — I4891 Unspecified atrial fibrillation: Secondary | ICD-10-CM | POA: Diagnosis not present

## 2015-12-16 DIAGNOSIS — D649 Anemia, unspecified: Secondary | ICD-10-CM | POA: Diagnosis not present

## 2015-12-16 DIAGNOSIS — E039 Hypothyroidism, unspecified: Secondary | ICD-10-CM | POA: Diagnosis not present

## 2015-12-16 DIAGNOSIS — I129 Hypertensive chronic kidney disease with stage 1 through stage 4 chronic kidney disease, or unspecified chronic kidney disease: Secondary | ICD-10-CM | POA: Diagnosis not present

## 2015-12-21 ENCOUNTER — Other Ambulatory Visit: Payer: Self-pay | Admitting: Internal Medicine

## 2015-12-21 DIAGNOSIS — I4891 Unspecified atrial fibrillation: Secondary | ICD-10-CM | POA: Diagnosis not present

## 2015-12-21 DIAGNOSIS — D509 Iron deficiency anemia, unspecified: Secondary | ICD-10-CM

## 2015-12-21 DIAGNOSIS — C3411 Malignant neoplasm of upper lobe, right bronchus or lung: Secondary | ICD-10-CM | POA: Diagnosis not present

## 2015-12-21 DIAGNOSIS — N189 Chronic kidney disease, unspecified: Secondary | ICD-10-CM | POA: Diagnosis not present

## 2015-12-21 DIAGNOSIS — I129 Hypertensive chronic kidney disease with stage 1 through stage 4 chronic kidney disease, or unspecified chronic kidney disease: Secondary | ICD-10-CM | POA: Diagnosis not present

## 2015-12-21 DIAGNOSIS — D649 Anemia, unspecified: Secondary | ICD-10-CM | POA: Diagnosis not present

## 2015-12-21 DIAGNOSIS — E039 Hypothyroidism, unspecified: Secondary | ICD-10-CM | POA: Diagnosis not present

## 2015-12-22 ENCOUNTER — Ambulatory Visit: Payer: Medicare Other | Admitting: Family Medicine

## 2015-12-23 DIAGNOSIS — E039 Hypothyroidism, unspecified: Secondary | ICD-10-CM | POA: Diagnosis not present

## 2015-12-23 DIAGNOSIS — I4891 Unspecified atrial fibrillation: Secondary | ICD-10-CM | POA: Diagnosis not present

## 2015-12-23 DIAGNOSIS — D649 Anemia, unspecified: Secondary | ICD-10-CM | POA: Diagnosis not present

## 2015-12-23 DIAGNOSIS — N189 Chronic kidney disease, unspecified: Secondary | ICD-10-CM | POA: Diagnosis not present

## 2015-12-23 DIAGNOSIS — C3411 Malignant neoplasm of upper lobe, right bronchus or lung: Secondary | ICD-10-CM | POA: Diagnosis not present

## 2015-12-23 DIAGNOSIS — I129 Hypertensive chronic kidney disease with stage 1 through stage 4 chronic kidney disease, or unspecified chronic kidney disease: Secondary | ICD-10-CM | POA: Diagnosis not present

## 2015-12-24 ENCOUNTER — Ambulatory Visit: Payer: Medicare Other | Admitting: Family Medicine

## 2015-12-29 DIAGNOSIS — I4891 Unspecified atrial fibrillation: Secondary | ICD-10-CM | POA: Diagnosis not present

## 2015-12-29 DIAGNOSIS — D649 Anemia, unspecified: Secondary | ICD-10-CM | POA: Diagnosis not present

## 2015-12-29 DIAGNOSIS — E039 Hypothyroidism, unspecified: Secondary | ICD-10-CM | POA: Diagnosis not present

## 2015-12-29 DIAGNOSIS — N189 Chronic kidney disease, unspecified: Secondary | ICD-10-CM | POA: Diagnosis not present

## 2015-12-29 DIAGNOSIS — C3411 Malignant neoplasm of upper lobe, right bronchus or lung: Secondary | ICD-10-CM | POA: Diagnosis not present

## 2015-12-29 DIAGNOSIS — I129 Hypertensive chronic kidney disease with stage 1 through stage 4 chronic kidney disease, or unspecified chronic kidney disease: Secondary | ICD-10-CM | POA: Diagnosis not present

## 2016-01-04 DIAGNOSIS — I129 Hypertensive chronic kidney disease with stage 1 through stage 4 chronic kidney disease, or unspecified chronic kidney disease: Secondary | ICD-10-CM | POA: Diagnosis not present

## 2016-01-04 DIAGNOSIS — I4891 Unspecified atrial fibrillation: Secondary | ICD-10-CM | POA: Diagnosis not present

## 2016-01-04 DIAGNOSIS — E039 Hypothyroidism, unspecified: Secondary | ICD-10-CM | POA: Diagnosis not present

## 2016-01-04 DIAGNOSIS — D649 Anemia, unspecified: Secondary | ICD-10-CM | POA: Diagnosis not present

## 2016-01-04 DIAGNOSIS — C3411 Malignant neoplasm of upper lobe, right bronchus or lung: Secondary | ICD-10-CM | POA: Diagnosis not present

## 2016-01-04 DIAGNOSIS — N189 Chronic kidney disease, unspecified: Secondary | ICD-10-CM | POA: Diagnosis not present

## 2016-01-04 DIAGNOSIS — Z87891 Personal history of nicotine dependence: Secondary | ICD-10-CM | POA: Diagnosis not present

## 2016-01-05 DIAGNOSIS — I509 Heart failure, unspecified: Secondary | ICD-10-CM | POA: Diagnosis not present

## 2016-01-05 DIAGNOSIS — I428 Other cardiomyopathies: Secondary | ICD-10-CM | POA: Diagnosis not present

## 2016-01-05 DIAGNOSIS — R0602 Shortness of breath: Secondary | ICD-10-CM | POA: Diagnosis not present

## 2016-01-05 DIAGNOSIS — I4891 Unspecified atrial fibrillation: Secondary | ICD-10-CM | POA: Diagnosis not present

## 2016-01-05 DIAGNOSIS — I10 Essential (primary) hypertension: Secondary | ICD-10-CM | POA: Diagnosis not present

## 2016-01-06 ENCOUNTER — Other Ambulatory Visit: Payer: Self-pay

## 2016-01-06 ENCOUNTER — Other Ambulatory Visit: Payer: Self-pay | Admitting: *Deleted

## 2016-01-06 ENCOUNTER — Inpatient Hospital Stay

## 2016-01-06 ENCOUNTER — Telehealth: Payer: Self-pay | Admitting: *Deleted

## 2016-01-06 ENCOUNTER — Inpatient Hospital Stay: Attending: Internal Medicine | Admitting: Internal Medicine

## 2016-01-06 ENCOUNTER — Other Ambulatory Visit: Payer: Self-pay | Admitting: Internal Medicine

## 2016-01-06 VITALS — BP 100/63 | HR 97 | Temp 97.5°F | Wt 160.2 lb

## 2016-01-06 DIAGNOSIS — I4891 Unspecified atrial fibrillation: Secondary | ICD-10-CM | POA: Diagnosis not present

## 2016-01-06 DIAGNOSIS — R059 Cough, unspecified: Secondary | ICD-10-CM

## 2016-01-06 DIAGNOSIS — D631 Anemia in chronic kidney disease: Secondary | ICD-10-CM | POA: Insufficient documentation

## 2016-01-06 DIAGNOSIS — N184 Chronic kidney disease, stage 4 (severe): Secondary | ICD-10-CM | POA: Insufficient documentation

## 2016-01-06 DIAGNOSIS — D630 Anemia in neoplastic disease: Secondary | ICD-10-CM

## 2016-01-06 DIAGNOSIS — C3411 Malignant neoplasm of upper lobe, right bronchus or lung: Secondary | ICD-10-CM | POA: Insufficient documentation

## 2016-01-06 DIAGNOSIS — Z79899 Other long term (current) drug therapy: Secondary | ICD-10-CM | POA: Insufficient documentation

## 2016-01-06 DIAGNOSIS — Z87891 Personal history of nicotine dependence: Secondary | ICD-10-CM | POA: Diagnosis not present

## 2016-01-06 DIAGNOSIS — D649 Anemia, unspecified: Secondary | ICD-10-CM | POA: Diagnosis not present

## 2016-01-06 DIAGNOSIS — R05 Cough: Secondary | ICD-10-CM

## 2016-01-06 DIAGNOSIS — Z9221 Personal history of antineoplastic chemotherapy: Secondary | ICD-10-CM | POA: Insufficient documentation

## 2016-01-06 DIAGNOSIS — I129 Hypertensive chronic kidney disease with stage 1 through stage 4 chronic kidney disease, or unspecified chronic kidney disease: Secondary | ICD-10-CM | POA: Insufficient documentation

## 2016-01-06 DIAGNOSIS — N189 Chronic kidney disease, unspecified: Secondary | ICD-10-CM | POA: Diagnosis not present

## 2016-01-06 DIAGNOSIS — E039 Hypothyroidism, unspecified: Secondary | ICD-10-CM | POA: Insufficient documentation

## 2016-01-06 LAB — CBC WITH DIFFERENTIAL/PLATELET
BASOS PCT: 1 %
Basophils Absolute: 0 10*3/uL (ref 0–0.1)
EOS ABS: 0.1 10*3/uL (ref 0–0.7)
Eosinophils Relative: 1 %
HCT: 24.2 % — ABNORMAL LOW (ref 35.0–47.0)
HEMOGLOBIN: 7.7 g/dL — AB (ref 12.0–16.0)
LYMPHS ABS: 0.8 10*3/uL — AB (ref 1.0–3.6)
LYMPHS PCT: 16 %
MCH: 26.4 pg (ref 26.0–34.0)
MCHC: 32 g/dL (ref 32.0–36.0)
MCV: 82.7 fL (ref 80.0–100.0)
MONO ABS: 0.4 10*3/uL (ref 0.2–0.9)
Monocytes Relative: 7 %
NEUTROS ABS: 3.8 10*3/uL (ref 1.4–6.5)
Neutrophils Relative %: 75 %
Platelets: 261 10*3/uL (ref 150–440)
RBC: 2.93 MIL/uL — ABNORMAL LOW (ref 3.80–5.20)
RDW: 20.2 % — ABNORMAL HIGH (ref 11.5–14.5)
WBC: 5 10*3/uL (ref 3.6–11.0)

## 2016-01-06 LAB — PREPARE RBC (CROSSMATCH)

## 2016-01-06 LAB — COMPREHENSIVE METABOLIC PANEL
ALBUMIN: 2.6 g/dL — AB (ref 3.5–5.0)
ALK PHOS: 77 U/L (ref 38–126)
ALT: 5 U/L — ABNORMAL LOW (ref 14–54)
AST: 14 U/L — ABNORMAL LOW (ref 15–41)
Anion gap: 4 — ABNORMAL LOW (ref 5–15)
BUN: 15 mg/dL (ref 6–20)
CO2: 23 mmol/L (ref 22–32)
Calcium: 8.3 mg/dL — ABNORMAL LOW (ref 8.9–10.3)
Chloride: 108 mmol/L (ref 101–111)
Creatinine, Ser: 1.49 mg/dL — ABNORMAL HIGH (ref 0.44–1.00)
GFR calc Af Amer: 39 mL/min — ABNORMAL LOW (ref >60)
GFR calc non Af Amer: 34 mL/min — ABNORMAL LOW (ref >60)
GLUCOSE: 220 mg/dL — AB (ref 65–99)
POTASSIUM: 4.2 mmol/L (ref 3.5–5.1)
SODIUM: 135 mmol/L (ref 135–145)
Total Bilirubin: 0.6 mg/dL (ref 0.3–1.2)
Total Protein: 7.7 g/dL (ref 6.5–8.1)

## 2016-01-06 LAB — SAMPLE TO BLOOD BANK

## 2016-01-06 MED ORDER — FUROSEMIDE 10 MG/ML IJ SOLN
20.0000 mg | Freq: Once | INTRAMUSCULAR | Status: AC
  Administered 2016-01-06: 20 mg via INTRAVENOUS
  Filled 2016-01-06: qty 2

## 2016-01-06 MED ORDER — ACETAMINOPHEN 325 MG PO TABS
650.0000 mg | ORAL_TABLET | Freq: Once | ORAL | Status: AC
  Administered 2016-01-06: 650 mg via ORAL
  Filled 2016-01-06: qty 2

## 2016-01-06 MED ORDER — SODIUM CHLORIDE 0.9 % IV SOLN
250.0000 mL | Freq: Once | INTRAVENOUS | Status: AC
  Administered 2016-01-06: 250 mL via INTRAVENOUS
  Filled 2016-01-06: qty 250

## 2016-01-06 MED ORDER — DIPHENHYDRAMINE HCL 25 MG PO CAPS
25.0000 mg | ORAL_CAPSULE | Freq: Once | ORAL | Status: AC
  Administered 2016-01-06: 25 mg via ORAL
  Filled 2016-01-06: qty 1

## 2016-01-06 MED ORDER — HEPARIN SOD (PORK) LOCK FLUSH 100 UNIT/ML IV SOLN
250.0000 [IU] | INTRAVENOUS | Status: AC | PRN
  Administered 2016-01-06: 250 [IU]
  Filled 2016-01-06: qty 5

## 2016-01-06 MED ORDER — SODIUM CHLORIDE 0.9% FLUSH
3.0000 mL | INTRAVENOUS | Status: AC | PRN
  Administered 2016-01-06: 3 mL
  Filled 2016-01-06: qty 3

## 2016-01-06 NOTE — Progress Notes (Signed)
[place] OFFICE PROGRESS NOTE  Patient Care Team: Arnetha Courser, MD as PCP - General (Family Medicine) Manya Silvas, MD (Gastroenterology) Cammie Sickle, MD as Consulting Physician (Oncology) Lavonia Dana, MD as Consulting Physician (Nephrology)   SUMMARY OF ONCOLOGIC HISTORY:  Oncology History   1. 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 CT-guided biopsy on 02/10/11 - insufficient for definite diagnosis. Rare group of atypical cells. Got chemotherapy with Paclitaxel/Carboplatin (started on 02/14/11, completed cycle 6 day 8 on 08/22/11). Also got concurrent radiation. 01/18/12 - Underwent Preoperative bronchoscopy with bronchoalveolar lavage right upper lobe. Right thoracoscopy with pleural biopsy and talc pleurodesis. Negative for malignancy. PET scan on 03/29/12 IMPRESSION: Multifocal pleural activity in the right chest suggesting pleural tumor spread. Activity is also noted in the right hilar region suggesting persistent tumor in this region. April 04, 2012 - VERISTRAT test reported as 'GOOD'. Started Tarceva on 05/17/12. July 2014 - declining performance status, unable to tolerate low dose Tarceva, was on home hospice which was discontinued since patient doing steady.     Lung cancer [facility])    Initial Diagnosis    Lung cancer ([place])       Cancer of upper lobe of right lung ([place])   07/27/2015 Initial Diagnosis    Cancer of upper lobe of right lung [facility])        INTERVAL HISTORY:  A very pleasant 76 year old female patient with above history of recurrent/metastatic non-small cell lung cancer status post cycle #4 OPDIVO stopped in March 2017 because of decline in performance status/rising kidney function/intolerance. Patient continues pain hospice.   Patient complains of shortness of breath with exertion.  She continues to go around in a walker. The last blood transfusion was approximately 2 months ago. She continues to wear oxygen. No known fevers or chills. No worsening cough.  As per family she is continuing spend most of the time resting /wheelchair for ambulation.   REVIEW OF SYSTEMS:  A complete 10 point review of system is done which is negative except mentioned above/history of present illness.   PAST MEDICAL HISTORY :  Past Medical History:  Diagnosis Date  . Anemia   . Anemia   . Atrial fibrillation ([place])    Dr. Humphrey Rolls, cardiologist  . CKD (chronic kidney disease) stage 4, GFR 15-29 ml/min ([place]) 05/01/2015  . Hypertension   . Hypothyroidism   . Lung cancer ([place])   . Lung cancer ([place])     PAST SURGICAL HISTORY :   Past Surgical History:  Procedure Laterality Date  . COLONOSCOPY N/A 10/11/2014   Procedure: COLONOSCOPY;  Surgeon: Manya Silvas, MD;  Location: [facility] ENDOSCOPY;  Service: Endoscopy;  Laterality: N/A;  . ESOPHAGOGASTRODUODENOSCOPY N/A 10/10/2014   Procedure: ESOPHAGOGASTRODUODENOSCOPY (EGD);  Surgeon: Manya Silvas, MD;  Location: [facility] ENDOSCOPY;  Service: Endoscopy;  Laterality: N/A;  . UPPER GASTROINTESTINAL ENDOSCOPY  02/24/14    FAMILY HISTORY :   Family History  Problem Relation Age of Onset  . Stomach cancer Mother   . Cancer Mother     unsure where but states "it spead all over"  . Alzheimer's disease Father   . Varicose Veins Daughter   . Diabetes Neg Hx   . Heart disease Neg Hx   . Hypertension Neg Hx   . Stroke Neg Hx   . COPD Neg Hx     SOCIAL HISTORY:  Social History  Substance Use Topics  . Smoking status: Former Smoker    Packs/day: 0.75    Years: 30.00    Types: Cigarettes    Quit date: 01/03/2005  . Smokeless tobacco: Never Used  . Alcohol use No    ALLERGIES:  has No Known Allergies.  MEDICATIONS:  Current Outpatient Prescriptions  Medication Sig Dispense Refill  . albuterol (PROVENTIL) (2.5 MG/3ML) 0.083% nebulizer  solution Inhale 3 mLs (2.5 mg total) into the lungs every 4 (four) hours as needed for wheezing or shortness of breath. 360 mL 3  . benzonatate (TESSALON) 100 MG capsule TAKE 1 CAPSULE BY MOUTH 3 TIMES DAILY ASNEEDED COUGH 90 capsule 1  . Calcium Carb-Cholecalciferol (CALCIUM 600/VITAMIN D3 PO) Take by mouth.    . carvedilol (COREG) 6.25 MG tablet Take 6.25 mg by mouth daily.    . chlorpheniramine (ALLERGY) 4 MG tablet Take 4 mg by mouth every 4 (four) hours as needed. Reported on 01/02/2015    . ferrous gluconate (FERATE) 240 (27 FE) MG tablet Take 240 mg by mouth 3 (three) times daily with meals.    . gabapentin (NEURONTIN) 100 MG capsule Take 1 capsule by mouth two times daily 180 capsule 1  . GNP COUGH DM ER 30 MG/5ML liquid TAKE 1 TEASPOONFUL EVERY 6 HOURS AS NEEDED FOR COUGH 150 mL 2  . guaiFENesin-codeine (ROBITUSSIN AC) 100-10 MG/5ML syrup Take 5 mLs by mouth 3 (three) times daily as needed for cough. 120 mL 0  . isosorbide mononitrate (IMDUR) 30 MG 24 hr tablet Take 30 mg by mouth daily.    Marland Kitchen levothyroxine (SYNTHROID, LEVOTHROID) 50 MCG tablet TAKE 1 TABLET BY MOUTH  DAILY FOR 5 DAYS EACH WEEK  AND TAKE ONE-HALF TABLET BY MOUTH DAILY FOR 2 DAYS EACH WEEK 78 tablet 1  . megestrol (MEGACE) 40 MG/ML suspension TAKE 15 MLS BY MOUTH DAILY 240 mL 1  . Multiple Vitamin (MULTIVITAMIN WITH MINERALS) TABS tablet Take 1 tablet by mouth daily.    . pantoprazole (PROTONIX) 40 MG tablet Take 40 mg by mouth daily.    . potassium chloride SA (K-DUR,KLOR-CON) 20 MEQ tablet Take 20 mEq by mouth daily.     Marland Kitchen torsemide (DEMADEX) 10 MG tablet Take 10 mg by mouth daily. Take Monday, Wednesday and Friday     No current facility-administered medications for this visit.    Facility-Administered Medications Ordered in Other Visits  Medication Dose Route Frequency Provider Last Rate Last Dose  . 0.9 %  sodium chloride infusion   Intravenous Continuous Leia Alf, MD 20 mL/hr at 07/04/14 1150    . 0.9 %   sodium chloride infusion  250 mL Intravenous Once Cammie Sickle, MD      . heparin lock flush 100 unit/mL  500 Units Intravenous Once Leia Alf, MD      . heparin lock flush 100 unit/mL  500 Units Intravenous Once Leia Alf, MD      . heparin lock flush 100 unit/mL  250 Units Intracatheter PRN Cammie Sickle, MD      . sodium chloride 0.9 % injection 10 mL  10 mL Intravenous PRN Leia Alf, MD      . sodium chloride 0.9 % injection 10 mL  10 mL Intravenous PRN Leia Alf, MD   10 mL at 07/04/14 1145  . sodium chloride flush (NS) 0.9 % injection 3 mL  3 mL Intracatheter PRN Cammie Sickle, MD        PHYSICAL EXAMINATION:  ECOG PERFORMANCE STATUS: 3 - Symptomatic, >50% confined to bed  BP 100/63 (BP Location: Left Arm, Patient Position: Sitting)   Pulse 97   Temp 97.5 F (36.4 C) (Tympanic)   Wt 160 lb 4 oz (72.7 kg)   BMI 34.68 kg/m   Filed Weights   01/06/16 0946  Weight: 160 lb 4 oz (72.7 kg)    GENERAL:  Alert, no distress and comfortable. Thin built/moderately nourished  In a wheel chair;  She is accompanied by husband.  EYES: positive for pallor OROPHARYNX: no thrush or ulceration; poor dentition  NECK: supple, no masses felt LYMPH:  no palpable lymphadenopathy in the cervical, axillary or inguinal regions LUNGS: Decreased breath sounds bilaterally at the bases. Scattered wheeze noted or crackles HEART/CVS: regular rate & rhythm and no murmurs; No lower extremity edema ABDOMEN:abdomen soft, non-tender and normal bowel sounds Musculoskeletal:no cyanosis of digits and no clubbing  PSYCH: alert & oriented x 3 with fluent speech NEURO: no focal motor/sensory deficits SKIN:  no rashes or significant lesions;  LABORATORY DATA:  I have reviewed the data as listed    Component Value Date/Time   NA 135 01/06/2016 0905   NA 136 08/19/2014 1337   NA 135 (L) 02/08/2014 0844   K 4.2 01/06/2016 0905   K 4.0 02/08/2014 0844   CL 108 01/06/2016  0905   CL 105 02/08/2014 0844   CO2 23 01/06/2016 0905   CO2 21 02/08/2014 0844   GLUCOSE 220 (H) 01/06/2016 0905   GLUCOSE 157 (H) 02/08/2014 0844   BUN 15 01/06/2016 0905   BUN 32 (H) 08/19/2014 1337   BUN 15 02/08/2014 0844   CREATININE 1.49 (H) 01/06/2016 0905   CREATININE 1.36 (H) 02/08/2014 0844   CALCIUM 8.3 (L) 01/06/2016 0905   CALCIUM 8.8 02/08/2014 0844   PROT 7.7 01/06/2016 0905   PROT 7.9 12/06/2013 1453   ALBUMIN 2.6 (L) 01/06/2016 0905   ALBUMIN 2.8 (L) 12/06/2013 1453   AST 14 (L) 01/06/2016 0905   AST 16 12/06/2013 1453   ALT 5 (L) 01/06/2016 0905   ALT 12 (L) 12/06/2013 1453   ALKPHOS 77 01/06/2016 0905   ALKPHOS 82 12/06/2013 1453   BILITOT 0.6 01/06/2016 0905   BILITOT 0.3 12/06/2013 1453   GFRNONAA 34 (L) 01/06/2016 0905   GFRNONAA 41 (L) 02/08/2014 0844   GFRNONAA 32 (L) 07/30/2013 1508   GFRAA 39 (L) 01/06/2016 0905   GFRAA 49 (L) 02/08/2014 0844   GFRAA 37 (L) 07/30/2013 1508    No results found for: SPEP, UPEP  Lab Results  Component Value Date   WBC 5.0 01/06/2016   NEUTROABS 3.8 01/06/2016   HGB 7.7 (L) 01/06/2016   HCT 24.2 (L) 01/06/2016   MCV 82.7 01/06/2016   PLT 261 01/06/2016      Chemistry      Component Value Date/Time   NA 135 01/06/2016 0905   NA 136 08/19/2014 1337   NA 135 (L) 02/08/2014 0844   K 4.2 01/06/2016 0905   K 4.0 02/08/2014 0844   CL 108 01/06/2016 0905   CL 105 02/08/2014 0844   CO2 23 01/06/2016 0905   CO2 21 02/08/2014 0844   BUN 15 01/06/2016 0905   BUN 32 (H) 08/19/2014 1337   BUN 15 02/08/2014 0844   CREATININE 1.49 (H) 01/06/2016 0905   CREATININE 1.36 (H) 02/08/2014 0844      Component Value Date/Time   CALCIUM 8.3 (L) 01/06/2016 0905   CALCIUM 8.8 02/08/2014 0844  ALKPHOS 77 01/06/2016 0905   ALKPHOS 82 12/06/2013 1453   AST 14 (L) 01/06/2016 0905   AST 16 12/06/2013 1453   ALT 5 (L) 01/06/2016 0905   ALT 12 (L) 12/06/2013 1453   BILITOT 0.6 01/06/2016 0905   BILITOT 0.3 12/06/2013  1453        ASSESSMENT & PLAN:   Cancer of upper lobe of right lung ([place]) # LUNG CA- non-small cell lung stage IV; s/p Opdivo every 2 weeks x#4; PET scan MARCH 2017-right lower lobe lesion improving in size [currently 2.2 x 2.5 from 3.5 x3cm]; and the right lung continues to show extensive radiation changes.  Patient's treatments are currently on hold because of declining performance status. .   # Clinically patient is stable; continue monitoring for now. No obvious progression of her lung cancer on a clinical basis. Continue hospice care.  # Hemoglobin is 7.7 recommend 2 units of PRBC transfusion. Continue PO iron TID. Hold tube at next visit.   # chronic kidney disease with Creatinine -1.3-1.4/stable.   # port flush- thru hospice RN.   # Patient follow-up with me in 8 weeks/labs-hold tube/no treatment.     Cammie Sickle, MD 01/06/2016 1:17 PM

## 2016-01-06 NOTE — Progress Notes (Signed)
Patient here today for follow up.  Patient has no new concerns today  

## 2016-01-06 NOTE — Telephone Encounter (Signed)
rcvd call from [facility] in infusion dept. Blood bank states pt only qualifies for 1 unit of blood given hgb only 7.7 - Dr. Gwynneth Aliment informed- order given verbally to proceed with only 1 unit of blood. Vicky, RN in infusion made aware of plan.

## 2016-01-06 NOTE — Assessment & Plan Note (Signed)
#   LUNG CA- non-small cell lung stage IV; s/p Opdivo every 2 weeks x#4; PET scan MARCH 2017-right lower lobe lesion improving in size [currently 2.2 x 2.5 from 3.5 x3cm]; and the right lung continues to show extensive radiation changes.  Patient's treatments are currently on hold because of declining performance status. .   # Clinically patient is stable; continue monitoring for now. No obvious progression of her lung cancer on a clinical basis. Continue hospice care.  # Hemoglobin is 7.7 recommend 2 units of PRBC transfusion. Continue PO iron TID. Hold tube at next visit.   # chronic kidney disease with Creatinine -1.3-1.4/stable.   # port flush- thru hospice RN.   # Patient follow-up with me in 8 weeks/labs-hold tube/no treatment.

## 2016-01-07 DIAGNOSIS — E039 Hypothyroidism, unspecified: Secondary | ICD-10-CM | POA: Diagnosis not present

## 2016-01-07 DIAGNOSIS — I129 Hypertensive chronic kidney disease with stage 1 through stage 4 chronic kidney disease, or unspecified chronic kidney disease: Secondary | ICD-10-CM | POA: Diagnosis not present

## 2016-01-07 DIAGNOSIS — I4891 Unspecified atrial fibrillation: Secondary | ICD-10-CM | POA: Diagnosis not present

## 2016-01-07 DIAGNOSIS — C3411 Malignant neoplasm of upper lobe, right bronchus or lung: Secondary | ICD-10-CM | POA: Diagnosis not present

## 2016-01-07 DIAGNOSIS — N189 Chronic kidney disease, unspecified: Secondary | ICD-10-CM | POA: Diagnosis not present

## 2016-01-07 DIAGNOSIS — D649 Anemia, unspecified: Secondary | ICD-10-CM | POA: Diagnosis not present

## 2016-01-07 LAB — TYPE AND SCREEN
BLOOD PRODUCT EXPIRATION DATE: 201801112359
ISSUE DATE / TIME: 201801031214
Unit Type and Rh: 6200

## 2016-01-09 DIAGNOSIS — I4891 Unspecified atrial fibrillation: Secondary | ICD-10-CM | POA: Diagnosis not present

## 2016-01-09 DIAGNOSIS — I129 Hypertensive chronic kidney disease with stage 1 through stage 4 chronic kidney disease, or unspecified chronic kidney disease: Secondary | ICD-10-CM | POA: Diagnosis not present

## 2016-01-09 DIAGNOSIS — E039 Hypothyroidism, unspecified: Secondary | ICD-10-CM | POA: Diagnosis not present

## 2016-01-09 DIAGNOSIS — C3411 Malignant neoplasm of upper lobe, right bronchus or lung: Secondary | ICD-10-CM | POA: Diagnosis not present

## 2016-01-09 DIAGNOSIS — N189 Chronic kidney disease, unspecified: Secondary | ICD-10-CM | POA: Diagnosis not present

## 2016-01-09 DIAGNOSIS — D649 Anemia, unspecified: Secondary | ICD-10-CM | POA: Diagnosis not present

## 2016-01-12 ENCOUNTER — Encounter: Payer: Self-pay | Admitting: Family Medicine

## 2016-01-12 ENCOUNTER — Ambulatory Visit (INDEPENDENT_AMBULATORY_CARE_PROVIDER_SITE_OTHER): Payer: Medicare Other | Admitting: Family Medicine

## 2016-01-12 DIAGNOSIS — D649 Anemia, unspecified: Secondary | ICD-10-CM | POA: Diagnosis not present

## 2016-01-12 DIAGNOSIS — E038 Other specified hypothyroidism: Secondary | ICD-10-CM

## 2016-01-12 DIAGNOSIS — D508 Other iron deficiency anemias: Secondary | ICD-10-CM

## 2016-01-12 DIAGNOSIS — N189 Chronic kidney disease, unspecified: Secondary | ICD-10-CM | POA: Diagnosis not present

## 2016-01-12 DIAGNOSIS — C3490 Malignant neoplasm of unspecified part of unspecified bronchus or lung: Secondary | ICD-10-CM

## 2016-01-12 DIAGNOSIS — I10 Essential (primary) hypertension: Secondary | ICD-10-CM | POA: Diagnosis not present

## 2016-01-12 DIAGNOSIS — I4891 Unspecified atrial fibrillation: Secondary | ICD-10-CM | POA: Diagnosis not present

## 2016-01-12 DIAGNOSIS — I129 Hypertensive chronic kidney disease with stage 1 through stage 4 chronic kidney disease, or unspecified chronic kidney disease: Secondary | ICD-10-CM | POA: Diagnosis not present

## 2016-01-12 DIAGNOSIS — E039 Hypothyroidism, unspecified: Secondary | ICD-10-CM | POA: Diagnosis not present

## 2016-01-12 DIAGNOSIS — C3411 Malignant neoplasm of upper lobe, right bronchus or lung: Secondary | ICD-10-CM | POA: Diagnosis not present

## 2016-01-12 NOTE — Progress Notes (Signed)
BP 100/60   Pulse 99   Temp 98.2 F (36.8 C)   Resp 14   Wt 162 lb 4 oz (73.6 kg)   SpO2 98%   BMI 35.11 kg/m    Subjective:    Patient ID: Katrina Kramer, female    DOB: 1940/09/17, 76 y.o.   MRN: [id number]  HPI: Katrina Kramer is a 76 y.o. female  Chief Complaint  Patient presents with  . Hypothyroidism  . Hyperlipidemia   Patient is here with her husband for follow-up She has recurrent/metastatic lung cancer, and is followed at the cancer center by Dr. Rogue Bussing; she saw him on January 06, 2016 (note reviewed) She has some dyspnea on exertion, uses supplemental oxygen, and has a wheelchair and walker; her husband helps with activities of daily living She suffers from recurrent anemia and gets periodic blood transfusions; last was Jan 3rd No recurrence of boils in the groin area Taking her thyroid medicine; does not want labs done today if it can be helped Sees cardiologist for intermittent atrial fibrillation; no complaints today   Depression screen [facility] 2/9 01/12/2016 07/30/2015 04/13/2015  Decreased Interest 0 0 0  Down, Depressed, Hopeless 0 0 0  PHQ - 2 Score 0 0 0   Relevant past medical, surgical, family and social history reviewed Past Medical History:  Diagnosis Date  . Anemia   . Anemia   . Atrial fibrillation ([place])    Dr. Humphrey Rolls, cardiologist  . CKD (chronic kidney disease) stage 4, GFR 15-29 ml/min ([place]) 05/01/2015  . Hypertension   . Hypothyroidism   . Lung cancer ([place])   . Lung cancer [facility])    Past Surgical History:  Procedure Laterality Date  . COLONOSCOPY N/A 10/11/2014   Procedure: COLONOSCOPY;  Surgeon: Manya Silvas, MD;  [facility] ENDOSCOPY;  Service: Endoscopy;  Laterality: N/A;  . ESOPHAGOGASTRODUODENOSCOPY N/A 10/10/2014   Procedure: ESOPHAGOGASTRODUODENOSCOPY (EGD);  Surgeon: Manya Silvas, MD;  Location: [facility] ENDOSCOPY;  Service: Endoscopy;  Laterality: N/A;  . UPPER GASTROINTESTINAL ENDOSCOPY  02/24/14   Family History  Problem  Relation Age of Onset  . Stomach cancer Mother   . Cancer Mother     unsure where but states "it spead all over"  . Alzheimer's disease Father   . Varicose Veins Daughter   . Diabetes Neg Hx   . Heart disease Neg Hx   . Hypertension Neg Hx   . Stroke Neg Hx   . COPD Neg Hx    Social History  Substance Use Topics  . Smoking status: Former Smoker    Packs/day: 0.75    Years: 30.00    Types: Cigarettes    Quit date: 01/03/2005  . Smokeless tobacco: Never Used  . Alcohol use No   Interim medical history since last visit reviewed. Allergies and medications reviewed  Review of Systems Per HPI unless specifically indicated above     Objective:    BP 100/60   Pulse 99   Temp 98.2 F (36.8 C)   Resp 14   Wt 162 lb 4 oz (73.6 kg)   SpO2 98%   BMI 35.11 kg/m   Wt Readings from Last 3 Encounters:  01/12/16 162 lb 4 oz (73.6 kg)  01/06/16 160 lb 4 oz (72.7 kg)  11/04/15 145 lb (65.8 kg)    Physical Exam  Constitutional: No distress.  Elderly female seated in wheelchair, gait not assessed; appears chronically ill but nontoxic  HENT:  Head: Normocephalic and atraumatic.  Eyes: EOM are normal. No scleral icterus.  Neck: No JVD present. No thyromegaly present.  Cardiovascular: Normal rate.   Pulmonary/Chest: Effort normal. No accessory muscle usage. No respiratory distress. She has decreased breath sounds.  Abdominal: She exhibits no distension.  Musculoskeletal: She exhibits no edema.  Neurological: She is alert.  Skin: Skin is warm. She is not diaphoretic. No pallor.  Psychiatric: She has a normal mood and affect. Her mood appears not anxious. She does not exhibit a depressed mood.  Good eye contact with examiner      Assessment & Plan:   Problem List Items Addressed This Visit      Cardiovascular and Mediastinum   Hypertension    Excellent control today        Respiratory   Non-small cell carcinoma of lung (HCC)    Recurrent/metastatic; managed by heme-onc          Endocrine   Hypothyroidism    Patient did not wish to have more blood drawn today, as she was just at the cancer center last week; I'll enter future TSH orders to be done next time patient has labs done      Relevant Orders   TSH     Other   Anemia    Followed by heme-onc, recent transfusion         Follow up plan: Return in about 4 months (around 05/11/2016) for check-in and follow-up.  An after-visit summary was printed and given to the patient at [place].  Please see the patient instructions which may contain other information and recommendations beyond what is mentioned above in the assessment and plan.  No orders of the defined types were placed in this encounter.   Orders Placed This Encounter  Procedures  . TSH

## 2016-01-13 DIAGNOSIS — N189 Chronic kidney disease, unspecified: Secondary | ICD-10-CM | POA: Diagnosis not present

## 2016-01-13 DIAGNOSIS — I4891 Unspecified atrial fibrillation: Secondary | ICD-10-CM | POA: Diagnosis not present

## 2016-01-13 DIAGNOSIS — D649 Anemia, unspecified: Secondary | ICD-10-CM | POA: Diagnosis not present

## 2016-01-13 DIAGNOSIS — E039 Hypothyroidism, unspecified: Secondary | ICD-10-CM | POA: Diagnosis not present

## 2016-01-13 DIAGNOSIS — C3411 Malignant neoplasm of upper lobe, right bronchus or lung: Secondary | ICD-10-CM | POA: Diagnosis not present

## 2016-01-13 DIAGNOSIS — I129 Hypertensive chronic kidney disease with stage 1 through stage 4 chronic kidney disease, or unspecified chronic kidney disease: Secondary | ICD-10-CM | POA: Diagnosis not present

## 2016-01-17 ENCOUNTER — Other Ambulatory Visit: Payer: Self-pay | Admitting: Family Medicine

## 2016-01-19 DIAGNOSIS — I4891 Unspecified atrial fibrillation: Secondary | ICD-10-CM | POA: Diagnosis not present

## 2016-01-19 DIAGNOSIS — I129 Hypertensive chronic kidney disease with stage 1 through stage 4 chronic kidney disease, or unspecified chronic kidney disease: Secondary | ICD-10-CM | POA: Diagnosis not present

## 2016-01-19 DIAGNOSIS — N189 Chronic kidney disease, unspecified: Secondary | ICD-10-CM | POA: Diagnosis not present

## 2016-01-19 DIAGNOSIS — C3411 Malignant neoplasm of upper lobe, right bronchus or lung: Secondary | ICD-10-CM | POA: Diagnosis not present

## 2016-01-19 DIAGNOSIS — D649 Anemia, unspecified: Secondary | ICD-10-CM | POA: Diagnosis not present

## 2016-01-19 DIAGNOSIS — E039 Hypothyroidism, unspecified: Secondary | ICD-10-CM | POA: Diagnosis not present

## 2016-01-20 NOTE — Telephone Encounter (Signed)
rx approved

## 2016-01-21 NOTE — Assessment & Plan Note (Signed)
Recurrent/metastatic; managed by heme-onc

## 2016-01-21 NOTE — Assessment & Plan Note (Signed)
Patient did not wish to have more blood drawn today, as she was just at the cancer center last week; I'll enter future TSH orders to be done next time patient has labs done

## 2016-01-21 NOTE — Assessment & Plan Note (Signed)
Excellent control today 

## 2016-01-21 NOTE — Assessment & Plan Note (Signed)
Followed by heme-onc, recent transfusion

## 2016-01-22 ENCOUNTER — Other Ambulatory Visit: Payer: Self-pay | Admitting: Internal Medicine

## 2016-01-22 DIAGNOSIS — C3411 Malignant neoplasm of upper lobe, right bronchus or lung: Secondary | ICD-10-CM | POA: Diagnosis not present

## 2016-01-22 DIAGNOSIS — I4891 Unspecified atrial fibrillation: Secondary | ICD-10-CM | POA: Diagnosis not present

## 2016-01-22 DIAGNOSIS — N189 Chronic kidney disease, unspecified: Secondary | ICD-10-CM | POA: Diagnosis not present

## 2016-01-22 DIAGNOSIS — E039 Hypothyroidism, unspecified: Secondary | ICD-10-CM | POA: Diagnosis not present

## 2016-01-22 DIAGNOSIS — D649 Anemia, unspecified: Secondary | ICD-10-CM | POA: Diagnosis not present

## 2016-01-22 DIAGNOSIS — D509 Iron deficiency anemia, unspecified: Secondary | ICD-10-CM

## 2016-01-22 DIAGNOSIS — I129 Hypertensive chronic kidney disease with stage 1 through stage 4 chronic kidney disease, or unspecified chronic kidney disease: Secondary | ICD-10-CM | POA: Diagnosis not present

## 2016-01-29 ENCOUNTER — Other Ambulatory Visit: Payer: Self-pay | Admitting: Internal Medicine

## 2016-01-29 DIAGNOSIS — I129 Hypertensive chronic kidney disease with stage 1 through stage 4 chronic kidney disease, or unspecified chronic kidney disease: Secondary | ICD-10-CM | POA: Diagnosis not present

## 2016-01-29 DIAGNOSIS — I4891 Unspecified atrial fibrillation: Secondary | ICD-10-CM | POA: Diagnosis not present

## 2016-01-29 DIAGNOSIS — C3411 Malignant neoplasm of upper lobe, right bronchus or lung: Secondary | ICD-10-CM | POA: Diagnosis not present

## 2016-01-29 DIAGNOSIS — E039 Hypothyroidism, unspecified: Secondary | ICD-10-CM | POA: Diagnosis not present

## 2016-01-29 DIAGNOSIS — D649 Anemia, unspecified: Secondary | ICD-10-CM | POA: Diagnosis not present

## 2016-01-29 DIAGNOSIS — N189 Chronic kidney disease, unspecified: Secondary | ICD-10-CM | POA: Diagnosis not present

## 2016-02-04 ENCOUNTER — Other Ambulatory Visit: Payer: Self-pay | Admitting: *Deleted

## 2016-02-04 DIAGNOSIS — I4891 Unspecified atrial fibrillation: Secondary | ICD-10-CM | POA: Diagnosis not present

## 2016-02-04 DIAGNOSIS — D649 Anemia, unspecified: Secondary | ICD-10-CM | POA: Diagnosis not present

## 2016-02-04 DIAGNOSIS — R05 Cough: Secondary | ICD-10-CM

## 2016-02-04 DIAGNOSIS — R059 Cough, unspecified: Secondary | ICD-10-CM

## 2016-02-04 DIAGNOSIS — C3411 Malignant neoplasm of upper lobe, right bronchus or lung: Secondary | ICD-10-CM | POA: Diagnosis not present

## 2016-02-04 DIAGNOSIS — I129 Hypertensive chronic kidney disease with stage 1 through stage 4 chronic kidney disease, or unspecified chronic kidney disease: Secondary | ICD-10-CM | POA: Diagnosis not present

## 2016-02-04 DIAGNOSIS — N189 Chronic kidney disease, unspecified: Secondary | ICD-10-CM | POA: Diagnosis not present

## 2016-02-04 DIAGNOSIS — E039 Hypothyroidism, unspecified: Secondary | ICD-10-CM | POA: Diagnosis not present

## 2016-02-04 DIAGNOSIS — Z87891 Personal history of nicotine dependence: Secondary | ICD-10-CM | POA: Diagnosis not present

## 2016-02-04 MED ORDER — GUAIFENESIN-CODEINE 100-10 MG/5ML PO SYRP: ORAL_SOLUTION | ORAL | 0 refills | Status: DC

## 2016-02-05 DIAGNOSIS — N189 Chronic kidney disease, unspecified: Secondary | ICD-10-CM | POA: Diagnosis not present

## 2016-02-05 DIAGNOSIS — C3411 Malignant neoplasm of upper lobe, right bronchus or lung: Secondary | ICD-10-CM | POA: Diagnosis not present

## 2016-02-05 DIAGNOSIS — D649 Anemia, unspecified: Secondary | ICD-10-CM | POA: Diagnosis not present

## 2016-02-05 DIAGNOSIS — E039 Hypothyroidism, unspecified: Secondary | ICD-10-CM | POA: Diagnosis not present

## 2016-02-05 DIAGNOSIS — I4891 Unspecified atrial fibrillation: Secondary | ICD-10-CM | POA: Diagnosis not present

## 2016-02-05 DIAGNOSIS — I129 Hypertensive chronic kidney disease with stage 1 through stage 4 chronic kidney disease, or unspecified chronic kidney disease: Secondary | ICD-10-CM | POA: Diagnosis not present

## 2016-02-10 DIAGNOSIS — I4891 Unspecified atrial fibrillation: Secondary | ICD-10-CM | POA: Diagnosis not present

## 2016-02-10 DIAGNOSIS — D649 Anemia, unspecified: Secondary | ICD-10-CM | POA: Diagnosis not present

## 2016-02-10 DIAGNOSIS — C3411 Malignant neoplasm of upper lobe, right bronchus or lung: Secondary | ICD-10-CM | POA: Diagnosis not present

## 2016-02-10 DIAGNOSIS — E039 Hypothyroidism, unspecified: Secondary | ICD-10-CM | POA: Diagnosis not present

## 2016-02-10 DIAGNOSIS — I129 Hypertensive chronic kidney disease with stage 1 through stage 4 chronic kidney disease, or unspecified chronic kidney disease: Secondary | ICD-10-CM | POA: Diagnosis not present

## 2016-02-10 DIAGNOSIS — N189 Chronic kidney disease, unspecified: Secondary | ICD-10-CM | POA: Diagnosis not present

## 2016-02-17 DIAGNOSIS — E039 Hypothyroidism, unspecified: Secondary | ICD-10-CM | POA: Diagnosis not present

## 2016-02-17 DIAGNOSIS — I4891 Unspecified atrial fibrillation: Secondary | ICD-10-CM | POA: Diagnosis not present

## 2016-02-17 DIAGNOSIS — N189 Chronic kidney disease, unspecified: Secondary | ICD-10-CM | POA: Diagnosis not present

## 2016-02-17 DIAGNOSIS — D649 Anemia, unspecified: Secondary | ICD-10-CM | POA: Diagnosis not present

## 2016-02-17 DIAGNOSIS — C3411 Malignant neoplasm of upper lobe, right bronchus or lung: Secondary | ICD-10-CM | POA: Diagnosis not present

## 2016-02-17 DIAGNOSIS — I129 Hypertensive chronic kidney disease with stage 1 through stage 4 chronic kidney disease, or unspecified chronic kidney disease: Secondary | ICD-10-CM | POA: Diagnosis not present

## 2016-02-19 IMAGING — CR DG CHEST 1V PORT
1 series · 2 of 2 positions shown · non-contrast
Comparison: Prior chest x-ray 02/05/2014.

CLINICAL DATA: 74-year-old female with shortness of breath and
symptoms of heart racing. Clinical history includes lung cancer.

EXAM:
PORTABLE CHEST - 1 VIEW

[Series 1: ap · 0.17mm/px · 2 of 2 slices shown]
[im 1/2]
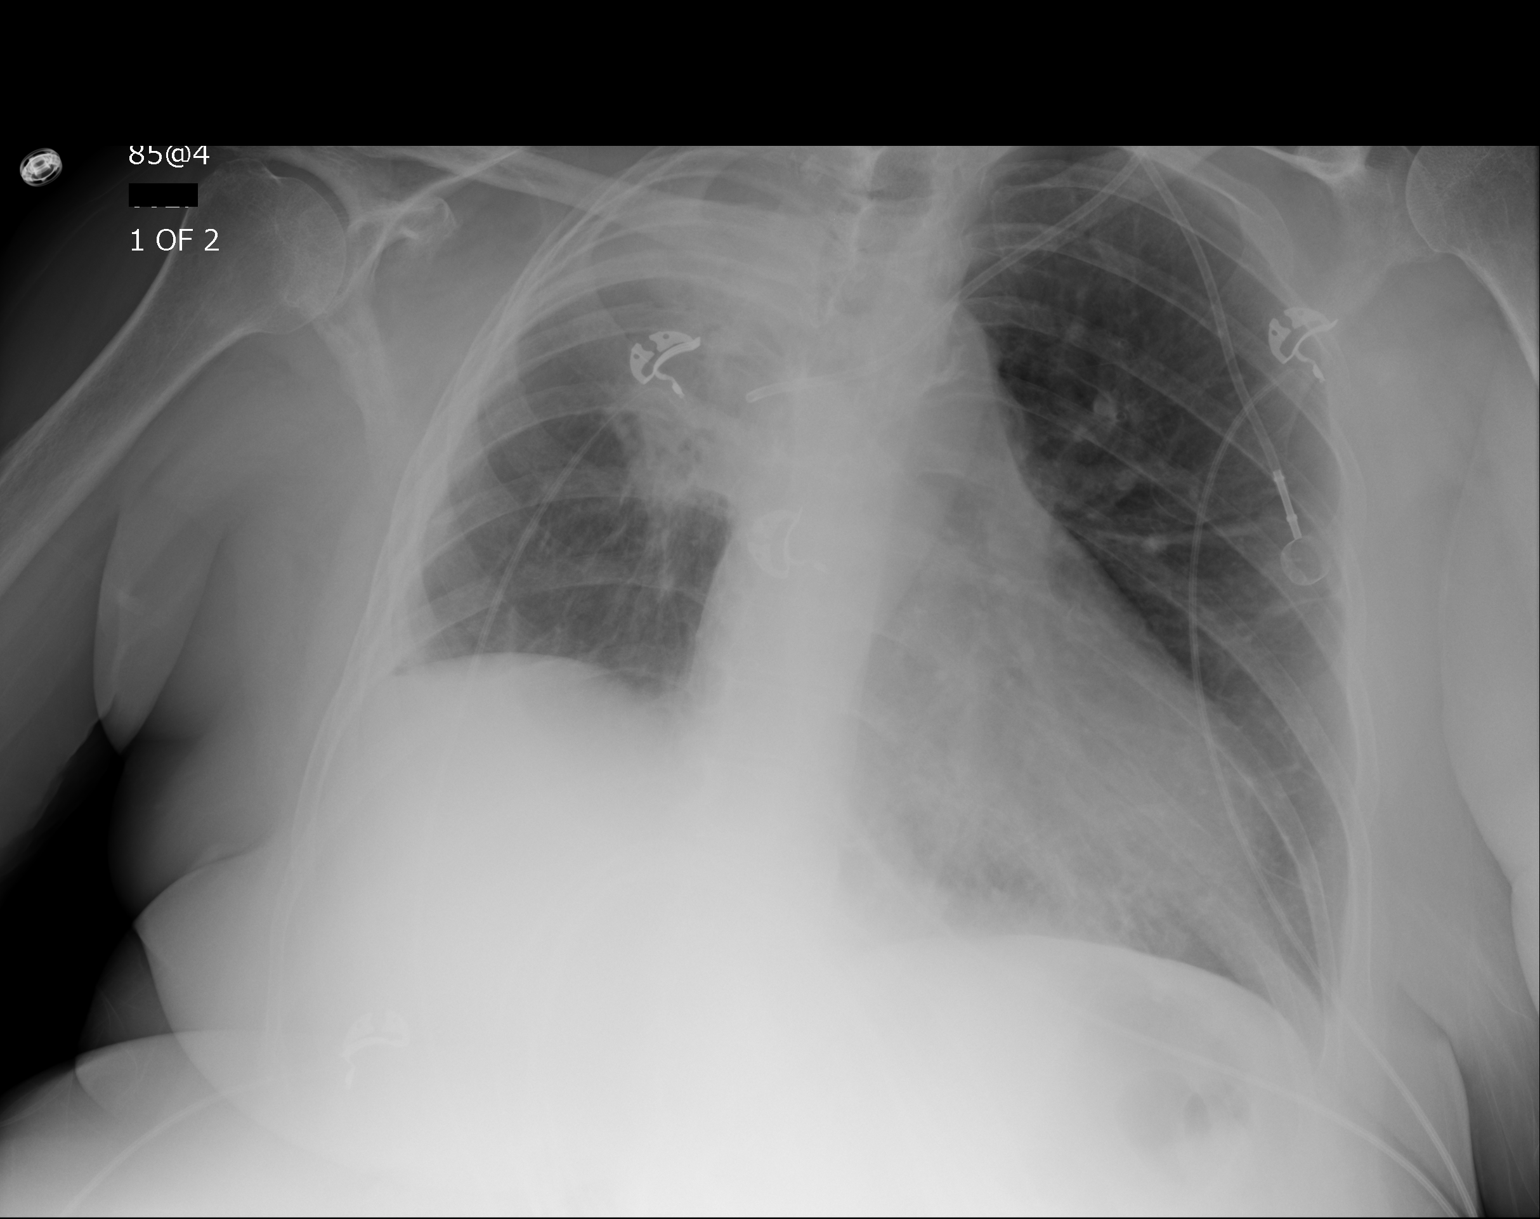
[im 2/2]
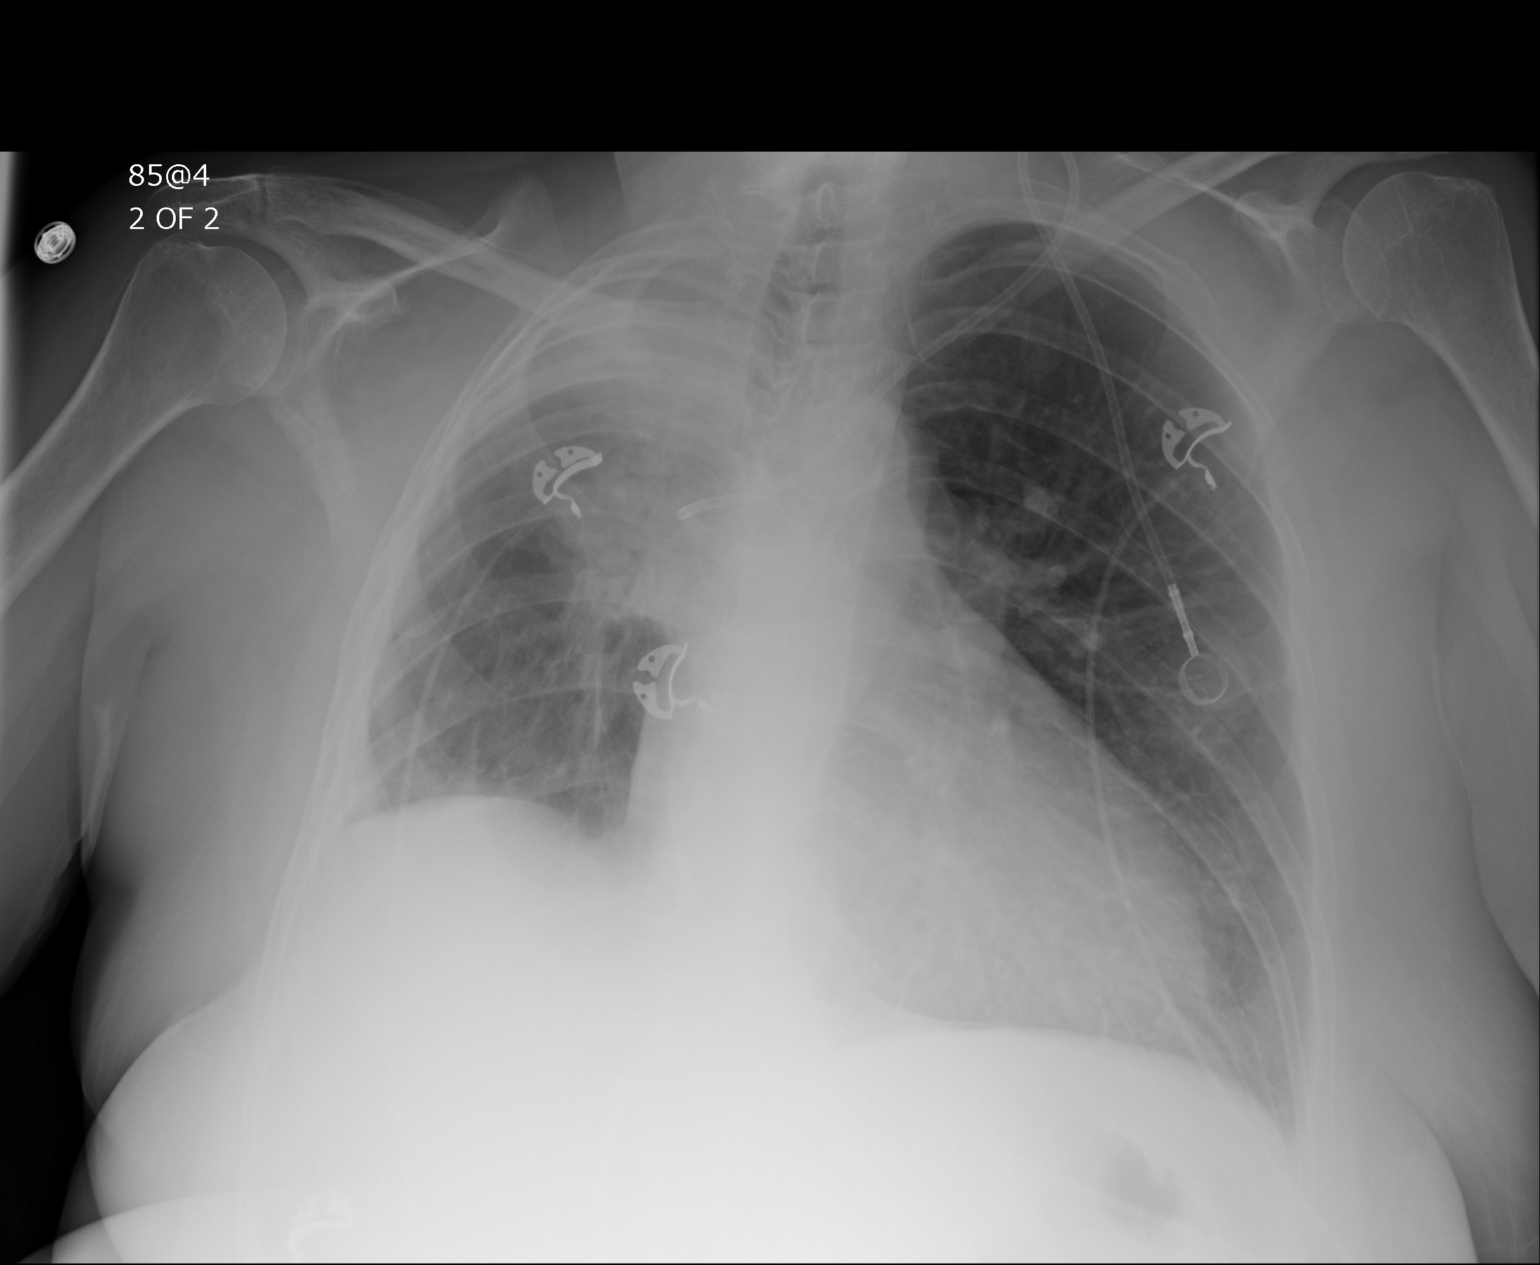

[2 of 2 positions shown; findings below may reference images not displayed]

FINDINGS: Left IJ approach single-lumen power injectable port catheter remains
in unchanged position. The catheter loops is a enters the internal
jugular vein. The tip of the catheter is within the upper SVC
directed toward the azygos vein. Stable mild cardiomegaly with
prominence the left heart. Atherosclerotic calcification present in
the transverse aorta.

Stable volume loss and pleural parenchymal scarring in the right
upper lobe suggesting prior radiation therapy. New blunting of the
right costophrenic angle suggests either a small effusion or
subsegmental atelectasis. No pulmonary edema. No evidence of
pneumothorax. No acute osseous abnormality.
IMPRESSION: 1. Trace right pleural effusion versus increased subsegmental
atelectasis.
2. Borderline cardiomegaly with left heart prominence.
3. Unchanged position of left IJ approach single-lumen power
injectable portacatheter. The catheter tip is in the upper SVC
directed toward the azygos vein.

## 2016-02-21 ENCOUNTER — Other Ambulatory Visit: Payer: Self-pay | Admitting: Internal Medicine

## 2016-02-21 DIAGNOSIS — D649 Anemia, unspecified: Secondary | ICD-10-CM | POA: Diagnosis not present

## 2016-02-21 DIAGNOSIS — C3411 Malignant neoplasm of upper lobe, right bronchus or lung: Secondary | ICD-10-CM | POA: Diagnosis not present

## 2016-02-21 DIAGNOSIS — R059 Cough, unspecified: Secondary | ICD-10-CM

## 2016-02-21 DIAGNOSIS — I4891 Unspecified atrial fibrillation: Secondary | ICD-10-CM | POA: Diagnosis not present

## 2016-02-21 DIAGNOSIS — I129 Hypertensive chronic kidney disease with stage 1 through stage 4 chronic kidney disease, or unspecified chronic kidney disease: Secondary | ICD-10-CM | POA: Diagnosis not present

## 2016-02-21 DIAGNOSIS — E039 Hypothyroidism, unspecified: Secondary | ICD-10-CM | POA: Diagnosis not present

## 2016-02-21 DIAGNOSIS — R05 Cough: Secondary | ICD-10-CM

## 2016-02-21 DIAGNOSIS — N189 Chronic kidney disease, unspecified: Secondary | ICD-10-CM | POA: Diagnosis not present

## 2016-02-22 DIAGNOSIS — E039 Hypothyroidism, unspecified: Secondary | ICD-10-CM | POA: Diagnosis not present

## 2016-02-22 DIAGNOSIS — C3411 Malignant neoplasm of upper lobe, right bronchus or lung: Secondary | ICD-10-CM | POA: Diagnosis not present

## 2016-02-22 DIAGNOSIS — I4891 Unspecified atrial fibrillation: Secondary | ICD-10-CM | POA: Diagnosis not present

## 2016-02-22 DIAGNOSIS — N189 Chronic kidney disease, unspecified: Secondary | ICD-10-CM | POA: Diagnosis not present

## 2016-02-22 DIAGNOSIS — D649 Anemia, unspecified: Secondary | ICD-10-CM | POA: Diagnosis not present

## 2016-02-22 DIAGNOSIS — I129 Hypertensive chronic kidney disease with stage 1 through stage 4 chronic kidney disease, or unspecified chronic kidney disease: Secondary | ICD-10-CM | POA: Diagnosis not present

## 2016-02-23 DIAGNOSIS — D649 Anemia, unspecified: Secondary | ICD-10-CM | POA: Diagnosis not present

## 2016-02-23 DIAGNOSIS — E039 Hypothyroidism, unspecified: Secondary | ICD-10-CM | POA: Diagnosis not present

## 2016-02-23 DIAGNOSIS — N189 Chronic kidney disease, unspecified: Secondary | ICD-10-CM | POA: Diagnosis not present

## 2016-02-23 DIAGNOSIS — I129 Hypertensive chronic kidney disease with stage 1 through stage 4 chronic kidney disease, or unspecified chronic kidney disease: Secondary | ICD-10-CM | POA: Diagnosis not present

## 2016-02-23 DIAGNOSIS — C3411 Malignant neoplasm of upper lobe, right bronchus or lung: Secondary | ICD-10-CM | POA: Diagnosis not present

## 2016-02-23 DIAGNOSIS — I4891 Unspecified atrial fibrillation: Secondary | ICD-10-CM | POA: Diagnosis not present

## 2016-02-24 DIAGNOSIS — C3411 Malignant neoplasm of upper lobe, right bronchus or lung: Secondary | ICD-10-CM | POA: Diagnosis not present

## 2016-02-24 DIAGNOSIS — E039 Hypothyroidism, unspecified: Secondary | ICD-10-CM | POA: Diagnosis not present

## 2016-02-24 DIAGNOSIS — I129 Hypertensive chronic kidney disease with stage 1 through stage 4 chronic kidney disease, or unspecified chronic kidney disease: Secondary | ICD-10-CM | POA: Diagnosis not present

## 2016-02-24 DIAGNOSIS — I4891 Unspecified atrial fibrillation: Secondary | ICD-10-CM | POA: Diagnosis not present

## 2016-02-24 DIAGNOSIS — N189 Chronic kidney disease, unspecified: Secondary | ICD-10-CM | POA: Diagnosis not present

## 2016-02-24 DIAGNOSIS — D649 Anemia, unspecified: Secondary | ICD-10-CM | POA: Diagnosis not present

## 2016-02-26 ENCOUNTER — Other Ambulatory Visit: Payer: Self-pay | Admitting: Internal Medicine

## 2016-02-29 DIAGNOSIS — E039 Hypothyroidism, unspecified: Secondary | ICD-10-CM | POA: Diagnosis not present

## 2016-02-29 DIAGNOSIS — C3411 Malignant neoplasm of upper lobe, right bronchus or lung: Secondary | ICD-10-CM | POA: Diagnosis not present

## 2016-02-29 DIAGNOSIS — D649 Anemia, unspecified: Secondary | ICD-10-CM | POA: Diagnosis not present

## 2016-02-29 DIAGNOSIS — I129 Hypertensive chronic kidney disease with stage 1 through stage 4 chronic kidney disease, or unspecified chronic kidney disease: Secondary | ICD-10-CM | POA: Diagnosis not present

## 2016-02-29 DIAGNOSIS — N189 Chronic kidney disease, unspecified: Secondary | ICD-10-CM | POA: Diagnosis not present

## 2016-02-29 DIAGNOSIS — I4891 Unspecified atrial fibrillation: Secondary | ICD-10-CM | POA: Diagnosis not present

## 2016-03-02 ENCOUNTER — Other Ambulatory Visit: Payer: Self-pay | Admitting: *Deleted

## 2016-03-02 ENCOUNTER — Inpatient Hospital Stay: Attending: Internal Medicine

## 2016-03-02 ENCOUNTER — Other Ambulatory Visit: Payer: Self-pay | Admitting: Internal Medicine

## 2016-03-02 ENCOUNTER — Telehealth: Payer: Self-pay | Admitting: *Deleted

## 2016-03-02 DIAGNOSIS — N183 Chronic kidney disease, stage 3 (moderate): Secondary | ICD-10-CM | POA: Diagnosis not present

## 2016-03-02 DIAGNOSIS — D631 Anemia in chronic kidney disease: Secondary | ICD-10-CM | POA: Diagnosis not present

## 2016-03-02 DIAGNOSIS — I4891 Unspecified atrial fibrillation: Secondary | ICD-10-CM | POA: Diagnosis not present

## 2016-03-02 DIAGNOSIS — Z79899 Other long term (current) drug therapy: Secondary | ICD-10-CM | POA: Insufficient documentation

## 2016-03-02 DIAGNOSIS — D630 Anemia in neoplastic disease: Secondary | ICD-10-CM

## 2016-03-02 DIAGNOSIS — C3490 Malignant neoplasm of unspecified part of unspecified bronchus or lung: Secondary | ICD-10-CM

## 2016-03-02 DIAGNOSIS — N184 Chronic kidney disease, stage 4 (severe): Secondary | ICD-10-CM | POA: Diagnosis not present

## 2016-03-02 DIAGNOSIS — E039 Hypothyroidism, unspecified: Secondary | ICD-10-CM | POA: Diagnosis not present

## 2016-03-02 DIAGNOSIS — C3411 Malignant neoplasm of upper lobe, right bronchus or lung: Secondary | ICD-10-CM | POA: Diagnosis not present

## 2016-03-02 DIAGNOSIS — I129 Hypertensive chronic kidney disease with stage 1 through stage 4 chronic kidney disease, or unspecified chronic kidney disease: Secondary | ICD-10-CM | POA: Insufficient documentation

## 2016-03-02 DIAGNOSIS — Z87891 Personal history of nicotine dependence: Secondary | ICD-10-CM | POA: Insufficient documentation

## 2016-03-02 DIAGNOSIS — Z9221 Personal history of antineoplastic chemotherapy: Secondary | ICD-10-CM | POA: Insufficient documentation

## 2016-03-02 DIAGNOSIS — R609 Edema, unspecified: Secondary | ICD-10-CM | POA: Diagnosis not present

## 2016-03-02 DIAGNOSIS — E871 Hypo-osmolality and hyponatremia: Secondary | ICD-10-CM | POA: Diagnosis not present

## 2016-03-02 DIAGNOSIS — D500 Iron deficiency anemia secondary to blood loss (chronic): Secondary | ICD-10-CM

## 2016-03-02 LAB — CBC WITH DIFFERENTIAL/PLATELET
BASOS ABS: 0 10*3/uL (ref 0–0.1)
Basophils Relative: 1 %
EOS ABS: 0.1 10*3/uL (ref 0–0.7)
EOS PCT: 1 %
HCT: 23.2 % — ABNORMAL LOW (ref 35.0–47.0)
Hemoglobin: 7.5 g/dL — ABNORMAL LOW (ref 12.0–16.0)
LYMPHS PCT: 10 %
Lymphs Abs: 0.5 10*3/uL — ABNORMAL LOW (ref 1.0–3.6)
MCH: 27.6 pg (ref 26.0–34.0)
MCHC: 32.4 g/dL (ref 32.0–36.0)
MCV: 85.3 fL (ref 80.0–100.0)
Monocytes Absolute: 0.6 10*3/uL (ref 0.2–0.9)
Monocytes Relative: 12 %
Neutro Abs: 3.7 10*3/uL (ref 1.4–6.5)
Neutrophils Relative %: 76 %
PLATELETS: 245 10*3/uL (ref 150–440)
RBC: 2.73 MIL/uL — AB (ref 3.80–5.20)
RDW: 18.6 % — ABNORMAL HIGH (ref 11.5–14.5)
WBC: 4.8 10*3/uL (ref 3.6–11.0)

## 2016-03-02 LAB — BASIC METABOLIC PANEL
ANION GAP: 7 (ref 5–15)
BUN: 20 mg/dL (ref 6–20)
CO2: 25 mmol/L (ref 22–32)
Calcium: 8 mg/dL — ABNORMAL LOW (ref 8.9–10.3)
Chloride: 98 mmol/L — ABNORMAL LOW (ref 101–111)
Creatinine, Ser: 1.69 mg/dL — ABNORMAL HIGH (ref 0.44–1.00)
GFR, EST AFRICAN AMERICAN: 33 mL/min — AB (ref >60)
GFR, EST NON AFRICAN AMERICAN: 29 mL/min — AB (ref >60)
Glucose, Bld: 173 mg/dL — ABNORMAL HIGH (ref 65–99)
POTASSIUM: 3.9 mmol/L (ref 3.5–5.1)
SODIUM: 130 mmol/L — AB (ref 135–145)

## 2016-03-02 LAB — SAMPLE TO BLOOD BANK

## 2016-03-02 LAB — PREPARE RBC (CROSSMATCH)

## 2016-03-02 MED ORDER — ACETAMINOPHEN 325 MG PO TABS: 650.0000 mg | ORAL_TABLET | Freq: Once | ORAL | Status: DC

## 2016-03-02 MED ORDER — SODIUM CHLORIDE 0.9 % IV SOLN
250.0000 mL | Freq: Once | INTRAVENOUS | Status: DC
  Filled 2016-03-02: qty 250

## 2016-03-02 MED ORDER — SODIUM CHLORIDE 0.9% FLUSH
3.0000 mL | INTRAVENOUS | Status: DC | PRN
  Filled 2016-03-02: qty 3

## 2016-03-02 MED ORDER — FUROSEMIDE 10 MG/ML IJ SOLN: 20.0000 mg | Freq: Once | INTRAMUSCULAR | Status: DC

## 2016-03-02 MED ORDER — HEPARIN SOD (PORK) LOCK FLUSH 100 UNIT/ML IV SOLN: 250.0000 [IU] | INTRAVENOUS | Status: DC | PRN

## 2016-03-02 MED ORDER — SODIUM CHLORIDE 0.9% FLUSH
10.0000 mL | INTRAVENOUS | Status: DC | PRN
  Filled 2016-03-02: qty 10

## 2016-03-02 MED ORDER — DIPHENHYDRAMINE HCL 25 MG PO CAPS: 25.0000 mg | ORAL_CAPSULE | Freq: Once | ORAL | Status: DC

## 2016-03-02 MED ORDER — HEPARIN SOD (PORK) LOCK FLUSH 100 UNIT/ML IV SOLN: 500.0000 [IU] | Freq: Every day | INTRAVENOUS | Status: DC | PRN

## 2016-03-02 NOTE — Telephone Encounter (Addendum)
Per pt's husband - Patient c/o shortness of breath and feels like she needs a blood transfusion sooner.  Spoke with Dr. Bradd Canary- ok to have pt be a lab only today-draw labs cbc, metb, hold tube; hold chair for possible 2 units of blood. Patient can only come at or around 11 am. Unable to stay for blood transfusion today due to another apt with renal doctor today. Patient states that she will wait for labs to result.  Will hold chair available on 3/1 for possible blood transfusion. Husband gave verbal understanding of plan of care.

## 2016-03-03 ENCOUNTER — Inpatient Hospital Stay: Attending: Internal Medicine

## 2016-03-03 DIAGNOSIS — Z79899 Other long term (current) drug therapy: Secondary | ICD-10-CM | POA: Diagnosis not present

## 2016-03-03 DIAGNOSIS — D631 Anemia in chronic kidney disease: Secondary | ICD-10-CM | POA: Insufficient documentation

## 2016-03-03 DIAGNOSIS — I4891 Unspecified atrial fibrillation: Secondary | ICD-10-CM | POA: Insufficient documentation

## 2016-03-03 DIAGNOSIS — C3411 Malignant neoplasm of upper lobe, right bronchus or lung: Secondary | ICD-10-CM | POA: Diagnosis not present

## 2016-03-03 DIAGNOSIS — Z9221 Personal history of antineoplastic chemotherapy: Secondary | ICD-10-CM | POA: Diagnosis not present

## 2016-03-03 DIAGNOSIS — D630 Anemia in neoplastic disease: Secondary | ICD-10-CM

## 2016-03-03 DIAGNOSIS — R600 Localized edema: Secondary | ICD-10-CM | POA: Diagnosis not present

## 2016-03-03 DIAGNOSIS — E039 Hypothyroidism, unspecified: Secondary | ICD-10-CM | POA: Diagnosis not present

## 2016-03-03 DIAGNOSIS — Z9981 Dependence on supplemental oxygen: Secondary | ICD-10-CM | POA: Diagnosis not present

## 2016-03-03 DIAGNOSIS — N184 Chronic kidney disease, stage 4 (severe): Secondary | ICD-10-CM | POA: Diagnosis not present

## 2016-03-03 DIAGNOSIS — I129 Hypertensive chronic kidney disease with stage 1 through stage 4 chronic kidney disease, or unspecified chronic kidney disease: Secondary | ICD-10-CM | POA: Diagnosis not present

## 2016-03-03 DIAGNOSIS — N189 Chronic kidney disease, unspecified: Secondary | ICD-10-CM | POA: Diagnosis not present

## 2016-03-03 DIAGNOSIS — D649 Anemia, unspecified: Secondary | ICD-10-CM | POA: Diagnosis not present

## 2016-03-03 DIAGNOSIS — Z87891 Personal history of nicotine dependence: Secondary | ICD-10-CM | POA: Diagnosis not present

## 2016-03-03 MED ORDER — SODIUM CHLORIDE 0.9 % IV SOLN
250.0000 mL | Freq: Once | INTRAVENOUS | Status: AC
  Administered 2016-03-03: 250 mL via INTRAVENOUS
  Filled 2016-03-03: qty 250

## 2016-03-03 MED ORDER — ACETAMINOPHEN 325 MG PO TABS
650.0000 mg | ORAL_TABLET | Freq: Once | ORAL | Status: AC
  Administered 2016-03-03: 650 mg via ORAL
  Filled 2016-03-03: qty 2

## 2016-03-03 MED ORDER — DIPHENHYDRAMINE HCL 25 MG PO CAPS
25.0000 mg | ORAL_CAPSULE | Freq: Once | ORAL | Status: AC
  Administered 2016-03-03: 25 mg via ORAL
  Filled 2016-03-03: qty 1

## 2016-03-03 MED ORDER — FUROSEMIDE 10 MG/ML IJ SOLN
20.0000 mg | Freq: Once | INTRAMUSCULAR | Status: AC
  Administered 2016-03-03: 20 mg via INTRAVENOUS
  Filled 2016-03-03: qty 2

## 2016-03-03 MED ORDER — HEPARIN SOD (PORK) LOCK FLUSH 100 UNIT/ML IV SOLN
500.0000 [IU] | Freq: Every day | INTRAVENOUS | Status: AC | PRN
  Administered 2016-03-03: 500 [IU]
  Filled 2016-03-03: qty 5

## 2016-03-04 LAB — BPAM RBC
Blood Product Expiration Date: 201803232359
Blood Product Expiration Date: 201803252359
ISSUE DATE / TIME: 201803011146
ISSUE DATE / TIME: 201803010943
UNIT TYPE AND RH: 6200
UNIT TYPE AND RH: 6200

## 2016-03-04 LAB — TYPE AND SCREEN
ABO/RH(D): AB POS
ANTIBODY SCREEN: NEGATIVE
UNIT DIVISION: 0
Unit division: 0

## 2016-03-06 ENCOUNTER — Other Ambulatory Visit: Payer: Self-pay | Admitting: Family Medicine

## 2016-03-07 ENCOUNTER — Ambulatory Visit: Payer: Medicare Other

## 2016-03-07 ENCOUNTER — Other Ambulatory Visit: Payer: Medicare Other

## 2016-03-07 ENCOUNTER — Ambulatory Visit: Payer: Medicare Other | Admitting: Internal Medicine

## 2016-03-08 ENCOUNTER — Encounter: Payer: Self-pay | Admitting: Family Medicine

## 2016-03-08 ENCOUNTER — Other Ambulatory Visit: Payer: Self-pay | Admitting: Internal Medicine

## 2016-03-08 DIAGNOSIS — R600 Localized edema: Secondary | ICD-10-CM | POA: Diagnosis not present

## 2016-03-08 DIAGNOSIS — C3411 Malignant neoplasm of upper lobe, right bronchus or lung: Secondary | ICD-10-CM

## 2016-03-08 DIAGNOSIS — I129 Hypertensive chronic kidney disease with stage 1 through stage 4 chronic kidney disease, or unspecified chronic kidney disease: Secondary | ICD-10-CM | POA: Diagnosis not present

## 2016-03-08 DIAGNOSIS — Z9981 Dependence on supplemental oxygen: Secondary | ICD-10-CM | POA: Diagnosis not present

## 2016-03-08 DIAGNOSIS — N189 Chronic kidney disease, unspecified: Secondary | ICD-10-CM | POA: Diagnosis not present

## 2016-03-08 DIAGNOSIS — I4891 Unspecified atrial fibrillation: Secondary | ICD-10-CM | POA: Diagnosis not present

## 2016-03-08 DIAGNOSIS — D509 Iron deficiency anemia, unspecified: Secondary | ICD-10-CM

## 2016-03-09 ENCOUNTER — Other Ambulatory Visit: Payer: Self-pay | Admitting: *Deleted

## 2016-03-09 DIAGNOSIS — R600 Localized edema: Secondary | ICD-10-CM | POA: Diagnosis not present

## 2016-03-09 DIAGNOSIS — I4891 Unspecified atrial fibrillation: Secondary | ICD-10-CM | POA: Diagnosis not present

## 2016-03-09 DIAGNOSIS — R05 Cough: Secondary | ICD-10-CM

## 2016-03-09 DIAGNOSIS — I129 Hypertensive chronic kidney disease with stage 1 through stage 4 chronic kidney disease, or unspecified chronic kidney disease: Secondary | ICD-10-CM | POA: Diagnosis not present

## 2016-03-09 DIAGNOSIS — N189 Chronic kidney disease, unspecified: Secondary | ICD-10-CM | POA: Diagnosis not present

## 2016-03-09 DIAGNOSIS — R059 Cough, unspecified: Secondary | ICD-10-CM

## 2016-03-09 DIAGNOSIS — C3411 Malignant neoplasm of upper lobe, right bronchus or lung: Secondary | ICD-10-CM

## 2016-03-09 DIAGNOSIS — Z9981 Dependence on supplemental oxygen: Secondary | ICD-10-CM | POA: Diagnosis not present

## 2016-03-09 MED ORDER — GUAIFENESIN-CODEINE 100-10 MG/5ML PO SYRP: ORAL_SOLUTION | ORAL | 0 refills | Status: DC

## 2016-03-10 DIAGNOSIS — N189 Chronic kidney disease, unspecified: Secondary | ICD-10-CM | POA: Diagnosis not present

## 2016-03-10 DIAGNOSIS — R600 Localized edema: Secondary | ICD-10-CM | POA: Diagnosis not present

## 2016-03-10 DIAGNOSIS — I4891 Unspecified atrial fibrillation: Secondary | ICD-10-CM | POA: Diagnosis not present

## 2016-03-10 DIAGNOSIS — I129 Hypertensive chronic kidney disease with stage 1 through stage 4 chronic kidney disease, or unspecified chronic kidney disease: Secondary | ICD-10-CM | POA: Diagnosis not present

## 2016-03-10 DIAGNOSIS — Z9981 Dependence on supplemental oxygen: Secondary | ICD-10-CM | POA: Diagnosis not present

## 2016-03-10 DIAGNOSIS — C3411 Malignant neoplasm of upper lobe, right bronchus or lung: Secondary | ICD-10-CM | POA: Diagnosis not present

## 2016-03-16 DIAGNOSIS — Z9981 Dependence on supplemental oxygen: Secondary | ICD-10-CM | POA: Diagnosis not present

## 2016-03-16 DIAGNOSIS — R600 Localized edema: Secondary | ICD-10-CM | POA: Diagnosis not present

## 2016-03-16 DIAGNOSIS — C3411 Malignant neoplasm of upper lobe, right bronchus or lung: Secondary | ICD-10-CM | POA: Diagnosis not present

## 2016-03-16 DIAGNOSIS — I129 Hypertensive chronic kidney disease with stage 1 through stage 4 chronic kidney disease, or unspecified chronic kidney disease: Secondary | ICD-10-CM | POA: Diagnosis not present

## 2016-03-16 DIAGNOSIS — I4891 Unspecified atrial fibrillation: Secondary | ICD-10-CM | POA: Diagnosis not present

## 2016-03-16 DIAGNOSIS — N189 Chronic kidney disease, unspecified: Secondary | ICD-10-CM | POA: Diagnosis not present

## 2016-03-23 DIAGNOSIS — I129 Hypertensive chronic kidney disease with stage 1 through stage 4 chronic kidney disease, or unspecified chronic kidney disease: Secondary | ICD-10-CM | POA: Diagnosis not present

## 2016-03-23 DIAGNOSIS — I4891 Unspecified atrial fibrillation: Secondary | ICD-10-CM | POA: Diagnosis not present

## 2016-03-23 DIAGNOSIS — Z9981 Dependence on supplemental oxygen: Secondary | ICD-10-CM | POA: Diagnosis not present

## 2016-03-23 DIAGNOSIS — N189 Chronic kidney disease, unspecified: Secondary | ICD-10-CM | POA: Diagnosis not present

## 2016-03-23 DIAGNOSIS — C3411 Malignant neoplasm of upper lobe, right bronchus or lung: Secondary | ICD-10-CM | POA: Diagnosis not present

## 2016-03-23 DIAGNOSIS — R600 Localized edema: Secondary | ICD-10-CM | POA: Diagnosis not present

## 2016-03-30 DIAGNOSIS — Z9981 Dependence on supplemental oxygen: Secondary | ICD-10-CM | POA: Diagnosis not present

## 2016-03-30 DIAGNOSIS — I4891 Unspecified atrial fibrillation: Secondary | ICD-10-CM | POA: Diagnosis not present

## 2016-03-30 DIAGNOSIS — I129 Hypertensive chronic kidney disease with stage 1 through stage 4 chronic kidney disease, or unspecified chronic kidney disease: Secondary | ICD-10-CM | POA: Diagnosis not present

## 2016-03-30 DIAGNOSIS — N189 Chronic kidney disease, unspecified: Secondary | ICD-10-CM | POA: Diagnosis not present

## 2016-03-30 DIAGNOSIS — R600 Localized edema: Secondary | ICD-10-CM | POA: Diagnosis not present

## 2016-03-30 DIAGNOSIS — C3411 Malignant neoplasm of upper lobe, right bronchus or lung: Secondary | ICD-10-CM | POA: Diagnosis not present

## 2016-03-31 DIAGNOSIS — N189 Chronic kidney disease, unspecified: Secondary | ICD-10-CM | POA: Diagnosis not present

## 2016-03-31 DIAGNOSIS — I129 Hypertensive chronic kidney disease with stage 1 through stage 4 chronic kidney disease, or unspecified chronic kidney disease: Secondary | ICD-10-CM | POA: Diagnosis not present

## 2016-03-31 DIAGNOSIS — Z9981 Dependence on supplemental oxygen: Secondary | ICD-10-CM | POA: Diagnosis not present

## 2016-03-31 DIAGNOSIS — R600 Localized edema: Secondary | ICD-10-CM | POA: Diagnosis not present

## 2016-03-31 DIAGNOSIS — I4891 Unspecified atrial fibrillation: Secondary | ICD-10-CM | POA: Diagnosis not present

## 2016-03-31 DIAGNOSIS — C3411 Malignant neoplasm of upper lobe, right bronchus or lung: Secondary | ICD-10-CM | POA: Diagnosis not present

## 2016-04-03 DIAGNOSIS — Z87891 Personal history of nicotine dependence: Secondary | ICD-10-CM | POA: Diagnosis not present

## 2016-04-03 DIAGNOSIS — Z9981 Dependence on supplemental oxygen: Secondary | ICD-10-CM | POA: Diagnosis not present

## 2016-04-03 DIAGNOSIS — D649 Anemia, unspecified: Secondary | ICD-10-CM | POA: Diagnosis not present

## 2016-04-03 DIAGNOSIS — N189 Chronic kidney disease, unspecified: Secondary | ICD-10-CM | POA: Diagnosis not present

## 2016-04-03 DIAGNOSIS — I129 Hypertensive chronic kidney disease with stage 1 through stage 4 chronic kidney disease, or unspecified chronic kidney disease: Secondary | ICD-10-CM | POA: Diagnosis not present

## 2016-04-03 DIAGNOSIS — E039 Hypothyroidism, unspecified: Secondary | ICD-10-CM | POA: Diagnosis not present

## 2016-04-03 DIAGNOSIS — R600 Localized edema: Secondary | ICD-10-CM | POA: Diagnosis not present

## 2016-04-03 DIAGNOSIS — C3411 Malignant neoplasm of upper lobe, right bronchus or lung: Secondary | ICD-10-CM | POA: Diagnosis not present

## 2016-04-03 DIAGNOSIS — I4891 Unspecified atrial fibrillation: Secondary | ICD-10-CM | POA: Diagnosis not present

## 2016-04-04 DIAGNOSIS — I10 Essential (primary) hypertension: Secondary | ICD-10-CM | POA: Diagnosis not present

## 2016-04-04 DIAGNOSIS — I4891 Unspecified atrial fibrillation: Secondary | ICD-10-CM | POA: Diagnosis not present

## 2016-04-04 DIAGNOSIS — I2729 Other secondary pulmonary hypertension: Secondary | ICD-10-CM | POA: Diagnosis not present

## 2016-04-04 DIAGNOSIS — I509 Heart failure, unspecified: Secondary | ICD-10-CM | POA: Diagnosis not present

## 2016-04-04 DIAGNOSIS — I428 Other cardiomyopathies: Secondary | ICD-10-CM | POA: Diagnosis not present

## 2016-04-04 DIAGNOSIS — R0602 Shortness of breath: Secondary | ICD-10-CM | POA: Diagnosis not present

## 2016-04-07 DIAGNOSIS — C3411 Malignant neoplasm of upper lobe, right bronchus or lung: Secondary | ICD-10-CM | POA: Diagnosis not present

## 2016-04-07 DIAGNOSIS — I129 Hypertensive chronic kidney disease with stage 1 through stage 4 chronic kidney disease, or unspecified chronic kidney disease: Secondary | ICD-10-CM | POA: Diagnosis not present

## 2016-04-07 DIAGNOSIS — N189 Chronic kidney disease, unspecified: Secondary | ICD-10-CM | POA: Diagnosis not present

## 2016-04-07 DIAGNOSIS — I4891 Unspecified atrial fibrillation: Secondary | ICD-10-CM | POA: Diagnosis not present

## 2016-04-07 DIAGNOSIS — R600 Localized edema: Secondary | ICD-10-CM | POA: Diagnosis not present

## 2016-04-07 DIAGNOSIS — Z9981 Dependence on supplemental oxygen: Secondary | ICD-10-CM | POA: Diagnosis not present

## 2016-04-12 DIAGNOSIS — Z9981 Dependence on supplemental oxygen: Secondary | ICD-10-CM | POA: Diagnosis not present

## 2016-04-12 DIAGNOSIS — I129 Hypertensive chronic kidney disease with stage 1 through stage 4 chronic kidney disease, or unspecified chronic kidney disease: Secondary | ICD-10-CM | POA: Diagnosis not present

## 2016-04-12 DIAGNOSIS — N189 Chronic kidney disease, unspecified: Secondary | ICD-10-CM | POA: Diagnosis not present

## 2016-04-12 DIAGNOSIS — C3411 Malignant neoplasm of upper lobe, right bronchus or lung: Secondary | ICD-10-CM | POA: Diagnosis not present

## 2016-04-12 DIAGNOSIS — R600 Localized edema: Secondary | ICD-10-CM | POA: Diagnosis not present

## 2016-04-12 DIAGNOSIS — I4891 Unspecified atrial fibrillation: Secondary | ICD-10-CM | POA: Diagnosis not present

## 2016-04-13 DIAGNOSIS — I4891 Unspecified atrial fibrillation: Secondary | ICD-10-CM | POA: Diagnosis not present

## 2016-04-13 DIAGNOSIS — N189 Chronic kidney disease, unspecified: Secondary | ICD-10-CM | POA: Diagnosis not present

## 2016-04-13 DIAGNOSIS — I129 Hypertensive chronic kidney disease with stage 1 through stage 4 chronic kidney disease, or unspecified chronic kidney disease: Secondary | ICD-10-CM | POA: Diagnosis not present

## 2016-04-13 DIAGNOSIS — C3411 Malignant neoplasm of upper lobe, right bronchus or lung: Secondary | ICD-10-CM | POA: Diagnosis not present

## 2016-04-13 DIAGNOSIS — Z9981 Dependence on supplemental oxygen: Secondary | ICD-10-CM | POA: Diagnosis not present

## 2016-04-13 DIAGNOSIS — R600 Localized edema: Secondary | ICD-10-CM | POA: Diagnosis not present

## 2016-04-14 ENCOUNTER — Other Ambulatory Visit: Payer: Self-pay | Admitting: *Deleted

## 2016-04-14 DIAGNOSIS — C3411 Malignant neoplasm of upper lobe, right bronchus or lung: Secondary | ICD-10-CM

## 2016-04-18 ENCOUNTER — Inpatient Hospital Stay

## 2016-04-18 ENCOUNTER — Inpatient Hospital Stay: Attending: Internal Medicine | Admitting: Internal Medicine

## 2016-04-18 VITALS — BP 99/63 | HR 92 | Temp 98.0°F | Resp 22 | Ht <= 58 in | Wt 168.0 lb

## 2016-04-18 DIAGNOSIS — Z9221 Personal history of antineoplastic chemotherapy: Secondary | ICD-10-CM | POA: Insufficient documentation

## 2016-04-18 DIAGNOSIS — E1122 Type 2 diabetes mellitus with diabetic chronic kidney disease: Secondary | ICD-10-CM | POA: Diagnosis not present

## 2016-04-18 DIAGNOSIS — E039 Hypothyroidism, unspecified: Secondary | ICD-10-CM | POA: Insufficient documentation

## 2016-04-18 DIAGNOSIS — C3411 Malignant neoplasm of upper lobe, right bronchus or lung: Secondary | ICD-10-CM

## 2016-04-18 DIAGNOSIS — Z923 Personal history of irradiation: Secondary | ICD-10-CM | POA: Insufficient documentation

## 2016-04-18 DIAGNOSIS — I129 Hypertensive chronic kidney disease with stage 1 through stage 4 chronic kidney disease, or unspecified chronic kidney disease: Secondary | ICD-10-CM | POA: Diagnosis not present

## 2016-04-18 DIAGNOSIS — Z87891 Personal history of nicotine dependence: Secondary | ICD-10-CM | POA: Diagnosis not present

## 2016-04-18 DIAGNOSIS — N184 Chronic kidney disease, stage 4 (severe): Secondary | ICD-10-CM | POA: Insufficient documentation

## 2016-04-18 DIAGNOSIS — I4891 Unspecified atrial fibrillation: Secondary | ICD-10-CM | POA: Diagnosis not present

## 2016-04-18 DIAGNOSIS — Z79899 Other long term (current) drug therapy: Secondary | ICD-10-CM | POA: Diagnosis not present

## 2016-04-18 LAB — CBC WITH DIFFERENTIAL/PLATELET
BASOS PCT: 1 %
Basophils Absolute: 0 10*3/uL (ref 0–0.1)
EOS ABS: 0.1 10*3/uL (ref 0–0.7)
Eosinophils Relative: 2 %
HCT: 28.2 % — ABNORMAL LOW (ref 35.0–47.0)
HEMOGLOBIN: 9.3 g/dL — AB (ref 12.0–16.0)
Lymphocytes Relative: 18 %
Lymphs Abs: 0.8 10*3/uL — ABNORMAL LOW (ref 1.0–3.6)
MCH: 27.5 pg (ref 26.0–34.0)
MCHC: 33 g/dL (ref 32.0–36.0)
MCV: 83.1 fL (ref 80.0–100.0)
MONOS PCT: 10 %
Monocytes Absolute: 0.5 10*3/uL (ref 0.2–0.9)
Neutro Abs: 3.2 10*3/uL (ref 1.4–6.5)
Neutrophils Relative %: 69 %
Platelets: 221 10*3/uL (ref 150–440)
RBC: 3.4 MIL/uL — ABNORMAL LOW (ref 3.80–5.20)
RDW: 18.6 % — ABNORMAL HIGH (ref 11.5–14.5)
WBC: 4.6 10*3/uL (ref 3.6–11.0)

## 2016-04-18 LAB — BASIC METABOLIC PANEL
Anion gap: 7 (ref 5–15)
BUN: 18 mg/dL (ref 6–20)
CALCIUM: 8.4 mg/dL — AB (ref 8.9–10.3)
CHLORIDE: 99 mmol/L — AB (ref 101–111)
CO2: 28 mmol/L (ref 22–32)
CREATININE: 1.49 mg/dL — AB (ref 0.44–1.00)
GFR calc non Af Amer: 33 mL/min — ABNORMAL LOW (ref >60)
GFR, EST AFRICAN AMERICAN: 39 mL/min — AB (ref >60)
Glucose, Bld: 143 mg/dL — ABNORMAL HIGH (ref 65–99)
Potassium: 3.5 mmol/L (ref 3.5–5.1)
SODIUM: 134 mmol/L — AB (ref 135–145)

## 2016-04-18 LAB — SAMPLE TO BLOOD BANK

## 2016-04-18 NOTE — Assessment & Plan Note (Addendum)
#   LUNG CA- non-small cell lung stage IV; s/p Opdivo every 2 weeks x#4; PET scan MARCH 2017-right lower lobe lesion improving in size [currently 2.2 x 2.5 from 3.5 x3cm]; and the right lung continues to show extensive radiation changes.  Patient's treatments are currently on hold because of declining performance status. .   # Clinically patient is stable; continue monitoring for now. No obvious progression of her lung cancer on a clinical basis. Continue hospice care.  # Hemoglobin is 9.2/ stable; monitor for now.   # Cough- ? Allergies/ recommend wearing mask outside.   # chronic kidney disease with Creatinine -1.3-1.4/stable.   # port flush- thru hospice RN.   # labs/hold tube in 1 months; follow up with me 3 monthslabs-hold tube/no treatment.

## 2016-04-18 NOTE — Progress Notes (Signed)
[facility] OFFICE PROGRESS NOTE  Patient Care Team: Arnetha Courser, MD as PCP - General (Family Medicine) Manya Silvas, MD (Gastroenterology) Cammie Sickle, MD as Consulting Physician (Oncology) Lavonia Dana, MD as Consulting Physician (Nephrology)   SUMMARY OF ONCOLOGIC HISTORY:  Oncology History   1. 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 CT-guided biopsy on 02/10/11 - insufficient for definite diagnosis. Rare group of atypical cells. Got chemotherapy with Paclitaxel/Carboplatin (started on 02/14/11, completed cycle 6 day 8 on 08/22/11). Also got concurrent radiation. 01/18/12 - Underwent Preoperative bronchoscopy with bronchoalveolar lavage right upper lobe. Right thoracoscopy with pleural biopsy and talc pleurodesis. Negative for malignancy. PET scan on 03/29/12 IMPRESSION: Multifocal pleural activity in the right chest suggesting pleural tumor spread. Activity is also noted in the right hilar region suggesting persistent tumor in this region. April 04, 2012 - VERISTRAT test reported as 'GOOD'. Started Tarceva on 05/17/12. July 2014 - declining performance status, unable to tolerate low dose Tarceva, was on home hospice which was discontinued since patient doing steady.     Lung cancer [facility])    Initial Diagnosis    Lung cancer ([place])       Cancer of upper lobe of right lung ([place])   07/27/2015 Initial Diagnosis    Cancer of upper lobe of right lung ([place])        INTERVAL HISTORY:  A very pleasant 76 year old female patient with above history of recurrent/metastatic non-small cell lung cancer status  Currently in hospice is here for follow-up.  Patient has mild cough. Question related to pollen. Stable chronic shortness of breath with exertion. She continues to use a walker/wheelchair for ambulation. She  continues to wear oxygen. No fevers or chills.  As per family she is continuing spend most of the time resting /wheelchair for ambulation.   REVIEW OF SYSTEMS:  A complete 10 point review of system is done which is negative except mentioned above/history of present illness.   PAST MEDICAL HISTORY :  Past Medical History:  Diagnosis Date  . Anemia   . Anemia   . Atrial fibrillation ([place])    Dr. Humphrey Rolls, cardiologist  . CKD (chronic kidney disease) stage 4, GFR 15-29 ml/min ([place]) 05/01/2015  . Hypertension   . Hypothyroidism   . Lung cancer ([place])   . Lung cancer ([place])     PAST SURGICAL HISTORY :   Past Surgical History:  Procedure Laterality Date  . COLONOSCOPY N/A 10/11/2014   Procedure: COLONOSCOPY;  Surgeon: Manya Silvas, MD;  Location: [facility] ENDOSCOPY;  Service: Endoscopy;  Laterality: N/A;  . ESOPHAGOGASTRODUODENOSCOPY N/A 10/10/2014   Procedure: ESOPHAGOGASTRODUODENOSCOPY (EGD);  Surgeon: Manya Silvas, MD;  Location: [facility] ENDOSCOPY;  Service: Endoscopy;  Laterality: N/A;  . UPPER GASTROINTESTINAL ENDOSCOPY  02/24/14    FAMILY HISTORY :   Family History  Problem Relation Age of Onset  . Stomach cancer Mother   . Cancer Mother     unsure where but states "it spead all over"  . Alzheimer's disease Father   . Varicose Veins Daughter   . Diabetes Neg Hx   . Heart disease Neg Hx   . Hypertension Neg Hx   . Stroke Neg Hx   . COPD Neg Hx     SOCIAL HISTORY:   Social History  Substance Use Topics  . Smoking status: Former Smoker    Packs/day: 0.75  Years: 30.00    Types: Cigarettes    Quit date: 01/03/2005  . Smokeless tobacco: Never Used  . Alcohol use No    ALLERGIES:  has No Known Allergies.  MEDICATIONS:  Current Outpatient Prescriptions  Medication Sig Dispense Refill  . benzonatate (TESSALON) 100 MG capsule TAKE 1 CAPSULE BY MOUTH 3 TIMES DAILY ASNEEDED COUGH 90 capsule 1  . Calcium Carb-Cholecalciferol (CALCIUM 600/VITAMIN D3 PO) Take 2 tablets by  mouth daily.     . carvedilol (COREG) 6.25 MG tablet Take 6.25 mg by mouth daily.    . chlorpheniramine (ALLERGY) 4 MG tablet Take 4 mg by mouth every 4 (four) hours as needed for allergies or rhinitis. Reported on 01/02/2015    . ferrous gluconate (FERATE) 240 (27 FE) MG tablet Take 240 mg by mouth 3 (three) times daily with meals.    . gabapentin (NEURONTIN) 100 MG capsule TAKE 1 CAPSULE BY MOUTH TWO TIMES DAILY 180 capsule 3  . guaiFENesin-codeine (VIRTUSSIN A/C) 100-10 MG/5ML syrup TAKE 1 TEASPOONSFUL BY MOUTH 3 TIMES DAILY AS NEEDED COUGH 240 mL 0  . isosorbide mononitrate (IMDUR) 30 MG 24 hr tablet Take 30 mg by mouth daily.    Marland Kitchen levothyroxine (SYNTHROID, LEVOTHROID) 50 MCG tablet TAKE 1 TABLET BY MOUTH  DAILY FOR 5 DAYS EACH WEEK  AND TAKE ONE-HALF TABLET BY MOUTH DAILY FOR 2 DAYS EACH WEEK 78 tablet 0  . pantoprazole (PROTONIX) 40 MG tablet Take 40 mg by mouth daily.    . potassium chloride SA (K-DUR,KLOR-CON) 20 MEQ tablet Take 20 mEq by mouth daily.     Marland Kitchen torsemide (DEMADEX) 10 MG tablet Take 10 mg by mouth daily.     Marland Kitchen albuterol (PROVENTIL) (2.5 MG/3ML) 0.083% nebulizer solution Inhale 3 mLs (2.5 mg total) into the lungs every 4 (four) hours as needed for wheezing or shortness of breath. 360 mL 3  . megestrol (MEGACE) 40 MG/ML suspension TAKE 15 MLS BY MOUTH DAILY (Patient not taking: Reported on 04/18/2016) 240 mL 1   No current facility-administered medications for this visit.    Facility-Administered Medications Ordered in Other Visits  Medication Dose Route Frequency Provider Last Rate Last Dose  . 0.9 %  sodium chloride infusion   Intravenous Continuous Leia Alf, MD 20 mL/hr at 07/04/14 1150    . heparin lock flush 100 unit/mL  500 Units Intravenous Once Leia Alf, MD      . heparin lock flush 100 unit/mL  500 Units Intravenous Once Leia Alf, MD      . sodium chloride 0.9 % injection 10 mL  10 mL Intravenous PRN Leia Alf, MD      . sodium chloride 0.9 %  injection 10 mL  10 mL Intravenous PRN Leia Alf, MD   10 mL at 07/04/14 1145    PHYSICAL EXAMINATION: ECOG PERFORMANCE STATUS: 3 - Symptomatic, >50% confined to bed  BP 99/63 (Patient Position: Sitting)   Pulse 92   Temp 98 F (36.7 C) (Oral)   Resp (!) 22   Ht '4\' 9"'$  (1.448 m)   Wt 168 lb (76.2 kg)   BMI 36.35 kg/m   Filed Weights   04/18/16 0850  Weight: 168 lb (76.2 kg)    GENERAL:  Alert, no distress and comfortable. Thin built/moderately nourished  In a wheel chair;  She is accompanied by husband.  EYES: positive for pallor OROPHARYNX: no thrush or ulceration; poor dentition  NECK: supple, no masses felt LYMPH:  no palpable lymphadenopathy in the cervical,  axillary or inguinal regions LUNGS: Decreased breath sounds bilaterally at the bases. Scattered wheeze noted or crackles HEART/CVS: regular rate & rhythm and no murmurs; No lower extremity edema ABDOMEN:abdomen soft, non-tender and normal bowel sounds Musculoskeletal:no cyanosis of digits and no clubbing  PSYCH: alert & oriented x 3 with fluent speech NEURO: no focal motor/sensory deficits SKIN:  no rashes or significant lesions;  LABORATORY DATA:  I have reviewed the data as listed    Component Value Date/Time   NA 134 (L) 04/18/2016 0828   NA 136 08/19/2014 1337   NA 135 (L) 02/08/2014 0844   K 3.5 04/18/2016 0828   K 4.0 02/08/2014 0844   CL 99 (L) 04/18/2016 0828   CL 105 02/08/2014 0844   CO2 28 04/18/2016 0828   CO2 21 02/08/2014 0844   GLUCOSE 143 (H) 04/18/2016 0828   GLUCOSE 157 (H) 02/08/2014 0844   BUN 18 04/18/2016 0828   BUN 32 (H) 08/19/2014 1337   BUN 15 02/08/2014 0844   CREATININE 1.49 (H) 04/18/2016 0828   CREATININE 1.36 (H) 02/08/2014 0844   CALCIUM 8.4 (L) 04/18/2016 0828   CALCIUM 8.8 02/08/2014 0844   PROT 7.7 01/06/2016 0905   PROT 7.9 12/06/2013 1453   ALBUMIN 2.6 (L) 01/06/2016 0905   ALBUMIN 2.8 (L) 12/06/2013 1453   AST 14 (L) 01/06/2016 0905   AST 16 12/06/2013 1453    ALT 5 (L) 01/06/2016 0905   ALT 12 (L) 12/06/2013 1453   ALKPHOS 77 01/06/2016 0905   ALKPHOS 82 12/06/2013 1453   BILITOT 0.6 01/06/2016 0905   BILITOT 0.3 12/06/2013 1453   GFRNONAA 33 (L) 04/18/2016 0828   GFRNONAA 41 (L) 02/08/2014 0844   GFRNONAA 32 (L) 07/30/2013 1508   GFRAA 39 (L) 04/18/2016 0828   GFRAA 49 (L) 02/08/2014 0844   GFRAA 37 (L) 07/30/2013 1508    No results found for: SPEP, UPEP  Lab Results  Component Value Date   WBC 4.6 04/18/2016   NEUTROABS 3.2 04/18/2016   HGB 9.3 (L) 04/18/2016   HCT 28.2 (L) 04/18/2016   MCV 83.1 04/18/2016   PLT 221 04/18/2016      Chemistry      Component Value Date/Time   NA 134 (L) 04/18/2016 0828   NA 136 08/19/2014 1337   NA 135 (L) 02/08/2014 0844   K 3.5 04/18/2016 0828   K 4.0 02/08/2014 0844   CL 99 (L) 04/18/2016 0828   CL 105 02/08/2014 0844   CO2 28 04/18/2016 0828   CO2 21 02/08/2014 0844   BUN 18 04/18/2016 0828   BUN 32 (H) 08/19/2014 1337   BUN 15 02/08/2014 0844   CREATININE 1.49 (H) 04/18/2016 0828   CREATININE 1.36 (H) 02/08/2014 0844      Component Value Date/Time   CALCIUM 8.4 (L) 04/18/2016 0828   CALCIUM 8.8 02/08/2014 0844   ALKPHOS 77 01/06/2016 0905   ALKPHOS 82 12/06/2013 1453   AST 14 (L) 01/06/2016 0905   AST 16 12/06/2013 1453   ALT 5 (L) 01/06/2016 0905   ALT 12 (L) 12/06/2013 1453   BILITOT 0.6 01/06/2016 0905   BILITOT 0.3 12/06/2013 1453        ASSESSMENT & PLAN:   Cancer of upper lobe of right lung ([place]) # LUNG CA- non-small cell lung stage IV; s/p Opdivo every 2 weeks x#4; PET scan MARCH 2017-right lower lobe lesion improving in size [currently 2.2 x 2.5 from 3.5 x3cm]; and the right lung continues to  show extensive radiation changes.  Patient's treatments are currently on hold because of declining performance status. .   # Clinically patient is stable; continue monitoring for now. No obvious progression of her lung cancer on a clinical basis. Continue hospice  care.  # Hemoglobin is 9.2/ stable; monitor for now.   # Cough- ? Allergies/ recommend wearing mask outside.   # chronic kidney disease with Creatinine -1.3-1.4/stable.   # port flush- thru hospice RN.   # labs/hold tube in 1 months; follow up with me 3 monthslabs-hold tube/no treatment.     Cammie Sickle, MD 04/18/2016 1:13 PM

## 2016-04-21 DIAGNOSIS — R600 Localized edema: Secondary | ICD-10-CM | POA: Diagnosis not present

## 2016-04-21 DIAGNOSIS — Z9981 Dependence on supplemental oxygen: Secondary | ICD-10-CM | POA: Diagnosis not present

## 2016-04-21 DIAGNOSIS — I4891 Unspecified atrial fibrillation: Secondary | ICD-10-CM | POA: Diagnosis not present

## 2016-04-21 DIAGNOSIS — I129 Hypertensive chronic kidney disease with stage 1 through stage 4 chronic kidney disease, or unspecified chronic kidney disease: Secondary | ICD-10-CM | POA: Diagnosis not present

## 2016-04-21 DIAGNOSIS — C3411 Malignant neoplasm of upper lobe, right bronchus or lung: Secondary | ICD-10-CM | POA: Diagnosis not present

## 2016-04-21 DIAGNOSIS — N189 Chronic kidney disease, unspecified: Secondary | ICD-10-CM | POA: Diagnosis not present

## 2016-04-22 DIAGNOSIS — Z9981 Dependence on supplemental oxygen: Secondary | ICD-10-CM | POA: Diagnosis not present

## 2016-04-22 DIAGNOSIS — I129 Hypertensive chronic kidney disease with stage 1 through stage 4 chronic kidney disease, or unspecified chronic kidney disease: Secondary | ICD-10-CM | POA: Diagnosis not present

## 2016-04-22 DIAGNOSIS — R600 Localized edema: Secondary | ICD-10-CM | POA: Diagnosis not present

## 2016-04-22 DIAGNOSIS — I4891 Unspecified atrial fibrillation: Secondary | ICD-10-CM | POA: Diagnosis not present

## 2016-04-22 DIAGNOSIS — C3411 Malignant neoplasm of upper lobe, right bronchus or lung: Secondary | ICD-10-CM | POA: Diagnosis not present

## 2016-04-22 DIAGNOSIS — N189 Chronic kidney disease, unspecified: Secondary | ICD-10-CM | POA: Diagnosis not present

## 2016-04-28 ENCOUNTER — Other Ambulatory Visit: Payer: Self-pay | Admitting: *Deleted

## 2016-04-28 DIAGNOSIS — R059 Cough, unspecified: Secondary | ICD-10-CM

## 2016-04-28 DIAGNOSIS — R600 Localized edema: Secondary | ICD-10-CM | POA: Diagnosis not present

## 2016-04-28 DIAGNOSIS — I129 Hypertensive chronic kidney disease with stage 1 through stage 4 chronic kidney disease, or unspecified chronic kidney disease: Secondary | ICD-10-CM | POA: Diagnosis not present

## 2016-04-28 DIAGNOSIS — Z9981 Dependence on supplemental oxygen: Secondary | ICD-10-CM | POA: Diagnosis not present

## 2016-04-28 DIAGNOSIS — C3411 Malignant neoplasm of upper lobe, right bronchus or lung: Secondary | ICD-10-CM | POA: Diagnosis not present

## 2016-04-28 DIAGNOSIS — I4891 Unspecified atrial fibrillation: Secondary | ICD-10-CM | POA: Diagnosis not present

## 2016-04-28 DIAGNOSIS — N189 Chronic kidney disease, unspecified: Secondary | ICD-10-CM | POA: Diagnosis not present

## 2016-04-28 DIAGNOSIS — R05 Cough: Secondary | ICD-10-CM

## 2016-04-28 MED ORDER — GUAIFENESIN-CODEINE 100-10 MG/5ML PO SYRP: ORAL_SOLUTION | ORAL | 0 refills | Status: DC

## 2016-04-29 DIAGNOSIS — Z9981 Dependence on supplemental oxygen: Secondary | ICD-10-CM | POA: Diagnosis not present

## 2016-04-29 DIAGNOSIS — C3411 Malignant neoplasm of upper lobe, right bronchus or lung: Secondary | ICD-10-CM | POA: Diagnosis not present

## 2016-04-29 DIAGNOSIS — N189 Chronic kidney disease, unspecified: Secondary | ICD-10-CM | POA: Diagnosis not present

## 2016-04-29 DIAGNOSIS — R600 Localized edema: Secondary | ICD-10-CM | POA: Diagnosis not present

## 2016-04-29 DIAGNOSIS — I4891 Unspecified atrial fibrillation: Secondary | ICD-10-CM | POA: Diagnosis not present

## 2016-04-29 DIAGNOSIS — I129 Hypertensive chronic kidney disease with stage 1 through stage 4 chronic kidney disease, or unspecified chronic kidney disease: Secondary | ICD-10-CM | POA: Diagnosis not present

## 2016-05-03 DIAGNOSIS — Z9981 Dependence on supplemental oxygen: Secondary | ICD-10-CM | POA: Diagnosis not present

## 2016-05-03 DIAGNOSIS — R600 Localized edema: Secondary | ICD-10-CM | POA: Diagnosis not present

## 2016-05-03 DIAGNOSIS — D649 Anemia, unspecified: Secondary | ICD-10-CM | POA: Diagnosis not present

## 2016-05-03 DIAGNOSIS — E039 Hypothyroidism, unspecified: Secondary | ICD-10-CM | POA: Diagnosis not present

## 2016-05-03 DIAGNOSIS — N189 Chronic kidney disease, unspecified: Secondary | ICD-10-CM | POA: Diagnosis not present

## 2016-05-03 DIAGNOSIS — Z87891 Personal history of nicotine dependence: Secondary | ICD-10-CM | POA: Diagnosis not present

## 2016-05-03 DIAGNOSIS — I129 Hypertensive chronic kidney disease with stage 1 through stage 4 chronic kidney disease, or unspecified chronic kidney disease: Secondary | ICD-10-CM | POA: Diagnosis not present

## 2016-05-03 DIAGNOSIS — I4891 Unspecified atrial fibrillation: Secondary | ICD-10-CM | POA: Diagnosis not present

## 2016-05-03 DIAGNOSIS — C3411 Malignant neoplasm of upper lobe, right bronchus or lung: Secondary | ICD-10-CM | POA: Diagnosis not present

## 2016-05-04 DIAGNOSIS — D649 Anemia, unspecified: Secondary | ICD-10-CM | POA: Diagnosis not present

## 2016-05-04 DIAGNOSIS — R600 Localized edema: Secondary | ICD-10-CM | POA: Diagnosis not present

## 2016-05-04 DIAGNOSIS — I4891 Unspecified atrial fibrillation: Secondary | ICD-10-CM | POA: Diagnosis not present

## 2016-05-04 DIAGNOSIS — C3411 Malignant neoplasm of upper lobe, right bronchus or lung: Secondary | ICD-10-CM | POA: Diagnosis not present

## 2016-05-04 DIAGNOSIS — I129 Hypertensive chronic kidney disease with stage 1 through stage 4 chronic kidney disease, or unspecified chronic kidney disease: Secondary | ICD-10-CM | POA: Diagnosis not present

## 2016-05-04 DIAGNOSIS — N189 Chronic kidney disease, unspecified: Secondary | ICD-10-CM | POA: Diagnosis not present

## 2016-05-05 DIAGNOSIS — I428 Other cardiomyopathies: Secondary | ICD-10-CM | POA: Diagnosis not present

## 2016-05-05 DIAGNOSIS — R0602 Shortness of breath: Secondary | ICD-10-CM | POA: Diagnosis not present

## 2016-05-05 DIAGNOSIS — I509 Heart failure, unspecified: Secondary | ICD-10-CM | POA: Diagnosis not present

## 2016-05-05 DIAGNOSIS — I2729 Other secondary pulmonary hypertension: Secondary | ICD-10-CM | POA: Diagnosis not present

## 2016-05-05 DIAGNOSIS — I4891 Unspecified atrial fibrillation: Secondary | ICD-10-CM | POA: Diagnosis not present

## 2016-05-05 DIAGNOSIS — I10 Essential (primary) hypertension: Secondary | ICD-10-CM | POA: Diagnosis not present

## 2016-05-09 ENCOUNTER — Telehealth: Payer: Self-pay | Admitting: Family Medicine

## 2016-05-09 NOTE — Telephone Encounter (Signed)
Patient is due for her TSH; please give a gentle reminder; thank you

## 2016-05-10 NOTE — Telephone Encounter (Signed)
Pt appointment is Thursday. She states she will get the TSH done then.

## 2016-05-11 DIAGNOSIS — D649 Anemia, unspecified: Secondary | ICD-10-CM | POA: Diagnosis not present

## 2016-05-11 DIAGNOSIS — N189 Chronic kidney disease, unspecified: Secondary | ICD-10-CM | POA: Diagnosis not present

## 2016-05-11 DIAGNOSIS — I4891 Unspecified atrial fibrillation: Secondary | ICD-10-CM | POA: Diagnosis not present

## 2016-05-11 DIAGNOSIS — I129 Hypertensive chronic kidney disease with stage 1 through stage 4 chronic kidney disease, or unspecified chronic kidney disease: Secondary | ICD-10-CM | POA: Diagnosis not present

## 2016-05-11 DIAGNOSIS — C3411 Malignant neoplasm of upper lobe, right bronchus or lung: Secondary | ICD-10-CM | POA: Diagnosis not present

## 2016-05-11 DIAGNOSIS — R600 Localized edema: Secondary | ICD-10-CM | POA: Diagnosis not present

## 2016-05-12 ENCOUNTER — Ambulatory Visit: Admitting: Family Medicine

## 2016-05-16 ENCOUNTER — Telehealth: Payer: Self-pay

## 2016-05-16 ENCOUNTER — Encounter: Payer: Self-pay | Admitting: Family Medicine

## 2016-05-16 ENCOUNTER — Ambulatory Visit (INDEPENDENT_AMBULATORY_CARE_PROVIDER_SITE_OTHER): Payer: Medicare Other | Admitting: Family Medicine

## 2016-05-16 VITALS — BP 98/64 | HR 105 | Temp 98.3°F | Resp 14 | Wt 145.3 lb

## 2016-05-16 DIAGNOSIS — D508 Other iron deficiency anemias: Secondary | ICD-10-CM | POA: Diagnosis not present

## 2016-05-16 DIAGNOSIS — K219 Gastro-esophageal reflux disease without esophagitis: Secondary | ICD-10-CM | POA: Diagnosis not present

## 2016-05-16 DIAGNOSIS — E038 Other specified hypothyroidism: Secondary | ICD-10-CM

## 2016-05-16 DIAGNOSIS — I10 Essential (primary) hypertension: Secondary | ICD-10-CM

## 2016-05-16 DIAGNOSIS — C3490 Malignant neoplasm of unspecified part of unspecified bronchus or lung: Secondary | ICD-10-CM

## 2016-05-16 DIAGNOSIS — N183 Chronic kidney disease, stage 3 unspecified: Secondary | ICD-10-CM

## 2016-05-16 DIAGNOSIS — R634 Abnormal weight loss: Secondary | ICD-10-CM

## 2016-05-16 LAB — BASIC METABOLIC PANEL
BUN: 17 mg/dL (ref 7–25)
CO2: 27 mmol/L (ref 20–31)
CREATININE: 1.71 mg/dL — AB (ref 0.60–0.93)
Calcium: 8.4 mg/dL — ABNORMAL LOW (ref 8.6–10.4)
Chloride: 99 mmol/L (ref 98–110)
Glucose, Bld: 164 mg/dL — ABNORMAL HIGH (ref 65–99)
Potassium: 4.2 mmol/L (ref 3.5–5.3)
Sodium: 136 mmol/L (ref 135–146)

## 2016-05-16 LAB — CBC WITH DIFFERENTIAL/PLATELET
Basophils Absolute: 0 cells/uL (ref 0–200)
Basophils Relative: 0 %
Eosinophils Absolute: 53 cells/uL (ref 15–500)
Eosinophils Relative: 1 %
HEMATOCRIT: 30.7 % — AB (ref 35.0–45.0)
Hemoglobin: 9.5 g/dL — ABNORMAL LOW (ref 11.7–15.5)
LYMPHS PCT: 16 %
Lymphs Abs: 848 cells/uL — ABNORMAL LOW (ref 850–3900)
MCH: 26.2 pg — AB (ref 27.0–33.0)
MCHC: 30.9 g/dL — ABNORMAL LOW (ref 32.0–36.0)
MCV: 84.8 fL (ref 80.0–100.0)
MPV: 8.3 fL (ref 7.5–12.5)
Monocytes Absolute: 477 cells/uL (ref 200–950)
Monocytes Relative: 9 %
NEUTROS ABS: 3922 {cells}/uL (ref 1500–7800)
Neutrophils Relative %: 74 %
PLATELETS: 261 10*3/uL (ref 140–400)
RBC: 3.62 MIL/uL — ABNORMAL LOW (ref 3.80–5.10)
RDW: 17.5 % — ABNORMAL HIGH (ref 11.0–15.0)
WBC: 5.3 10*3/uL (ref 3.8–10.8)

## 2016-05-16 LAB — TSH: TSH: 3.71 m[IU]/L

## 2016-05-16 NOTE — Assessment & Plan Note (Signed)
Followed by Dr. Jacinto Reap, under Hospice care

## 2016-05-16 NOTE — Assessment & Plan Note (Signed)
Well controlled 

## 2016-05-16 NOTE — Progress Notes (Signed)
BP 98/64   Pulse (!) 105   Temp 98.3 F (36.8 C) (Oral)   Resp 14   Wt 145 lb 4.8 oz (65.9 kg)   SpO2 91%   BMI 31.44 kg/m    Subjective:    Patient ID: Katrina Kramer, female    DOB: 1940/02/04, 76 y.o.   MRN: [id number]  HPI: Katrina Kramer is a 76 y.o. female  Chief Complaint  Patient presents with  . Follow-up   HPI Stage IV lung cancer; seeing Dr. Rogue Bussing They had stopped her fluid pill but had to restart the fluid pill about a month ago They went to see her heart doctor, DR. Humphrey Rolls; he recommended starting the fluid pill every day Appetite is good; eats pretty good Sleeping okay, gets up 2x a night to use the bathroom [facility] is the same, not worse On 24/7 supplemental oxygen; through Hospice; coming every three, Mitzi Church No problems with heartburn as long as she takes PPI Not having issues requiring gabapentin; willing to stop it  Depression screen [facility] 2/9 05/16/2016 01/12/2016 07/30/2015 04/13/2015  Decreased Interest 0 0 0 0  Down, Depressed, Hopeless 0 0 0 0  PHQ - 2 Score 0 0 0 0   Relevant past medical, surgical, family and social history reviewed Past Medical History:  Diagnosis Date  . Anemia   . Anemia   . Atrial fibrillation ([place])    Dr. Humphrey Rolls, cardiologist  . CKD (chronic kidney disease) stage 4, GFR 15-29 ml/min ([place]) 05/01/2015  . Hypertension   . Hypothyroidism   . Lung cancer ([place])   . Lung cancer [facility])    Past Surgical History:  Procedure Laterality Date  . COLONOSCOPY N/A 10/11/2014   Procedure: COLONOSCOPY;  Surgeon: Manya Silvas, MD;  Location[facility] ENDOSCOPY;  Service: Endoscopy;  Laterality: N/A;  . ESOPHAGOGASTRODUODENOSCOPY N/A 10/10/2014   Procedure: ESOPHAGOGASTRODUODENOSCOPY (EGD);  Surgeon: Manya Silvas, MD;  Location: [facility] ENDOSCOPY;  Service: Endoscopy;  Laterality: N/A;  . UPPER GASTROINTESTINAL ENDOSCOPY  02/24/14   Family History  Problem Relation Age of Onset  . Stomach cancer Mother   . Cancer Mother        unsure  where but states "it spead all over"  . Alzheimer's disease Father   . Varicose Veins Daughter   . Diabetes Neg Hx   . Heart disease Neg Hx   . Hypertension Neg Hx   . Stroke Neg Hx   . COPD Neg Hx    Social History   Social History  . Marital status: Married    Spouse name: N/A  . Number of children: N/A  . Years of education: N/A   Occupational History  . Not on file.   Social History Main Topics  . Smoking status: Former Smoker    Packs/day: 0.75    Years: 30.00    Types: Cigarettes    Quit date: 01/03/2005  . Smokeless tobacco: Never Used  . Alcohol use No  . Drug use: No  . Sexual activity: Not on file   Other Topics Concern  . Not on file   Social History Narrative  . No narrative on file   Interim medical history since last visit reviewed. Allergies and medications reviewed  Review of Systems Per HPI unless specifically indicated above     Objective:    BP 98/64   Pulse (!) 105   Temp 98.3 F (36.8 C) (Oral)   Resp 14   Wt 145 lb 4.8  oz (65.9 kg)   SpO2 91%   BMI 31.44 kg/m   Wt Readings from Last 3 Encounters:  05/16/16 145 lb 4.8 oz (65.9 kg)  04/18/16 168 lb (76.2 kg)  01/12/16 162 lb 4 oz (73.6 kg)    Physical Exam  Constitutional: No distress.  Elderly female seated in wheelchair, gait not assessed; appears chronically ill but nontoxic; significant weight loss noted  HENT:  Head: Normocephalic and atraumatic.  Eyes: EOM are normal. No scleral icterus.  Neck: No JVD present. No thyromegaly present.  Cardiovascular: Normal rate.   Pulmonary/Chest: Effort normal. No accessory muscle usage. No respiratory distress. She has decreased breath sounds.  Abdominal: She exhibits no distension.  Musculoskeletal: She exhibits no edema.  Neurological: She is alert.  Skin: Skin is warm. She is not diaphoretic. No pallor.  Psychiatric: She has a normal mood and affect. Her mood appears not anxious. She does not exhibit a depressed mood.  Good eye  contact with examiner      Assessment & Plan:   Problem List Items Addressed This Visit      Cardiovascular and Mediastinum   Hypertension    Well-controlled      Relevant Medications   carvedilol (COREG) 12.5 MG tablet   isosorbide mononitrate (IMDUR) 30 MG 24 hr tablet     Respiratory   Non-small cell carcinoma of lung (HCC)    Followed by Dr. Jacinto Reap, under Hospice care        Digestive   Acid reflux    Controlled on PPI; hx of gastric ulcers; stage IV cancer; will continue for protection        Endocrine   Hypothyroidism - Primary    Check TSH      Relevant Medications   carvedilol (COREG) 12.5 MG tablet   levothyroxine (SYNTHROID, LEVOTHROID) 50 MCG tablet   Other Relevant Orders   TSH (Completed)     Genitourinary   CKD (chronic kidney disease) stage 3, GFR 30-59 ml/min    Diuretic was started back; will check renal function      Relevant Orders   Basic metabolic panel (Completed)     Other   Anemia    Check labs, send to cancer doctor      Relevant Orders   CBC with Differential/Platelet (Completed)    Other Visit Diagnoses    Abnormal weight loss       check TSH to see if dose needs adjustment; may be secondary to her cancer; receiving hospice care      Follow up plan: Return in about 4 months (around 09/16/2016) for follow-up visit with Dr. Sanda Klein.  An after-visit summary was printed and given to the patient at [place].  Please see the patient instructions which may contain other information and recommendations beyond what is mentioned above in the assessment and plan.  Meds ordered this encounter  Medications  . carvedilol (COREG) 12.5 MG tablet    Sig: Take 12.5 mg by mouth daily.  . isosorbide mononitrate (IMDUR) 30 MG 24 hr tablet    Sig: Take 30 mg by mouth daily.  Marland Kitchen levothyroxine (SYNTHROID, LEVOTHROID) 50 MCG tablet    Sig: Take 50 mcg by mouth daily.    Orders Placed This Encounter  Procedures  . TSH  . Basic metabolic panel  .  CBC with Differential/Platelet

## 2016-05-16 NOTE — Assessment & Plan Note (Signed)
Check TSH 

## 2016-05-16 NOTE — Patient Instructions (Signed)
Cut down on the gabapentin to just once a day for three days, then stop it completely Let's get labs today

## 2016-05-16 NOTE — Assessment & Plan Note (Signed)
Diuretic was started back; will check renal function

## 2016-05-16 NOTE — Assessment & Plan Note (Signed)
Check labs, send to cancer doctor

## 2016-05-16 NOTE — Telephone Encounter (Signed)
Yes, when I get the results tomorrow, we'll send them to Dr. Rogue Bussing

## 2016-05-16 NOTE — Telephone Encounter (Signed)
Pt husband you all talked about the pt not having labs done again at the cancer center. He states you were going to do something or fax over the results. He's not sure what exactly you were going to do but he would like to know before her appt wednesday.

## 2016-05-16 NOTE — Assessment & Plan Note (Signed)
Controlled on PPI; hx of gastric ulcers; stage IV cancer; will continue for protection

## 2016-05-18 ENCOUNTER — Inpatient Hospital Stay: Payer: Medicare Other | Attending: Internal Medicine

## 2016-05-18 ENCOUNTER — Inpatient Hospital Stay: Payer: Medicare Other

## 2016-05-18 DIAGNOSIS — C3411 Malignant neoplasm of upper lobe, right bronchus or lung: Secondary | ICD-10-CM

## 2016-05-19 DIAGNOSIS — I4891 Unspecified atrial fibrillation: Secondary | ICD-10-CM | POA: Diagnosis not present

## 2016-05-19 DIAGNOSIS — C3411 Malignant neoplasm of upper lobe, right bronchus or lung: Secondary | ICD-10-CM | POA: Diagnosis not present

## 2016-05-19 DIAGNOSIS — I129 Hypertensive chronic kidney disease with stage 1 through stage 4 chronic kidney disease, or unspecified chronic kidney disease: Secondary | ICD-10-CM | POA: Diagnosis not present

## 2016-05-19 DIAGNOSIS — R600 Localized edema: Secondary | ICD-10-CM | POA: Diagnosis not present

## 2016-05-19 DIAGNOSIS — N189 Chronic kidney disease, unspecified: Secondary | ICD-10-CM | POA: Diagnosis not present

## 2016-05-19 DIAGNOSIS — D649 Anemia, unspecified: Secondary | ICD-10-CM | POA: Diagnosis not present

## 2016-05-20 DIAGNOSIS — D649 Anemia, unspecified: Secondary | ICD-10-CM | POA: Diagnosis not present

## 2016-05-20 DIAGNOSIS — I129 Hypertensive chronic kidney disease with stage 1 through stage 4 chronic kidney disease, or unspecified chronic kidney disease: Secondary | ICD-10-CM | POA: Diagnosis not present

## 2016-05-20 DIAGNOSIS — N189 Chronic kidney disease, unspecified: Secondary | ICD-10-CM | POA: Diagnosis not present

## 2016-05-20 DIAGNOSIS — C3411 Malignant neoplasm of upper lobe, right bronchus or lung: Secondary | ICD-10-CM | POA: Diagnosis not present

## 2016-05-20 DIAGNOSIS — R600 Localized edema: Secondary | ICD-10-CM | POA: Diagnosis not present

## 2016-05-20 DIAGNOSIS — I4891 Unspecified atrial fibrillation: Secondary | ICD-10-CM | POA: Diagnosis not present

## 2016-05-23 ENCOUNTER — Telehealth: Payer: Self-pay | Admitting: Family Medicine

## 2016-05-23 NOTE — Telephone Encounter (Signed)
Husband requesting return call for lab results. Also he need to discuss 3 medications that his wife is currently taking  1) digoxin 0.'125mg'$  2) torsemide '10mg'$   3) mylan-levothyrox .'05mg'$   would like to know if she need to continue to take them. Please return Vernard Gambles (husband) call [phone]

## 2016-05-23 NOTE — Telephone Encounter (Signed)
Pt husband spoke with front desk rep at [facility] regarding pt lab results faxed over to them from Dr. Sanda Klein. He states nothing was faxed  Over. Looked in the chart and saw that dr. lada asked Roselyn Reef to fax over the lab results. spoke with Roselyn Reef and she stated she did fax it over. I menton to the pt husband that I will call and speak with them and fax over the results. Spoke with front desk and she states nothing was scan, therefore I print off the results and [facility] faxed over the results.

## 2016-05-23 NOTE — Telephone Encounter (Signed)
Patients husband notified of labs again.  We faxed results to cardio, I told patient to call them to see if any of her meds need adjusting since they have not called them back.

## 2016-05-23 NOTE — Telephone Encounter (Signed)
Pts husband called about her labs and would like a call back.

## 2016-05-25 ENCOUNTER — Other Ambulatory Visit: Payer: Self-pay | Admitting: *Deleted

## 2016-05-25 MED ORDER — ALBUTEROL SULFATE (2.5 MG/3ML) 0.083% IN NEBU: 2.5000 mg | INHALATION_SOLUTION | RESPIRATORY_TRACT | 3 refills | Status: DC | PRN

## 2016-05-27 ENCOUNTER — Other Ambulatory Visit: Payer: Self-pay | Admitting: *Deleted

## 2016-05-27 DIAGNOSIS — R600 Localized edema: Secondary | ICD-10-CM | POA: Diagnosis not present

## 2016-05-27 DIAGNOSIS — I129 Hypertensive chronic kidney disease with stage 1 through stage 4 chronic kidney disease, or unspecified chronic kidney disease: Secondary | ICD-10-CM | POA: Diagnosis not present

## 2016-05-27 DIAGNOSIS — R05 Cough: Secondary | ICD-10-CM

## 2016-05-27 DIAGNOSIS — C3411 Malignant neoplasm of upper lobe, right bronchus or lung: Secondary | ICD-10-CM | POA: Diagnosis not present

## 2016-05-27 DIAGNOSIS — I4891 Unspecified atrial fibrillation: Secondary | ICD-10-CM | POA: Diagnosis not present

## 2016-05-27 DIAGNOSIS — D649 Anemia, unspecified: Secondary | ICD-10-CM | POA: Diagnosis not present

## 2016-05-27 DIAGNOSIS — R059 Cough, unspecified: Secondary | ICD-10-CM

## 2016-05-27 DIAGNOSIS — N189 Chronic kidney disease, unspecified: Secondary | ICD-10-CM | POA: Diagnosis not present

## 2016-05-27 MED ORDER — GUAIFENESIN-CODEINE 100-10 MG/5ML PO SYRP: ORAL_SOLUTION | ORAL | 0 refills | Status: DC

## 2016-05-28 ENCOUNTER — Other Ambulatory Visit: Payer: Self-pay | Admitting: Internal Medicine

## 2016-05-28 DIAGNOSIS — R05 Cough: Secondary | ICD-10-CM

## 2016-05-28 DIAGNOSIS — C3411 Malignant neoplasm of upper lobe, right bronchus or lung: Secondary | ICD-10-CM

## 2016-05-28 DIAGNOSIS — I4891 Unspecified atrial fibrillation: Secondary | ICD-10-CM | POA: Diagnosis not present

## 2016-05-28 DIAGNOSIS — I129 Hypertensive chronic kidney disease with stage 1 through stage 4 chronic kidney disease, or unspecified chronic kidney disease: Secondary | ICD-10-CM | POA: Diagnosis not present

## 2016-05-28 DIAGNOSIS — N189 Chronic kidney disease, unspecified: Secondary | ICD-10-CM | POA: Diagnosis not present

## 2016-05-28 DIAGNOSIS — R600 Localized edema: Secondary | ICD-10-CM | POA: Diagnosis not present

## 2016-05-28 DIAGNOSIS — D649 Anemia, unspecified: Secondary | ICD-10-CM | POA: Diagnosis not present

## 2016-05-28 DIAGNOSIS — R059 Cough, unspecified: Secondary | ICD-10-CM

## 2016-06-01 DIAGNOSIS — I4891 Unspecified atrial fibrillation: Secondary | ICD-10-CM | POA: Diagnosis not present

## 2016-06-01 DIAGNOSIS — N189 Chronic kidney disease, unspecified: Secondary | ICD-10-CM | POA: Diagnosis not present

## 2016-06-01 DIAGNOSIS — I129 Hypertensive chronic kidney disease with stage 1 through stage 4 chronic kidney disease, or unspecified chronic kidney disease: Secondary | ICD-10-CM | POA: Diagnosis not present

## 2016-06-01 DIAGNOSIS — R600 Localized edema: Secondary | ICD-10-CM | POA: Diagnosis not present

## 2016-06-01 DIAGNOSIS — C3411 Malignant neoplasm of upper lobe, right bronchus or lung: Secondary | ICD-10-CM | POA: Diagnosis not present

## 2016-06-01 DIAGNOSIS — D649 Anemia, unspecified: Secondary | ICD-10-CM | POA: Diagnosis not present

## 2016-06-02 ENCOUNTER — Other Ambulatory Visit: Payer: Self-pay | Admitting: Internal Medicine

## 2016-06-02 DIAGNOSIS — D649 Anemia, unspecified: Secondary | ICD-10-CM | POA: Diagnosis not present

## 2016-06-02 DIAGNOSIS — C3411 Malignant neoplasm of upper lobe, right bronchus or lung: Secondary | ICD-10-CM | POA: Diagnosis not present

## 2016-06-02 DIAGNOSIS — I129 Hypertensive chronic kidney disease with stage 1 through stage 4 chronic kidney disease, or unspecified chronic kidney disease: Secondary | ICD-10-CM | POA: Diagnosis not present

## 2016-06-02 DIAGNOSIS — N189 Chronic kidney disease, unspecified: Secondary | ICD-10-CM | POA: Diagnosis not present

## 2016-06-02 DIAGNOSIS — I4891 Unspecified atrial fibrillation: Secondary | ICD-10-CM | POA: Diagnosis not present

## 2016-06-02 DIAGNOSIS — R600 Localized edema: Secondary | ICD-10-CM | POA: Diagnosis not present

## 2016-06-03 DIAGNOSIS — R600 Localized edema: Secondary | ICD-10-CM | POA: Diagnosis not present

## 2016-06-03 DIAGNOSIS — Z9981 Dependence on supplemental oxygen: Secondary | ICD-10-CM | POA: Diagnosis not present

## 2016-06-03 DIAGNOSIS — I4891 Unspecified atrial fibrillation: Secondary | ICD-10-CM | POA: Diagnosis not present

## 2016-06-03 DIAGNOSIS — D649 Anemia, unspecified: Secondary | ICD-10-CM | POA: Diagnosis not present

## 2016-06-03 DIAGNOSIS — N189 Chronic kidney disease, unspecified: Secondary | ICD-10-CM | POA: Diagnosis not present

## 2016-06-03 DIAGNOSIS — I129 Hypertensive chronic kidney disease with stage 1 through stage 4 chronic kidney disease, or unspecified chronic kidney disease: Secondary | ICD-10-CM | POA: Diagnosis not present

## 2016-06-03 DIAGNOSIS — E039 Hypothyroidism, unspecified: Secondary | ICD-10-CM | POA: Diagnosis not present

## 2016-06-03 DIAGNOSIS — C3411 Malignant neoplasm of upper lobe, right bronchus or lung: Secondary | ICD-10-CM | POA: Diagnosis not present

## 2016-06-03 DIAGNOSIS — Z87891 Personal history of nicotine dependence: Secondary | ICD-10-CM | POA: Diagnosis not present

## 2016-06-06 DIAGNOSIS — D631 Anemia in chronic kidney disease: Secondary | ICD-10-CM | POA: Diagnosis not present

## 2016-06-06 DIAGNOSIS — N183 Chronic kidney disease, stage 3 (moderate): Secondary | ICD-10-CM | POA: Diagnosis not present

## 2016-06-06 DIAGNOSIS — E871 Hypo-osmolality and hyponatremia: Secondary | ICD-10-CM | POA: Diagnosis not present

## 2016-06-06 DIAGNOSIS — I129 Hypertensive chronic kidney disease with stage 1 through stage 4 chronic kidney disease, or unspecified chronic kidney disease: Secondary | ICD-10-CM | POA: Diagnosis not present

## 2016-06-08 DIAGNOSIS — R600 Localized edema: Secondary | ICD-10-CM | POA: Diagnosis not present

## 2016-06-08 DIAGNOSIS — I4891 Unspecified atrial fibrillation: Secondary | ICD-10-CM | POA: Diagnosis not present

## 2016-06-08 DIAGNOSIS — C3411 Malignant neoplasm of upper lobe, right bronchus or lung: Secondary | ICD-10-CM | POA: Diagnosis not present

## 2016-06-08 DIAGNOSIS — I129 Hypertensive chronic kidney disease with stage 1 through stage 4 chronic kidney disease, or unspecified chronic kidney disease: Secondary | ICD-10-CM | POA: Diagnosis not present

## 2016-06-08 DIAGNOSIS — N189 Chronic kidney disease, unspecified: Secondary | ICD-10-CM | POA: Diagnosis not present

## 2016-06-08 DIAGNOSIS — D649 Anemia, unspecified: Secondary | ICD-10-CM | POA: Diagnosis not present

## 2016-06-13 DIAGNOSIS — D649 Anemia, unspecified: Secondary | ICD-10-CM | POA: Diagnosis not present

## 2016-06-13 DIAGNOSIS — I129 Hypertensive chronic kidney disease with stage 1 through stage 4 chronic kidney disease, or unspecified chronic kidney disease: Secondary | ICD-10-CM | POA: Diagnosis not present

## 2016-06-13 DIAGNOSIS — N189 Chronic kidney disease, unspecified: Secondary | ICD-10-CM | POA: Diagnosis not present

## 2016-06-13 DIAGNOSIS — R600 Localized edema: Secondary | ICD-10-CM | POA: Diagnosis not present

## 2016-06-13 DIAGNOSIS — I4891 Unspecified atrial fibrillation: Secondary | ICD-10-CM | POA: Diagnosis not present

## 2016-06-13 DIAGNOSIS — C3411 Malignant neoplasm of upper lobe, right bronchus or lung: Secondary | ICD-10-CM | POA: Diagnosis not present

## 2016-06-15 DIAGNOSIS — I129 Hypertensive chronic kidney disease with stage 1 through stage 4 chronic kidney disease, or unspecified chronic kidney disease: Secondary | ICD-10-CM | POA: Diagnosis not present

## 2016-06-15 DIAGNOSIS — R600 Localized edema: Secondary | ICD-10-CM | POA: Diagnosis not present

## 2016-06-15 DIAGNOSIS — D649 Anemia, unspecified: Secondary | ICD-10-CM | POA: Diagnosis not present

## 2016-06-15 DIAGNOSIS — N189 Chronic kidney disease, unspecified: Secondary | ICD-10-CM | POA: Diagnosis not present

## 2016-06-15 DIAGNOSIS — C3411 Malignant neoplasm of upper lobe, right bronchus or lung: Secondary | ICD-10-CM | POA: Diagnosis not present

## 2016-06-15 DIAGNOSIS — I4891 Unspecified atrial fibrillation: Secondary | ICD-10-CM | POA: Diagnosis not present

## 2016-06-17 DIAGNOSIS — R600 Localized edema: Secondary | ICD-10-CM | POA: Diagnosis not present

## 2016-06-17 DIAGNOSIS — D649 Anemia, unspecified: Secondary | ICD-10-CM | POA: Diagnosis not present

## 2016-06-17 DIAGNOSIS — I129 Hypertensive chronic kidney disease with stage 1 through stage 4 chronic kidney disease, or unspecified chronic kidney disease: Secondary | ICD-10-CM | POA: Diagnosis not present

## 2016-06-17 DIAGNOSIS — C3411 Malignant neoplasm of upper lobe, right bronchus or lung: Secondary | ICD-10-CM | POA: Diagnosis not present

## 2016-06-17 DIAGNOSIS — I4891 Unspecified atrial fibrillation: Secondary | ICD-10-CM | POA: Diagnosis not present

## 2016-06-17 DIAGNOSIS — N189 Chronic kidney disease, unspecified: Secondary | ICD-10-CM | POA: Diagnosis not present

## 2016-06-19 ENCOUNTER — Other Ambulatory Visit: Payer: Self-pay | Admitting: Family Medicine

## 2016-06-22 ENCOUNTER — Other Ambulatory Visit: Payer: Self-pay | Admitting: *Deleted

## 2016-06-22 DIAGNOSIS — C3411 Malignant neoplasm of upper lobe, right bronchus or lung: Secondary | ICD-10-CM | POA: Diagnosis not present

## 2016-06-22 DIAGNOSIS — I129 Hypertensive chronic kidney disease with stage 1 through stage 4 chronic kidney disease, or unspecified chronic kidney disease: Secondary | ICD-10-CM | POA: Diagnosis not present

## 2016-06-22 DIAGNOSIS — R600 Localized edema: Secondary | ICD-10-CM | POA: Diagnosis not present

## 2016-06-22 DIAGNOSIS — R059 Cough, unspecified: Secondary | ICD-10-CM

## 2016-06-22 DIAGNOSIS — I4891 Unspecified atrial fibrillation: Secondary | ICD-10-CM | POA: Diagnosis not present

## 2016-06-22 DIAGNOSIS — D649 Anemia, unspecified: Secondary | ICD-10-CM | POA: Diagnosis not present

## 2016-06-22 DIAGNOSIS — R05 Cough: Secondary | ICD-10-CM

## 2016-06-22 DIAGNOSIS — N189 Chronic kidney disease, unspecified: Secondary | ICD-10-CM | POA: Diagnosis not present

## 2016-06-22 MED ORDER — GUAIFENESIN-CODEINE 100-10 MG/5ML PO SYRP: ORAL_SOLUTION | ORAL | 0 refills | Status: DC

## 2016-06-24 DIAGNOSIS — I509 Heart failure, unspecified: Secondary | ICD-10-CM | POA: Diagnosis not present

## 2016-06-24 DIAGNOSIS — R0602 Shortness of breath: Secondary | ICD-10-CM | POA: Diagnosis not present

## 2016-06-24 DIAGNOSIS — I270 Primary pulmonary hypertension: Secondary | ICD-10-CM | POA: Diagnosis not present

## 2016-06-24 DIAGNOSIS — I428 Other cardiomyopathies: Secondary | ICD-10-CM | POA: Diagnosis not present

## 2016-06-24 DIAGNOSIS — I2729 Other secondary pulmonary hypertension: Secondary | ICD-10-CM | POA: Diagnosis not present

## 2016-06-28 ENCOUNTER — Other Ambulatory Visit: Payer: Self-pay | Admitting: Internal Medicine

## 2016-06-28 DIAGNOSIS — I4891 Unspecified atrial fibrillation: Secondary | ICD-10-CM | POA: Diagnosis not present

## 2016-06-28 DIAGNOSIS — R600 Localized edema: Secondary | ICD-10-CM | POA: Diagnosis not present

## 2016-06-28 DIAGNOSIS — C3411 Malignant neoplasm of upper lobe, right bronchus or lung: Secondary | ICD-10-CM | POA: Diagnosis not present

## 2016-06-28 DIAGNOSIS — I129 Hypertensive chronic kidney disease with stage 1 through stage 4 chronic kidney disease, or unspecified chronic kidney disease: Secondary | ICD-10-CM | POA: Diagnosis not present

## 2016-06-28 DIAGNOSIS — N189 Chronic kidney disease, unspecified: Secondary | ICD-10-CM | POA: Diagnosis not present

## 2016-06-28 DIAGNOSIS — D649 Anemia, unspecified: Secondary | ICD-10-CM | POA: Diagnosis not present

## 2016-06-29 DIAGNOSIS — I129 Hypertensive chronic kidney disease with stage 1 through stage 4 chronic kidney disease, or unspecified chronic kidney disease: Secondary | ICD-10-CM | POA: Diagnosis not present

## 2016-06-29 DIAGNOSIS — N189 Chronic kidney disease, unspecified: Secondary | ICD-10-CM | POA: Diagnosis not present

## 2016-06-29 DIAGNOSIS — C3411 Malignant neoplasm of upper lobe, right bronchus or lung: Secondary | ICD-10-CM | POA: Diagnosis not present

## 2016-06-29 DIAGNOSIS — D649 Anemia, unspecified: Secondary | ICD-10-CM | POA: Diagnosis not present

## 2016-06-29 DIAGNOSIS — I4891 Unspecified atrial fibrillation: Secondary | ICD-10-CM | POA: Diagnosis not present

## 2016-06-29 DIAGNOSIS — R600 Localized edema: Secondary | ICD-10-CM | POA: Diagnosis not present

## 2016-07-03 DIAGNOSIS — R630 Anorexia: Secondary | ICD-10-CM | POA: Diagnosis not present

## 2016-07-03 DIAGNOSIS — D649 Anemia, unspecified: Secondary | ICD-10-CM | POA: Diagnosis not present

## 2016-07-03 DIAGNOSIS — R600 Localized edema: Secondary | ICD-10-CM | POA: Diagnosis not present

## 2016-07-03 DIAGNOSIS — E039 Hypothyroidism, unspecified: Secondary | ICD-10-CM | POA: Diagnosis not present

## 2016-07-03 DIAGNOSIS — N189 Chronic kidney disease, unspecified: Secondary | ICD-10-CM | POA: Diagnosis not present

## 2016-07-03 DIAGNOSIS — C3411 Malignant neoplasm of upper lobe, right bronchus or lung: Secondary | ICD-10-CM | POA: Diagnosis not present

## 2016-07-03 DIAGNOSIS — Z452 Encounter for adjustment and management of vascular access device: Secondary | ICD-10-CM | POA: Diagnosis not present

## 2016-07-03 DIAGNOSIS — R634 Abnormal weight loss: Secondary | ICD-10-CM | POA: Diagnosis not present

## 2016-07-03 DIAGNOSIS — I129 Hypertensive chronic kidney disease with stage 1 through stage 4 chronic kidney disease, or unspecified chronic kidney disease: Secondary | ICD-10-CM | POA: Diagnosis not present

## 2016-07-03 DIAGNOSIS — Z87891 Personal history of nicotine dependence: Secondary | ICD-10-CM | POA: Diagnosis not present

## 2016-07-03 DIAGNOSIS — I4891 Unspecified atrial fibrillation: Secondary | ICD-10-CM | POA: Diagnosis not present

## 2016-07-03 DIAGNOSIS — Z9981 Dependence on supplemental oxygen: Secondary | ICD-10-CM | POA: Diagnosis not present

## 2016-07-08 DIAGNOSIS — I129 Hypertensive chronic kidney disease with stage 1 through stage 4 chronic kidney disease, or unspecified chronic kidney disease: Secondary | ICD-10-CM | POA: Diagnosis not present

## 2016-07-08 DIAGNOSIS — I4891 Unspecified atrial fibrillation: Secondary | ICD-10-CM | POA: Diagnosis not present

## 2016-07-08 DIAGNOSIS — N189 Chronic kidney disease, unspecified: Secondary | ICD-10-CM | POA: Diagnosis not present

## 2016-07-08 DIAGNOSIS — R600 Localized edema: Secondary | ICD-10-CM | POA: Diagnosis not present

## 2016-07-08 DIAGNOSIS — Z9981 Dependence on supplemental oxygen: Secondary | ICD-10-CM | POA: Diagnosis not present

## 2016-07-08 DIAGNOSIS — C3411 Malignant neoplasm of upper lobe, right bronchus or lung: Secondary | ICD-10-CM | POA: Diagnosis not present

## 2016-07-13 DIAGNOSIS — C3411 Malignant neoplasm of upper lobe, right bronchus or lung: Secondary | ICD-10-CM | POA: Diagnosis not present

## 2016-07-13 DIAGNOSIS — I129 Hypertensive chronic kidney disease with stage 1 through stage 4 chronic kidney disease, or unspecified chronic kidney disease: Secondary | ICD-10-CM | POA: Diagnosis not present

## 2016-07-13 DIAGNOSIS — R600 Localized edema: Secondary | ICD-10-CM | POA: Diagnosis not present

## 2016-07-13 DIAGNOSIS — I4891 Unspecified atrial fibrillation: Secondary | ICD-10-CM | POA: Diagnosis not present

## 2016-07-13 DIAGNOSIS — Z9981 Dependence on supplemental oxygen: Secondary | ICD-10-CM | POA: Diagnosis not present

## 2016-07-13 DIAGNOSIS — N189 Chronic kidney disease, unspecified: Secondary | ICD-10-CM | POA: Diagnosis not present

## 2016-07-14 ENCOUNTER — Other Ambulatory Visit: Payer: Self-pay | Admitting: Oncology

## 2016-07-14 DIAGNOSIS — C3411 Malignant neoplasm of upper lobe, right bronchus or lung: Secondary | ICD-10-CM

## 2016-07-14 DIAGNOSIS — R059 Cough, unspecified: Secondary | ICD-10-CM

## 2016-07-14 DIAGNOSIS — R05 Cough: Secondary | ICD-10-CM

## 2016-07-15 ENCOUNTER — Other Ambulatory Visit: Payer: Self-pay | Admitting: *Deleted

## 2016-07-15 DIAGNOSIS — D508 Other iron deficiency anemias: Secondary | ICD-10-CM

## 2016-07-15 DIAGNOSIS — C3490 Malignant neoplasm of unspecified part of unspecified bronchus or lung: Secondary | ICD-10-CM

## 2016-07-18 ENCOUNTER — Inpatient Hospital Stay: Attending: Internal Medicine | Admitting: Internal Medicine

## 2016-07-18 ENCOUNTER — Inpatient Hospital Stay

## 2016-07-18 VITALS — BP 113/63 | HR 118 | Temp 97.6°F | Ht <= 58 in | Wt 124.0 lb

## 2016-07-18 DIAGNOSIS — Z79899 Other long term (current) drug therapy: Secondary | ICD-10-CM | POA: Insufficient documentation

## 2016-07-18 DIAGNOSIS — Z9221 Personal history of antineoplastic chemotherapy: Secondary | ICD-10-CM | POA: Diagnosis not present

## 2016-07-18 DIAGNOSIS — E039 Hypothyroidism, unspecified: Secondary | ICD-10-CM | POA: Insufficient documentation

## 2016-07-18 DIAGNOSIS — N184 Chronic kidney disease, stage 4 (severe): Secondary | ICD-10-CM | POA: Diagnosis not present

## 2016-07-18 DIAGNOSIS — I129 Hypertensive chronic kidney disease with stage 1 through stage 4 chronic kidney disease, or unspecified chronic kidney disease: Secondary | ICD-10-CM | POA: Insufficient documentation

## 2016-07-18 DIAGNOSIS — Z9981 Dependence on supplemental oxygen: Secondary | ICD-10-CM | POA: Insufficient documentation

## 2016-07-18 DIAGNOSIS — R05 Cough: Secondary | ICD-10-CM | POA: Diagnosis not present

## 2016-07-18 DIAGNOSIS — Z800 Family history of malignant neoplasm of digestive organs: Secondary | ICD-10-CM | POA: Insufficient documentation

## 2016-07-18 DIAGNOSIS — R0609 Other forms of dyspnea: Secondary | ICD-10-CM | POA: Insufficient documentation

## 2016-07-18 DIAGNOSIS — C3411 Malignant neoplasm of upper lobe, right bronchus or lung: Secondary | ICD-10-CM | POA: Diagnosis not present

## 2016-07-18 DIAGNOSIS — Z808 Family history of malignant neoplasm of other organs or systems: Secondary | ICD-10-CM | POA: Diagnosis not present

## 2016-07-18 DIAGNOSIS — Z923 Personal history of irradiation: Secondary | ICD-10-CM | POA: Diagnosis not present

## 2016-07-18 DIAGNOSIS — Z87891 Personal history of nicotine dependence: Secondary | ICD-10-CM | POA: Diagnosis not present

## 2016-07-18 DIAGNOSIS — I4891 Unspecified atrial fibrillation: Secondary | ICD-10-CM | POA: Insufficient documentation

## 2016-07-18 DIAGNOSIS — R634 Abnormal weight loss: Secondary | ICD-10-CM | POA: Insufficient documentation

## 2016-07-18 DIAGNOSIS — D508 Other iron deficiency anemias: Secondary | ICD-10-CM

## 2016-07-18 DIAGNOSIS — C3490 Malignant neoplasm of unspecified part of unspecified bronchus or lung: Secondary | ICD-10-CM

## 2016-07-18 LAB — SAMPLE TO BLOOD BANK

## 2016-07-18 LAB — BASIC METABOLIC PANEL
ANION GAP: 8 (ref 5–15)
BUN: 18 mg/dL (ref 6–20)
CHLORIDE: 99 mmol/L — AB (ref 101–111)
CO2: 26 mmol/L (ref 22–32)
Calcium: 8.4 mg/dL — ABNORMAL LOW (ref 8.9–10.3)
Creatinine, Ser: 1.29 mg/dL — ABNORMAL HIGH (ref 0.44–1.00)
GFR calc non Af Amer: 40 mL/min — ABNORMAL LOW (ref >60)
GFR, EST AFRICAN AMERICAN: 46 mL/min — AB (ref >60)
Glucose, Bld: 146 mg/dL — ABNORMAL HIGH (ref 65–99)
Potassium: 4.1 mmol/L (ref 3.5–5.1)
Sodium: 133 mmol/L — ABNORMAL LOW (ref 135–145)

## 2016-07-18 LAB — CBC WITH DIFFERENTIAL/PLATELET
Basophils Absolute: 0 10*3/uL (ref 0–0.1)
Basophils Relative: 0 %
Eosinophils Absolute: 0 10*3/uL (ref 0–0.7)
Eosinophils Relative: 1 %
HEMATOCRIT: 28.1 % — AB (ref 35.0–47.0)
HEMOGLOBIN: 9.4 g/dL — AB (ref 12.0–16.0)
LYMPHS ABS: 0.4 10*3/uL — AB (ref 1.0–3.6)
Lymphocytes Relative: 7 %
MCH: 28.2 pg (ref 26.0–34.0)
MCHC: 33.5 g/dL (ref 32.0–36.0)
MCV: 84.2 fL (ref 80.0–100.0)
MONOS PCT: 8 %
Monocytes Absolute: 0.5 10*3/uL (ref 0.2–0.9)
NEUTROS ABS: 5.1 10*3/uL (ref 1.4–6.5)
NEUTROS PCT: 84 %
Platelets: 280 10*3/uL (ref 150–440)
RBC: 3.34 MIL/uL — ABNORMAL LOW (ref 3.80–5.20)
RDW: 17.4 % — ABNORMAL HIGH (ref 11.5–14.5)
WBC: 6 10*3/uL (ref 3.6–11.0)

## 2016-07-18 NOTE — Progress Notes (Signed)
[facility] OFFICE PROGRESS NOTE  Patient Care Team: Lada, Satira Anis, MD as PCP - General (Family Medicine) Manya Silvas, MD (Gastroenterology) Cammie Sickle, MD as Consulting Physician (Oncology) Lavonia Dana, MD as Consulting Physician (Nephrology)   SUMMARY OF ONCOLOGIC HISTORY:  Oncology History   1. 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 CT-guided biopsy on 02/10/11 - insufficient for definite diagnosis. Rare group of atypical cells. Got chemotherapy with Paclitaxel/Carboplatin (started on 02/14/11, completed cycle 6 day 8 on 08/22/11). Also got concurrent radiation. 01/18/12 - Underwent Preoperative bronchoscopy with bronchoalveolar lavage right upper lobe. Right thoracoscopy with pleural biopsy and talc pleurodesis. Negative for malignancy. PET scan on 03/29/12 IMPRESSION: Multifocal pleural activity in the right chest suggesting pleural tumor spread. Activity is also noted in the right hilar region suggesting persistent tumor in this region. April 04, 2012 - VERISTRAT test reported as 'GOOD'. Started Tarceva on 05/17/12. July 2014 - declining performance status, unable to tolerate low dose Tarceva, was on home hospice which was discontinued since patient doing steady.     Lung cancer [facility])    Initial Diagnosis    Lung cancer ([place])       Cancer of upper lobe of right lung ([place])   07/27/2015 Initial Diagnosis    Cancer of upper lobe of right lung ([place])        INTERVAL HISTORY:  A very pleasant 76 year old female patient with above history of recurrent/metastatic non-small cell lung cancer status  Currently in hospice is here for follow-up.  Patient continues to have a mild cough with minimal clear sputum production. Stable chronic shortness of breath with exertion. She continues to use a  walker/wheelchair for ambulation. She continues to wear oxygen. No fevers or chills. She does not have an appetite. She has started Boost supplementation last week. Still taking megace.   As per family she is continuing spend most of the time resting /wheelchair for ambulation.   REVIEW OF SYSTEMS:  A complete 10 point review of system is done which is negative except mentioned above/history of present illness.   PAST MEDICAL HISTORY :  Past Medical History:  Diagnosis Date  . Anemia   . Anemia   . Atrial fibrillation ([place])    Dr. Humphrey Rolls, cardiologist  . CKD (chronic kidney disease) stage 4, GFR 15-29 ml/min ([place]) 05/01/2015  . Hypertension   . Hypothyroidism   . Lung cancer ([place])   . Lung cancer ([place])     PAST SURGICAL HISTORY :   Past Surgical History:  Procedure Laterality Date  . COLONOSCOPY N/A 10/11/2014   Procedure: COLONOSCOPY;  Surgeon: Manya Silvas, MD;  Location: [facility] ENDOSCOPY;  Service: Endoscopy;  Laterality: N/A;  . ESOPHAGOGASTRODUODENOSCOPY N/A 10/10/2014   Procedure: ESOPHAGOGASTRODUODENOSCOPY (EGD);  Surgeon: Manya Silvas, MD;  Location: [facility] ENDOSCOPY;  Service: Endoscopy;  Laterality: N/A;  . UPPER GASTROINTESTINAL ENDOSCOPY  02/24/14    FAMILY HISTORY :   Family History  Problem Relation Age of Onset  . Stomach cancer Mother   . Cancer Mother        unsure where but states "it spead all over"  . Alzheimer's disease Father   . Varicose Veins Daughter   . Diabetes Neg Hx   . Heart disease Neg Hx   . Hypertension Neg Hx   . Stroke Neg Hx   . COPD Neg Hx  SOCIAL HISTORY:   Social History  Substance Use Topics  . Smoking status: Former Smoker    Packs/day: 0.75    Years: 30.00    Types: Cigarettes    Quit date: 01/03/2005  . Smokeless tobacco: Never Used  . Alcohol use No    ALLERGIES:  has No Known Allergies.  MEDICATIONS:  Current Outpatient Prescriptions  Medication Sig Dispense Refill  . albuterol (PROVENTIL) (2.5 MG/3ML) 0.083%  nebulizer solution Inhale 3 mLs (2.5 mg total) into the lungs every 4 (four) hours as needed for wheezing or shortness of breath. 360 mL 3  . benzonatate (TESSALON) 100 MG capsule TAKE 1 CAPSULE BY MOUTH 3 TIMES DAILY ASNEEDED COUGH 90 capsule 1  . Calcium Carb-Cholecalciferol (CALCIUM 600/VITAMIN D3 PO) Take 2 tablets by mouth daily.     . carvedilol (COREG) 12.5 MG tablet Take 12.5 mg by mouth daily.    . ferrous gluconate (FERATE) 240 (27 FE) MG tablet Take 240 mg by mouth 3 (three) times daily with meals.    Marland Kitchen GNP COUGH DM ER 30 MG/5ML liquid TAKE 1 TEASPOONFUL EVERY 6 HOURS AS NEEDED FOR COUGH 246 mL 2  . guaiFENesin-codeine (VIRTUSSIN A/C) 100-10 MG/5ML syrup TAKE 1 TEASPOONSFUL BY MOUTH 3 TIMES DAILY AS NEEDED COUGH 240 mL 0  . isosorbide mononitrate (IMDUR) 30 MG 24 hr tablet Take 30 mg by mouth daily.    Marland Kitchen levothyroxine (SYNTHROID, LEVOTHROID) 50 MCG tablet TAKE 1 TABLET BY MOUTH  DAILY FOR 5 DAYS EACH WEEK  AND TAKE ONE-HALF TABLET BY MOUTH DAILY FOR 2 DAYS EACH WEEK 78 tablet 0  . loratadine (CLARITIN) 10 MG tablet TAKE 1 TABLET BY MOUTH ONCE DAILY 15 tablet 2  . megestrol (MEGACE) 40 MG/ML suspension TAKE 15 MLS BY MOUTH DAILY 240 mL 1  . pantoprazole (PROTONIX) 40 MG tablet Take 40 mg by mouth daily.    . potassium chloride SA (K-DUR,KLOR-CON) 20 MEQ tablet Take 20 mEq by mouth daily.     Marland Kitchen torsemide (DEMADEX) 10 MG tablet Take 10 mg by mouth daily.      No current facility-administered medications for this visit.    Facility-Administered Medications Ordered in Other Visits  Medication Dose Route Frequency Provider Last Rate Last Dose  . 0.9 %  sodium chloride infusion   Intravenous Continuous Leia Alf, MD 20 mL/hr at 07/04/14 1150    . heparin lock flush 100 unit/mL  500 Units Intravenous Once Leia Alf, MD      . heparin lock flush 100 unit/mL  500 Units Intravenous Once Leia Alf, MD      . sodium chloride 0.9 % injection 10 mL  10 mL Intravenous PRN Leia Alf, MD      . sodium chloride 0.9 % injection 10 mL  10 mL Intravenous PRN Leia Alf, MD   10 mL at 07/04/14 1145    PHYSICAL EXAMINATION: ECOG PERFORMANCE STATUS: 3 - Symptomatic, >50% confined to bed  BP 113/63   Pulse (!) 118   Temp 97.6 F (36.4 C) (Tympanic)   Ht 4\' 9"  (1.448 m)   Wt 124 lb (56.2 kg)   SpO2 95%   BMI 26.83 kg/m   Filed Weights   07/18/16 1142  Weight: 124 lb (56.2 kg)    GENERAL:  Alert, no distress and comfortable. Thin built/moderately nourished  In a wheel chair;  She is accompanied by husband.  EYES: positive for pallor OROPHARYNX: no thrush or ulceration; poor dentition  NECK: supple, no masses  felt LYMPH:  no palpable lymphadenopathy in the cervical, axillary or inguinal regions LUNGS: Decreased breath sounds bilaterally at the bases. Lungs are clear.  HEART/CVS: regular rate & rhythm and no murmurs; No lower extremity edema ABDOMEN:abdomen soft, non-tender and normal bowel sounds Musculoskeletal:no cyanosis of digits and no clubbing  PSYCH: alert & oriented x 3 with fluent speech NEURO: no focal motor/sensory deficits SKIN:  no rashes or significant lesions;  LABORATORY DATA:  I have reviewed the data as listed    Component Value Date/Time   NA 133 (L) 07/18/2016 1112   NA 136 08/19/2014 1337   NA 135 (L) 02/08/2014 0844   K 4.1 07/18/2016 1112   K 4.0 02/08/2014 0844   CL 99 (L) 07/18/2016 1112   CL 105 02/08/2014 0844   CO2 26 07/18/2016 1112   CO2 21 02/08/2014 0844   GLUCOSE 146 (H) 07/18/2016 1112   GLUCOSE 157 (H) 02/08/2014 0844   BUN 18 07/18/2016 1112   BUN 32 (H) 08/19/2014 1337   BUN 15 02/08/2014 0844   CREATININE 1.29 (H) 07/18/2016 1112   CREATININE 1.71 (H) 05/16/2016 1105   CALCIUM 8.4 (L) 07/18/2016 1112   CALCIUM 8.8 02/08/2014 0844   PROT 7.7 01/06/2016 0905   PROT 7.9 12/06/2013 1453   ALBUMIN 2.6 (L) 01/06/2016 0905   ALBUMIN 2.8 (L) 12/06/2013 1453   AST 14 (L) 01/06/2016 0905   AST 16  12/06/2013 1453   ALT 5 (L) 01/06/2016 0905   ALT 12 (L) 12/06/2013 1453   ALKPHOS 77 01/06/2016 0905   ALKPHOS 82 12/06/2013 1453   BILITOT 0.6 01/06/2016 0905   BILITOT 0.3 12/06/2013 1453   GFRNONAA 40 (L) 07/18/2016 1112   GFRNONAA 41 (L) 02/08/2014 0844   GFRNONAA 32 (L) 07/30/2013 1508   GFRAA 46 (L) 07/18/2016 1112   GFRAA 49 (L) 02/08/2014 0844   GFRAA 37 (L) 07/30/2013 1508    No results found for: SPEP, UPEP  Lab Results  Component Value Date   WBC 6.0 07/18/2016   NEUTROABS 5.1 07/18/2016   HGB 9.4 (L) 07/18/2016   HCT 28.1 (L) 07/18/2016   MCV 84.2 07/18/2016   PLT 280 07/18/2016      Chemistry      Component Value Date/Time   NA 133 (L) 07/18/2016 1112   NA 136 08/19/2014 1337   NA 135 (L) 02/08/2014 0844   K 4.1 07/18/2016 1112   K 4.0 02/08/2014 0844   CL 99 (L) 07/18/2016 1112   CL 105 02/08/2014 0844   CO2 26 07/18/2016 1112   CO2 21 02/08/2014 0844   BUN 18 07/18/2016 1112   BUN 32 (H) 08/19/2014 1337   BUN 15 02/08/2014 0844   CREATININE 1.29 (H) 07/18/2016 1112   CREATININE 1.71 (H) 05/16/2016 1105      Component Value Date/Time   CALCIUM 8.4 (L) 07/18/2016 1112   CALCIUM 8.8 02/08/2014 0844   ALKPHOS 77 01/06/2016 0905   ALKPHOS 82 12/06/2013 1453   AST 14 (L) 01/06/2016 0905   AST 16 12/06/2013 1453   ALT 5 (L) 01/06/2016 0905   ALT 12 (L) 12/06/2013 1453   BILITOT 0.6 01/06/2016 0905   BILITOT 0.3 12/06/2013 1453        ASSESSMENT & PLAN:   Cancer of upper lobe of right lung ([place]) # LUNG CA- non-small cell lung stage IV; s/p Opdivo every 2 weeks x#4; PET scan MARCH 2017-right lower lobe lesion improving in size [currently 2.2 x 2.5 from  3.5 x3cm]; and the right lung continues to show extensive radiation changes.  Patient's treatments are currently on hold because of declining performance status.   # Clinically patient is stable; continue monitoring for now. No obvious progression of her lung cancer on a clinical basis. Continue  hospice care.  # Hemoglobin is 9.4/ stable; monitor for now.   # weight loss- Continue Boost/ensure/ megace.   # chronic kidney disease with Creatinine -1.29/improved.   # port flush- thru hospice RN.   #  follow up  2  Months/labs-hold tube.   Faythe Casa, NP 07/18/2016 1:44 PM   Jacquelin Hawking, NP 07/18/2016 1:44 PM

## 2016-07-18 NOTE — Assessment & Plan Note (Addendum)
#   LUNG CA- non-small cell lung stage IV; s/p Opdivo every 2 weeks x#4; PET scan MARCH 2017-right lower lobe lesion improving in size [currently 2.2 x 2.5 from 3.5 x3cm]; and the right lung continues to show extensive radiation changes.  Patient's treatments are currently on hold because of declining performance status.   # Clinically patient is stable; continue monitoring for now. No obvious progression of her lung cancer on a clinical basis. Continue hospice care.  # Hemoglobin is 9.4/ stable; monitor for now.   # weight loss- Continue Boost/ensure/ megace.   # chronic kidney disease with Creatinine -1.29/improved.   # port flush- thru hospice RN.   #  follow up  2  Months/labs-hold tube.

## 2016-07-20 ENCOUNTER — Other Ambulatory Visit: Payer: Self-pay | Admitting: *Deleted

## 2016-07-20 DIAGNOSIS — R059 Cough, unspecified: Secondary | ICD-10-CM

## 2016-07-20 DIAGNOSIS — R05 Cough: Secondary | ICD-10-CM

## 2016-07-20 DIAGNOSIS — N189 Chronic kidney disease, unspecified: Secondary | ICD-10-CM | POA: Diagnosis not present

## 2016-07-20 DIAGNOSIS — R600 Localized edema: Secondary | ICD-10-CM | POA: Diagnosis not present

## 2016-07-20 DIAGNOSIS — Z9981 Dependence on supplemental oxygen: Secondary | ICD-10-CM | POA: Diagnosis not present

## 2016-07-20 DIAGNOSIS — I129 Hypertensive chronic kidney disease with stage 1 through stage 4 chronic kidney disease, or unspecified chronic kidney disease: Secondary | ICD-10-CM | POA: Diagnosis not present

## 2016-07-20 DIAGNOSIS — C3411 Malignant neoplasm of upper lobe, right bronchus or lung: Secondary | ICD-10-CM

## 2016-07-20 DIAGNOSIS — I4891 Unspecified atrial fibrillation: Secondary | ICD-10-CM | POA: Diagnosis not present

## 2016-07-20 MED ORDER — GUAIFENESIN-CODEINE 100-10 MG/5ML PO SYRP: ORAL_SOLUTION | ORAL | 0 refills | Status: DC

## 2016-07-26 DIAGNOSIS — C3411 Malignant neoplasm of upper lobe, right bronchus or lung: Secondary | ICD-10-CM | POA: Diagnosis not present

## 2016-07-26 DIAGNOSIS — I129 Hypertensive chronic kidney disease with stage 1 through stage 4 chronic kidney disease, or unspecified chronic kidney disease: Secondary | ICD-10-CM | POA: Diagnosis not present

## 2016-07-26 DIAGNOSIS — Z9981 Dependence on supplemental oxygen: Secondary | ICD-10-CM | POA: Diagnosis not present

## 2016-07-26 DIAGNOSIS — N189 Chronic kidney disease, unspecified: Secondary | ICD-10-CM | POA: Diagnosis not present

## 2016-07-26 DIAGNOSIS — R600 Localized edema: Secondary | ICD-10-CM | POA: Diagnosis not present

## 2016-07-26 DIAGNOSIS — I4891 Unspecified atrial fibrillation: Secondary | ICD-10-CM | POA: Diagnosis not present

## 2016-07-27 DIAGNOSIS — Z9981 Dependence on supplemental oxygen: Secondary | ICD-10-CM | POA: Diagnosis not present

## 2016-07-27 DIAGNOSIS — R600 Localized edema: Secondary | ICD-10-CM | POA: Diagnosis not present

## 2016-07-27 DIAGNOSIS — I4891 Unspecified atrial fibrillation: Secondary | ICD-10-CM | POA: Diagnosis not present

## 2016-07-27 DIAGNOSIS — N189 Chronic kidney disease, unspecified: Secondary | ICD-10-CM | POA: Diagnosis not present

## 2016-07-27 DIAGNOSIS — I129 Hypertensive chronic kidney disease with stage 1 through stage 4 chronic kidney disease, or unspecified chronic kidney disease: Secondary | ICD-10-CM | POA: Diagnosis not present

## 2016-07-27 DIAGNOSIS — C3411 Malignant neoplasm of upper lobe, right bronchus or lung: Secondary | ICD-10-CM | POA: Diagnosis not present

## 2016-08-01 DIAGNOSIS — I129 Hypertensive chronic kidney disease with stage 1 through stage 4 chronic kidney disease, or unspecified chronic kidney disease: Secondary | ICD-10-CM | POA: Diagnosis not present

## 2016-08-01 DIAGNOSIS — N189 Chronic kidney disease, unspecified: Secondary | ICD-10-CM | POA: Diagnosis not present

## 2016-08-01 DIAGNOSIS — C3411 Malignant neoplasm of upper lobe, right bronchus or lung: Secondary | ICD-10-CM | POA: Diagnosis not present

## 2016-08-01 DIAGNOSIS — Z9981 Dependence on supplemental oxygen: Secondary | ICD-10-CM | POA: Diagnosis not present

## 2016-08-01 DIAGNOSIS — I4891 Unspecified atrial fibrillation: Secondary | ICD-10-CM | POA: Diagnosis not present

## 2016-08-01 DIAGNOSIS — R600 Localized edema: Secondary | ICD-10-CM | POA: Diagnosis not present

## 2016-08-02 DIAGNOSIS — N189 Chronic kidney disease, unspecified: Secondary | ICD-10-CM | POA: Diagnosis not present

## 2016-08-02 DIAGNOSIS — Z9981 Dependence on supplemental oxygen: Secondary | ICD-10-CM | POA: Diagnosis not present

## 2016-08-02 DIAGNOSIS — C3411 Malignant neoplasm of upper lobe, right bronchus or lung: Secondary | ICD-10-CM | POA: Diagnosis not present

## 2016-08-02 DIAGNOSIS — R600 Localized edema: Secondary | ICD-10-CM | POA: Diagnosis not present

## 2016-08-02 DIAGNOSIS — I4891 Unspecified atrial fibrillation: Secondary | ICD-10-CM | POA: Diagnosis not present

## 2016-08-02 DIAGNOSIS — I129 Hypertensive chronic kidney disease with stage 1 through stage 4 chronic kidney disease, or unspecified chronic kidney disease: Secondary | ICD-10-CM | POA: Diagnosis not present

## 2016-08-03 DIAGNOSIS — R600 Localized edema: Secondary | ICD-10-CM | POA: Diagnosis not present

## 2016-08-03 DIAGNOSIS — N189 Chronic kidney disease, unspecified: Secondary | ICD-10-CM | POA: Diagnosis not present

## 2016-08-03 DIAGNOSIS — D649 Anemia, unspecified: Secondary | ICD-10-CM | POA: Diagnosis not present

## 2016-08-03 DIAGNOSIS — Z87891 Personal history of nicotine dependence: Secondary | ICD-10-CM | POA: Diagnosis not present

## 2016-08-03 DIAGNOSIS — Z9981 Dependence on supplemental oxygen: Secondary | ICD-10-CM | POA: Diagnosis not present

## 2016-08-03 DIAGNOSIS — I129 Hypertensive chronic kidney disease with stage 1 through stage 4 chronic kidney disease, or unspecified chronic kidney disease: Secondary | ICD-10-CM | POA: Diagnosis not present

## 2016-08-03 DIAGNOSIS — C3411 Malignant neoplasm of upper lobe, right bronchus or lung: Secondary | ICD-10-CM | POA: Diagnosis not present

## 2016-08-03 DIAGNOSIS — R634 Abnormal weight loss: Secondary | ICD-10-CM | POA: Diagnosis not present

## 2016-08-03 DIAGNOSIS — Z452 Encounter for adjustment and management of vascular access device: Secondary | ICD-10-CM | POA: Diagnosis not present

## 2016-08-03 DIAGNOSIS — E039 Hypothyroidism, unspecified: Secondary | ICD-10-CM | POA: Diagnosis not present

## 2016-08-03 DIAGNOSIS — I4891 Unspecified atrial fibrillation: Secondary | ICD-10-CM | POA: Diagnosis not present

## 2016-08-03 DIAGNOSIS — R630 Anorexia: Secondary | ICD-10-CM | POA: Diagnosis not present

## 2016-08-09 ENCOUNTER — Other Ambulatory Visit: Payer: Self-pay | Admitting: Internal Medicine

## 2016-08-10 DIAGNOSIS — Z9981 Dependence on supplemental oxygen: Secondary | ICD-10-CM | POA: Diagnosis not present

## 2016-08-10 DIAGNOSIS — I129 Hypertensive chronic kidney disease with stage 1 through stage 4 chronic kidney disease, or unspecified chronic kidney disease: Secondary | ICD-10-CM | POA: Diagnosis not present

## 2016-08-10 DIAGNOSIS — N189 Chronic kidney disease, unspecified: Secondary | ICD-10-CM | POA: Diagnosis not present

## 2016-08-10 DIAGNOSIS — C3411 Malignant neoplasm of upper lobe, right bronchus or lung: Secondary | ICD-10-CM | POA: Diagnosis not present

## 2016-08-10 DIAGNOSIS — I4891 Unspecified atrial fibrillation: Secondary | ICD-10-CM | POA: Diagnosis not present

## 2016-08-10 DIAGNOSIS — R600 Localized edema: Secondary | ICD-10-CM | POA: Diagnosis not present

## 2016-08-15 DIAGNOSIS — R600 Localized edema: Secondary | ICD-10-CM | POA: Diagnosis not present

## 2016-08-15 DIAGNOSIS — I4891 Unspecified atrial fibrillation: Secondary | ICD-10-CM | POA: Diagnosis not present

## 2016-08-15 DIAGNOSIS — Z9981 Dependence on supplemental oxygen: Secondary | ICD-10-CM | POA: Diagnosis not present

## 2016-08-15 DIAGNOSIS — I129 Hypertensive chronic kidney disease with stage 1 through stage 4 chronic kidney disease, or unspecified chronic kidney disease: Secondary | ICD-10-CM | POA: Diagnosis not present

## 2016-08-15 DIAGNOSIS — C3411 Malignant neoplasm of upper lobe, right bronchus or lung: Secondary | ICD-10-CM | POA: Diagnosis not present

## 2016-08-15 DIAGNOSIS — N189 Chronic kidney disease, unspecified: Secondary | ICD-10-CM | POA: Diagnosis not present

## 2016-08-17 ENCOUNTER — Other Ambulatory Visit: Payer: Self-pay | Admitting: Internal Medicine

## 2016-08-17 DIAGNOSIS — R600 Localized edema: Secondary | ICD-10-CM | POA: Diagnosis not present

## 2016-08-17 DIAGNOSIS — R05 Cough: Secondary | ICD-10-CM

## 2016-08-17 DIAGNOSIS — N189 Chronic kidney disease, unspecified: Secondary | ICD-10-CM | POA: Diagnosis not present

## 2016-08-17 DIAGNOSIS — C3411 Malignant neoplasm of upper lobe, right bronchus or lung: Secondary | ICD-10-CM

## 2016-08-17 DIAGNOSIS — I4891 Unspecified atrial fibrillation: Secondary | ICD-10-CM | POA: Diagnosis not present

## 2016-08-17 DIAGNOSIS — R059 Cough, unspecified: Secondary | ICD-10-CM

## 2016-08-17 DIAGNOSIS — I129 Hypertensive chronic kidney disease with stage 1 through stage 4 chronic kidney disease, or unspecified chronic kidney disease: Secondary | ICD-10-CM | POA: Diagnosis not present

## 2016-08-17 DIAGNOSIS — Z9981 Dependence on supplemental oxygen: Secondary | ICD-10-CM | POA: Diagnosis not present

## 2016-08-23 DIAGNOSIS — Z9981 Dependence on supplemental oxygen: Secondary | ICD-10-CM | POA: Diagnosis not present

## 2016-08-23 DIAGNOSIS — R600 Localized edema: Secondary | ICD-10-CM | POA: Diagnosis not present

## 2016-08-23 DIAGNOSIS — N189 Chronic kidney disease, unspecified: Secondary | ICD-10-CM | POA: Diagnosis not present

## 2016-08-23 DIAGNOSIS — I4891 Unspecified atrial fibrillation: Secondary | ICD-10-CM | POA: Diagnosis not present

## 2016-08-23 DIAGNOSIS — C3411 Malignant neoplasm of upper lobe, right bronchus or lung: Secondary | ICD-10-CM | POA: Diagnosis not present

## 2016-08-23 DIAGNOSIS — I129 Hypertensive chronic kidney disease with stage 1 through stage 4 chronic kidney disease, or unspecified chronic kidney disease: Secondary | ICD-10-CM | POA: Diagnosis not present

## 2016-08-24 DIAGNOSIS — I129 Hypertensive chronic kidney disease with stage 1 through stage 4 chronic kidney disease, or unspecified chronic kidney disease: Secondary | ICD-10-CM | POA: Diagnosis not present

## 2016-08-24 DIAGNOSIS — C3411 Malignant neoplasm of upper lobe, right bronchus or lung: Secondary | ICD-10-CM | POA: Diagnosis not present

## 2016-08-24 DIAGNOSIS — I4891 Unspecified atrial fibrillation: Secondary | ICD-10-CM | POA: Diagnosis not present

## 2016-08-24 DIAGNOSIS — R600 Localized edema: Secondary | ICD-10-CM | POA: Diagnosis not present

## 2016-08-24 DIAGNOSIS — Z9981 Dependence on supplemental oxygen: Secondary | ICD-10-CM | POA: Diagnosis not present

## 2016-08-24 DIAGNOSIS — N189 Chronic kidney disease, unspecified: Secondary | ICD-10-CM | POA: Diagnosis not present

## 2016-08-29 ENCOUNTER — Other Ambulatory Visit: Payer: Self-pay | Admitting: Internal Medicine

## 2016-08-29 DIAGNOSIS — R05 Cough: Secondary | ICD-10-CM

## 2016-08-29 DIAGNOSIS — I129 Hypertensive chronic kidney disease with stage 1 through stage 4 chronic kidney disease, or unspecified chronic kidney disease: Secondary | ICD-10-CM | POA: Diagnosis not present

## 2016-08-29 DIAGNOSIS — R059 Cough, unspecified: Secondary | ICD-10-CM

## 2016-08-29 DIAGNOSIS — Z9981 Dependence on supplemental oxygen: Secondary | ICD-10-CM | POA: Diagnosis not present

## 2016-08-29 DIAGNOSIS — C3411 Malignant neoplasm of upper lobe, right bronchus or lung: Secondary | ICD-10-CM | POA: Diagnosis not present

## 2016-08-29 DIAGNOSIS — N189 Chronic kidney disease, unspecified: Secondary | ICD-10-CM | POA: Diagnosis not present

## 2016-08-29 DIAGNOSIS — I4891 Unspecified atrial fibrillation: Secondary | ICD-10-CM | POA: Diagnosis not present

## 2016-08-29 DIAGNOSIS — R600 Localized edema: Secondary | ICD-10-CM | POA: Diagnosis not present

## 2016-08-31 DIAGNOSIS — I129 Hypertensive chronic kidney disease with stage 1 through stage 4 chronic kidney disease, or unspecified chronic kidney disease: Secondary | ICD-10-CM | POA: Diagnosis not present

## 2016-08-31 DIAGNOSIS — N189 Chronic kidney disease, unspecified: Secondary | ICD-10-CM | POA: Diagnosis not present

## 2016-08-31 DIAGNOSIS — I4891 Unspecified atrial fibrillation: Secondary | ICD-10-CM | POA: Diagnosis not present

## 2016-08-31 DIAGNOSIS — Z9981 Dependence on supplemental oxygen: Secondary | ICD-10-CM | POA: Diagnosis not present

## 2016-08-31 DIAGNOSIS — R600 Localized edema: Secondary | ICD-10-CM | POA: Diagnosis not present

## 2016-08-31 DIAGNOSIS — C3411 Malignant neoplasm of upper lobe, right bronchus or lung: Secondary | ICD-10-CM | POA: Diagnosis not present

## 2016-09-03 DIAGNOSIS — I4891 Unspecified atrial fibrillation: Secondary | ICD-10-CM | POA: Diagnosis not present

## 2016-09-03 DIAGNOSIS — D649 Anemia, unspecified: Secondary | ICD-10-CM | POA: Diagnosis not present

## 2016-09-03 DIAGNOSIS — E039 Hypothyroidism, unspecified: Secondary | ICD-10-CM | POA: Diagnosis not present

## 2016-09-03 DIAGNOSIS — R630 Anorexia: Secondary | ICD-10-CM | POA: Diagnosis not present

## 2016-09-03 DIAGNOSIS — I129 Hypertensive chronic kidney disease with stage 1 through stage 4 chronic kidney disease, or unspecified chronic kidney disease: Secondary | ICD-10-CM | POA: Diagnosis not present

## 2016-09-03 DIAGNOSIS — Z87891 Personal history of nicotine dependence: Secondary | ICD-10-CM | POA: Diagnosis not present

## 2016-09-03 DIAGNOSIS — Z452 Encounter for adjustment and management of vascular access device: Secondary | ICD-10-CM | POA: Diagnosis not present

## 2016-09-03 DIAGNOSIS — R600 Localized edema: Secondary | ICD-10-CM | POA: Diagnosis not present

## 2016-09-03 DIAGNOSIS — C3411 Malignant neoplasm of upper lobe, right bronchus or lung: Secondary | ICD-10-CM | POA: Diagnosis not present

## 2016-09-03 DIAGNOSIS — Z9981 Dependence on supplemental oxygen: Secondary | ICD-10-CM | POA: Diagnosis not present

## 2016-09-03 DIAGNOSIS — R634 Abnormal weight loss: Secondary | ICD-10-CM | POA: Diagnosis not present

## 2016-09-03 DIAGNOSIS — N189 Chronic kidney disease, unspecified: Secondary | ICD-10-CM | POA: Diagnosis not present

## 2016-09-08 ENCOUNTER — Other Ambulatory Visit: Payer: Self-pay | Admitting: Internal Medicine

## 2016-09-08 DIAGNOSIS — N189 Chronic kidney disease, unspecified: Secondary | ICD-10-CM | POA: Diagnosis not present

## 2016-09-08 DIAGNOSIS — R600 Localized edema: Secondary | ICD-10-CM | POA: Diagnosis not present

## 2016-09-08 DIAGNOSIS — Z9981 Dependence on supplemental oxygen: Secondary | ICD-10-CM | POA: Diagnosis not present

## 2016-09-08 DIAGNOSIS — I129 Hypertensive chronic kidney disease with stage 1 through stage 4 chronic kidney disease, or unspecified chronic kidney disease: Secondary | ICD-10-CM | POA: Diagnosis not present

## 2016-09-08 DIAGNOSIS — C3411 Malignant neoplasm of upper lobe, right bronchus or lung: Secondary | ICD-10-CM | POA: Diagnosis not present

## 2016-09-08 DIAGNOSIS — I4891 Unspecified atrial fibrillation: Secondary | ICD-10-CM | POA: Diagnosis not present

## 2016-09-13 DIAGNOSIS — R600 Localized edema: Secondary | ICD-10-CM | POA: Diagnosis not present

## 2016-09-13 DIAGNOSIS — N189 Chronic kidney disease, unspecified: Secondary | ICD-10-CM | POA: Diagnosis not present

## 2016-09-13 DIAGNOSIS — C3411 Malignant neoplasm of upper lobe, right bronchus or lung: Secondary | ICD-10-CM | POA: Diagnosis not present

## 2016-09-13 DIAGNOSIS — I129 Hypertensive chronic kidney disease with stage 1 through stage 4 chronic kidney disease, or unspecified chronic kidney disease: Secondary | ICD-10-CM | POA: Diagnosis not present

## 2016-09-13 DIAGNOSIS — Z9981 Dependence on supplemental oxygen: Secondary | ICD-10-CM | POA: Diagnosis not present

## 2016-09-13 DIAGNOSIS — I4891 Unspecified atrial fibrillation: Secondary | ICD-10-CM | POA: Diagnosis not present

## 2016-09-14 DIAGNOSIS — C3411 Malignant neoplasm of upper lobe, right bronchus or lung: Secondary | ICD-10-CM | POA: Diagnosis not present

## 2016-09-14 DIAGNOSIS — R600 Localized edema: Secondary | ICD-10-CM | POA: Diagnosis not present

## 2016-09-14 DIAGNOSIS — I4891 Unspecified atrial fibrillation: Secondary | ICD-10-CM | POA: Diagnosis not present

## 2016-09-14 DIAGNOSIS — Z9981 Dependence on supplemental oxygen: Secondary | ICD-10-CM | POA: Diagnosis not present

## 2016-09-14 DIAGNOSIS — N189 Chronic kidney disease, unspecified: Secondary | ICD-10-CM | POA: Diagnosis not present

## 2016-09-14 DIAGNOSIS — I129 Hypertensive chronic kidney disease with stage 1 through stage 4 chronic kidney disease, or unspecified chronic kidney disease: Secondary | ICD-10-CM | POA: Diagnosis not present

## 2016-09-15 DIAGNOSIS — C3411 Malignant neoplasm of upper lobe, right bronchus or lung: Secondary | ICD-10-CM | POA: Diagnosis not present

## 2016-09-15 DIAGNOSIS — R600 Localized edema: Secondary | ICD-10-CM | POA: Diagnosis not present

## 2016-09-15 DIAGNOSIS — Z9981 Dependence on supplemental oxygen: Secondary | ICD-10-CM | POA: Diagnosis not present

## 2016-09-15 DIAGNOSIS — N189 Chronic kidney disease, unspecified: Secondary | ICD-10-CM | POA: Diagnosis not present

## 2016-09-15 DIAGNOSIS — I129 Hypertensive chronic kidney disease with stage 1 through stage 4 chronic kidney disease, or unspecified chronic kidney disease: Secondary | ICD-10-CM | POA: Diagnosis not present

## 2016-09-15 DIAGNOSIS — I4891 Unspecified atrial fibrillation: Secondary | ICD-10-CM | POA: Diagnosis not present

## 2016-09-18 ENCOUNTER — Other Ambulatory Visit: Payer: Self-pay | Admitting: Family Medicine

## 2016-09-19 ENCOUNTER — Inpatient Hospital Stay: Attending: Internal Medicine | Admitting: Internal Medicine

## 2016-09-19 ENCOUNTER — Other Ambulatory Visit: Payer: Self-pay | Admitting: *Deleted

## 2016-09-19 ENCOUNTER — Inpatient Hospital Stay

## 2016-09-19 VITALS — BP 132/60 | HR 103 | Temp 97.8°F | Resp 16 | Ht <= 58 in | Wt 127.0 lb

## 2016-09-19 DIAGNOSIS — E039 Hypothyroidism, unspecified: Secondary | ICD-10-CM | POA: Diagnosis not present

## 2016-09-19 DIAGNOSIS — Z809 Family history of malignant neoplasm, unspecified: Secondary | ICD-10-CM | POA: Diagnosis not present

## 2016-09-19 DIAGNOSIS — D649 Anemia, unspecified: Secondary | ICD-10-CM | POA: Insufficient documentation

## 2016-09-19 DIAGNOSIS — Z9221 Personal history of antineoplastic chemotherapy: Secondary | ICD-10-CM | POA: Diagnosis not present

## 2016-09-19 DIAGNOSIS — Z87891 Personal history of nicotine dependence: Secondary | ICD-10-CM | POA: Diagnosis not present

## 2016-09-19 DIAGNOSIS — R630 Anorexia: Secondary | ICD-10-CM | POA: Diagnosis not present

## 2016-09-19 DIAGNOSIS — Z923 Personal history of irradiation: Secondary | ICD-10-CM | POA: Insufficient documentation

## 2016-09-19 DIAGNOSIS — R634 Abnormal weight loss: Secondary | ICD-10-CM | POA: Insufficient documentation

## 2016-09-19 DIAGNOSIS — R5383 Other fatigue: Secondary | ICD-10-CM | POA: Insufficient documentation

## 2016-09-19 DIAGNOSIS — N184 Chronic kidney disease, stage 4 (severe): Secondary | ICD-10-CM | POA: Insufficient documentation

## 2016-09-19 DIAGNOSIS — I4891 Unspecified atrial fibrillation: Secondary | ICD-10-CM | POA: Diagnosis not present

## 2016-09-19 DIAGNOSIS — R05 Cough: Secondary | ICD-10-CM | POA: Insufficient documentation

## 2016-09-19 DIAGNOSIS — Z800 Family history of malignant neoplasm of digestive organs: Secondary | ICD-10-CM | POA: Insufficient documentation

## 2016-09-19 DIAGNOSIS — Z9981 Dependence on supplemental oxygen: Secondary | ICD-10-CM | POA: Insufficient documentation

## 2016-09-19 DIAGNOSIS — I129 Hypertensive chronic kidney disease with stage 1 through stage 4 chronic kidney disease, or unspecified chronic kidney disease: Secondary | ICD-10-CM | POA: Diagnosis not present

## 2016-09-19 DIAGNOSIS — D509 Iron deficiency anemia, unspecified: Secondary | ICD-10-CM

## 2016-09-19 DIAGNOSIS — C3411 Malignant neoplasm of upper lobe, right bronchus or lung: Secondary | ICD-10-CM | POA: Diagnosis not present

## 2016-09-19 DIAGNOSIS — R0602 Shortness of breath: Secondary | ICD-10-CM | POA: Insufficient documentation

## 2016-09-19 DIAGNOSIS — Z79899 Other long term (current) drug therapy: Secondary | ICD-10-CM | POA: Insufficient documentation

## 2016-09-19 LAB — CBC WITH DIFFERENTIAL/PLATELET
Basophils Absolute: 0 10*3/uL (ref 0–0.1)
Basophils Relative: 1 %
EOS ABS: 0.1 10*3/uL (ref 0–0.7)
EOS PCT: 3 %
HCT: 20.1 % — ABNORMAL LOW (ref 35.0–47.0)
Hemoglobin: 6.6 g/dL — ABNORMAL LOW (ref 12.0–16.0)
LYMPHS ABS: 0.5 10*3/uL — AB (ref 1.0–3.6)
Lymphocytes Relative: 9 %
MCH: 25.7 pg — AB (ref 26.0–34.0)
MCHC: 32.8 g/dL (ref 32.0–36.0)
MCV: 78.3 fL — ABNORMAL LOW (ref 80.0–100.0)
MONO ABS: 0.5 10*3/uL (ref 0.2–0.9)
Monocytes Relative: 9 %
Neutro Abs: 3.9 10*3/uL (ref 1.4–6.5)
Neutrophils Relative %: 78 %
PLATELETS: 352 10*3/uL (ref 150–440)
RBC: 2.57 MIL/uL — AB (ref 3.80–5.20)
RDW: 18.9 % — AB (ref 11.5–14.5)
WBC: 5 10*3/uL (ref 3.6–11.0)

## 2016-09-19 LAB — BASIC METABOLIC PANEL
Anion gap: 10 (ref 5–15)
BUN: 23 mg/dL — AB (ref 6–20)
CALCIUM: 8.2 mg/dL — AB (ref 8.9–10.3)
CO2: 24 mmol/L (ref 22–32)
CREATININE: 1.32 mg/dL — AB (ref 0.44–1.00)
Chloride: 95 mmol/L — ABNORMAL LOW (ref 101–111)
GFR calc Af Amer: 45 mL/min — ABNORMAL LOW (ref >60)
GFR, EST NON AFRICAN AMERICAN: 39 mL/min — AB (ref >60)
Glucose, Bld: 204 mg/dL — ABNORMAL HIGH (ref 65–99)
POTASSIUM: 3.6 mmol/L (ref 3.5–5.1)
SODIUM: 129 mmol/L — AB (ref 135–145)

## 2016-09-19 LAB — SAMPLE TO BLOOD BANK

## 2016-09-19 LAB — PREPARE RBC (CROSSMATCH)

## 2016-09-19 NOTE — Progress Notes (Addendum)
[place] OFFICE PROGRESS NOTE  Patient Care Team: Katrina Kramer, Katrina Anis, MD as PCP - General (Family Medicine) Katrina Silvas, MD (Gastroenterology) Katrina Sickle, MD as Consulting Physician (Oncology) Katrina Dana, MD as Consulting Physician (Nephrology)   SUMMARY OF ONCOLOGIC HISTORY:  Oncology History   1. 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 CT-guided biopsy on 02/10/11 - insufficient for definite diagnosis. Rare group of atypical cells. Got chemotherapy with Paclitaxel/Carboplatin (started on 02/14/11, completed cycle 6 day 8 on 08/22/11). Also got concurrent radiation. 01/18/12 - Underwent Preoperative bronchoscopy with bronchoalveolar lavage right upper lobe. Right thoracoscopy with pleural biopsy and talc pleurodesis. Negative for malignancy. PET scan on 03/29/12 IMPRESSION: Multifocal pleural activity in the right chest suggesting pleural tumor spread. Activity is also noted in the right hilar region suggesting persistent tumor in this region. April 04, 2012 - VERISTRAT test reported as 'GOOD'. Started Tarceva on 05/17/12. July 2014 - declining performance status, unable to tolerate low dose Tarceva, was on home hospice which was discontinued since patient doing steady.     Lung cancer [facility])    Initial Diagnosis    Lung cancer ([place])       Cancer of upper lobe of right lung ([place])   07/27/2015 Initial Diagnosis    Cancer of upper lobe of right lung ([place])        INTERVAL HISTORY:  A very pleasant 76 year old female patient with above history of recurrent/metastatic non-small cell lung cancer status  Currently in hospice is here for follow-up.  Patient has mild cough that is unchanged. She has stable chronic shortness of breath with exertion. She continues to use a walker around the house and with  short distances. She uses a wheelchair for longer distances. She continues to wear oxygen. No fevers or chills. Husband reports that she has been somewhat more fatigued recently and more sob.   REVIEW OF SYSTEMS:  A complete 10 point review of system is done which is negative except mentioned above/history of present illness.   PAST MEDICAL HISTORY :  Past Medical History:  Diagnosis Date  . Anemia   . Anemia   . Atrial fibrillation ([place])    Dr. Humphrey Rolls, cardiologist  . CKD (chronic kidney disease) stage 4, GFR 15-29 ml/min ([place]) 05/01/2015  . Hypertension   . Hypothyroidism   . Lung cancer ([place])   . Lung cancer ([place])     PAST SURGICAL HISTORY :   Past Surgical History:  Procedure Laterality Date  . COLONOSCOPY N/A 10/11/2014   Procedure: COLONOSCOPY;  Surgeon: Katrina Silvas, MD;  Location: [facility] ENDOSCOPY;  Service: Endoscopy;  Laterality: N/A;  . ESOPHAGOGASTRODUODENOSCOPY N/A 10/10/2014   Procedure: ESOPHAGOGASTRODUODENOSCOPY (EGD);  Surgeon: Katrina Silvas, MD;  Location: [facility] ENDOSCOPY;  Service: Endoscopy;  Laterality: N/A;  . UPPER GASTROINTESTINAL ENDOSCOPY  02/24/14    FAMILY HISTORY :   Family History  Problem Relation Age of Onset  . Stomach cancer Mother   . Cancer Mother        unsure where but states "it spead all over"  . Alzheimer's disease Father   . Varicose Veins Daughter   . Diabetes Neg Hx   . Heart disease Neg Hx   . Hypertension Neg Hx   . Stroke Neg Hx   . COPD Neg Hx     SOCIAL HISTORY:   Social History  Substance  Use Topics  . Smoking status: Former Smoker    Packs/day: 0.75    Years: 30.00    Types: Cigarettes    Quit date: 01/03/2005  . Smokeless tobacco: Never Used  . Alcohol use No    ALLERGIES:  has No Known Allergies.  MEDICATIONS:  Current Outpatient Prescriptions  Medication Sig Dispense Refill  . albuterol (PROVENTIL) (2.5 MG/3ML) 0.083% nebulizer solution Inhale 3 mLs (2.5 mg total) into the lungs every 4 (four) hours as  needed for wheezing or shortness of breath. 360 mL 3  . benzonatate (TESSALON) 100 MG capsule TAKE 1 CAPSULE BY MOUTH 3 TIMES DAILY ASNEEDED COUGH 90 capsule 1  . Calcium Carb-Cholecalciferol (CALCIUM 600/VITAMIN D3 PO) Take 2 tablets by mouth daily.     . carvedilol (COREG) 12.5 MG tablet Take 12.5 mg by mouth daily.    . digoxin (LANOXIN) 0.125 MG tablet Take 0.125 mg by mouth 3 (three) times a week. On Mondays, Wednesdays and Fridays    . ferrous gluconate (FERATE) 240 (27 FE) MG tablet Take 240 mg by mouth 3 (three) times daily with meals.    . isosorbide mononitrate (IMDUR) 30 MG 24 hr tablet Take 30 mg by mouth daily.    Marland Kitchen levothyroxine (SYNTHROID, LEVOTHROID) 50 MCG tablet TAKE 1 TABLET BY MOUTH  DAILY FOR 5 DAYS EACH WEEK  AND TAKE ONE-HALF TABLET BY MOUTH DAILY FOR 2 DAYS EACH WEEK 78 tablet 0  . loratadine (CLARITIN) 10 MG tablet TAKE 1 TABLET BY MOUTH ONCE DAILY 30 tablet 1  . megestrol (MEGACE) 40 MG/ML suspension TAKE 15 MLS BY MOUTH DAILY 240 mL 1  . MUCINEX 600 MG 12 hr tablet TAKE 1 TABLET BY MOUTH TWICE DAILY 60 tablet 3  . pantoprazole (PROTONIX) 40 MG tablet Take 40 mg by mouth daily.    . potassium chloride SA (K-DUR,KLOR-CON) 20 MEQ tablet Take 20 mEq by mouth daily.     . Pseudoephedrine-DM-GG (ROBITUSSIN CF PO) Take 5 mLs by mouth as needed (cough).    . torsemide (DEMADEX) 10 MG tablet Take 10 mg by mouth daily.      No current facility-administered medications for this visit.    Facility-Administered Medications Ordered in Other Visits  Medication Dose Route Frequency Provider Last Rate Last Dose  . 0.9 %  sodium chloride infusion   Intravenous Continuous Leia Alf, MD 20 mL/hr at 07/04/14 1150    . heparin lock flush 100 unit/mL  500 Units Intravenous Once Leia Alf, MD      . heparin lock flush 100 unit/mL  500 Units Intravenous Once Leia Alf, MD      . sodium chloride 0.9 % injection 10 mL  10 mL Intravenous PRN Leia Alf, MD      . sodium  chloride 0.9 % injection 10 mL  10 mL Intravenous PRN Leia Alf, MD   10 mL at 07/04/14 1145    PHYSICAL EXAMINATION: ECOG PERFORMANCE STATUS: 3 - Symptomatic, >50% confined to bed  BP 132/60 (BP Location: Left Arm, Patient Position: Sitting)   Pulse (!) 103   Temp 97.8 F (36.6 C) (Tympanic)   Resp 16   Ht 4\' 9"  (1.448 m)   Wt 127 lb (57.6 kg)   BMI 27.48 kg/m   Filed Weights   09/19/16 1137  Weight: 127 lb (57.6 kg)    GENERAL:  Alert, no distress and comfortable. Thin built elderly frail woman in wheelchair accompanied by her husband. Positive for pallor.   OROPHARYNX: no  thrush or ulceration; poor dentition  NECK: supple, no masses felt LYMPH:  no palpable lymphadenopathy in the cervical, axillary or inguinal regions LUNGS: Decreased breath sounds bilaterally at the bases. Scattered wheeze noted or crackles.  HEART/CVS: regular rate & rhythm and no murmurs; No lower extremity edema ABDOMEN:abdomen soft, non-tender and normal bowel sounds Musculoskeletal:no cyanosis of digits and no clubbing  PSYCH: alert & oriented x 3 with fluent speech. Tearful at times.  NEURO: no focal motor/sensory deficits SKIN:  no rashes or significant lesions.  LABORATORY DATA:  I have reviewed the data as listed    Component Value Date/Time   NA 129 (L) 09/19/2016 1113   NA 136 08/19/2014 1337   NA 135 (L) 02/08/2014 0844   K 3.6 09/19/2016 1113   K 4.0 02/08/2014 0844   CL 95 (L) 09/19/2016 1113   CL 105 02/08/2014 0844   CO2 24 09/19/2016 1113   CO2 21 02/08/2014 0844   GLUCOSE 204 (H) 09/19/2016 1113   GLUCOSE 157 (H) 02/08/2014 0844   BUN 23 (H) 09/19/2016 1113   BUN 32 (H) 08/19/2014 1337   BUN 15 02/08/2014 0844   CREATININE 1.32 (H) 09/19/2016 1113   CREATININE 1.71 (H) 05/16/2016 1105   CALCIUM 8.2 (L) 09/19/2016 1113   CALCIUM 8.8 02/08/2014 0844   PROT 7.7 01/06/2016 0905   PROT 7.9 12/06/2013 1453   ALBUMIN 2.6 (L) 01/06/2016 0905   ALBUMIN 2.8 (L) 12/06/2013  1453   AST 14 (L) 01/06/2016 0905   AST 16 12/06/2013 1453   ALT 5 (L) 01/06/2016 0905   ALT 12 (L) 12/06/2013 1453   ALKPHOS 77 01/06/2016 0905   ALKPHOS 82 12/06/2013 1453   BILITOT 0.6 01/06/2016 0905   BILITOT 0.3 12/06/2013 1453   GFRNONAA 39 (L) 09/19/2016 1113   GFRNONAA 41 (L) 02/08/2014 0844   GFRNONAA 32 (L) 07/30/2013 1508   GFRAA 45 (L) 09/19/2016 1113   GFRAA 49 (L) 02/08/2014 0844   GFRAA 37 (L) 07/30/2013 1508   No results found for: SPEP, UPEP  Lab Results  Component Value Date   WBC 5.0 09/19/2016   NEUTROABS 3.9 09/19/2016   HGB 6.6 (L) 09/19/2016   HCT 20.1 (L) 09/19/2016   MCV 78.3 (L) 09/19/2016   PLT 352 09/19/2016      Chemistry      Component Value Date/Time   NA 129 (L) 09/19/2016 1113   NA 136 08/19/2014 1337   NA 135 (L) 02/08/2014 0844   K 3.6 09/19/2016 1113   K 4.0 02/08/2014 0844   CL 95 (L) 09/19/2016 1113   CL 105 02/08/2014 0844   CO2 24 09/19/2016 1113   CO2 21 02/08/2014 0844   BUN 23 (H) 09/19/2016 1113   BUN 32 (H) 08/19/2014 1337   BUN 15 02/08/2014 0844   CREATININE 1.32 (H) 09/19/2016 1113   CREATININE 1.71 (H) 05/16/2016 1105      Component Value Date/Time   CALCIUM 8.2 (L) 09/19/2016 1113   CALCIUM 8.8 02/08/2014 0844   ALKPHOS 77 01/06/2016 0905   ALKPHOS 82 12/06/2013 1453   AST 14 (L) 01/06/2016 0905   AST 16 12/06/2013 1453   ALT 5 (L) 01/06/2016 0905   ALT 12 (L) 12/06/2013 1453   BILITOT 0.6 01/06/2016 0905   BILITOT 0.3 12/06/2013 1453      ASSESSMENT & PLAN:   Cancer of upper lobe of right lung ([place]) # Lung Cancer- non-small cell lung stage IV; s/p Opdivo q2 weeks x#4.  PET scan 03/2015 showed improvement of right lower lobe lesion (2.2 x 2.5 compared to 3.5 x 3cm) with extension radiation changes in right lung. Patient's treatments were held due to declining performance status. Patient is clinically stable with no obvious progression clinically. Hospice has followed for approximately 2 years. Continue  with Hospice.   # Anemia - Hemoglobin today is 6.6. Will plan for 2 units of pRBCs today or tomorrow.   #weight loss & decreased appetite- continue with boost/ensure/megace  #CKD- creatinine today is 1.32. I suspect that this may be related to hydration status as her sodium is 130. She takes demadex. Encouraged oral fluids and adequate hydration. Could consider gentle fluid rehydration later if needed.   #port flush- performed by Hospice RN. Proceed with as scheduled.   rtc in 2 months for labs, see MD, and consideration for blood transfusion.   Katrina Pair G. Zenia Resides, NP 09/19/16 12:09 PM    Katrina Au, NP 09/19/2016 1:40 PM

## 2016-09-19 NOTE — Progress Notes (Signed)
RN [facility], Per Mitzi, last port 08/10/16. Hospice nurse stated that the next port flush will be due on Wednesday 9/19.

## 2016-09-19 NOTE — Assessment & Plan Note (Addendum)
#   Lung Cancer- non-small cell lung stage IV; s/p Opdivo q2 weeks x#4. PET scan 03/2015 showed improvement of right lower lobe lesion (2.2 x 2.5 compared to 3.5 x 3cm) with extension radiation changes in right lung. Patient's treatments were held due to declining performance status. Patient is clinically stable with no obvious progression clinically. Hospice has followed for approximately 2 years. Continue with Hospice.   # Anemia - Hemoglobin today is 6.6. Will plan for 2 units of pRBCs today or tomorrow.   #weight loss & decreased appetite- continue with boost/ensure/megace  #CKD- creatinine today is 1.32. I suspect that this may be related to hydration status as her sodium is 130. She takes demadex. Encouraged oral fluids and adequate hydration. Could consider gentle fluid rehydration later if needed.   #port flush- performed by Hospice RN. Proceed with as scheduled.   rtc in 2 months for labs, see MD, and consideration for blood transfusion.

## 2016-09-20 ENCOUNTER — Inpatient Hospital Stay

## 2016-09-20 DIAGNOSIS — C3411 Malignant neoplasm of upper lobe, right bronchus or lung: Secondary | ICD-10-CM

## 2016-09-20 DIAGNOSIS — D649 Anemia, unspecified: Secondary | ICD-10-CM

## 2016-09-20 DIAGNOSIS — D509 Iron deficiency anemia, unspecified: Secondary | ICD-10-CM

## 2016-09-20 MED ORDER — HEPARIN SOD (PORK) LOCK FLUSH 100 UNIT/ML IV SOLN
500.0000 [IU] | Freq: Once | INTRAVENOUS | Status: AC
  Administered 2016-09-20: 500 [IU] via INTRAVENOUS
  Filled 2016-09-20: qty 5

## 2016-09-20 MED ORDER — DIPHENHYDRAMINE HCL 25 MG PO CAPS
25.0000 mg | ORAL_CAPSULE | Freq: Once | ORAL | Status: AC
  Administered 2016-09-20: 25 mg via ORAL
  Filled 2016-09-20: qty 1

## 2016-09-20 MED ORDER — SODIUM CHLORIDE 0.9% FLUSH
10.0000 mL | INTRAVENOUS | Status: DC | PRN
  Administered 2016-09-20: 10 mL via INTRAVENOUS
  Filled 2016-09-20: qty 10

## 2016-09-20 MED ORDER — SODIUM CHLORIDE 0.9 % IV SOLN
250.0000 mL | Freq: Once | INTRAVENOUS | Status: AC
  Administered 2016-09-20: 250 mL via INTRAVENOUS
  Filled 2016-09-20: qty 250

## 2016-09-20 MED ORDER — ACETAMINOPHEN 325 MG PO TABS
650.0000 mg | ORAL_TABLET | Freq: Once | ORAL | Status: AC
  Administered 2016-09-20: 650 mg via ORAL
  Filled 2016-09-20: qty 2

## 2016-09-21 ENCOUNTER — Other Ambulatory Visit: Payer: Self-pay

## 2016-09-21 ENCOUNTER — Ambulatory Visit: Payer: Medicare Other | Admitting: Family Medicine

## 2016-09-21 LAB — TYPE AND SCREEN
ABO/RH(D): AB POS
Antibody Screen: NEGATIVE
UNIT DIVISION: 0
UNIT DIVISION: 0

## 2016-09-21 LAB — BPAM RBC
BLOOD PRODUCT EXPIRATION DATE: 201810042359
Blood Product Expiration Date: 201809302359
ISSUE DATE / TIME: 201809181142
ISSUE DATE / TIME: 201809180944
UNIT TYPE AND RH: 600
Unit Type and Rh: 6200

## 2016-09-21 MED ORDER — LEVOTHYROXINE SODIUM 50 MCG PO TABS: ORAL_TABLET | ORAL | 2 refills | Status: DC

## 2016-09-21 NOTE — Telephone Encounter (Signed)
Last tsh in May was normal; Rx approved

## 2016-09-23 DIAGNOSIS — I129 Hypertensive chronic kidney disease with stage 1 through stage 4 chronic kidney disease, or unspecified chronic kidney disease: Secondary | ICD-10-CM | POA: Diagnosis not present

## 2016-09-23 DIAGNOSIS — I4891 Unspecified atrial fibrillation: Secondary | ICD-10-CM | POA: Diagnosis not present

## 2016-09-23 DIAGNOSIS — C3411 Malignant neoplasm of upper lobe, right bronchus or lung: Secondary | ICD-10-CM | POA: Diagnosis not present

## 2016-09-23 DIAGNOSIS — Z9981 Dependence on supplemental oxygen: Secondary | ICD-10-CM | POA: Diagnosis not present

## 2016-09-23 DIAGNOSIS — R600 Localized edema: Secondary | ICD-10-CM | POA: Diagnosis not present

## 2016-09-23 DIAGNOSIS — N189 Chronic kidney disease, unspecified: Secondary | ICD-10-CM | POA: Diagnosis not present

## 2016-09-26 IMAGING — CR DG CHEST 2V
2 series · 2 of 2 positions shown · non-contrast
Comparison: Chest x-rays 06/02/2014 and earlier. CT chest
07/02/2014, 12/03/2013.

CLINICAL DATA: Current history of right upper lobe lung cancer,
presenting with edema, erythema and blistering of both hands over
the past 2 days.

EXAM:
CHEST  2 VIEW

[chest lat]
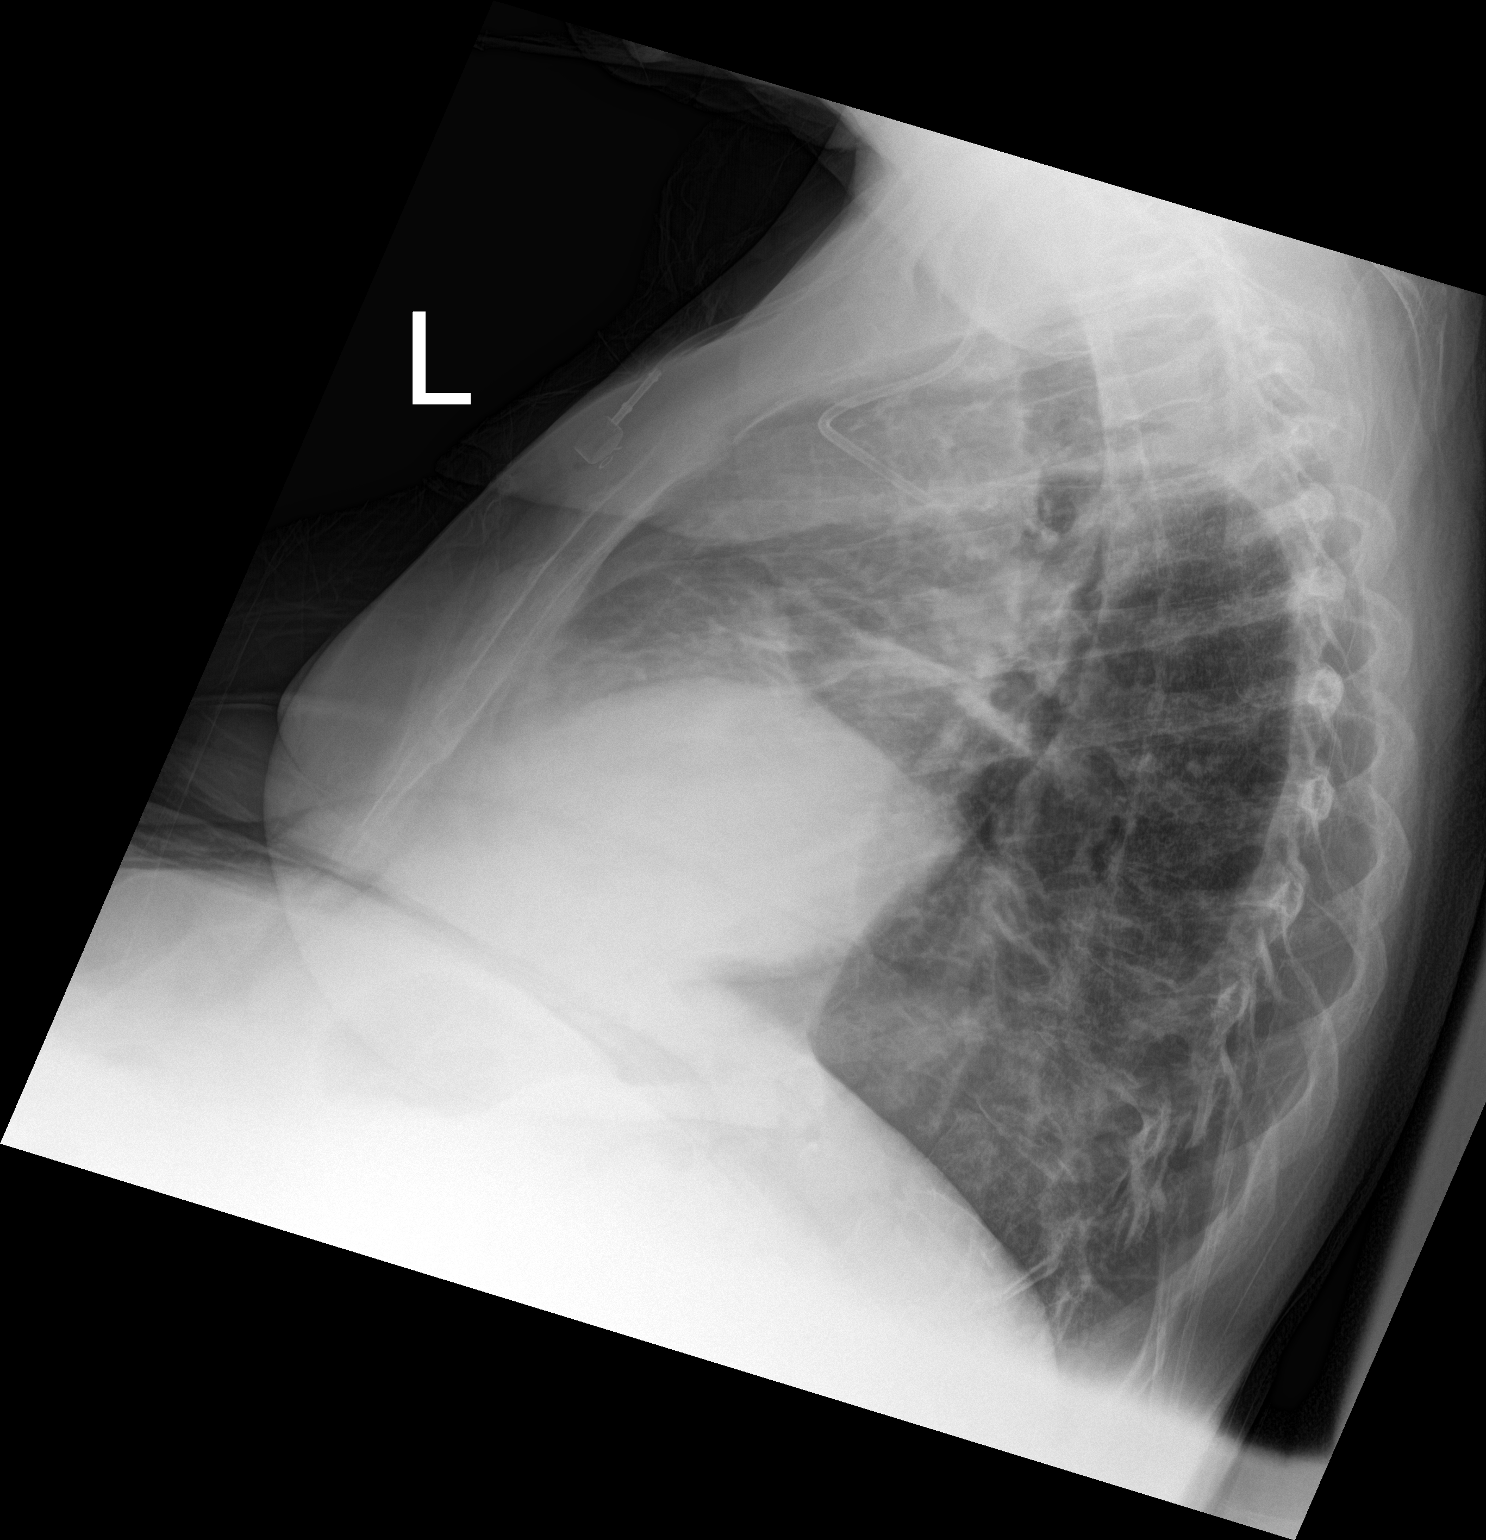

[chest ap]
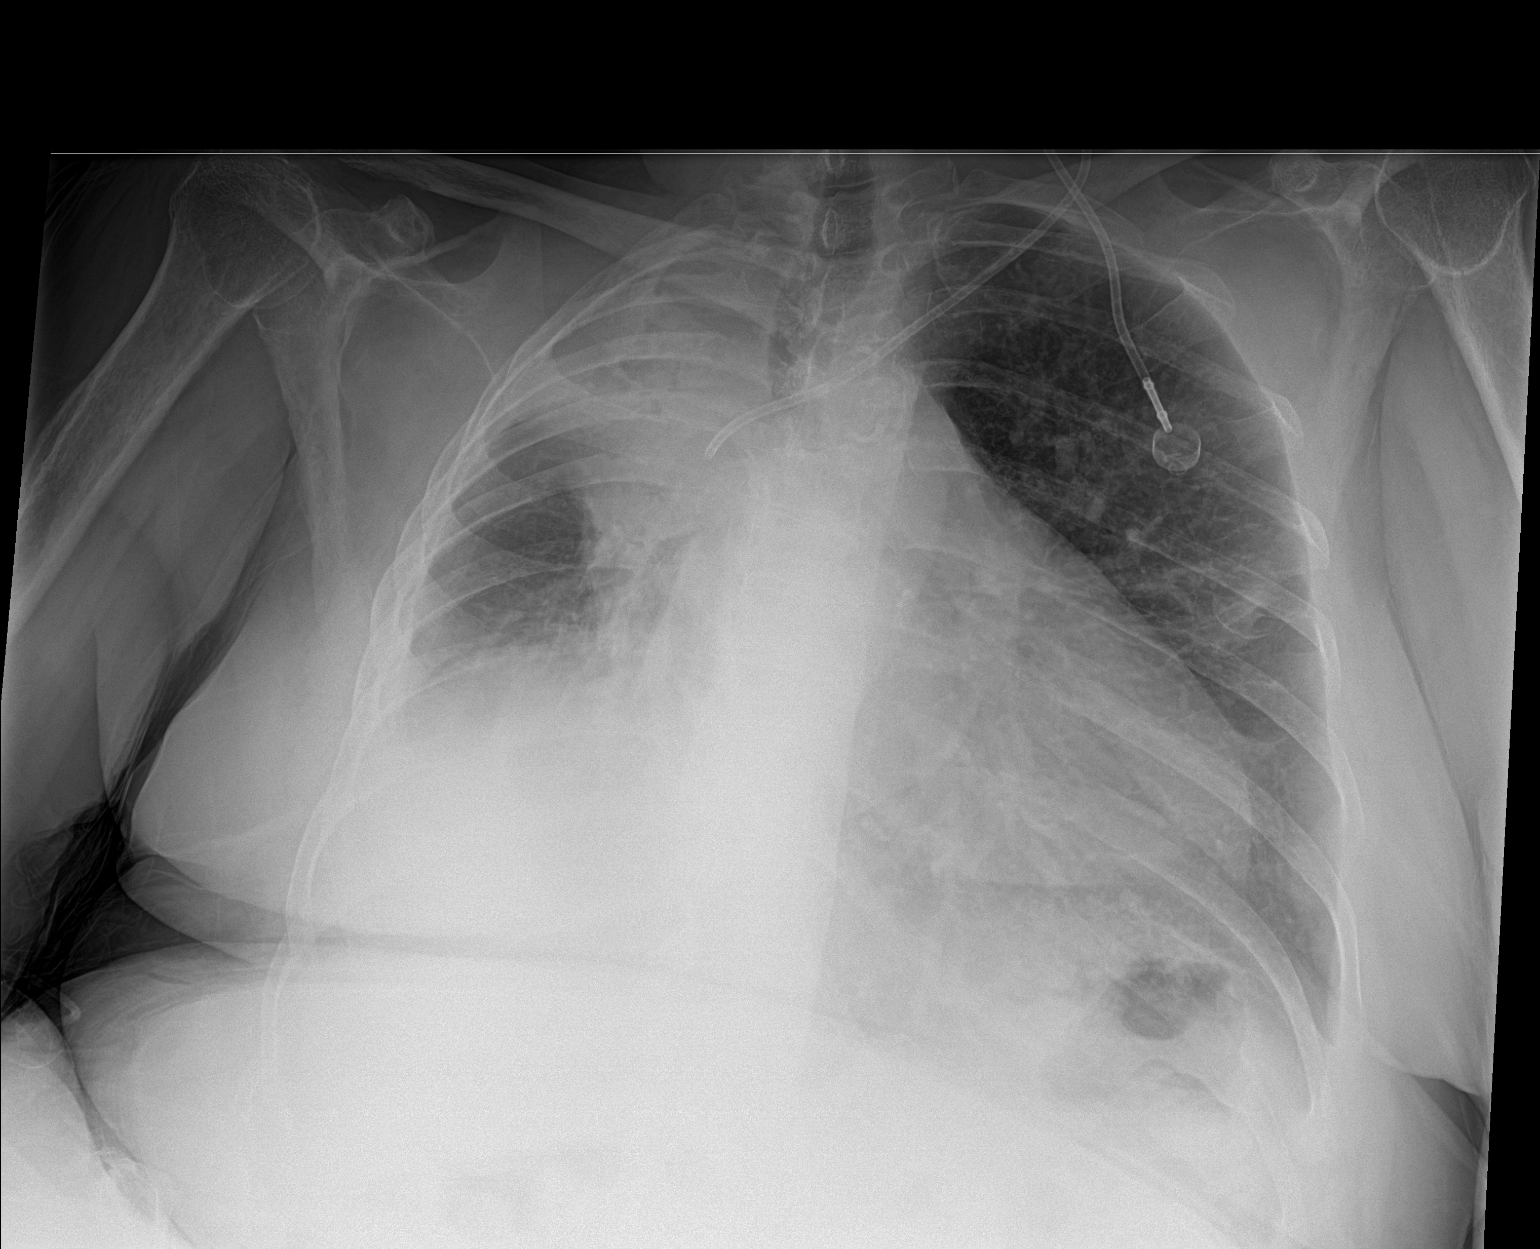

[2 of 2 positions shown; findings below may reference images not displayed]

FINDINGS: AP sitting erect and lateral images were obtained. New dense right
middle lobe and right lower lobe atelectasis. Post radiation
fibrosis medially in the entire right lung as noted previously.
Linear scarring in the inferior left upper lobe, unchanged. Left
lung otherwise clear. No visible pleural effusions.

Cardiac silhouette moderately enlarged, unchanged, allowing for
differences in technique. Thoracic aorta atherosclerotic, unchanged.
Degenerative changes and DISH involving the thoracic spine.
IMPRESSION: 1. Dense right middle lobe and right lower lobe atelectasis, a new
finding since the most recent chest CT 07/02/2014.
2. Stable linear scarring in the left upper lobe. Left lung
otherwise clear.
3. Stable cardiomegaly without evidence of pulmonary edema.

## 2016-09-27 DIAGNOSIS — N189 Chronic kidney disease, unspecified: Secondary | ICD-10-CM | POA: Diagnosis not present

## 2016-09-27 DIAGNOSIS — C3411 Malignant neoplasm of upper lobe, right bronchus or lung: Secondary | ICD-10-CM | POA: Diagnosis not present

## 2016-09-27 DIAGNOSIS — Z9981 Dependence on supplemental oxygen: Secondary | ICD-10-CM | POA: Diagnosis not present

## 2016-09-27 DIAGNOSIS — I4891 Unspecified atrial fibrillation: Secondary | ICD-10-CM | POA: Diagnosis not present

## 2016-09-27 DIAGNOSIS — I129 Hypertensive chronic kidney disease with stage 1 through stage 4 chronic kidney disease, or unspecified chronic kidney disease: Secondary | ICD-10-CM | POA: Diagnosis not present

## 2016-09-27 DIAGNOSIS — R600 Localized edema: Secondary | ICD-10-CM | POA: Diagnosis not present

## 2016-09-29 DIAGNOSIS — I509 Heart failure, unspecified: Secondary | ICD-10-CM | POA: Diagnosis not present

## 2016-09-29 DIAGNOSIS — I10 Essential (primary) hypertension: Secondary | ICD-10-CM | POA: Diagnosis not present

## 2016-09-29 DIAGNOSIS — R0602 Shortness of breath: Secondary | ICD-10-CM | POA: Diagnosis not present

## 2016-09-29 DIAGNOSIS — I4891 Unspecified atrial fibrillation: Secondary | ICD-10-CM | POA: Diagnosis not present

## 2016-09-29 DIAGNOSIS — I428 Other cardiomyopathies: Secondary | ICD-10-CM | POA: Diagnosis not present

## 2016-09-29 DIAGNOSIS — I2729 Other secondary pulmonary hypertension: Secondary | ICD-10-CM | POA: Diagnosis not present

## 2016-09-30 ENCOUNTER — Ambulatory Visit (INDEPENDENT_AMBULATORY_CARE_PROVIDER_SITE_OTHER): Payer: Medicare Other | Admitting: Family Medicine

## 2016-09-30 ENCOUNTER — Encounter: Payer: Self-pay | Admitting: Family Medicine

## 2016-09-30 VITALS — BP 106/74 | HR 85 | Temp 98.5°F | Resp 16

## 2016-09-30 DIAGNOSIS — E7800 Pure hypercholesterolemia, unspecified: Secondary | ICD-10-CM | POA: Diagnosis not present

## 2016-09-30 DIAGNOSIS — R0902 Hypoxemia: Secondary | ICD-10-CM | POA: Diagnosis not present

## 2016-09-30 DIAGNOSIS — C3411 Malignant neoplasm of upper lobe, right bronchus or lung: Secondary | ICD-10-CM

## 2016-09-30 DIAGNOSIS — N183 Chronic kidney disease, stage 3 unspecified: Secondary | ICD-10-CM

## 2016-09-30 DIAGNOSIS — Z23 Encounter for immunization: Secondary | ICD-10-CM | POA: Diagnosis not present

## 2016-09-30 DIAGNOSIS — I480 Paroxysmal atrial fibrillation: Secondary | ICD-10-CM | POA: Diagnosis not present

## 2016-09-30 DIAGNOSIS — R739 Hyperglycemia, unspecified: Secondary | ICD-10-CM

## 2016-09-30 DIAGNOSIS — L732 Hidradenitis suppurativa: Secondary | ICD-10-CM

## 2016-09-30 DIAGNOSIS — J449 Chronic obstructive pulmonary disease, unspecified: Secondary | ICD-10-CM

## 2016-09-30 DIAGNOSIS — E038 Other specified hypothyroidism: Secondary | ICD-10-CM

## 2016-09-30 MED ORDER — DOXYCYCLINE HYCLATE 100 MG PO TABS
100.0000 mg | ORAL_TABLET | Freq: Two times a day (BID) | ORAL | 0 refills | Status: DC
Start: 2016-09-30 — End: 2016-11-21

## 2016-09-30 NOTE — Progress Notes (Signed)
BP 106/74   Pulse 85   Temp 98.5 F (36.9 C) (Oral)   Resp 16   SpO2 96%    Subjective:    Patient ID: Katrina Kramer, female    DOB: [birth year], 76 y.o.   MRN: [id number]  HPI: Katrina Kramer is a 76 y.o. female  Chief Complaint  Patient presents with  . Follow-up    HPI Patient is well-known to me She has lung cancer; followed by oncologist at the cancer center and is currently enrolled in Hospice She has anemia and underwent a blood transfusion on 09/26/16 for a hemoglobin of 6.6; this is managed by heme-onc No reaction to the blood transfusion Breathing is the same, COPD; on 2 l/m Atrial fibrillation; heart rate was high at cardiologist's office yesterday and changed med and came down already She has hypothyroidism Lab Results  Component Value Date   TSH 2.18 09/30/2016   T3TOTAL 102 07/11/2014   CKD; last Cr was 1.32 eleven days ago, improved from 1.71 four months ago Not as much diuretics; not having leg swelling; limiting salt She has boils on her inner thighs; started last week; she got some pads to put on in her underwear; tonya said they burst and were draining; no fevers I reviewed her last glucose; little cup of peaches and two small pancakes; mouth dry in the morning; no one with diabetes in the family; nocturia 2x to 3x a night Husband says she isn't eating as much as she should, but better than before; appetite is pretty good High cholesterol; ham biscuit once a week; no bacon or sausage  Depression screen [facility] 2/9 09/30/2016 05/16/2016 01/12/2016 07/30/2015 04/13/2015  Decreased Interest 0 0 0 0 0  Down, Depressed, Hopeless 0 0 0 0 0  PHQ - 2 Score 0 0 0 0 0    Relevant past medical, surgical, family and social history reviewed Past Medical History:  Diagnosis Date  . Anemia   . Anemia   . Atrial fibrillation ([place])    Dr. Humphrey Rolls, cardiologist  . CKD (chronic kidney disease) stage 4, GFR 15-29 ml/min ([place]) 05/01/2015  . Hypertension   . Hypothyroidism   .  Lung cancer ([place])   . Lung cancer [facility])    Past Surgical History:  Procedure Laterality Date  . COLONOSCOPY N/A 10/11/2014   Procedure: COLONOSCOPY;  Surgeon: Manya Silvas, MD;  Location: [facility] ENDOSCOPY;  Service: Endoscopy;  Laterality: N/A;  . ESOPHAGOGASTRODUODENOSCOPY N/A 10/10/2014   Procedure: ESOPHAGOGASTRODUODENOSCOPY (EGD);  Surgeon: Manya Silvas, MD;  Location[facility] ENDOSCOPY;  Service: Endoscopy;  Laterality: N/A;  . UPPER GASTROINTESTINAL ENDOSCOPY  02/24/14   Family History  Problem Relation Age of Onset  . Stomach cancer Mother   . Cancer Mother        unsure where but states "it spead all over"  . Alzheimer's disease Father   . Varicose Veins Daughter   . Diabetes Neg Hx   . Heart disease Neg Hx   . Hypertension Neg Hx   . Stroke Neg Hx   . COPD Neg Hx    Social History   Social History  . Marital status: Married    Spouse name: N/A  . Number of children: N/A  . Years of education: N/A   Occupational History  . Not on file.   Social History Main Topics  . Smoking status: Former Smoker    Packs/day: 0.75    Years: 30.00    Types: Cigarettes  Quit date: 01/03/2005  . Smokeless tobacco: Never Used  . Alcohol use No  . Drug use: No  . Sexual activity: Not Currently   Other Topics Concern  . Not on file   Social History Narrative  . No narrative on file    Interim medical history since last visit reviewed. Allergies and medications reviewed  Review of Systems Per HPI unless specifically indicated above     Objective:    BP 106/74   Pulse 85   Temp 98.5 F (36.9 C) (Oral)   Resp 16   SpO2 96%   Wt Readings from Last 3 Encounters:  09/19/16 127 lb (57.6 kg)  07/18/16 124 lb (56.2 kg)  05/16/16 145 lb 4.8 oz (65.9 kg)    Physical Exam  Constitutional: No distress.  Elderly female seated in wheelchair, gait not assessed; appears chronically ill but nontoxic; significant weight loss noted  HENT:  Head: Normocephalic and  atraumatic.  Eyes: EOM are normal. No scleral icterus.  Neck: No JVD present. No thyromegaly present.  Cardiovascular: Normal rate.   Pulmonary/Chest: Effort normal. No accessory muscle usage. No respiratory distress. She has decreased breath sounds.  Abdominal: She exhibits no distension.  Musculoskeletal: She exhibits no edema.  Neurological: She is alert.  Skin: Skin is warm. Lesion (boils right upper thigh medially; no active drainage) noted. She is not diaphoretic. No pallor.  Psychiatric: She has a normal mood and affect. Her mood appears not anxious. She does not exhibit a depressed mood.  Good eye contact with examiner      Assessment & Plan:   Problem List Items Addressed This Visit      Cardiovascular and Mediastinum   Atrial fibrillation ([place])    Followed by cardiologist; on agent for rate-control      Relevant Medications   metoprolol tartrate (LOPRESSOR) 50 MG tablet   Other Relevant Orders   COMPLETE METABOLIC PANEL WITH GFR (Completed)     Respiratory   Lung cancer ([place]) - Primary    Managed by oncologist; enrolled in Hospice care      Relevant Medications   doxycycline (VIBRA-TABS) 100 MG tablet   Hypoxia    Continuous oxygen at 2 l/m      Chronic obstructive pulmonary disease ([place])    Secondary to years of smoking; continue inhalers        Endocrine   Hypothyroidism    Check TSH and free T4 because of weight change      Relevant Medications   metoprolol tartrate (LOPRESSOR) 50 MG tablet   Other Relevant Orders   T4, free (Completed)   TSH (Completed)     Genitourinary   CKD (chronic kidney disease) stage 3, GFR 30-59 ml/min (H[facility])    improved      Relevant Orders   COMPLETE METABOLIC PANEL WITH GFR (Completed)     Other   Hypercholesterolemia    Check lipids; not truly fasting; no bacon or sausage at all      Relevant Medications   metoprolol tartrate (LOPRESSOR) 50 MG tablet   Other Relevant Orders   Lipid panel (Completed)      Other Visit Diagnoses    Needs flu shot       Relevant Orders   Flu vaccine HIGH DOSE PF (Fluzone High dose) (Completed)   Hyperglycemia       Relevant Orders   Hemoglobin A1c (Completed)   Hidradenitis       discussed diagnosis; patient does not want to see  surgeon for I&D; will treat with antibiotics, warm packs; to notify me if not imiproving or if getting worse       Follow up plan: Return in about 6 months (around 03/30/2017) for follow-up visit with Dr. Sanda Klein.  An after-visit summary was printed and given to the patient at [place].  Please see the patient instructions which may contain other information and recommendations beyond what is mentioned above in the assessment and plan.  Meds ordered this encounter  Medications  . metoprolol tartrate (LOPRESSOR) 50 MG tablet    Sig: Take 50 mg by mouth 2 (two) times daily.  Marland Kitchen doxycycline (VIBRA-TABS) 100 MG tablet    Sig: Take 1 tablet (100 mg total) by mouth 2 (two) times daily.    Dispense:  20 tablet    Refill:  0    Orders Placed This Encounter  Procedures  . Flu vaccine HIGH DOSE PF (Fluzone High dose)  . T4, free  . TSH  . Hemoglobin A1c  . Lipid panel  . COMPLETE METABOLIC PANEL WITH GFR

## 2016-09-30 NOTE — Assessment & Plan Note (Signed)
Secondary to years of smoking; continue inhalers

## 2016-09-30 NOTE — Assessment & Plan Note (Signed)
Managed by oncologist; enrolled in Hospice care

## 2016-09-30 NOTE — Assessment & Plan Note (Signed)
Continuous oxygen at 2 l/m

## 2016-09-30 NOTE — Assessment & Plan Note (Signed)
Followed by cardiologist; on agent for rate-control

## 2016-09-30 NOTE — Patient Instructions (Addendum)
Start the antibiotics Please do eat yogurt daily or take a probiotic daily for the next month We want to replace the healthy germs in the gut If you notice foul, watery diarrhea in the next two months, schedule an appointment RIGHT AWAY You received the flu shot today; it should protect you against the flu virus over the coming months; it will take about two weeks for antibodies to develop; do try to stay away from hospitals, nursing homes, and daycares during peak flu season; taking extra vitamin C daily during flu season may help you avoid getting sick

## 2016-09-30 NOTE — Assessment & Plan Note (Signed)
Check TSH and free T4 because of weight change

## 2016-09-30 NOTE — Assessment & Plan Note (Signed)
improved

## 2016-09-30 NOTE — Assessment & Plan Note (Signed)
Check lipids; not truly fasting; no bacon or sausage at all

## 2016-10-01 LAB — LIPID PANEL
CHOLESTEROL: 149 mg/dL (ref <200)
HDL: 36 mg/dL — ABNORMAL LOW (ref >50)
LDL CHOLESTEROL (CALC): 93 mg/dL
Non-HDL Cholesterol (Calc): 113 mg/dL (calc) (ref <130)
TRIGLYCERIDES: 100 mg/dL (ref <150)
Total CHOL/HDL Ratio: 4.1 (calc) (ref <5.0)

## 2016-10-01 LAB — T4, FREE: FREE T4: 1.1 ng/dL (ref 0.8–1.8)

## 2016-10-01 LAB — COMPLETE METABOLIC PANEL WITH GFR
AG Ratio: 0.5 (calc) — ABNORMAL LOW (ref 1.0–2.5)
ALBUMIN MSPROF: 2.6 g/dL — AB (ref 3.6–5.1)
ALKALINE PHOSPHATASE (APISO): 83 U/L (ref 33–130)
ALT: 5 U/L — ABNORMAL LOW (ref 6–29)
AST: 13 U/L (ref 10–35)
BILIRUBIN TOTAL: 0.7 mg/dL (ref 0.2–1.2)
BUN / CREAT RATIO: 17 (calc) (ref 6–22)
BUN: 17 mg/dL (ref 7–25)
CHLORIDE: 98 mmol/L (ref 98–110)
CO2: 26 mmol/L (ref 20–32)
CREATININE: 1.01 mg/dL — AB (ref 0.60–0.93)
Calcium: 8.2 mg/dL — ABNORMAL LOW (ref 8.6–10.4)
GFR, EST AFRICAN AMERICAN: 64 mL/min/{1.73_m2} (ref >=60)
GFR, Est Non African American: 55 mL/min/{1.73_m2} — ABNORMAL LOW (ref >=60)
GLOBULIN: 4.9 g/dL — AB (ref 1.9–3.7)
Glucose, Bld: 180 mg/dL — ABNORMAL HIGH (ref 65–99)
Potassium: 4 mmol/L (ref 3.5–5.3)
SODIUM: 133 mmol/L — AB (ref 135–146)
TOTAL PROTEIN: 7.5 g/dL (ref 6.1–8.1)

## 2016-10-01 LAB — HEMOGLOBIN A1C
HEMOGLOBIN A1C: 5.8 %{Hb} — AB (ref <5.7)
MEAN PLASMA GLUCOSE: 120 (calc)
eAG (mmol/L): 6.6 (calc)

## 2016-10-01 LAB — TSH: TSH: 2.18 m[IU]/L (ref 0.40–4.50)

## 2016-10-03 ENCOUNTER — Other Ambulatory Visit: Payer: Self-pay | Admitting: Internal Medicine

## 2016-10-03 DIAGNOSIS — Z87891 Personal history of nicotine dependence: Secondary | ICD-10-CM | POA: Diagnosis not present

## 2016-10-03 DIAGNOSIS — N189 Chronic kidney disease, unspecified: Secondary | ICD-10-CM | POA: Diagnosis not present

## 2016-10-03 DIAGNOSIS — R630 Anorexia: Secondary | ICD-10-CM | POA: Diagnosis not present

## 2016-10-03 DIAGNOSIS — Z452 Encounter for adjustment and management of vascular access device: Secondary | ICD-10-CM | POA: Diagnosis not present

## 2016-10-03 DIAGNOSIS — E039 Hypothyroidism, unspecified: Secondary | ICD-10-CM | POA: Diagnosis not present

## 2016-10-03 DIAGNOSIS — E871 Hypo-osmolality and hyponatremia: Secondary | ICD-10-CM | POA: Diagnosis not present

## 2016-10-03 DIAGNOSIS — Z9981 Dependence on supplemental oxygen: Secondary | ICD-10-CM | POA: Diagnosis not present

## 2016-10-03 DIAGNOSIS — C3411 Malignant neoplasm of upper lobe, right bronchus or lung: Secondary | ICD-10-CM | POA: Diagnosis not present

## 2016-10-03 DIAGNOSIS — I4891 Unspecified atrial fibrillation: Secondary | ICD-10-CM | POA: Diagnosis not present

## 2016-10-03 DIAGNOSIS — D631 Anemia in chronic kidney disease: Secondary | ICD-10-CM | POA: Diagnosis not present

## 2016-10-03 DIAGNOSIS — N183 Chronic kidney disease, stage 3 (moderate): Secondary | ICD-10-CM | POA: Diagnosis not present

## 2016-10-03 DIAGNOSIS — R600 Localized edema: Secondary | ICD-10-CM | POA: Diagnosis not present

## 2016-10-03 DIAGNOSIS — D649 Anemia, unspecified: Secondary | ICD-10-CM | POA: Diagnosis not present

## 2016-10-03 DIAGNOSIS — R634 Abnormal weight loss: Secondary | ICD-10-CM | POA: Diagnosis not present

## 2016-10-03 DIAGNOSIS — I129 Hypertensive chronic kidney disease with stage 1 through stage 4 chronic kidney disease, or unspecified chronic kidney disease: Secondary | ICD-10-CM | POA: Diagnosis not present

## 2016-10-04 DIAGNOSIS — I4891 Unspecified atrial fibrillation: Secondary | ICD-10-CM | POA: Diagnosis not present

## 2016-10-04 DIAGNOSIS — Z87891 Personal history of nicotine dependence: Secondary | ICD-10-CM | POA: Diagnosis not present

## 2016-10-04 DIAGNOSIS — D649 Anemia, unspecified: Secondary | ICD-10-CM | POA: Diagnosis not present

## 2016-10-04 DIAGNOSIS — C3411 Malignant neoplasm of upper lobe, right bronchus or lung: Secondary | ICD-10-CM | POA: Diagnosis not present

## 2016-10-04 DIAGNOSIS — I129 Hypertensive chronic kidney disease with stage 1 through stage 4 chronic kidney disease, or unspecified chronic kidney disease: Secondary | ICD-10-CM | POA: Diagnosis not present

## 2016-10-04 DIAGNOSIS — E039 Hypothyroidism, unspecified: Secondary | ICD-10-CM | POA: Diagnosis not present

## 2016-10-05 ENCOUNTER — Other Ambulatory Visit: Payer: Self-pay | Admitting: Family Medicine

## 2016-10-05 ENCOUNTER — Encounter: Payer: Self-pay | Admitting: Family Medicine

## 2016-10-05 DIAGNOSIS — E119 Type 2 diabetes mellitus without complications: Secondary | ICD-10-CM | POA: Insufficient documentation

## 2016-10-05 HISTORY — DX: Type 2 diabetes mellitus without complications: E11.9

## 2016-10-05 MED ORDER — LINAGLIPTIN 5 MG PO TABS: 5.0000 mg | ORAL_TABLET | Freq: Every day | ORAL | 1 refills | Status: DC

## 2016-10-05 NOTE — Assessment & Plan Note (Signed)
In Hospice care; A1c is only 5.8, but boils and higher than ideal sugars; will start [place]; will not be aggressive with diabetic measures given her lung cancer

## 2016-10-05 NOTE — Progress Notes (Signed)
Add tradjenta

## 2016-10-11 DIAGNOSIS — E039 Hypothyroidism, unspecified: Secondary | ICD-10-CM | POA: Diagnosis not present

## 2016-10-11 DIAGNOSIS — C3411 Malignant neoplasm of upper lobe, right bronchus or lung: Secondary | ICD-10-CM | POA: Diagnosis not present

## 2016-10-11 DIAGNOSIS — Z87891 Personal history of nicotine dependence: Secondary | ICD-10-CM | POA: Diagnosis not present

## 2016-10-11 DIAGNOSIS — I4891 Unspecified atrial fibrillation: Secondary | ICD-10-CM | POA: Diagnosis not present

## 2016-10-11 DIAGNOSIS — D649 Anemia, unspecified: Secondary | ICD-10-CM | POA: Diagnosis not present

## 2016-10-11 DIAGNOSIS — I129 Hypertensive chronic kidney disease with stage 1 through stage 4 chronic kidney disease, or unspecified chronic kidney disease: Secondary | ICD-10-CM | POA: Diagnosis not present

## 2016-10-13 DIAGNOSIS — I129 Hypertensive chronic kidney disease with stage 1 through stage 4 chronic kidney disease, or unspecified chronic kidney disease: Secondary | ICD-10-CM | POA: Diagnosis not present

## 2016-10-13 DIAGNOSIS — C3411 Malignant neoplasm of upper lobe, right bronchus or lung: Secondary | ICD-10-CM | POA: Diagnosis not present

## 2016-10-13 DIAGNOSIS — Z87891 Personal history of nicotine dependence: Secondary | ICD-10-CM | POA: Diagnosis not present

## 2016-10-13 DIAGNOSIS — D649 Anemia, unspecified: Secondary | ICD-10-CM | POA: Diagnosis not present

## 2016-10-13 DIAGNOSIS — I4891 Unspecified atrial fibrillation: Secondary | ICD-10-CM | POA: Diagnosis not present

## 2016-10-13 DIAGNOSIS — E039 Hypothyroidism, unspecified: Secondary | ICD-10-CM | POA: Diagnosis not present

## 2016-10-16 DIAGNOSIS — I4891 Unspecified atrial fibrillation: Secondary | ICD-10-CM | POA: Diagnosis not present

## 2016-10-16 DIAGNOSIS — D649 Anemia, unspecified: Secondary | ICD-10-CM | POA: Diagnosis not present

## 2016-10-16 DIAGNOSIS — Z87891 Personal history of nicotine dependence: Secondary | ICD-10-CM | POA: Diagnosis not present

## 2016-10-16 DIAGNOSIS — E039 Hypothyroidism, unspecified: Secondary | ICD-10-CM | POA: Diagnosis not present

## 2016-10-16 DIAGNOSIS — C3411 Malignant neoplasm of upper lobe, right bronchus or lung: Secondary | ICD-10-CM | POA: Diagnosis not present

## 2016-10-16 DIAGNOSIS — I129 Hypertensive chronic kidney disease with stage 1 through stage 4 chronic kidney disease, or unspecified chronic kidney disease: Secondary | ICD-10-CM | POA: Diagnosis not present

## 2016-10-17 DIAGNOSIS — D649 Anemia, unspecified: Secondary | ICD-10-CM | POA: Diagnosis not present

## 2016-10-17 DIAGNOSIS — E039 Hypothyroidism, unspecified: Secondary | ICD-10-CM | POA: Diagnosis not present

## 2016-10-17 DIAGNOSIS — Z87891 Personal history of nicotine dependence: Secondary | ICD-10-CM | POA: Diagnosis not present

## 2016-10-17 DIAGNOSIS — I129 Hypertensive chronic kidney disease with stage 1 through stage 4 chronic kidney disease, or unspecified chronic kidney disease: Secondary | ICD-10-CM | POA: Diagnosis not present

## 2016-10-17 DIAGNOSIS — C3411 Malignant neoplasm of upper lobe, right bronchus or lung: Secondary | ICD-10-CM | POA: Diagnosis not present

## 2016-10-17 DIAGNOSIS — I4891 Unspecified atrial fibrillation: Secondary | ICD-10-CM | POA: Diagnosis not present

## 2016-10-19 ENCOUNTER — Other Ambulatory Visit: Payer: Self-pay | Admitting: Internal Medicine

## 2016-10-20 DIAGNOSIS — Z87891 Personal history of nicotine dependence: Secondary | ICD-10-CM | POA: Diagnosis not present

## 2016-10-20 DIAGNOSIS — I129 Hypertensive chronic kidney disease with stage 1 through stage 4 chronic kidney disease, or unspecified chronic kidney disease: Secondary | ICD-10-CM | POA: Diagnosis not present

## 2016-10-20 DIAGNOSIS — C3411 Malignant neoplasm of upper lobe, right bronchus or lung: Secondary | ICD-10-CM | POA: Diagnosis not present

## 2016-10-20 DIAGNOSIS — D649 Anemia, unspecified: Secondary | ICD-10-CM | POA: Diagnosis not present

## 2016-10-20 DIAGNOSIS — I4891 Unspecified atrial fibrillation: Secondary | ICD-10-CM | POA: Diagnosis not present

## 2016-10-20 DIAGNOSIS — E039 Hypothyroidism, unspecified: Secondary | ICD-10-CM | POA: Diagnosis not present

## 2016-10-21 DIAGNOSIS — I10 Essential (primary) hypertension: Secondary | ICD-10-CM | POA: Diagnosis not present

## 2016-10-21 DIAGNOSIS — I4891 Unspecified atrial fibrillation: Secondary | ICD-10-CM | POA: Diagnosis not present

## 2016-10-21 DIAGNOSIS — I509 Heart failure, unspecified: Secondary | ICD-10-CM | POA: Diagnosis not present

## 2016-10-21 DIAGNOSIS — I428 Other cardiomyopathies: Secondary | ICD-10-CM | POA: Diagnosis not present

## 2016-10-21 DIAGNOSIS — R0602 Shortness of breath: Secondary | ICD-10-CM | POA: Diagnosis not present

## 2016-10-26 DIAGNOSIS — D649 Anemia, unspecified: Secondary | ICD-10-CM | POA: Diagnosis not present

## 2016-10-26 DIAGNOSIS — I129 Hypertensive chronic kidney disease with stage 1 through stage 4 chronic kidney disease, or unspecified chronic kidney disease: Secondary | ICD-10-CM | POA: Diagnosis not present

## 2016-10-26 DIAGNOSIS — Z87891 Personal history of nicotine dependence: Secondary | ICD-10-CM | POA: Diagnosis not present

## 2016-10-26 DIAGNOSIS — I4891 Unspecified atrial fibrillation: Secondary | ICD-10-CM | POA: Diagnosis not present

## 2016-10-26 DIAGNOSIS — E039 Hypothyroidism, unspecified: Secondary | ICD-10-CM | POA: Diagnosis not present

## 2016-10-26 DIAGNOSIS — C3411 Malignant neoplasm of upper lobe, right bronchus or lung: Secondary | ICD-10-CM | POA: Diagnosis not present

## 2016-10-27 DIAGNOSIS — I428 Other cardiomyopathies: Secondary | ICD-10-CM | POA: Diagnosis not present

## 2016-10-27 DIAGNOSIS — I129 Hypertensive chronic kidney disease with stage 1 through stage 4 chronic kidney disease, or unspecified chronic kidney disease: Secondary | ICD-10-CM | POA: Diagnosis not present

## 2016-10-27 DIAGNOSIS — R0602 Shortness of breath: Secondary | ICD-10-CM | POA: Diagnosis not present

## 2016-10-27 DIAGNOSIS — I10 Essential (primary) hypertension: Secondary | ICD-10-CM | POA: Diagnosis not present

## 2016-10-27 DIAGNOSIS — E039 Hypothyroidism, unspecified: Secondary | ICD-10-CM | POA: Diagnosis not present

## 2016-10-27 DIAGNOSIS — C3411 Malignant neoplasm of upper lobe, right bronchus or lung: Secondary | ICD-10-CM | POA: Diagnosis not present

## 2016-10-27 DIAGNOSIS — Z87891 Personal history of nicotine dependence: Secondary | ICD-10-CM | POA: Diagnosis not present

## 2016-10-27 DIAGNOSIS — D649 Anemia, unspecified: Secondary | ICD-10-CM | POA: Diagnosis not present

## 2016-10-27 DIAGNOSIS — I509 Heart failure, unspecified: Secondary | ICD-10-CM | POA: Diagnosis not present

## 2016-10-27 DIAGNOSIS — I4891 Unspecified atrial fibrillation: Secondary | ICD-10-CM | POA: Diagnosis not present

## 2016-10-28 DIAGNOSIS — Z87891 Personal history of nicotine dependence: Secondary | ICD-10-CM | POA: Diagnosis not present

## 2016-10-28 DIAGNOSIS — I4891 Unspecified atrial fibrillation: Secondary | ICD-10-CM | POA: Diagnosis not present

## 2016-10-28 DIAGNOSIS — I129 Hypertensive chronic kidney disease with stage 1 through stage 4 chronic kidney disease, or unspecified chronic kidney disease: Secondary | ICD-10-CM | POA: Diagnosis not present

## 2016-10-28 DIAGNOSIS — E039 Hypothyroidism, unspecified: Secondary | ICD-10-CM | POA: Diagnosis not present

## 2016-10-28 DIAGNOSIS — C3411 Malignant neoplasm of upper lobe, right bronchus or lung: Secondary | ICD-10-CM | POA: Diagnosis not present

## 2016-10-28 DIAGNOSIS — D649 Anemia, unspecified: Secondary | ICD-10-CM | POA: Diagnosis not present

## 2016-11-02 DIAGNOSIS — E039 Hypothyroidism, unspecified: Secondary | ICD-10-CM | POA: Diagnosis not present

## 2016-11-02 DIAGNOSIS — D649 Anemia, unspecified: Secondary | ICD-10-CM | POA: Diagnosis not present

## 2016-11-02 DIAGNOSIS — Z87891 Personal history of nicotine dependence: Secondary | ICD-10-CM | POA: Diagnosis not present

## 2016-11-02 DIAGNOSIS — C3411 Malignant neoplasm of upper lobe, right bronchus or lung: Secondary | ICD-10-CM | POA: Diagnosis not present

## 2016-11-02 DIAGNOSIS — I4891 Unspecified atrial fibrillation: Secondary | ICD-10-CM | POA: Diagnosis not present

## 2016-11-02 DIAGNOSIS — I129 Hypertensive chronic kidney disease with stage 1 through stage 4 chronic kidney disease, or unspecified chronic kidney disease: Secondary | ICD-10-CM | POA: Diagnosis not present

## 2016-11-03 DIAGNOSIS — Z87891 Personal history of nicotine dependence: Secondary | ICD-10-CM | POA: Diagnosis not present

## 2016-11-03 DIAGNOSIS — Z452 Encounter for adjustment and management of vascular access device: Secondary | ICD-10-CM | POA: Diagnosis not present

## 2016-11-03 DIAGNOSIS — R634 Abnormal weight loss: Secondary | ICD-10-CM | POA: Diagnosis not present

## 2016-11-03 DIAGNOSIS — D649 Anemia, unspecified: Secondary | ICD-10-CM | POA: Diagnosis not present

## 2016-11-03 DIAGNOSIS — R630 Anorexia: Secondary | ICD-10-CM | POA: Diagnosis not present

## 2016-11-03 DIAGNOSIS — E039 Hypothyroidism, unspecified: Secondary | ICD-10-CM | POA: Diagnosis not present

## 2016-11-03 DIAGNOSIS — I4891 Unspecified atrial fibrillation: Secondary | ICD-10-CM | POA: Diagnosis not present

## 2016-11-03 DIAGNOSIS — N189 Chronic kidney disease, unspecified: Secondary | ICD-10-CM | POA: Diagnosis not present

## 2016-11-03 DIAGNOSIS — C3411 Malignant neoplasm of upper lobe, right bronchus or lung: Secondary | ICD-10-CM | POA: Diagnosis not present

## 2016-11-03 DIAGNOSIS — R600 Localized edema: Secondary | ICD-10-CM | POA: Diagnosis not present

## 2016-11-03 DIAGNOSIS — Z9981 Dependence on supplemental oxygen: Secondary | ICD-10-CM | POA: Diagnosis not present

## 2016-11-03 DIAGNOSIS — I129 Hypertensive chronic kidney disease with stage 1 through stage 4 chronic kidney disease, or unspecified chronic kidney disease: Secondary | ICD-10-CM | POA: Diagnosis not present

## 2016-11-04 ENCOUNTER — Other Ambulatory Visit: Payer: Self-pay | Admitting: Internal Medicine

## 2016-11-04 ENCOUNTER — Other Ambulatory Visit: Payer: Self-pay | Admitting: Oncology

## 2016-11-07 ENCOUNTER — Other Ambulatory Visit: Payer: Self-pay | Admitting: Internal Medicine

## 2016-11-07 DIAGNOSIS — C3411 Malignant neoplasm of upper lobe, right bronchus or lung: Secondary | ICD-10-CM | POA: Diagnosis not present

## 2016-11-07 DIAGNOSIS — R600 Localized edema: Secondary | ICD-10-CM | POA: Diagnosis not present

## 2016-11-07 DIAGNOSIS — I129 Hypertensive chronic kidney disease with stage 1 through stage 4 chronic kidney disease, or unspecified chronic kidney disease: Secondary | ICD-10-CM | POA: Diagnosis not present

## 2016-11-07 DIAGNOSIS — I4891 Unspecified atrial fibrillation: Secondary | ICD-10-CM | POA: Diagnosis not present

## 2016-11-07 DIAGNOSIS — N189 Chronic kidney disease, unspecified: Secondary | ICD-10-CM | POA: Diagnosis not present

## 2016-11-07 DIAGNOSIS — Z9981 Dependence on supplemental oxygen: Secondary | ICD-10-CM | POA: Diagnosis not present

## 2016-11-08 ENCOUNTER — Other Ambulatory Visit: Payer: Self-pay | Admitting: Internal Medicine

## 2016-11-08 ENCOUNTER — Telehealth: Payer: Self-pay | Admitting: *Deleted

## 2016-11-08 DIAGNOSIS — I129 Hypertensive chronic kidney disease with stage 1 through stage 4 chronic kidney disease, or unspecified chronic kidney disease: Secondary | ICD-10-CM | POA: Diagnosis not present

## 2016-11-08 DIAGNOSIS — I4891 Unspecified atrial fibrillation: Secondary | ICD-10-CM | POA: Diagnosis not present

## 2016-11-08 DIAGNOSIS — N189 Chronic kidney disease, unspecified: Secondary | ICD-10-CM | POA: Diagnosis not present

## 2016-11-08 DIAGNOSIS — C3411 Malignant neoplasm of upper lobe, right bronchus or lung: Secondary | ICD-10-CM | POA: Diagnosis not present

## 2016-11-08 DIAGNOSIS — Z9981 Dependence on supplemental oxygen: Secondary | ICD-10-CM | POA: Diagnosis not present

## 2016-11-08 DIAGNOSIS — R600 Localized edema: Secondary | ICD-10-CM | POA: Diagnosis not present

## 2016-11-08 MED ORDER — BENZONATATE 100 MG PO CAPS: ORAL_CAPSULE | ORAL | 0 refills | Status: DC

## 2016-11-08 NOTE — Telephone Encounter (Signed)
Per Dr. Jacinto Reap, patient can take Tessalon [place] 200mg  three times daily. New rx has been sent in and Mitzi has been notified.

## 2016-11-08 NOTE — Telephone Encounter (Signed)
Asking if Tessalon [place] can be increased to 200 mg three times a day. Nothing is helping her cough. She has tried Delsym every 6 hours, Virtussin AC three times a day, Best boy. If increase the Tessalon, will stop the Delsym. She states she has used nebulized morphine in the past with success on other patients. Please advise

## 2016-11-11 DIAGNOSIS — I129 Hypertensive chronic kidney disease with stage 1 through stage 4 chronic kidney disease, or unspecified chronic kidney disease: Secondary | ICD-10-CM | POA: Diagnosis not present

## 2016-11-11 DIAGNOSIS — I4891 Unspecified atrial fibrillation: Secondary | ICD-10-CM | POA: Diagnosis not present

## 2016-11-11 DIAGNOSIS — R600 Localized edema: Secondary | ICD-10-CM | POA: Diagnosis not present

## 2016-11-11 DIAGNOSIS — C3411 Malignant neoplasm of upper lobe, right bronchus or lung: Secondary | ICD-10-CM | POA: Diagnosis not present

## 2016-11-11 DIAGNOSIS — Z9981 Dependence on supplemental oxygen: Secondary | ICD-10-CM | POA: Diagnosis not present

## 2016-11-11 DIAGNOSIS — N189 Chronic kidney disease, unspecified: Secondary | ICD-10-CM | POA: Diagnosis not present

## 2016-11-16 DIAGNOSIS — R600 Localized edema: Secondary | ICD-10-CM | POA: Diagnosis not present

## 2016-11-16 DIAGNOSIS — N189 Chronic kidney disease, unspecified: Secondary | ICD-10-CM | POA: Diagnosis not present

## 2016-11-16 DIAGNOSIS — I129 Hypertensive chronic kidney disease with stage 1 through stage 4 chronic kidney disease, or unspecified chronic kidney disease: Secondary | ICD-10-CM | POA: Diagnosis not present

## 2016-11-16 DIAGNOSIS — I4891 Unspecified atrial fibrillation: Secondary | ICD-10-CM | POA: Diagnosis not present

## 2016-11-16 DIAGNOSIS — Z9981 Dependence on supplemental oxygen: Secondary | ICD-10-CM | POA: Diagnosis not present

## 2016-11-16 DIAGNOSIS — C3411 Malignant neoplasm of upper lobe, right bronchus or lung: Secondary | ICD-10-CM | POA: Diagnosis not present

## 2016-11-17 DIAGNOSIS — N189 Chronic kidney disease, unspecified: Secondary | ICD-10-CM | POA: Diagnosis not present

## 2016-11-17 DIAGNOSIS — Z9981 Dependence on supplemental oxygen: Secondary | ICD-10-CM | POA: Diagnosis not present

## 2016-11-17 DIAGNOSIS — R600 Localized edema: Secondary | ICD-10-CM | POA: Diagnosis not present

## 2016-11-17 DIAGNOSIS — I4891 Unspecified atrial fibrillation: Secondary | ICD-10-CM | POA: Diagnosis not present

## 2016-11-17 DIAGNOSIS — I428 Other cardiomyopathies: Secondary | ICD-10-CM | POA: Diagnosis not present

## 2016-11-17 DIAGNOSIS — C3411 Malignant neoplasm of upper lobe, right bronchus or lung: Secondary | ICD-10-CM | POA: Diagnosis not present

## 2016-11-17 DIAGNOSIS — I129 Hypertensive chronic kidney disease with stage 1 through stage 4 chronic kidney disease, or unspecified chronic kidney disease: Secondary | ICD-10-CM | POA: Diagnosis not present

## 2016-11-17 DIAGNOSIS — Z8679 Personal history of other diseases of the circulatory system: Secondary | ICD-10-CM | POA: Diagnosis not present

## 2016-11-19 DIAGNOSIS — N189 Chronic kidney disease, unspecified: Secondary | ICD-10-CM | POA: Diagnosis not present

## 2016-11-19 DIAGNOSIS — Z9981 Dependence on supplemental oxygen: Secondary | ICD-10-CM | POA: Diagnosis not present

## 2016-11-19 DIAGNOSIS — R600 Localized edema: Secondary | ICD-10-CM | POA: Diagnosis not present

## 2016-11-19 DIAGNOSIS — C3411 Malignant neoplasm of upper lobe, right bronchus or lung: Secondary | ICD-10-CM | POA: Diagnosis not present

## 2016-11-19 DIAGNOSIS — I4891 Unspecified atrial fibrillation: Secondary | ICD-10-CM | POA: Diagnosis not present

## 2016-11-19 DIAGNOSIS — I129 Hypertensive chronic kidney disease with stage 1 through stage 4 chronic kidney disease, or unspecified chronic kidney disease: Secondary | ICD-10-CM | POA: Diagnosis not present

## 2016-11-21 ENCOUNTER — Inpatient Hospital Stay

## 2016-11-21 ENCOUNTER — Other Ambulatory Visit: Payer: Self-pay | Admitting: *Deleted

## 2016-11-21 ENCOUNTER — Inpatient Hospital Stay: Attending: Internal Medicine | Admitting: Internal Medicine

## 2016-11-21 VITALS — BP 106/58 | HR 69 | Resp 16 | Wt 114.0 lb

## 2016-11-21 DIAGNOSIS — C3411 Malignant neoplasm of upper lobe, right bronchus or lung: Secondary | ICD-10-CM

## 2016-11-21 DIAGNOSIS — Z87891 Personal history of nicotine dependence: Secondary | ICD-10-CM | POA: Diagnosis not present

## 2016-11-21 DIAGNOSIS — Z79899 Other long term (current) drug therapy: Secondary | ICD-10-CM | POA: Diagnosis not present

## 2016-11-21 DIAGNOSIS — R630 Anorexia: Secondary | ICD-10-CM | POA: Insufficient documentation

## 2016-11-21 DIAGNOSIS — E039 Hypothyroidism, unspecified: Secondary | ICD-10-CM | POA: Insufficient documentation

## 2016-11-21 DIAGNOSIS — I4891 Unspecified atrial fibrillation: Secondary | ICD-10-CM | POA: Diagnosis not present

## 2016-11-21 DIAGNOSIS — D649 Anemia, unspecified: Secondary | ICD-10-CM | POA: Diagnosis not present

## 2016-11-21 DIAGNOSIS — Z7982 Long term (current) use of aspirin: Secondary | ICD-10-CM | POA: Insufficient documentation

## 2016-11-21 DIAGNOSIS — N184 Chronic kidney disease, stage 4 (severe): Secondary | ICD-10-CM | POA: Insufficient documentation

## 2016-11-21 DIAGNOSIS — R634 Abnormal weight loss: Secondary | ICD-10-CM | POA: Diagnosis not present

## 2016-11-21 DIAGNOSIS — E1122 Type 2 diabetes mellitus with diabetic chronic kidney disease: Secondary | ICD-10-CM | POA: Insufficient documentation

## 2016-11-21 DIAGNOSIS — I129 Hypertensive chronic kidney disease with stage 1 through stage 4 chronic kidney disease, or unspecified chronic kidney disease: Secondary | ICD-10-CM | POA: Diagnosis not present

## 2016-11-21 LAB — CBC WITH DIFFERENTIAL/PLATELET
BASOS PCT: 1 %
Basophils Absolute: 0 10*3/uL (ref 0–0.1)
EOS ABS: 0 10*3/uL (ref 0–0.7)
Eosinophils Relative: 0 %
HEMATOCRIT: 24.3 % — AB (ref 35.0–47.0)
HEMOGLOBIN: 8 g/dL — AB (ref 12.0–16.0)
LYMPHS ABS: 0.4 10*3/uL — AB (ref 1.0–3.6)
Lymphocytes Relative: 6 %
MCH: 27.8 pg (ref 26.0–34.0)
MCHC: 33 g/dL (ref 32.0–36.0)
MCV: 84 fL (ref 80.0–100.0)
Monocytes Absolute: 0.5 10*3/uL (ref 0.2–0.9)
Monocytes Relative: 8 %
NEUTROS ABS: 5.3 10*3/uL (ref 1.4–6.5)
NEUTROS PCT: 85 %
PLATELETS: 364 10*3/uL (ref 150–440)
RBC: 2.89 MIL/uL — ABNORMAL LOW (ref 3.80–5.20)
RDW: 21.1 % — ABNORMAL HIGH (ref 11.5–14.5)
WBC: 6.2 10*3/uL (ref 3.6–11.0)

## 2016-11-21 LAB — COMPREHENSIVE METABOLIC PANEL
ALBUMIN: 2.3 g/dL — AB (ref 3.5–5.0)
ALT: 8 U/L — ABNORMAL LOW (ref 14–54)
ANION GAP: 9 (ref 5–15)
AST: 19 U/L (ref 15–41)
Alkaline Phosphatase: 70 U/L (ref 38–126)
BILIRUBIN TOTAL: 0.6 mg/dL (ref 0.3–1.2)
BUN: 35 mg/dL — ABNORMAL HIGH (ref 6–20)
CO2: 27 mmol/L (ref 22–32)
Calcium: 8.4 mg/dL — ABNORMAL LOW (ref 8.9–10.3)
Chloride: 94 mmol/L — ABNORMAL LOW (ref 101–111)
Creatinine, Ser: 1.71 mg/dL — ABNORMAL HIGH (ref 0.44–1.00)
GFR calc Af Amer: 33 mL/min — ABNORMAL LOW (ref >60)
GFR calc non Af Amer: 28 mL/min — ABNORMAL LOW (ref >60)
GLUCOSE: 234 mg/dL — AB (ref 65–99)
POTASSIUM: 3.5 mmol/L (ref 3.5–5.1)
SODIUM: 130 mmol/L — AB (ref 135–145)
TOTAL PROTEIN: 8.3 g/dL — AB (ref 6.5–8.1)

## 2016-11-21 LAB — PREPARE RBC (CROSSMATCH)

## 2016-11-21 LAB — SAMPLE TO BLOOD BANK

## 2016-11-21 MED ORDER — ACETAMINOPHEN 325 MG PO TABS
650.0000 mg | ORAL_TABLET | Freq: Once | ORAL | Status: AC
  Administered 2016-11-21: 650 mg via ORAL
  Filled 2016-11-21: qty 2

## 2016-11-21 MED ORDER — SODIUM CHLORIDE 0.9 % IV SOLN
INTRAVENOUS | Status: DC
  Administered 2016-11-21: 13:00:00 via INTRAVENOUS
  Filled 2016-11-21: qty 1000

## 2016-11-21 MED ORDER — DIPHENHYDRAMINE HCL 25 MG PO CAPS
25.0000 mg | ORAL_CAPSULE | Freq: Once | ORAL | Status: AC
  Administered 2016-11-21: 25 mg via ORAL
  Filled 2016-11-21: qty 1

## 2016-11-21 NOTE — Assessment & Plan Note (Addendum)
#   Lung Cancer- non-small cell lung stage IV; s/p Opdivo q2 weeks x#4. PET scan 03/2015 showed improvement of right lower lobe lesion (2.2 x 2.5 compared to 3.5 x 3cm) with extension radiation changes in right lung. Patient's treatments were held due to declining performance status.  Patient is clinically stable with no obvious progression clinically. Hospice has followed for approximately 2 years. Continue with Hospice.   # Anemia - Hemoglobin today is 8.0; clinically symptomatic;Proceed with PRBC.   # Weight loss & decreased appetite- continue with boost/ensure/megace  #CKD- creatinine today is 1.7/ at baseline.    # port flush- performed by Hospice RN. Proceed with as scheduled.   # RTC in 2 months for labs/MD, and consideration for blood transfusion.   # 25 minutes face-to-face with the patient discussing the above plan of care; more than 50% of time spent on prognosis/ natural history; counseling and coordination.

## 2016-11-21 NOTE — Progress Notes (Signed)
[place] OFFICE PROGRESS NOTE  Patient Care Team: Lada, Satira Anis, MD as PCP - General (Family Medicine) Manya Silvas, MD (Gastroenterology) Cammie Sickle, MD as Consulting Physician (Oncology) Lavonia Dana, MD as Consulting Physician (Nephrology)   SUMMARY OF ONCOLOGIC HISTORY:  Oncology History   1. 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 CT-guided biopsy on 02/10/11 - insufficient for definite diagnosis. Rare group of atypical cells. Got chemotherapy with Paclitaxel/Carboplatin (started on 02/14/11, completed cycle 6 day 8 on 08/22/11). Also got concurrent radiation. 01/18/12 - Underwent Preoperative bronchoscopy with bronchoalveolar lavage right upper lobe. Right thoracoscopy with pleural biopsy and talc pleurodesis. Negative for malignancy. PET scan on 03/29/12 IMPRESSION: Multifocal pleural activity in the right chest suggesting pleural tumor spread. Activity is also noted in the right hilar region suggesting persistent tumor in this region. April 04, 2012 - VERISTRAT test reported as 'GOOD'. Started Tarceva on 05/17/12. July 2014 - declining performance status, unable to tolerate low dose Tarceva, was on home hospice which was discontinued since patient doing steady.     Lung cancer [facility])    Initial Diagnosis    Lung cancer ([place])       Cancer of upper lobe of right lung ([place])   07/27/2015 Initial Diagnosis    Cancer of upper lobe of right lung ([place])        INTERVAL HISTORY:  A very pleasant 76 year old female patient with above history of recurrent/metastatic non-small cell lung cancer status  Currently in hospice is here for follow-up.  Patient continues to remain fairly steady at home.  Continues to have chronic shortness of breath especially with exertion.  However noticed that she is more  symptomatic recently.  She did get 2 units of PRBC transfusion approximately 2 months ago-with him to help her symptoms.   She uses a wheelchair for longer distances. She continues to wear oxygen. No fevers or chills.  Husband complains of poor appetite.  Positive for weight loss.  Chronic cough.  REVIEW OF SYSTEMS:  A complete 10 point review of system is done which is negative except mentioned above/history of present illness.   PAST MEDICAL HISTORY :  Past Medical History:  Diagnosis Date  . Anemia   . Anemia   . Atrial fibrillation ([place])    Dr. Humphrey Rolls, cardiologist  . CKD (chronic kidney disease) stage 4, GFR 15-29 ml/min ([place]) 05/01/2015  . Diabetes mellitus, type II ([place]) 10/05/2016  . Hypertension   . Hypothyroidism   . Lung cancer ([place])   . Lung cancer ([place])     PAST SURGICAL HISTORY :   Past Surgical History:  Procedure Laterality Date  . COLONOSCOPY N/A 10/11/2014   Performed by Manya Silvas, MD at [place]  . ESOPHAGOGASTRODUODENOSCOPY (EGD) N/A 10/10/2014   Performed by Manya Silvas, MD at [place]  . UPPER GASTROINTESTINAL ENDOSCOPY  02/24/14    FAMILY HISTORY :   Family History  Problem Relation Age of Onset  . Stomach cancer Mother   . Cancer Mother        unsure where but states "it spead all over"  . Alzheimer's disease Father   . Varicose Veins Daughter   . Diabetes Neg Hx   . Heart disease Neg Hx   . Hypertension Neg Hx   . Stroke Neg Hx   . COPD Neg Hx  SOCIAL HISTORY:   Social History   Tobacco Use  . Smoking status: Former Smoker    Packs/day: 0.75    Years: 30.00    Pack years: 22.50    Types: Cigarettes    Last attempt to quit: 01/03/2005    Years since quitting: 11.8  . Smokeless tobacco: Never Used  Substance Use Topics  . Alcohol use: No  . Drug use: No    ALLERGIES:  has No Known Allergies.  MEDICATIONS:  Current Outpatient Medications  Medication Sig Dispense Refill  . albuterol (PROVENTIL) (2.5 MG/3ML)  0.083% nebulizer solution USE 1 VIAL VIA NEBULIZER  EVERY 4 HOURS AS NEEDED FOR WHEEZING OR SHORTNESS OF  BREATH. 1440 mL 2  . aspirin EC 81 MG tablet Take 81 mg daily by mouth.    . benzonatate (TESSALON) 100 MG capsule Take 2 capsules by mouth three times daily. 180 capsule 0  . Calcium Carb-Cholecalciferol (CALCIUM 600/VITAMIN D3 PO) Take 2 tablets by mouth daily.     . digoxin (LANOXIN) 0.125 MG tablet Take 0.125 mg by mouth daily. On Mondays, Wednesdays and Fridays     . ferrous gluconate (FERATE) 240 (27 FE) MG tablet Take 240 mg by mouth 3 (three) times daily with meals.    Marland Kitchen levothyroxine (SYNTHROID, LEVOTHROID) 50 MCG tablet TAKE 1 TABLET BY MOUTH  DAILY FOR 5 DAYS EACH WEEK  AND TAKE ONE-HALF TABLET BY MOUTH DAILY FOR 2 DAYS EACH WEEK 78 tablet 2  . metoprolol tartrate (LOPRESSOR) 50 MG tablet Take 50 mg by mouth 2 (two) times daily.    . pantoprazole (PROTONIX) 40 MG tablet Take 40 mg by mouth daily.    . potassium chloride SA (K-DUR,KLOR-CON) 20 MEQ tablet Take 20 mEq by mouth daily.     . carvedilol (COREG) 12.5 MG tablet Take 12.5 mg by mouth daily.    . isosorbide mononitrate (IMDUR) 30 MG 24 hr tablet Take 30 mg by mouth daily.    Marland Kitchen linagliptin (TRADJENTA) 5 MG TABS tablet Take 1 tablet (5 mg total) by mouth daily. (Patient not taking: Reported on 11/21/2016) 90 tablet 1  . loratadine (CLARITIN) 10 MG tablet TAKE 1 TABLET BY MOUTH ONCE DAILY (Patient not taking: Reported on 11/21/2016) 30 tablet 1  . megestrol (MEGACE) 40 MG/ML suspension TAKE 15 MLS BY MOUTH DAILY (Patient not taking: Reported on 11/21/2016) 240 mL 1  . MUCINEX 600 MG 12 hr tablet TAKE 1 TABLET BY MOUTH TWICE DAILY (Patient not taking: Reported on 11/21/2016) 60 tablet 3  . Pseudoephedrine-DM-GG (ROBITUSSIN CF PO) Take 5 mLs by mouth as needed (cough).    . torsemide (DEMADEX) 10 MG tablet Take 10 mg by mouth daily.     Marland Kitchen VIRTUSSIN A/C 100-10 MG/5ML syrup TAKE ONE TEASPOONFULL BY MOUTH 3 TIMES DAILY AS NEEDED FOR  COUGH (Patient not taking: Reported on 11/21/2016) 240 mL 1   No current facility-administered medications for this visit.    Facility-Administered Medications Ordered in Other Visits  Medication Dose Route Frequency Provider Last Rate Last Dose  . 0.9 %  sodium chloride infusion   Intravenous Continuous Leia Alf, MD 20 mL/hr at 07/04/14 1150    . heparin lock flush 100 unit/mL  500 Units Intravenous Once Leia Alf, MD      . heparin lock flush 100 unit/mL  500 Units Intravenous Once Leia Alf, MD      . sodium chloride 0.9 % injection 10 mL  10 mL Intravenous PRN Leia Alf, MD      .  sodium chloride 0.9 % injection 10 mL  10 mL Intravenous PRN Leia Alf, MD   10 mL at 07/04/14 1145  . sodium chloride flush (NS) 0.9 % injection 10 mL  10 mL Intravenous PRN Charlaine Dalton R, MD   10 mL at 09/20/16 0900    PHYSICAL EXAMINATION: ECOG PERFORMANCE STATUS: 3 - Symptomatic, >50% confined to bed  BP (!) 106/58   Pulse 69   Resp 16   Wt 114 lb (51.7 kg)   BMI 24.67 kg/m   Filed Weights   11/21/16 1011  Weight: 114 lb (51.7 kg)    GENERAL:  Alert, no distress and comfortable. Thin built elderly frail woman in wheelchair accompanied by her husband. Positive for pallor.   OROPHARYNX: no thrush or ulceration; poor dentition  NECK: supple, no masses felt LYMPH:  no palpable lymphadenopathy in the cervical, axillary or inguinal regions LUNGS: Decreased breath sounds bilaterally at the bases. Scattered wheeze noted or crackles.  HEART/CVS: regular rate & rhythm and no murmurs; No lower extremity edema ABDOMEN:abdomen soft, non-tender and normal bowel sounds Musculoskeletal:no cyanosis of digits and no clubbing  PSYCH: alert & oriented x 3 with fluent speech. Tearful at times.  NEURO: no focal motor/sensory deficits SKIN:  no rashes or significant lesions.  LABORATORY DATA:  I have reviewed the data as listed    Component Value Date/Time   NA 130 (L)  11/21/2016 0940   NA 136 08/19/2014 1337   NA 135 (L) 02/08/2014 0844   K 3.5 11/21/2016 0940   K 4.0 02/08/2014 0844   CL 94 (L) 11/21/2016 0940   CL 105 02/08/2014 0844   CO2 27 11/21/2016 0940   CO2 21 02/08/2014 0844   GLUCOSE 234 (H) 11/21/2016 0940   GLUCOSE 157 (H) 02/08/2014 0844   BUN 35 (H) 11/21/2016 0940   BUN 32 (H) 08/19/2014 1337   BUN 15 02/08/2014 0844   CREATININE 1.71 (H) 11/21/2016 0940   CREATININE 1.01 (H) 09/30/2016 1134   CALCIUM 8.4 (L) 11/21/2016 0940   CALCIUM 8.8 02/08/2014 0844   PROT 8.3 (H) 11/21/2016 0940   PROT 7.9 12/06/2013 1453   ALBUMIN 2.3 (L) 11/21/2016 0940   ALBUMIN 2.8 (L) 12/06/2013 1453   AST 19 11/21/2016 0940   AST 16 12/06/2013 1453   ALT 8 (L) 11/21/2016 0940   ALT 12 (L) 12/06/2013 1453   ALKPHOS 70 11/21/2016 0940   ALKPHOS 82 12/06/2013 1453   BILITOT 0.6 11/21/2016 0940   BILITOT 0.3 12/06/2013 1453   GFRNONAA 28 (L) 11/21/2016 0940   GFRNONAA 55 (L) 09/30/2016 1134   GFRAA 33 (L) 11/21/2016 0940   GFRAA 64 09/30/2016 1134   No results found for: SPEP, UPEP  Lab Results  Component Value Date   WBC 6.2 11/21/2016   NEUTROABS 5.3 11/21/2016   HGB 8.0 (L) 11/21/2016   HCT 24.3 (L) 11/21/2016   MCV 84.0 11/21/2016   PLT 364 11/21/2016      Chemistry      Component Value Date/Time   NA 130 (L) 11/21/2016 0940   NA 136 08/19/2014 1337   NA 135 (L) 02/08/2014 0844   K 3.5 11/21/2016 0940   K 4.0 02/08/2014 0844   CL 94 (L) 11/21/2016 0940   CL 105 02/08/2014 0844   CO2 27 11/21/2016 0940   CO2 21 02/08/2014 0844   BUN 35 (H) 11/21/2016 0940   BUN 32 (H) 08/19/2014 1337   BUN 15 02/08/2014 0844   CREATININE  1.71 (H) 11/21/2016 0940   CREATININE 1.01 (H) 09/30/2016 1134      Component Value Date/Time   CALCIUM 8.4 (L) 11/21/2016 0940   CALCIUM 8.8 02/08/2014 0844   ALKPHOS 70 11/21/2016 0940   ALKPHOS 82 12/06/2013 1453   AST 19 11/21/2016 0940   AST 16 12/06/2013 1453   ALT 8 (L) 11/21/2016 0940   ALT  12 (L) 12/06/2013 1453   BILITOT 0.6 11/21/2016 0940   BILITOT 0.3 12/06/2013 1453      ASSESSMENT & PLAN:   Cancer of upper lobe of right lung ([place]) # Lung Cancer- non-small cell lung stage IV; s/p Opdivo q2 weeks x#4. PET scan 03/2015 showed improvement of right lower lobe lesion (2.2 x 2.5 compared to 3.5 x 3cm) with extension radiation changes in right lung. Patient's treatments were held due to declining performance status.  Patient is clinically stable with no obvious progression clinically. Hospice has followed for approximately 2 years. Continue with Hospice.   # Anemia - Hemoglobin today is 8.0; clinically symptomatic;Proceed with PRBC.   # Weight loss & decreased appetite- continue with boost/ensure/megace  #CKD- creatinine today is 1.7/ at baseline.    # port flush- performed by Hospice RN. Proceed with as scheduled.   # RTC in 2 months for labs/MD, and consideration for blood transfusion.   # 25 minutes face-to-face with the patient discussing the above plan of care; more than 50% of time spent on prognosis/ natural history; counseling and coordination.      Cammie Sickle, MD 11/22/2016 8:17 AM

## 2016-11-22 LAB — TYPE AND SCREEN
ABO/RH(D): AB POS
Antibody Screen: NEGATIVE
Unit division: 0

## 2016-11-22 LAB — BPAM RBC
Blood Product Expiration Date: 201812122359
ISSUE DATE / TIME: 201811191304
Unit Type and Rh: 1700

## 2016-11-25 ENCOUNTER — Other Ambulatory Visit: Payer: Self-pay | Admitting: Internal Medicine

## 2016-11-28 DIAGNOSIS — Z9981 Dependence on supplemental oxygen: Secondary | ICD-10-CM | POA: Diagnosis not present

## 2016-11-28 DIAGNOSIS — N189 Chronic kidney disease, unspecified: Secondary | ICD-10-CM | POA: Diagnosis not present

## 2016-11-28 DIAGNOSIS — C3411 Malignant neoplasm of upper lobe, right bronchus or lung: Secondary | ICD-10-CM | POA: Diagnosis not present

## 2016-11-28 DIAGNOSIS — I129 Hypertensive chronic kidney disease with stage 1 through stage 4 chronic kidney disease, or unspecified chronic kidney disease: Secondary | ICD-10-CM | POA: Diagnosis not present

## 2016-11-28 DIAGNOSIS — I4891 Unspecified atrial fibrillation: Secondary | ICD-10-CM | POA: Diagnosis not present

## 2016-11-28 DIAGNOSIS — R600 Localized edema: Secondary | ICD-10-CM | POA: Diagnosis not present

## 2016-11-29 DIAGNOSIS — I4891 Unspecified atrial fibrillation: Secondary | ICD-10-CM | POA: Diagnosis not present

## 2016-11-29 DIAGNOSIS — I129 Hypertensive chronic kidney disease with stage 1 through stage 4 chronic kidney disease, or unspecified chronic kidney disease: Secondary | ICD-10-CM | POA: Diagnosis not present

## 2016-11-29 DIAGNOSIS — C3411 Malignant neoplasm of upper lobe, right bronchus or lung: Secondary | ICD-10-CM | POA: Diagnosis not present

## 2016-11-29 DIAGNOSIS — N189 Chronic kidney disease, unspecified: Secondary | ICD-10-CM | POA: Diagnosis not present

## 2016-11-29 DIAGNOSIS — Z9981 Dependence on supplemental oxygen: Secondary | ICD-10-CM | POA: Diagnosis not present

## 2016-11-29 DIAGNOSIS — R600 Localized edema: Secondary | ICD-10-CM | POA: Diagnosis not present

## 2016-11-30 DIAGNOSIS — Z9981 Dependence on supplemental oxygen: Secondary | ICD-10-CM | POA: Diagnosis not present

## 2016-11-30 DIAGNOSIS — I129 Hypertensive chronic kidney disease with stage 1 through stage 4 chronic kidney disease, or unspecified chronic kidney disease: Secondary | ICD-10-CM | POA: Diagnosis not present

## 2016-11-30 DIAGNOSIS — R600 Localized edema: Secondary | ICD-10-CM | POA: Diagnosis not present

## 2016-11-30 DIAGNOSIS — N189 Chronic kidney disease, unspecified: Secondary | ICD-10-CM | POA: Diagnosis not present

## 2016-11-30 DIAGNOSIS — C3411 Malignant neoplasm of upper lobe, right bronchus or lung: Secondary | ICD-10-CM | POA: Diagnosis not present

## 2016-11-30 DIAGNOSIS — I4891 Unspecified atrial fibrillation: Secondary | ICD-10-CM | POA: Diagnosis not present

## 2016-12-07 ENCOUNTER — Other Ambulatory Visit: Payer: Self-pay | Admitting: Internal Medicine

## 2016-12-10 ENCOUNTER — Other Ambulatory Visit: Payer: Self-pay | Admitting: Nurse Practitioner

## 2016-12-20 ENCOUNTER — Telehealth: Payer: Self-pay

## 2016-12-20 ENCOUNTER — Telehealth: Payer: Self-pay | Admitting: Family Medicine

## 2016-12-20 MED ORDER — DOXYCYCLINE HYCLATE 100 MG PO TABS: 100.0000 mg | ORAL_TABLET | Freq: Two times a day (BID) | ORAL | 0 refills | Status: DC

## 2016-12-20 MED ORDER — FLUTICASONE PROPIONATE 50 MCG/ACT NA SUSP
2.0000 | Freq: Every day | NASAL | 6 refills | Status: DC
Start: 2016-12-20 — End: 2017-01-23

## 2016-12-20 NOTE — Telephone Encounter (Signed)
Copied from [place]. Topic: Quick Communication - See Telephone Encounter >> Dec 20, 2016 10:54 AM Ahmed Prima L wrote: CRM for notification. See Telephone encounter for:   12/20/16.  Pt's husband is calling in regards to what the hospice nurse called in for on 12/14.....  Calling to notify that patient developed cyst in groin area and need to know what course of treatment Dr. Sanda Klein suggests? Patient also on loratadine for allergies and patient's nose is still very runny so she would like to try something else for the allergies. Please advise. Call back is [phone].

## 2016-12-20 NOTE — Telephone Encounter (Signed)
Katrina Kramer, please use this note instead of other note Start antibiotics Start nasal spray Call if not getting better

## 2016-12-20 NOTE — Telephone Encounter (Signed)
Appointment please

## 2016-12-20 NOTE — Telephone Encounter (Signed)
Copied from [place]. Topic: General - Other >> Dec 16, 2016  1:55 PM Valla Leaver wrote: Reason for CRM: Mitzi, Rn, calling to notify that patient developed cyst in groin area and need to know what course of treatment Dr. Sanda Klein suggests? Patient also on loratadine for allergies and patient's nose is still very runny so she would like to try something else for the allergies.

## 2016-12-21 NOTE — Telephone Encounter (Signed)
Pt.notified

## 2016-12-21 NOTE — Telephone Encounter (Signed)
Dr. Sanda Klein said disregard

## 2016-12-22 ENCOUNTER — Other Ambulatory Visit: Payer: Self-pay | Admitting: Internal Medicine

## 2017-01-11 ENCOUNTER — Other Ambulatory Visit: Payer: Self-pay | Admitting: Internal Medicine

## 2017-01-19 ENCOUNTER — Telehealth: Payer: Self-pay | Admitting: *Deleted

## 2017-01-19 MED ORDER — MIRTAZAPINE 15 MG PO TABS: 7.5000 mg | ORAL_TABLET | Freq: Every day | ORAL | 2 refills | Status: DC

## 2017-01-19 NOTE — Telephone Encounter (Signed)
Dr. B agrees with change of megace to remeron 15 mg as suggested. Please send new script and inform hospice.

## 2017-01-19 NOTE — Telephone Encounter (Signed)
Hospice requesting to change patient's Megace to Remeron 15 mg, 1/2 tablet at hs. States Megace is no longer working and their pharmacy reccommends this. Please advise.

## 2017-01-21 ENCOUNTER — Telehealth: Payer: Self-pay | Admitting: Internal Medicine

## 2017-01-21 NOTE — Telephone Encounter (Signed)
Spoke to pharmacy re: cyproheptadine; new script given 2 mg/5 ml TID x10 days; await hospice recommedations.

## 2017-01-23 ENCOUNTER — Inpatient Hospital Stay: Attending: Internal Medicine | Admitting: Internal Medicine

## 2017-01-23 ENCOUNTER — Inpatient Hospital Stay

## 2017-01-23 ENCOUNTER — Other Ambulatory Visit: Payer: Self-pay

## 2017-01-23 ENCOUNTER — Encounter: Payer: Self-pay | Admitting: Internal Medicine

## 2017-01-23 ENCOUNTER — Other Ambulatory Visit: Payer: Self-pay | Admitting: *Deleted

## 2017-01-23 VITALS — BP 96/60 | HR 86 | Temp 97.2°F | Resp 22 | Ht <= 58 in | Wt 122.0 lb

## 2017-01-23 DIAGNOSIS — E039 Hypothyroidism, unspecified: Secondary | ICD-10-CM | POA: Diagnosis not present

## 2017-01-23 DIAGNOSIS — D649 Anemia, unspecified: Secondary | ICD-10-CM

## 2017-01-23 DIAGNOSIS — N184 Chronic kidney disease, stage 4 (severe): Secondary | ICD-10-CM | POA: Insufficient documentation

## 2017-01-23 DIAGNOSIS — Z79899 Other long term (current) drug therapy: Secondary | ICD-10-CM | POA: Insufficient documentation

## 2017-01-23 DIAGNOSIS — I129 Hypertensive chronic kidney disease with stage 1 through stage 4 chronic kidney disease, or unspecified chronic kidney disease: Secondary | ICD-10-CM | POA: Diagnosis not present

## 2017-01-23 DIAGNOSIS — C3411 Malignant neoplasm of upper lobe, right bronchus or lung: Secondary | ICD-10-CM

## 2017-01-23 DIAGNOSIS — Z87891 Personal history of nicotine dependence: Secondary | ICD-10-CM | POA: Diagnosis not present

## 2017-01-23 DIAGNOSIS — R634 Abnormal weight loss: Secondary | ICD-10-CM | POA: Insufficient documentation

## 2017-01-23 DIAGNOSIS — E1122 Type 2 diabetes mellitus with diabetic chronic kidney disease: Secondary | ICD-10-CM | POA: Insufficient documentation

## 2017-01-23 DIAGNOSIS — D509 Iron deficiency anemia, unspecified: Secondary | ICD-10-CM

## 2017-01-23 DIAGNOSIS — I4891 Unspecified atrial fibrillation: Secondary | ICD-10-CM | POA: Diagnosis not present

## 2017-01-23 DIAGNOSIS — Z7982 Long term (current) use of aspirin: Secondary | ICD-10-CM | POA: Insufficient documentation

## 2017-01-23 DIAGNOSIS — R630 Anorexia: Secondary | ICD-10-CM | POA: Insufficient documentation

## 2017-01-23 LAB — COMPREHENSIVE METABOLIC PANEL
ALK PHOS: 67 U/L (ref 38–126)
ALT: 8 U/L — AB (ref 14–54)
ANION GAP: 8 (ref 5–15)
AST: 18 U/L (ref 15–41)
Albumin: 2.3 g/dL — ABNORMAL LOW (ref 3.5–5.0)
BILIRUBIN TOTAL: 0.6 mg/dL (ref 0.3–1.2)
BUN: 44 mg/dL — ABNORMAL HIGH (ref 6–20)
CALCIUM: 8 mg/dL — AB (ref 8.9–10.3)
CO2: 23 mmol/L (ref 22–32)
CREATININE: 1.79 mg/dL — AB (ref 0.44–1.00)
Chloride: 100 mmol/L — ABNORMAL LOW (ref 101–111)
GFR calc non Af Amer: 27 mL/min — ABNORMAL LOW (ref >60)
GFR, EST AFRICAN AMERICAN: 31 mL/min — AB (ref >60)
GLUCOSE: 218 mg/dL — AB (ref 65–99)
Potassium: 4.1 mmol/L (ref 3.5–5.1)
SODIUM: 131 mmol/L — AB (ref 135–145)
TOTAL PROTEIN: 7.7 g/dL (ref 6.5–8.1)

## 2017-01-23 LAB — CBC WITH DIFFERENTIAL/PLATELET
BASOS ABS: 0 10*3/uL (ref 0–0.1)
Basophils Relative: 1 %
EOS ABS: 0 10*3/uL (ref 0–0.7)
EOS PCT: 0 %
HCT: 19.4 % — ABNORMAL LOW (ref 35.0–47.0)
Hemoglobin: 6.1 g/dL — ABNORMAL LOW (ref 12.0–16.0)
Lymphocytes Relative: 7 %
Lymphs Abs: 0.5 10*3/uL — ABNORMAL LOW (ref 1.0–3.6)
MCH: 27.4 pg (ref 26.0–34.0)
MCHC: 31.3 g/dL — ABNORMAL LOW (ref 32.0–36.0)
MCV: 87.4 fL (ref 80.0–100.0)
Monocytes Absolute: 0.5 10*3/uL (ref 0.2–0.9)
Monocytes Relative: 7 %
Neutro Abs: 6 10*3/uL (ref 1.4–6.5)
Neutrophils Relative %: 85 %
PLATELETS: 301 10*3/uL (ref 150–440)
RBC: 2.22 MIL/uL — ABNORMAL LOW (ref 3.80–5.20)
RDW: 20.6 % — ABNORMAL HIGH (ref 11.5–14.5)
WBC: 7 10*3/uL (ref 3.6–11.0)

## 2017-01-23 LAB — SAMPLE TO BLOOD BANK

## 2017-01-23 LAB — PREPARE RBC (CROSSMATCH)

## 2017-01-23 MED ORDER — SODIUM CHLORIDE 0.9% FLUSH
10.0000 mL | INTRAVENOUS | Status: AC | PRN
  Administered 2017-01-23: 10 mL
  Filled 2017-01-23: qty 10

## 2017-01-23 MED ORDER — LORATADINE 10 MG PO TABS: 10.0000 mg | ORAL_TABLET | Freq: Every day | ORAL | 6 refills | Status: AC

## 2017-01-23 MED ORDER — DIPHENHYDRAMINE HCL 25 MG PO CAPS
25.0000 mg | ORAL_CAPSULE | Freq: Once | ORAL | Status: AC
  Administered 2017-01-23: 25 mg via ORAL
  Filled 2017-01-23: qty 1

## 2017-01-23 MED ORDER — BENZONATATE 100 MG PO CAPS: ORAL_CAPSULE | ORAL | 6 refills | Status: AC

## 2017-01-23 MED ORDER — HEPARIN SOD (PORK) LOCK FLUSH 100 UNIT/ML IV SOLN
500.0000 [IU] | Freq: Every day | INTRAVENOUS | Status: AC | PRN
  Administered 2017-01-23: 500 [IU]
  Filled 2017-01-23: qty 5

## 2017-01-23 MED ORDER — CEPHALEXIN 500 MG PO CAPS: 500.0000 mg | ORAL_CAPSULE | Freq: Three times a day (TID) | ORAL | 0 refills | Status: DC

## 2017-01-23 MED ORDER — ACETAMINOPHEN 325 MG PO TABS
650.0000 mg | ORAL_TABLET | Freq: Once | ORAL | Status: AC
  Administered 2017-01-23: 650 mg via ORAL
  Filled 2017-01-23: qty 2

## 2017-01-23 MED ORDER — SODIUM CHLORIDE 0.9 % IV SOLN
250.0000 mL | Freq: Once | INTRAVENOUS | Status: AC
  Administered 2017-01-23: 250 mL via INTRAVENOUS
  Filled 2017-01-23: qty 250

## 2017-01-23 NOTE — Progress Notes (Signed)
[place] OFFICE PROGRESS NOTE  Patient Care Team: Lada, Satira Anis, MD as PCP - General (Family Medicine) Manya Silvas, MD (Gastroenterology) Cammie Sickle, MD as Consulting Physician (Oncology) Lavonia Dana, MD as Consulting Physician (Nephrology)   SUMMARY OF ONCOLOGIC HISTORY:  Oncology History   1. 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 CT-guided biopsy on 02/10/11 - insufficient for definite diagnosis. Rare group of atypical cells. Got chemotherapy with Paclitaxel/Carboplatin (started on 02/14/11, completed cycle 6 day 8 on 08/22/11). Also got concurrent radiation. 01/18/12 - Underwent Preoperative bronchoscopy with bronchoalveolar lavage right upper lobe. Right thoracoscopy with pleural biopsy and talc pleurodesis. Negative for malignancy. PET scan on 03/29/12 IMPRESSION: Multifocal pleural activity in the right chest suggesting pleural tumor spread. Activity is also noted in the right hilar region suggesting persistent tumor in this region. April 04, 2012 - VERISTRAT test reported as 'GOOD'. Started Tarceva on 05/17/12. July 2014 - declining performance status, unable to tolerate low dose Tarceva, was on home hospice which was discontinued since patient doing steady.     Lung cancer [facility])    Initial Diagnosis    Lung cancer ([place])       Cancer of upper lobe of right lung ([place])   07/27/2015 Initial Diagnosis    Cancer of upper lobe of right lung ([place])        INTERVAL HISTORY:  A very pleasant 77 year old female patient with above history of recurrent/metastatic non-small cell lung cancer status  Currently in hospice is here for follow-up.  As per the family patient noted to have worsening fatigue shortness of breath especially with exertion.  Patient got blood transfusion approximately 2 months  ago.  Continues to have chronic cough. She uses a wheelchair for longer distances. She continues to wear oxygen. No fevers or chills.  No blood in stools or black loose stools.  As per the husband patient-had discharge from folliculitis like lesion in the groin.  As per husband Megace not covered by insurance; she was given a prescription for Cyproheptadine-which she has not started yet.  REVIEW OF SYSTEMS:  A complete 10 point review of system is done which is negative except mentioned above/history of present illness.   PAST MEDICAL HISTORY :  Past Medical History:  Diagnosis Date  . Anemia   . Anemia   . Atrial fibrillation ([place])    Dr. Humphrey Rolls, cardiologist  . CKD (chronic kidney disease) stage 4, GFR 15-29 ml/min ([place]) 05/01/2015  . Diabetes mellitus, type II ([place]) 10/05/2016  . Hypertension   . Hypothyroidism   . Lung cancer ([place])   . Lung cancer ([place])     PAST SURGICAL HISTORY :   Past Surgical History:  Procedure Laterality Date  . COLONOSCOPY N/A 10/11/2014   Procedure: COLONOSCOPY;  Surgeon: Manya Silvas, MD;  Location: [facility] ENDOSCOPY;  Service: Endoscopy;  Laterality: N/A;  . ESOPHAGOGASTRODUODENOSCOPY N/A 10/10/2014   Procedure: ESOPHAGOGASTRODUODENOSCOPY (EGD);  Surgeon: Manya Silvas, MD;  Location: [facility] ENDOSCOPY;  Service: Endoscopy;  Laterality: N/A;  . UPPER GASTROINTESTINAL ENDOSCOPY  02/24/14    FAMILY HISTORY :   Family History  Problem Relation Age of Onset  . Stomach cancer Mother   . Cancer Mother        unsure where but states "it spead all over"  . Alzheimer's disease Father   . Varicose Veins Daughter   .  Diabetes Neg Hx   . Heart disease Neg Hx   . Hypertension Neg Hx   . Stroke Neg Hx   . COPD Neg Hx     SOCIAL HISTORY:   Social History   Tobacco Use  . Smoking status: Former Smoker    Packs/day: 0.75    Years: 30.00    Pack years: 22.50    Types: Cigarettes    Last attempt to quit: 01/03/2005    Years since quitting: 12.0  .  Smokeless tobacco: Never Used  Substance Use Topics  . Alcohol use: No  . Drug use: No    ALLERGIES:  has No Known Allergies.  MEDICATIONS:  Current Outpatient Medications  Medication Sig Dispense Refill  . albuterol (PROVENTIL) (2.5 MG/3ML) 0.083% nebulizer solution USE 1 VIAL VIA NEBULIZER  EVERY 4 HOURS AS NEEDED FOR WHEEZING OR SHORTNESS OF  BREATH. 1440 mL 2  . aspirin EC 81 MG tablet Take 81 mg daily by mouth.    . benzonatate (TESSALON) 100 MG capsule Take 2 capsules by mouth three times daily. 180 capsule 6  . Calcium Carb-Cholecalciferol (CALCIUM 600/VITAMIN D3 PO) Take 2 tablets by mouth daily.     . carvedilol (COREG) 12.5 MG tablet Take 12.5 mg by mouth daily.    . digoxin (LANOXIN) 0.125 MG tablet Take 0.125 mg by mouth daily. On Mondays, Wednesdays and Fridays     . ferrous gluconate (FERATE) 240 (27 FE) MG tablet Take 240 mg by mouth 3 (three) times daily with meals.    Marland Kitchen GNP COUGH DM ER 30 MG/5ML liquid TAKE 1 TEASPOONFUL EVERY 6 HOURS AS NEEDED FOR COUGH 296 mL 3  . isosorbide mononitrate (IMDUR) 30 MG 24 hr tablet Take 30 mg by mouth daily.    Marland Kitchen levothyroxine (SYNTHROID, LEVOTHROID) 50 MCG tablet TAKE 1 TABLET BY MOUTH  DAILY FOR 5 DAYS EACH WEEK  AND TAKE ONE-HALF TABLET BY MOUTH DAILY FOR 2 DAYS EACH WEEK 78 tablet 2  . loratadine (CLARITIN) 10 MG tablet Take 1 tablet (10 mg total) by mouth daily. 15 tablet 6  . metoprolol tartrate (LOPRESSOR) 50 MG tablet Take 50 mg by mouth 2 (two) times daily.    . mirtazapine (REMERON) 15 MG tablet Take 0.5 tablets (7.5 mg total) by mouth at bedtime. 15 tablet 2  . MUCINEX 600 MG 12 hr tablet TAKE 1 TABLET BY MOUTH TWICE DAILY 60 tablet 3  . pantoprazole (PROTONIX) 40 MG tablet Take 40 mg by mouth daily.    . potassium chloride SA (K-DUR,KLOR-CON) 20 MEQ tablet Take 20 mEq by mouth daily.     Marland Kitchen torsemide (DEMADEX) 10 MG tablet Take 10 mg by mouth daily.     Marland Kitchen amiodarone (PACERONE) 200 MG tablet Take 200 mg by mouth daily.    .  cephALEXin (KEFLEX) 500 MG capsule Take 1 capsule (500 mg total) by mouth 3 (three) times daily. 30 capsule 0  . linagliptin (TRADJENTA) 5 MG TABS tablet Take 1 tablet (5 mg total) by mouth daily. (Patient not taking: Reported on 11/21/2016) 90 tablet 1  . Pseudoephedrine-DM-GG (ROBITUSSIN CF PO) Take 5 mLs by mouth as needed (cough).    Marland Kitchen VIRTUSSIN A/C 100-10 MG/5ML syrup TAKE ONE TEASPOONFULL BY MOUTH 3 TIMES DAILY AS NEEDED FOR COUGH (Patient not taking: Reported on 01/23/2017) 236 mL 0   No current facility-administered medications for this visit.    Facility-Administered Medications Ordered in Other Visits  Medication Dose Route Frequency Provider Last Rate Last  Dose  . 0.9 %  sodium chloride infusion   Intravenous Continuous Leia Alf, MD 20 mL/hr at 07/04/14 1150    . heparin lock flush 100 unit/mL  500 Units Intravenous Once Leia Alf, MD      . heparin lock flush 100 unit/mL  500 Units Intravenous Once Leia Alf, MD      . sodium chloride 0.9 % injection 10 mL  10 mL Intravenous PRN Leia Alf, MD      . sodium chloride 0.9 % injection 10 mL  10 mL Intravenous PRN Leia Alf, MD   10 mL at 07/04/14 1145  . sodium chloride flush (NS) 0.9 % injection 10 mL  10 mL Intravenous PRN Cammie Sickle, MD   10 mL at 09/20/16 0900    PHYSICAL EXAMINATION: ECOG PERFORMANCE STATUS: 3 - Symptomatic, >50% confined to bed  BP 96/60 (BP Location: Left Arm, Patient Position: Sitting)   Pulse 86   Temp (!) 97.2 F (36.2 C) (Tympanic)   Resp (!) 22   Ht 4\' 9"  (1.448 m)   Wt 122 lb (55.3 kg)   BMI 26.40 kg/m   Filed Weights   01/23/17 0959  Weight: 122 lb (55.3 kg)    GENERAL:  Alert, no distress and comfortable. Thin built elderly frail woman in wheelchair accompanied by her husband. Positive for pallor.   OROPHARYNX: no thrush or ulceration; poor dentition  NECK: supple, no masses felt LYMPH:  no palpable lymphadenopathy in the cervical, axillary or  inguinal regions LUNGS: Decreased breath sounds bilaterally at the bases. Scattered wheeze noted or crackles.  HEART/CVS: regular rate & rhythm and no murmurs; No lower extremity edema ABDOMEN:abdomen soft, non-tender and normal bowel sounds Musculoskeletal:no cyanosis of digits and no clubbing  PSYCH: alert & oriented x 3 with fluent speech.  NEURO: no focal motor/sensory deficits SKIN: Folliculitis-like lesions bilateral groin.  LABORATORY DATA:  I have reviewed the data as listed    Component Value Date/Time   NA 131 (L) 01/23/2017 0943   NA 136 08/19/2014 1337   NA 135 (L) 02/08/2014 0844   K 4.1 01/23/2017 0943   K 4.0 02/08/2014 0844   CL 100 (L) 01/23/2017 0943   CL 105 02/08/2014 0844   CO2 23 01/23/2017 0943   CO2 21 02/08/2014 0844   GLUCOSE 218 (H) 01/23/2017 0943   GLUCOSE 157 (H) 02/08/2014 0844   BUN 44 (H) 01/23/2017 0943   BUN 32 (H) 08/19/2014 1337   BUN 15 02/08/2014 0844   CREATININE 1.79 (H) 01/23/2017 0943   CREATININE 1.01 (H) 09/30/2016 1134   CALCIUM 8.0 (L) 01/23/2017 0943   CALCIUM 8.8 02/08/2014 0844   PROT 7.7 01/23/2017 0943   PROT 7.9 12/06/2013 1453   ALBUMIN 2.3 (L) 01/23/2017 0943   ALBUMIN 2.8 (L) 12/06/2013 1453   AST 18 01/23/2017 0943   AST 16 12/06/2013 1453   ALT 8 (L) 01/23/2017 0943   ALT 12 (L) 12/06/2013 1453   ALKPHOS 67 01/23/2017 0943   ALKPHOS 82 12/06/2013 1453   BILITOT 0.6 01/23/2017 0943   BILITOT 0.3 12/06/2013 1453   GFRNONAA 27 (L) 01/23/2017 0943   GFRNONAA 55 (L) 09/30/2016 1134   GFRAA 31 (L) 01/23/2017 0943   GFRAA 64 09/30/2016 1134   No results found for: SPEP, UPEP  Lab Results  Component Value Date   WBC 7.0 01/23/2017   NEUTROABS 6.0 01/23/2017   HGB 6.1 (L) 01/23/2017   HCT 19.4 (L) 01/23/2017   MCV  87.4 01/23/2017   PLT 301 01/23/2017      Chemistry      Component Value Date/Time   NA 131 (L) 01/23/2017 0943   NA 136 08/19/2014 1337   NA 135 (L) 02/08/2014 0844   K 4.1 01/23/2017 0943    K 4.0 02/08/2014 0844   CL 100 (L) 01/23/2017 0943   CL 105 02/08/2014 0844   CO2 23 01/23/2017 0943   CO2 21 02/08/2014 0844   BUN 44 (H) 01/23/2017 0943   BUN 32 (H) 08/19/2014 1337   BUN 15 02/08/2014 0844   CREATININE 1.79 (H) 01/23/2017 0943   CREATININE 1.01 (H) 09/30/2016 1134      Component Value Date/Time   CALCIUM 8.0 (L) 01/23/2017 0943   CALCIUM 8.8 02/08/2014 0844   ALKPHOS 67 01/23/2017 0943   ALKPHOS 82 12/06/2013 1453   AST 18 01/23/2017 0943   AST 16 12/06/2013 1453   ALT 8 (L) 01/23/2017 0943   ALT 12 (L) 12/06/2013 1453   BILITOT 0.6 01/23/2017 0943   BILITOT 0.3 12/06/2013 1453      ASSESSMENT & PLAN:   Cancer of upper lobe of right lung ([place]) # Lung Cancer- non-small cell lung stage IV; s/p Opdivo q2 weeks x#4. PET scan 03/2015 showed improvement of right lower lobe lesion (2.2 x 2.5 compared to 3.5 x 3cm) with extension radiation changes in right lung. Patient's treatments were held due to declining performance status.  Patient is clinically stable with no obvious progression clinically. Hospice has followed for approximately 2 years. Continue with Hospice.   # Anemia - Hemoglobin today is 6.0 clinically symptomatic;Proceed with PRBC.   # Weight loss & decreased appetite- continue with boost/ensur;   #CKD- creatinine today is 1.7/ at baseline.    # draininging cyst-   # port flush- performed by Hospice RN. Proceed with as scheduled.   # RTC in 2 months for labs/MD, and consideration for blood transfusion.   # follow up in 2 weeks/cbc-HOLD tube.      Cammie Sickle, MD 01/24/2017 12:54 PM

## 2017-01-23 NOTE — Assessment & Plan Note (Addendum)
#   Lung Cancer- non-small cell lung stage IV; s/p Opdivo q2 weeks x#4. PET scan 03/2015 showed improvement of right lower lobe lesion (2.2 x 2.5 compared to 3.5 x 3cm) with extension radiation changes in right lung. Patient's treatments were held due to declining performance status.  Patient is clinically stable with no obvious progression clinically. Hospice has followed for approximately 2 years. Continue with Hospice.   # Anemia - Hemoglobin today is 6.0 clinically symptomatic; Proceed with PRBC.   # Weight loss & decreased appetite- continue with boost/ensure; given the issues with insurance for Megace; proceed with cyproheptadine [pt's husband will call]  #CKD- creatinine today is 1.7/ at baseline.    # draininging cyst-prescribed Keflex.  # port flush- performed by Hospice RN. Proceed with as scheduled.   # RTC in 2 months for labs/MD, and consideration for blood transfusion.   # 25 minutes face-to-face with the patient discussing the above plan of care; more than 50% of time spent on prognosis/ natural history; counseling and coordination.

## 2017-01-23 NOTE — Telephone Encounter (Signed)
I spoke with Horris Latino at [facility], she will have Mitzi call me tomorrow as they are closed for holiday today

## 2017-01-24 LAB — TYPE AND SCREEN
ABO/RH(D): AB POS
ANTIBODY SCREEN: NEGATIVE
Unit division: 0
Unit division: 0

## 2017-01-24 LAB — BPAM RBC
BLOOD PRODUCT EXPIRATION DATE: 201901312359
Blood Product Expiration Date: 201902062359
ISSUE DATE / TIME: 201901211204
ISSUE DATE / TIME: 201901211401
UNIT TYPE AND RH: 1700
UNIT TYPE AND RH: 6200

## 2017-01-24 NOTE — Telephone Encounter (Signed)
I spoke with Mitzi, RN with Hospice and she will contact patient husband and [place] to clear up any confusion. She will call me back afterwards

## 2017-01-24 NOTE — Telephone Encounter (Signed)
Mr Urizar called requesting  I call him back. I attempted, but it went to voice mail. I left him  A message that I was returning his call

## 2017-01-24 NOTE — Telephone Encounter (Signed)
Per Mitzi, Patient never started Cyproheptadine, husband was confused by label. She is using the Remeron and will trial it for 2 weeks and if that does not work, They would like to try Dexamethasone 4 mg daily next.

## 2017-01-25 IMAGING — CT CT CHEST W/ CM
2 of 3 series · 16 of 46 positions shown, 18 images · IV contrast (omnipaque)
Comparison: CT chest dated 07/02/2014

CLINICAL DATA: Follow-up right upper lobe lung cancer, status post
chemotherapy and radiation

EXAM:
CT CHEST WITH CONTRAST
TECHNIQUE: Multidetector CT imaging of the chest was performed during
intravenous contrast administration.
CONTRAST:  60mL OMNIPAQUE IOHEXOL 300 MG/ML  SOLN

[Series 2: routine chest with · axial · 0.60mm/px · z∈[-134,+106]mm · 13 of 56 slices shown, 15 images]
[im 4/56  soft-tissue]
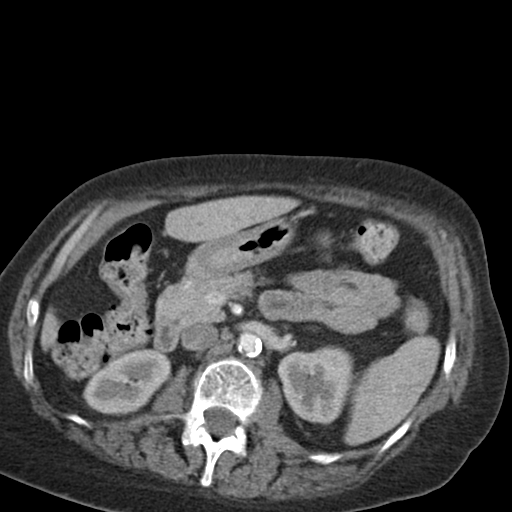
[im 4/56  bone]
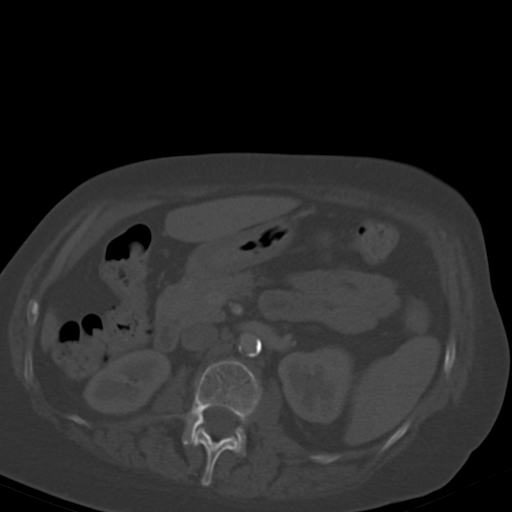
[im 8/56  soft-tissue]
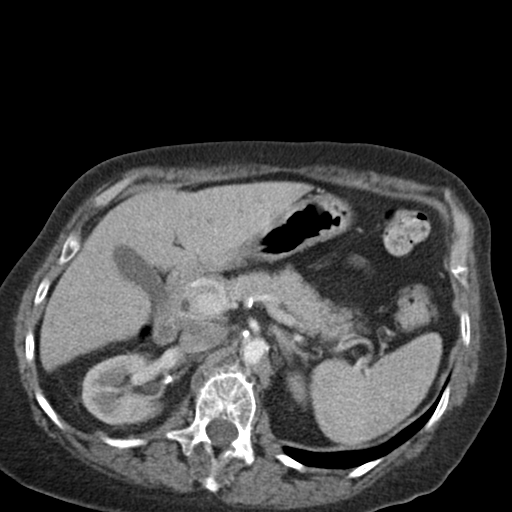
[im 11/56  soft-tissue]
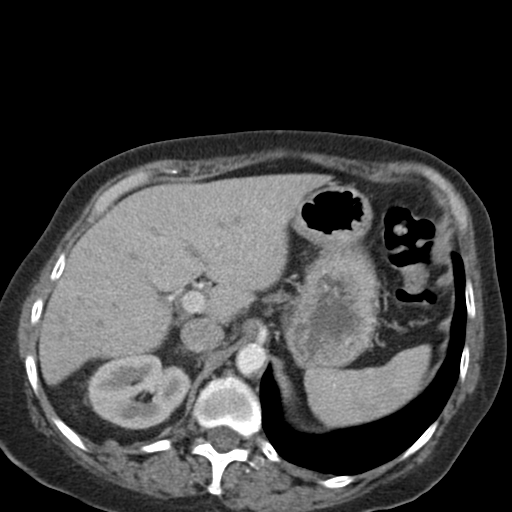
[im 16/56  soft-tissue]
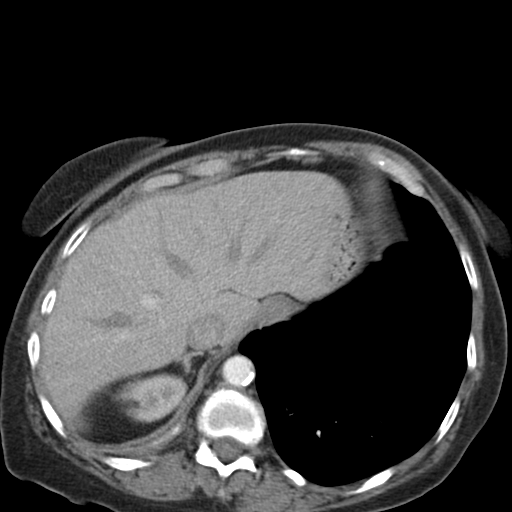
[im 20/56  soft-tissue]
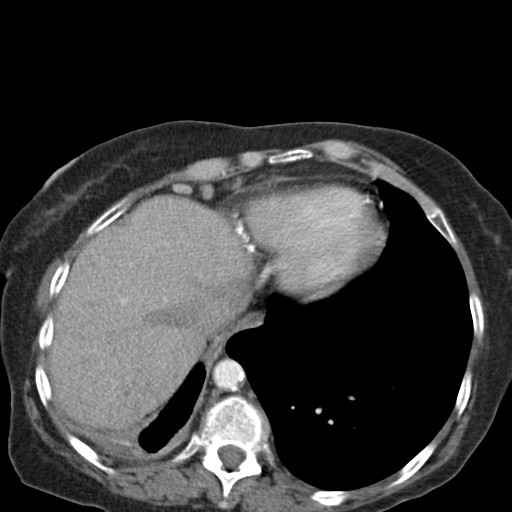
[im 24/56  soft-tissue]
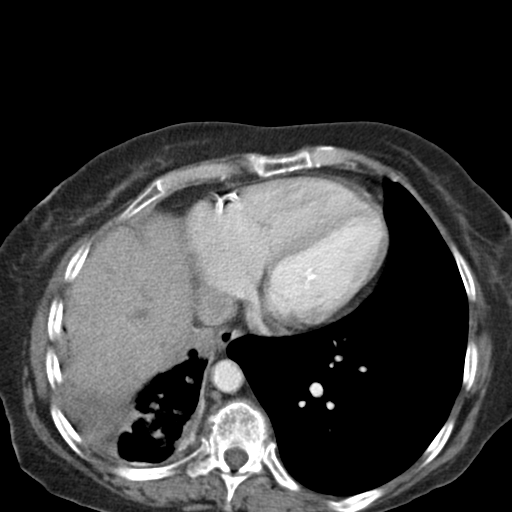
[im 29/56  soft-tissue]
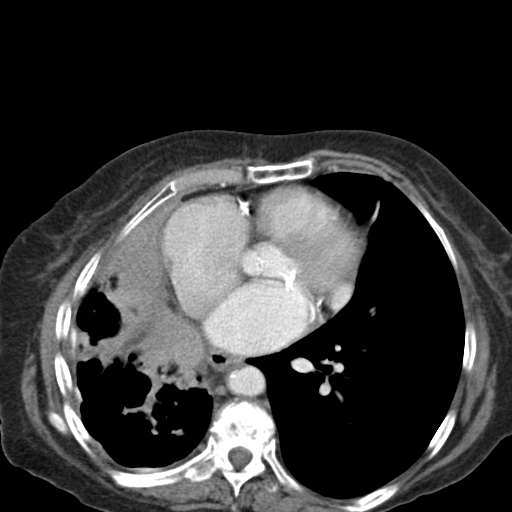
[im 32/56  soft-tissue]
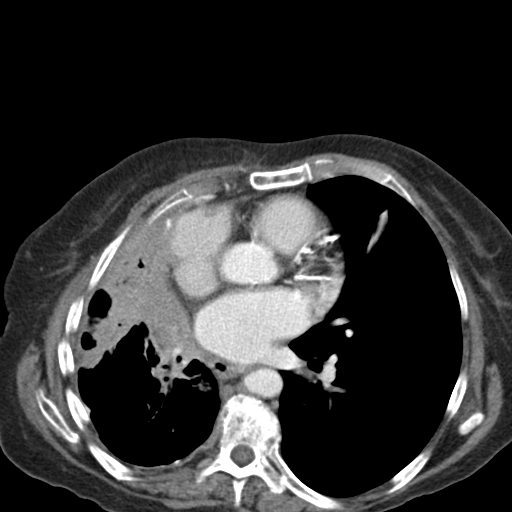
[im 36/56  soft-tissue]
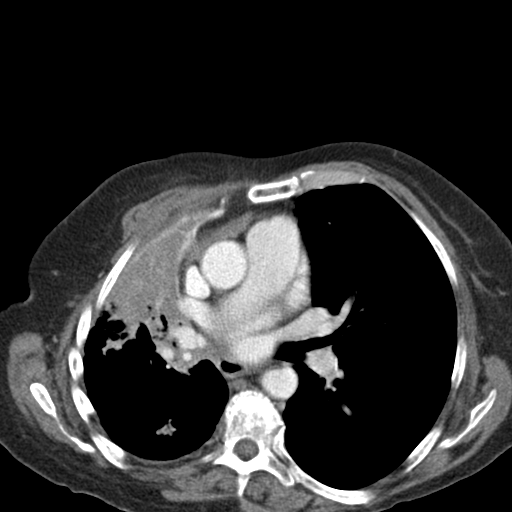
[im 36/56  bone]
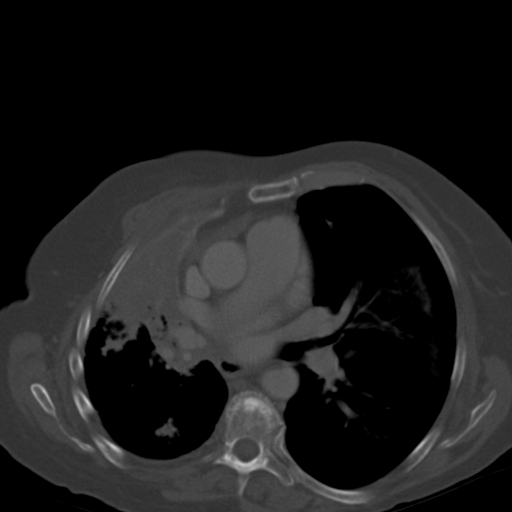
[im 40/56  soft-tissue]
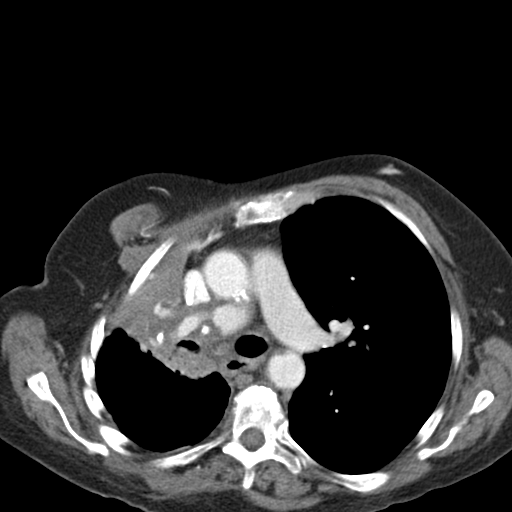
[im 45/56  soft-tissue]
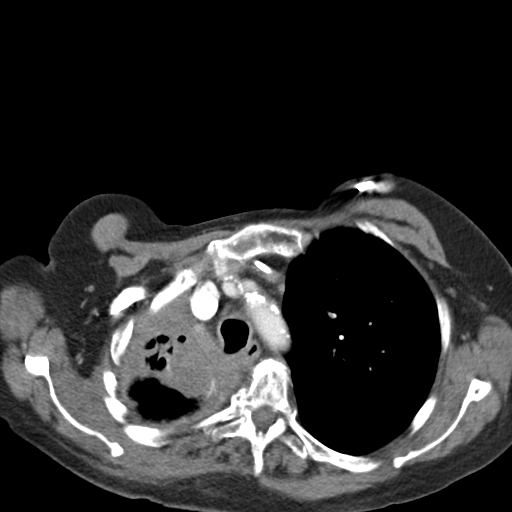
[im 48/56  soft-tissue]
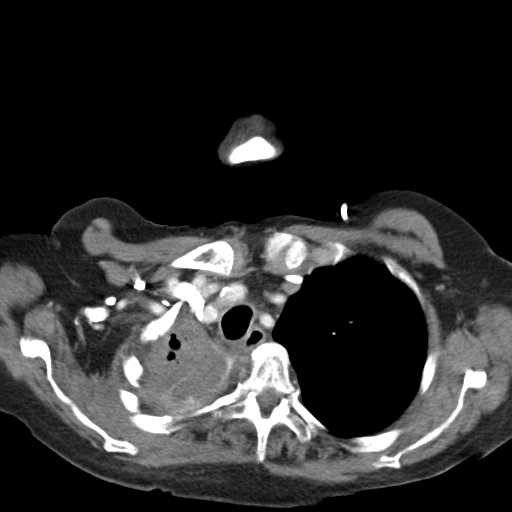
[im 52/56  soft-tissue]
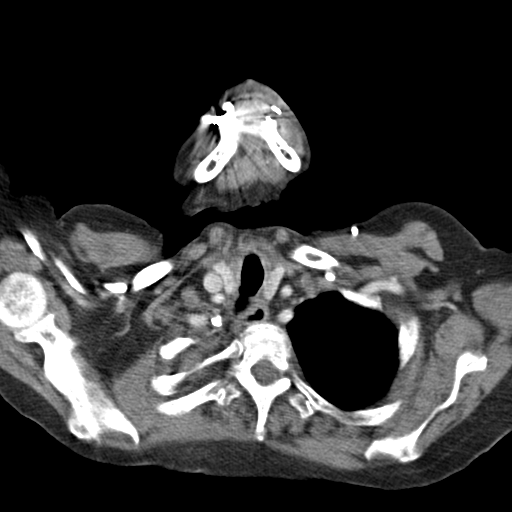

[Series 5: routine chest with cor · coronal · 0.57mm/px · 3 of 112 slices shown]
[im 38/112  soft-tissue]
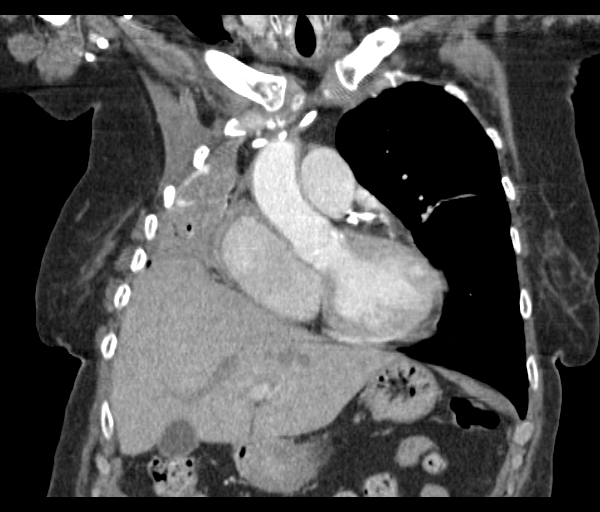
[im 50/112  soft-tissue]
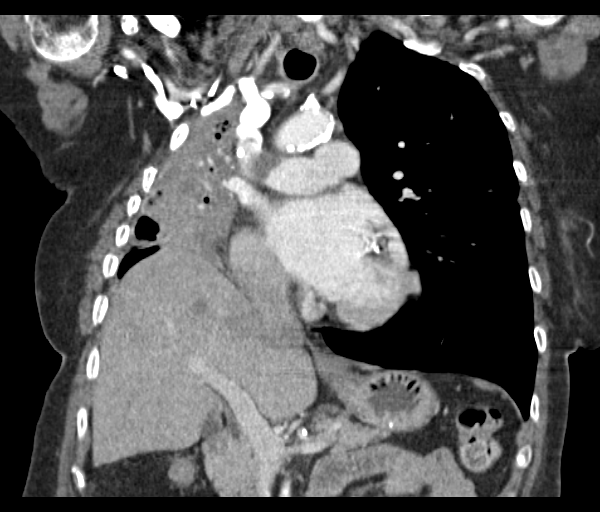
[im 62/112  soft-tissue]
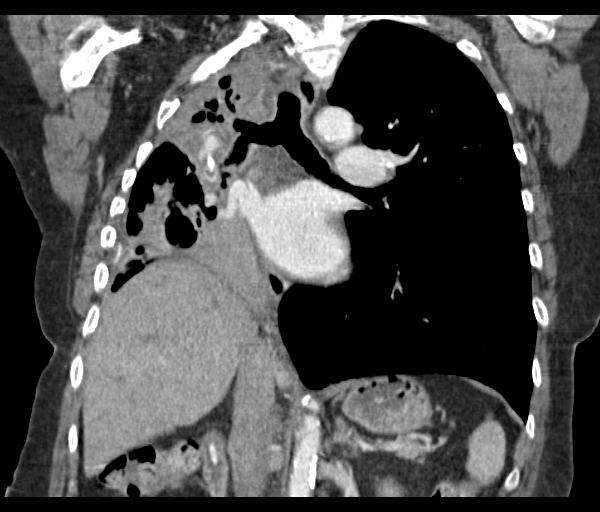

[16 of 46 positions shown; findings below may reference images not displayed]

FINDINGS: Mediastinum/Nodes: The heart is top-normal in size. No pericardial
effusion.

Coronary atherosclerosis.

Atherosclerotic calcifications of the aortic arch. Left chest port
terminating in the upper SVC.

Small right juxtadiaphragmatic nodes measuring up to 8 mm short
axis.

Visualized thyroid is unremarkable.

Lungs/Pleura: Left lung essentially clear, noting mild platelike
scarring/ atelectasis in the lingula.

Again noted is volume loss in the right upper and middle lobes,
likely reflecting radiation changes.

However, there is more rounded soft tissue in the central right
lower lobe/ infrahilar region (series 2/ image 29), which is
progressive when compared to prior studies. Additionally, the
inferior septal thickening noted on the prior study has also
progressed, now with associated multifocal patchy opacities in the
right lower lobe (series 3/ image 29).

Overall, these findings are highly suspicious for recurrent tumor
with lymphangitic spread.

Associated trace right pleural fluid.

No pneumothorax.

Upper abdomen: Visualized upper abdomen is notable for vascular
calcifications.

Musculoskeletal: Degenerative changes of the visualized
thoracolumbar spine.
IMPRESSION: Radiation changes in the right hemithorax.

Findings suspicious for recurrent tumor with lymphangitic spread in
the right infrahilar region/right lower lobe.

Associated small right juxtadiaphragmatic nodes measuring up to 8 mm
short axis.

These results were called by telephone at the time of interpretation
on 01/15/2015 at [DATE] to Dr. NIPA VACCARO, who verbally
acknowledged these results.

## 2017-01-30 ENCOUNTER — Other Ambulatory Visit: Payer: Self-pay | Admitting: Nurse Practitioner

## 2017-01-30 ENCOUNTER — Other Ambulatory Visit: Payer: Self-pay | Admitting: Internal Medicine

## 2017-02-14 ENCOUNTER — Other Ambulatory Visit: Payer: Self-pay

## 2017-02-14 ENCOUNTER — Emergency Department

## 2017-02-14 ENCOUNTER — Inpatient Hospital Stay
Admission: EM | Admit: 2017-02-14 | Discharge: 2017-02-18 | DRG: 193 | Disposition: A | Discharge status: Hospice | Attending: Internal Medicine | Admitting: Internal Medicine

## 2017-02-14 DIAGNOSIS — Z9981 Dependence on supplemental oxygen: Secondary | ICD-10-CM

## 2017-02-14 DIAGNOSIS — R042 Hemoptysis: Secondary | ICD-10-CM | POA: Diagnosis not present

## 2017-02-14 DIAGNOSIS — R0902 Hypoxemia: Secondary | ICD-10-CM

## 2017-02-14 DIAGNOSIS — Z800 Family history of malignant neoplasm of digestive organs: Secondary | ICD-10-CM | POA: Diagnosis not present

## 2017-02-14 DIAGNOSIS — J189 Pneumonia, unspecified organism: Principal | ICD-10-CM | POA: Diagnosis present

## 2017-02-14 DIAGNOSIS — J9621 Acute and chronic respiratory failure with hypoxia: Secondary | ICD-10-CM | POA: Diagnosis not present

## 2017-02-14 DIAGNOSIS — Z923 Personal history of irradiation: Secondary | ICD-10-CM | POA: Diagnosis not present

## 2017-02-14 DIAGNOSIS — R0602 Shortness of breath: Secondary | ICD-10-CM

## 2017-02-14 DIAGNOSIS — R64 Cachexia: Secondary | ICD-10-CM | POA: Diagnosis present

## 2017-02-14 DIAGNOSIS — Z9221 Personal history of antineoplastic chemotherapy: Secondary | ICD-10-CM | POA: Diagnosis not present

## 2017-02-14 DIAGNOSIS — Z79890 Hormone replacement therapy (postmenopausal): Secondary | ICD-10-CM | POA: Diagnosis not present

## 2017-02-14 DIAGNOSIS — D630 Anemia in neoplastic disease: Secondary | ICD-10-CM | POA: Diagnosis not present

## 2017-02-14 DIAGNOSIS — Z6826 Body mass index (BMI) 26.0-26.9, adult: Secondary | ICD-10-CM | POA: Diagnosis not present

## 2017-02-14 DIAGNOSIS — Z87891 Personal history of nicotine dependence: Secondary | ICD-10-CM | POA: Diagnosis not present

## 2017-02-14 DIAGNOSIS — C3491 Malignant neoplasm of unspecified part of right bronchus or lung: Secondary | ICD-10-CM | POA: Diagnosis present

## 2017-02-14 DIAGNOSIS — E039 Hypothyroidism, unspecified: Secondary | ICD-10-CM | POA: Diagnosis present

## 2017-02-14 DIAGNOSIS — I129 Hypertensive chronic kidney disease with stage 1 through stage 4 chronic kidney disease, or unspecified chronic kidney disease: Secondary | ICD-10-CM | POA: Diagnosis present

## 2017-02-14 DIAGNOSIS — E7800 Pure hypercholesterolemia, unspecified: Secondary | ICD-10-CM | POA: Diagnosis present

## 2017-02-14 DIAGNOSIS — Z66 Do not resuscitate: Secondary | ICD-10-CM | POA: Diagnosis not present

## 2017-02-14 DIAGNOSIS — D500 Iron deficiency anemia secondary to blood loss (chronic): Secondary | ICD-10-CM

## 2017-02-14 DIAGNOSIS — N184 Chronic kidney disease, stage 4 (severe): Secondary | ICD-10-CM | POA: Diagnosis present

## 2017-02-14 DIAGNOSIS — I4891 Unspecified atrial fibrillation: Secondary | ICD-10-CM | POA: Diagnosis present

## 2017-02-14 DIAGNOSIS — E11649 Type 2 diabetes mellitus with hypoglycemia without coma: Secondary | ICD-10-CM | POA: Diagnosis present

## 2017-02-14 DIAGNOSIS — Z820 Family history of epilepsy and other diseases of the nervous system: Secondary | ICD-10-CM | POA: Diagnosis not present

## 2017-02-14 DIAGNOSIS — Z7982 Long term (current) use of aspirin: Secondary | ICD-10-CM

## 2017-02-14 DIAGNOSIS — K219 Gastro-esophageal reflux disease without esophagitis: Secondary | ICD-10-CM | POA: Diagnosis present

## 2017-02-14 DIAGNOSIS — C7951 Secondary malignant neoplasm of bone: Secondary | ICD-10-CM | POA: Diagnosis not present

## 2017-02-14 DIAGNOSIS — G893 Neoplasm related pain (acute) (chronic): Secondary | ICD-10-CM

## 2017-02-14 LAB — CBC
HCT: 25.2 % — ABNORMAL LOW (ref 35.0–47.0)
HEMOGLOBIN: 8 g/dL — AB (ref 12.0–16.0)
MCH: 28 pg (ref 26.0–34.0)
MCHC: 31.8 g/dL — AB (ref 32.0–36.0)
MCV: 87.9 fL (ref 80.0–100.0)
PLATELETS: 237 10*3/uL (ref 150–440)
RBC: 2.86 MIL/uL — AB (ref 3.80–5.20)
RDW: 18.2 % — ABNORMAL HIGH (ref 11.5–14.5)
WBC: 6.4 10*3/uL (ref 3.6–11.0)

## 2017-02-14 LAB — BASIC METABOLIC PANEL
Anion gap: 7 (ref 5–15)
BUN: 31 mg/dL — ABNORMAL HIGH (ref 6–20)
CALCIUM: 8.5 mg/dL — AB (ref 8.9–10.3)
CHLORIDE: 104 mmol/L (ref 101–111)
CO2: 24 mmol/L (ref 22–32)
CREATININE: 1.28 mg/dL — AB (ref 0.44–1.00)
GFR calc non Af Amer: 40 mL/min — ABNORMAL LOW (ref >60)
GFR, EST AFRICAN AMERICAN: 46 mL/min — AB (ref >60)
GLUCOSE: 145 mg/dL — AB (ref 65–99)
Potassium: 4 mmol/L (ref 3.5–5.1)
Sodium: 135 mmol/L (ref 135–145)

## 2017-02-14 LAB — GLUCOSE, CAPILLARY
GLUCOSE-CAPILLARY: 40 mg/dL — AB (ref 65–99)
Glucose-Capillary: 136 mg/dL — ABNORMAL HIGH (ref 65–99)
Glucose-Capillary: 61 mg/dL — ABNORMAL LOW (ref 65–99)
Glucose-Capillary: 104 mg/dL — ABNORMAL HIGH (ref 65–99)
Glucose-Capillary: 165 mg/dL — ABNORMAL HIGH (ref 65–99)

## 2017-02-14 LAB — TROPONIN I

## 2017-02-14 LAB — INFLUENZA PANEL BY PCR (TYPE A & B)
Influenza A By PCR: NEGATIVE
Influenza B By PCR: NEGATIVE

## 2017-02-14 MED ORDER — CALCIUM CARBONATE-VITAMIN D 500-200 MG-UNIT PO TABS
1.0000 | ORAL_TABLET | Freq: Every day | ORAL | Status: DC
  Administered 2017-02-15 – 2017-02-18 (×4): 1 via ORAL
  Filled 2017-02-14 (×4): qty 1

## 2017-02-14 MED ORDER — DIGOXIN 125 MCG PO TABS
0.1250 mg | ORAL_TABLET | ORAL | Status: DC
  Administered 2017-02-15 – 2017-02-17 (×2): 0.125 mg via ORAL
  Filled 2017-02-14 (×2): qty 1

## 2017-02-14 MED ORDER — LEVOTHYROXINE SODIUM 50 MCG PO TABS
50.0000 ug | ORAL_TABLET | ORAL | Status: DC
  Administered 2017-02-15 – 2017-02-18 (×3): 50 ug via ORAL
  Filled 2017-02-14 (×3): qty 1

## 2017-02-14 MED ORDER — INSULIN ASPART 100 UNIT/ML ~~LOC~~ SOLN
0.0000 [IU] | Freq: Three times a day (TID) | SUBCUTANEOUS | Status: DC
  Administered 2017-02-14: 9 [IU] via SUBCUTANEOUS
  Filled 2017-02-14: qty 1

## 2017-02-14 MED ORDER — CALCIUM CARB-CHOLECALCIFEROL 600-800 MG-UNIT PO TABS: ORAL_TABLET | Freq: Every day | ORAL | Status: DC

## 2017-02-14 MED ORDER — HEPARIN SODIUM (PORCINE) 5000 UNIT/ML IJ SOLN
5000.0000 [IU] | Freq: Three times a day (TID) | INTRAMUSCULAR | Status: DC
  Administered 2017-02-14 – 2017-02-16 (×5): 5000 [IU] via SUBCUTANEOUS
  Filled 2017-02-14 (×5): qty 1

## 2017-02-14 MED ORDER — SODIUM CHLORIDE 0.9 % IV SOLN
1.0000 g | Freq: Once | INTRAVENOUS | Status: AC
  Administered 2017-02-14: 1 g via INTRAVENOUS
  Filled 2017-02-14: qty 10

## 2017-02-14 MED ORDER — IPRATROPIUM-ALBUTEROL 0.5-2.5 (3) MG/3ML IN SOLN
3.0000 mL | Freq: Once | RESPIRATORY_TRACT | Status: AC
  Administered 2017-02-14: 3 mL via RESPIRATORY_TRACT
  Filled 2017-02-14: qty 3

## 2017-02-14 MED ORDER — MIRTAZAPINE 15 MG PO TABS
7.5000 mg | ORAL_TABLET | Freq: Every day | ORAL | Status: DC
  Administered 2017-02-14 – 2017-02-17 (×4): 7.5 mg via ORAL
  Filled 2017-02-14 (×4): qty 1

## 2017-02-14 MED ORDER — LEVOTHYROXINE SODIUM 50 MCG PO TABS: 25.0000 ug | ORAL_TABLET | Freq: Every day | ORAL | Status: DC

## 2017-02-14 MED ORDER — ONDANSETRON HCL 4 MG/2ML IJ SOLN: 4.0000 mg | Freq: Four times a day (QID) | INTRAMUSCULAR | Status: DC | PRN

## 2017-02-14 MED ORDER — SODIUM CHLORIDE 0.9 % IV SOLN
INTRAVENOUS | Status: DC
  Administered 2017-02-14: 14:00:00 via INTRAVENOUS

## 2017-02-14 MED ORDER — ISOSORBIDE MONONITRATE ER 30 MG PO TB24
30.0000 mg | ORAL_TABLET | Freq: Every day | ORAL | Status: DC
  Administered 2017-02-14 – 2017-02-18 (×4): 30 mg via ORAL
  Filled 2017-02-14 (×5): qty 1

## 2017-02-14 MED ORDER — AZITHROMYCIN 250 MG PO TABS
500.0000 mg | ORAL_TABLET | Freq: Every day | ORAL | Status: DC
  Administered 2017-02-15 – 2017-02-18 (×4): 500 mg via ORAL
  Filled 2017-02-14 (×4): qty 2

## 2017-02-14 MED ORDER — IPRATROPIUM-ALBUTEROL 0.5-2.5 (3) MG/3ML IN SOLN
3.0000 mL | RESPIRATORY_TRACT | Status: DC
  Administered 2017-02-14 (×2): 3 mL via RESPIRATORY_TRACT
  Filled 2017-02-14 (×2): qty 3

## 2017-02-14 MED ORDER — DOCUSATE SODIUM 100 MG PO CAPS: 100.0000 mg | ORAL_CAPSULE | Freq: Two times a day (BID) | ORAL | Status: DC | PRN

## 2017-02-14 MED ORDER — PANTOPRAZOLE SODIUM 40 MG PO TBEC
40.0000 mg | DELAYED_RELEASE_TABLET | Freq: Every day | ORAL | Status: DC
  Administered 2017-02-14 – 2017-02-18 (×5): 40 mg via ORAL
  Filled 2017-02-14 (×5): qty 1

## 2017-02-14 MED ORDER — IPRATROPIUM-ALBUTEROL 0.5-2.5 (3) MG/3ML IN SOLN
3.0000 mL | Freq: Four times a day (QID) | RESPIRATORY_TRACT | Status: DC
  Administered 2017-02-14 – 2017-02-18 (×13): 3 mL via RESPIRATORY_TRACT
  Filled 2017-02-14 (×14): qty 3

## 2017-02-14 MED ORDER — ACETAMINOPHEN 325 MG PO TABS
650.0000 mg | ORAL_TABLET | Freq: Four times a day (QID) | ORAL | Status: DC | PRN
  Administered 2017-02-17: 650 mg via ORAL
  Filled 2017-02-14: qty 2

## 2017-02-14 MED ORDER — SODIUM CHLORIDE 0.9 % IV SOLN
1.0000 g | INTRAVENOUS | Status: DC
  Administered 2017-02-15 – 2017-02-17 (×3): 1 g via INTRAVENOUS
  Filled 2017-02-14 (×4): qty 10

## 2017-02-14 MED ORDER — LEVOTHYROXINE SODIUM 50 MCG PO TABS
25.0000 ug | ORAL_TABLET | ORAL | Status: DC
  Administered 2017-02-16: 25 ug via ORAL
  Filled 2017-02-14: qty 1

## 2017-02-14 MED ORDER — FERROUS GLUCONATE 324 (38 FE) MG PO TABS
324.0000 mg | ORAL_TABLET | Freq: Three times a day (TID) | ORAL | Status: DC
  Administered 2017-02-14 – 2017-02-17 (×10): 324 mg via ORAL
  Filled 2017-02-14 (×14): qty 1

## 2017-02-14 MED ORDER — BENZONATATE 100 MG PO CAPS: 100.0000 mg | ORAL_CAPSULE | Freq: Three times a day (TID) | ORAL | Status: DC | PRN

## 2017-02-14 MED ORDER — SODIUM CHLORIDE 0.9 % IV SOLN
500.0000 mg | Freq: Once | INTRAVENOUS | Status: AC
  Administered 2017-02-14: 500 mg via INTRAVENOUS
  Filled 2017-02-14: qty 500

## 2017-02-14 MED ORDER — ASPIRIN EC 81 MG PO TBEC
81.0000 mg | DELAYED_RELEASE_TABLET | Freq: Every day | ORAL | Status: DC
  Administered 2017-02-14 – 2017-02-18 (×5): 81 mg via ORAL
  Filled 2017-02-14 (×5): qty 1

## 2017-02-14 MED ORDER — LORATADINE 10 MG PO TABS
10.0000 mg | ORAL_TABLET | Freq: Every day | ORAL | Status: DC
  Administered 2017-02-14 – 2017-02-18 (×5): 10 mg via ORAL
  Filled 2017-02-14 (×5): qty 1

## 2017-02-14 MED ORDER — FERROUS GLUCONATE 240 (27 FE) MG PO TABS: 240.0000 mg | ORAL_TABLET | Freq: Three times a day (TID) | ORAL | Status: DC

## 2017-02-14 MED ORDER — AMIODARONE HCL 200 MG PO TABS
200.0000 mg | ORAL_TABLET | Freq: Every day | ORAL | Status: DC
  Administered 2017-02-14 – 2017-02-18 (×5): 200 mg via ORAL
  Filled 2017-02-14 (×5): qty 1

## 2017-02-14 NOTE — Progress Notes (Signed)
Hypoglycemic Event  CBG: 61  Treatment: Orange juice   Symptoms: None  Follow-up CBG: Time: 1749 CBG Result: 104  Possible Reasons for Event: Inadequate meal intake  Comments/MD notified:    Francesco Sor

## 2017-02-14 NOTE — Progress Notes (Signed)
Per MD okay for RN to DC droplet precautions, pt was negative for the flu.

## 2017-02-14 NOTE — ED Notes (Signed)
Resp notified [facility] sent to lab

## 2017-02-14 NOTE — ED Triage Notes (Signed)
Patient has hx of lung cancer, remission for 3 years.

## 2017-02-14 NOTE — ED Notes (Signed)
Report called to [place] rn floor nurse

## 2017-02-14 NOTE — H&P (Signed)
[facility] NAME: Katrina Kramer    MR#:  [id number]  DATE OF BIRTH:  September 29, 1940  DATE OF ADMISSION:  02/14/2017  PRIMARY CARE PHYSICIAN: Arnetha Courser, MD   REQUESTING/REFERRING PHYSICIAN: webster  CHIEF COMPLAINT:   Chief Complaint  Patient presents with  . Hemoptysis  . Shortness of Breath    HISTORY OF PRESENT ILLNESS: Katrina Kramer  is a 77 y.o. female with a known history of atrial fibrillation, chronic kidney disease, diabetes, hypertension, hypothyroidism, lung cancer- treated in 2013 with chemotherapy and radiation and she is in remission since then. For last 3-4 days she has increasing cough with some clear sputum production initially but it is worsening now with some blood streaks and sticky sputum now. She also feels increasingly short of breath and so husband has to increase her oxygen at home from 2 L to 2 and half to 3 L. She also normally does not require any support and walks independently in house but last 2-3 days feels some short of breath and weak on walking around. In ER she is noted to have pneumonia and because of her history of lung cancer and feeling short of breath and requiring higher oxygen ER physician suggested to admit to hospitalist team.  PAST MEDICAL HISTORY:   Past Medical History:  Diagnosis Date  . Anemia   . Anemia   . Atrial fibrillation ([place])    Dr. Humphrey Rolls, cardiologist  . CKD (chronic kidney disease) stage 4, GFR 15-29 ml/min ([place]) 05/01/2015  . Diabetes mellitus, type II ([place]) 10/05/2016  . Hypertension   . Hypothyroidism   . Lung cancer ([place])   . Lung cancer ([place])     PAST SURGICAL HISTORY:  Past Surgical History:  Procedure Laterality Date  . COLONOSCOPY N/A 10/11/2014   Procedure: COLONOSCOPY;  Surgeon: Manya Silvas, MD;  Location: [facility] ENDOSCOPY;  Service: Endoscopy;  Laterality: N/A;  . ESOPHAGOGASTRODUODENOSCOPY N/A 10/10/2014   Procedure: ESOPHAGOGASTRODUODENOSCOPY (EGD);   Surgeon: Manya Silvas, MD;  Location: [facility] ENDOSCOPY;  Service: Endoscopy;  Laterality: N/A;  . UPPER GASTROINTESTINAL ENDOSCOPY  02/24/14    SOCIAL HISTORY:  Social History   Tobacco Use  . Smoking status: Former Smoker    Packs/day: 0.75    Years: 30.00    Pack years: 22.50    Types: Cigarettes    Last attempt to quit: 01/03/2005    Years since quitting: 12.1  . Smokeless tobacco: Never Used  Substance Use Topics  . Alcohol use: No    FAMILY HISTORY:  Family History  Problem Relation Age of Onset  . Stomach cancer Mother   . Cancer Mother        unsure where but states "it spead all over"  . Alzheimer's disease Father   . Varicose Veins Daughter   . Diabetes Neg Hx   . Heart disease Neg Hx   . Hypertension Neg Hx   . Stroke Neg Hx   . COPD Neg Hx     DRUG ALLERGIES: No Known Allergies  REVIEW OF SYSTEMS:   CONSTITUTIONAL: No fever, positive for fatigue or weakness.  EYES: No blurred or double vision.  EARS, NOSE, AND THROAT: No tinnitus or ear pain.  RESPIRATORY: She have cough, shortness of breath, no wheezing or hemoptysis.  CARDIOVASCULAR: No chest pain, orthopnea, edema.  GASTROINTESTINAL: No nausea, vomiting, diarrhea or abdominal pain.  GENITOURINARY: No dysuria, hematuria.  ENDOCRINE: No polyuria, nocturia,  HEMATOLOGY: No anemia, easy  bruising or bleeding SKIN: No rash or lesion. MUSCULOSKELETAL: No joint pain or arthritis.   NEUROLOGIC: No tingling, numbness, weakness.  PSYCHIATRY: No anxiety or depression.   MEDICATIONS AT HOME:  Prior to Admission medications   Medication Sig Start Date End Date Taking? Authorizing Provider  albuterol (PROVENTIL) (2.5 MG/3ML) 0.083% nebulizer solution USE 1 VIAL VIA NEBULIZER  EVERY 4 HOURS AS NEEDED FOR WHEEZING OR SHORTNESS OF  BREATH. 11/07/16  Yes Cammie Sickle, MD  amiodarone (PACERONE) 200 MG tablet Take 200 mg by mouth daily.   Yes [provider]  aspirin EC 81 MG tablet Take 81 mg daily  by mouth.   Yes [provider]  benzonatate (TESSALON) 100 MG capsule Take 2 capsules by mouth three times daily. 01/23/17  Yes Cammie Sickle, MD  Calcium Carb-Cholecalciferol (CALCIUM 600/VITAMIN D3 PO) Take 2 tablets by mouth daily.    Yes [provider]  carvedilol (COREG) 12.5 MG tablet Take 12.5 mg by mouth daily.   Yes [provider]  digoxin (LANOXIN) 0.125 MG tablet Take 0.125 mg by mouth daily. On Mondays, Wednesdays and Fridays    Yes [provider]  ferrous gluconate (FERATE) 240 (27 FE) MG tablet Take 240 mg by mouth 3 (three) times daily with meals.   Yes [provider]  GNP COUGH DM ER 30 MG/5ML liquid TAKE 1 TEASPOONFUL EVERY 6 HOURS AS NEEDED FOR COUGH 01/30/17  Yes Cammie Sickle, MD  isosorbide mononitrate (IMDUR) 30 MG 24 hr tablet Take 30 mg by mouth daily.   Yes [provider]  levothyroxine (SYNTHROID, LEVOTHROID) 50 MCG tablet TAKE 1 TABLET BY MOUTH  DAILY FOR 5 DAYS EACH WEEK  AND TAKE ONE-HALF TABLET BY MOUTH DAILY FOR 2 DAYS EACH WEEK Patient taking differently: Take 25-50 mcg by mouth daily before breakfast. Take 50 mcg by mouth on Monday, Wednesday, Friday, Saturday and Sunday. Take 25 mcg by mouth on Tuesday and Thursday. 09/21/16  Yes Lada, Satira Anis, MD  loratadine (CLARITIN) 10 MG tablet Take 1 tablet (10 mg total) by mouth daily. 01/23/17  Yes Cammie Sickle, MD  metoprolol tartrate (LOPRESSOR) 50 MG tablet Take 50 mg by mouth 2 (two) times daily.   Yes [provider]  mirtazapine (REMERON) 15 MG tablet Take 0.5 tablets (7.5 mg total) by mouth at bedtime. 01/19/17  Yes Cammie Sickle, MD  MUCINEX 600 MG 12 hr tablet TAKE 1 TABLET BY MOUTH TWICE DAILY 09/08/16  Yes Cammie Sickle, MD  pantoprazole (PROTONIX) 40 MG tablet Take 40 mg by mouth daily.   Yes [provider]  potassium chloride SA (K-DUR,KLOR-CON) 20 MEQ tablet Take 20 mEq by mouth daily.    Yes  [provider]  Pseudoephedrine-DM-GG (ROBITUSSIN CF PO) Take 5 mLs by mouth as needed (cough).   Yes [provider]  ROBAFEN AC 100-10 MG/5ML syrup TAKE 1 TEASPOONSFUL BY MOUTH 3 TIMES DAILY AS NEEDED COUGH 01/30/17  Yes Verlon Au, NP  torsemide (DEMADEX) 10 MG tablet Take 10 mg by mouth daily.    Yes [provider]  linagliptin (TRADJENTA) 5 MG TABS tablet Take 1 tablet (5 mg total) by mouth daily. Patient not taking: Reported on 11/21/2016 10/05/16   Arnetha Courser, MD      PHYSICAL EXAMINATION:   VITAL SIGNS: Blood pressure 132/69, pulse (!) 105, temperature 97.8 F (36.6 C), temperature source Oral, resp. rate 12, weight 54.4 kg (120 lb), SpO2 94 %.  GENERAL:  77 y.o.-year-old patient lying in the bed with no acute distress.  EYES: Pupils equal, round, reactive to light and accommodation. No scleral icterus. Extraocular muscles intact.  HEENT: Head atraumatic, normocephalic. Oropharynx and nasopharynx clear.  NECK:  Supple, no jugular venous distention. No thyroid enlargement, no tenderness.  LUNGS: Decreased sounds on the right side lower lobe, no wheezing, some crepitation. No use of accessory muscles of respiration. On nasal cannula oxygen. CARDIOVASCULAR: S1, S2 normal. No murmurs, rubs, or gallops.  ABDOMEN: Soft, nontender, nondistended. Bowel sounds present. No organomegaly or mass.  EXTREMITIES: No pedal edema, cyanosis, or clubbing.  NEUROLOGIC: Cranial nerves II through XII are intact. Muscle strength 4-5/5 in all extremities. Sensation intact. Gait not checked.  PSYCHIATRIC: The patient is alert and oriented x 3.  SKIN: No obvious rash, lesion, or ulcer.   LABORATORY PANEL:   CBC Recent Labs  Lab 02/14/17 0619  WBC 6.4  HGB 8.0*  HCT 25.2*  PLT 237  MCV 87.9  MCH 28.0  MCHC 31.8*  RDW 18.2*   ------------------------------------------------------------------------------------------------------------------  Chemistries   Recent Labs  Lab 02/14/17 0619  NA 135  K 4.0  CL 104  CO2 24  GLUCOSE 145*  BUN 31*  CREATININE 1.28*  CALCIUM 8.5*   ------------------------------------------------------------------------------------------------------------------ estimated creatinine clearance is 26.9 mL/min (A) (by C-G formula based on SCr of 1.28 mg/dL (H)). ------------------------------------------------------------------------------------------------------------------ No results for input(s): TSH, T4TOTAL, T3FREE, THYROIDAB in the last 72 hours.  Invalid input(s): FREET3   Coagulation profile No results for input(s): INR, PROTIME in the last 168 hours. ------------------------------------------------------------------------------------------------------------------- No results for input(s): DDIMER in the last 72 hours. -------------------------------------------------------------------------------------------------------------------  Cardiac Enzymes Recent Labs  Lab 02/14/17 0619  TROPONINI <0.03   ------------------------------------------------------------------------------------------------------------------ Invalid input(s): POCBNP  ---------------------------------------------------------------------------------------------------------------  Urinalysis    Component Value Date/Time   COLORURINE STRAW (A) 04/21/2015 0902   APPEARANCEUR CLEAR (A) 04/21/2015 0902   LABSPEC 1.005 04/21/2015 0902   PHURINE 7.0 04/21/2015 0902   GLUCOSEU NEGATIVE 04/21/2015 0902   HGBUR NEGATIVE 04/21/2015 0902   BILIRUBINUR NEGATIVE 04/21/2015 0902   KETONESUR NEGATIVE 04/21/2015 0902   PROTEINUR NEGATIVE 04/21/2015 0902   NITRITE NEGATIVE 04/21/2015 0902   LEUKOCYTESUR NEGATIVE 04/21/2015 0902     RADIOLOGY: Dg Chest 2 View  Result Date: 02/14/2017 CLINICAL DATA:  Hemoptysis.  Shortness of breath. EXAM: CHEST  2 VIEW COMPARISON:  PET-CT 03/12/2015. CT chest 01/15/2015. Chest x-ray 10/09/2014.  FINDINGS: Port-A-Cath noted with tip over superior vena cava. Heart size stable. Progressive opacification of the right hemithorax consistent with underlying consolidation and possible pleural effusion. Left mid lung field and left base subsegmental atelectasis. Tiny left pleural effusion cannot be excluded. No pneumothorax. No acute bony abnormality. IMPRESSION: 1. Progressive consolidation of the right hemithorax consistent underlying lung consolidation and possible pleural effusion. 2. Left mid lung field and left base subsegmental atelectasis. Tiny left pleural effusion cannot be excluded. Electronically Signed   By: Marcello Moores  Register   On: 02/14/2017 07:27    EKG: Orders placed or performed during the hospital encounter of 02/14/17  . ED EKG within 10 minutes  . ED EKG within 10 minutes    IMPRESSION AND PLAN:  * Acute on chronic respiratory failure   Bilateral pneumonia    IV antibiotic,  nebulizer.   Cough suppressant medications as needed and Tylenol for fever as needed for   Incentive spirometer.  * Hypertension   We will hold beta blockers currently because of infection and blood pressure running normal.  *  Diabetes   keep on sliding-scale coverage.  * In patient's record atrial fibrillation is not mentioned but as per home medications she is taking amiodarone and digitoxin, currently she is in sinus rhythm and rate is controlled, I would continue this and try to get more details.  * Hypothyroidism   Levothyroxine.  All the records are reviewed and case discussed with ED provider. Management plans discussed with the patient, family and they are in agreement.  CODE STATUS: Full code  Code Status History    Date Active Date Inactive Code Status Order ID Comments User Context   04/21/2015 11:11 04/22/2015 19:14 Full Code [id number]  Fritzi Mandes, MD Inpatient   10/08/2014 18:11 10/12/2014 19:15 Full Code [id number]  Leia Alf, MD Inpatient    Advance Directive Documentation      Most Recent Value  Type of Advance Directive  Healthcare Power of Attorney, Living will  Pre-existing out of facility DNR order (yellow form or pink MOST form)  No data  "MOST" Form in Place?  No data     Discussed with her husband and daughter in the room.  TOTAL TIME TAKING CARE OF THIS PATIENT: 50  minutes.    Vaughan Basta M.D on 02/14/2017   Between 7am to 6pm - Pager - [phone]  After 6pm go to [email or web address] - password [email or web address]  Office  [phone]  CC: Primary care physician; Arnetha Courser, MD   Note: This dictation was prepared with Dragon dictation along with smaller phrase technology. Any transcriptional errors that result from this process are unintentional.

## 2017-02-14 NOTE — ED Triage Notes (Signed)
Per EMS: Patient c/o hemoptysis and SOB beginning last night. Patient coughing up quarter size amounts of blood for EMS in transit approx 5 times.

## 2017-02-14 NOTE — ED Notes (Signed)
Pt consumed about half of 1 boost

## 2017-02-14 NOTE — Progress Notes (Signed)
Hypoglycemic Event  CBG: 40  Treatment: orange juice   Symptoms: None  Follow-up CBG: Time:1727 CBG Result:61  Possible Reasons for Event: Inadequate meal intake  Comments/MD notified:      Katrina Kramer

## 2017-02-14 NOTE — ED Triage Notes (Signed)
Patient normally wears 2L [place]. EMS increased patient's oxygen to  4L [place] for an oxygen saturation of 94%

## 2017-02-14 NOTE — ED Notes (Signed)
Pt alert and sitting in a chair.  Family with pt.  Pt waiting on admission.

## 2017-02-14 NOTE — Progress Notes (Addendum)
ED visit made. Patient is currently followed by hosp[facility] of [facility] [place] at home with a  Diagnosis of Lung Cancer. She is a FULL code. She was brought to the ED today via EMS for evaluation of coughing and spitting up blood. Per chart note review and discussion  with attending physician Dr. Anselm Jungling, plan is for admission with treatment for pneumonia. Patient seen lying on the ED stretcher, alert, husband at bedside. Patient reports feeling "better" requesting boost. They voiced understanding of admission for treatment. Emotional support given. Hospice team updated to admission, will continue to follow and update hospice team. Hospital notes faxed to triage/meidcal records Flo Shanks RN, BSN, [facility] [place], hospital liaison [phone]

## 2017-02-14 NOTE — ED Provider Notes (Signed)
[facility] Emergency Department Provider Note   ____________________________________________   First MD Initiated Contact with Patient 02/14/17 [phone]     (approximate)  I have reviewed the triage vital signs and the nursing notes.   HISTORY  Chief Complaint Hemoptysis and Shortness of Breath    HPI Katrina Kramer is a 77 y.o. female who comes into the hospital today with some shortness of breath.  The patient states that she has had some cough with phlegm and has been coughing up blood.  The patient states that this started last night when she went to bed.  She denies any fevers and denies any sick contacts.  She normally wears 2 L of O2 regularly but EMS had to put her up to 4 as her oxygen saturation when they arrived was 89-90%.  The patient has a history of lung cancer.  She denies any pain nausea or vomiting.  The patient is here today for evaluation.  EMS states that while they were with her the patient coughed up 5 clumps of blood.   Past Medical History:  Diagnosis Date  . Anemia   . Anemia   . Atrial fibrillation ([place])    Dr. Humphrey Rolls, cardiologist  . CKD (chronic kidney disease) stage 4, GFR 15-29 ml/min ([place]) 05/01/2015  . Diabetes mellitus, type II ([place]) 10/05/2016  . Hypertension   . Hypothyroidism   . Lung cancer ([place])   . Lung cancer [facility])     Patient Active Problem List   Diagnosis Date Noted  . Diabetes mellitus, type II ([place]) 10/05/2016  . Hypoxia 09/30/2016  . Cancer of upper lobe of right lung ([place]) 07/27/2015  . CKD (chronic kidney disease) stage 3, GFR 30-59 ml/min ([facility]) 06/25/2015  . Hypokalemia 01/22/2015  . Acid reflux 08/31/2014  . Atrial fibrillation ([place])   . Anemia   . Hypothyroidism   . Lung cancer ([place])   . Hypertension   . IDA (iron deficiency anemia) 07/04/2014  . H/O upper gastrointestinal hemorrhage 03/26/2014  . Iron deficiency anemia due to chronic blood loss 03/26/2014  . CAFL (chronic airflow limitation)  ([place]) 09/24/2013  . Chronic obstructive pulmonary disease ([place]) 09/24/2013  . Hypercholesterolemia 09/24/2013    Past Surgical History:  Procedure Laterality Date  . COLONOSCOPY N/A 10/11/2014   Procedure: COLONOSCOPY;  Surgeon: Manya Silvas, MD;  Location: [facility] ENDOSCOPY;  Service: Endoscopy;  Laterality: N/A;  . ESOPHAGOGASTRODUODENOSCOPY N/A 10/10/2014   Procedure: ESOPHAGOGASTRODUODENOSCOPY (EGD);  Surgeon: Manya Silvas, MD;  Location: [facility] ENDOSCOPY;  Service: Endoscopy;  Laterality: N/A;  . UPPER GASTROINTESTINAL ENDOSCOPY  02/24/14    Prior to Admission medications   Medication Sig Start Date End Date Taking? Authorizing Provider  albuterol (PROVENTIL) (2.5 MG/3ML) 0.083% nebulizer solution USE 1 VIAL VIA NEBULIZER  EVERY 4 HOURS AS NEEDED FOR WHEEZING OR SHORTNESS OF  BREATH. 11/07/16  Yes Cammie Sickle, MD  amiodarone (PACERONE) 200 MG tablet Take 200 mg by mouth daily.   Yes [provider]  aspirin EC 81 MG tablet Take 81 mg daily by mouth.   Yes [provider]  benzonatate (TESSALON) 100 MG capsule Take 2 capsules by mouth three times daily. 01/23/17  Yes Cammie Sickle, MD  Calcium Carb-Cholecalciferol (CALCIUM 600/VITAMIN D3 PO) Take 2 tablets by mouth daily.    Yes [provider]  carvedilol (COREG) 12.5 MG tablet Take 12.5 mg by mouth daily.    [provider]  cephALEXin (KEFLEX) 500 MG capsule Take  1 capsule (500 mg total) by mouth 3 (three) times daily. 01/23/17   Cammie Sickle, MD  digoxin (LANOXIN) 0.125 MG tablet Take 0.125 mg by mouth daily. On Mondays, Wednesdays and Fridays     [provider]  ferrous gluconate (FERATE) 240 (27 FE) MG tablet Take 240 mg by mouth 3 (three) times daily with meals.    [provider]  GNP COUGH DM ER 30 MG/5ML liquid TAKE 1 TEASPOONFUL EVERY 6 HOURS AS NEEDED FOR COUGH 01/30/17   Cammie Sickle, MD  isosorbide mononitrate (IMDUR) 30 MG 24 hr tablet  Take 30 mg by mouth daily.    [provider]  levothyroxine (SYNTHROID, LEVOTHROID) 50 MCG tablet TAKE 1 TABLET BY MOUTH  DAILY FOR 5 DAYS EACH WEEK  AND TAKE ONE-HALF TABLET BY MOUTH DAILY FOR 2 DAYS EACH WEEK 09/21/16   Lada, Satira Anis, MD  linagliptin (TRADJENTA) 5 MG TABS tablet Take 1 tablet (5 mg total) by mouth daily. Patient not taking: Reported on 11/21/2016 10/05/16   Arnetha Courser, MD  loratadine (CLARITIN) 10 MG tablet Take 1 tablet (10 mg total) by mouth daily. 01/23/17   Cammie Sickle, MD  metoprolol tartrate (LOPRESSOR) 50 MG tablet Take 50 mg by mouth 2 (two) times daily.    [provider]  mirtazapine (REMERON) 15 MG tablet Take 0.5 tablets (7.5 mg total) by mouth at bedtime. 01/19/17   Cammie Sickle, MD  MUCINEX 600 MG 12 hr tablet TAKE 1 TABLET BY MOUTH TWICE DAILY 09/08/16   Cammie Sickle, MD  pantoprazole (PROTONIX) 40 MG tablet Take 40 mg by mouth daily.    [provider]  potassium chloride SA (K-DUR,KLOR-CON) 20 MEQ tablet Take 20 mEq by mouth daily.     [provider]  Pseudoephedrine-DM-GG (ROBITUSSIN CF PO) Take 5 mLs by mouth as needed (cough).    [provider]  ROBAFEN AC 100-10 MG/5ML syrup TAKE 1 TEASPOONSFUL BY MOUTH 3 TIMES DAILY AS NEEDED COUGH 01/30/17   Verlon Au, NP  torsemide (DEMADEX) 10 MG tablet Take 10 mg by mouth daily.     [provider]    Allergies Patient has no known allergies.  Family History  Problem Relation Age of Onset  . Stomach cancer Mother   . Cancer Mother        unsure where but states "it spead all over"  . Alzheimer's disease Father   . Varicose Veins Daughter   . Diabetes Neg Hx   . Heart disease Neg Hx   . Hypertension Neg Hx   . Stroke Neg Hx   . COPD Neg Hx     Social History Social History   Tobacco Use  . Smoking status: Former Smoker    Packs/day: 0.75    Years: 30.00    Pack years: 22.50    Types: Cigarettes    Last attempt  to quit: 01/03/2005    Years since quitting: 12.1  . Smokeless tobacco: Never Used  Substance Use Topics  . Alcohol use: No  . Drug use: No    Review of Systems  Constitutional: No fever/chills Eyes: No visual changes. ENT: No sore throat. Cardiovascular: Denies chest pain. Respiratory: cough, shortness of breath, hemoptysis Gastrointestinal: No abdominal pain.  No nausea, no vomiting.  No diarrhea.  No constipation. Genitourinary: Negative for dysuria. Musculoskeletal: Negative for back pain. Skin: Negative for rash. Neurological: Negative for headaches, focal weakness or numbness.   ____________________________________________  PHYSICAL EXAM:  VITAL SIGNS: ED Triage Vitals  Enc Vitals Group     BP 02/14/17 0624 132/69     Pulse Rate 02/14/17 0624 (!) 105     Resp 02/14/17 0624 12     Temp 02/14/17 0624 97.8 F (36.6 C)     Temp Source 02/14/17 0624 Oral     SpO2 02/14/17 0624 94 %     Weight 02/14/17 0619 120 lb (54.4 kg)     Height --      Head Circumference --      Peak Flow --      Pain Score 02/14/17 0619 0     Pain Loc --      Pain Edu? --      Excl. in [place]? --     Constitutional: Alert and oriented. Well appearing and in mild distress. Eyes: Conjunctivae are normal. PERRL. EOMI. Head: Atraumatic. Nose: No congestion/rhinnorhea. Mouth/Throat: Mucous membranes are moist.  Oropharynx non-erythematous. Cardiovascular: Normal rate, regular rhythm. Grossly normal heart sounds.  Good peripheral circulation. Respiratory: Normal respiratory effort.  No retractions.  Coarse expiratory wheezes. Gastrointestinal: Soft and nontender. No distention. Positive bowel sounds Musculoskeletal: No lower extremity tenderness nor edema.   Neurologic:  Normal speech and language.  Skin:  Skin is warm, dry and intact. Psychiatric: Mood and affect are normal.   ____________________________________________   LABS (all labs ordered are listed, but only abnormal results are  displayed)  Labs Reviewed  BASIC METABOLIC PANEL - Abnormal; Notable for the following components:      Result Value   Glucose, Bld 145 (*)    BUN 31 (*)    Creatinine, Ser 1.28 (*)    Calcium 8.5 (*)    GFR calc non Af Amer 40 (*)    GFR calc Af Amer 46 (*)    All other components within normal limits  CBC - Abnormal; Notable for the following components:   RBC 2.86 (*)    Hemoglobin 8.0 (*)    HCT 25.2 (*)    MCHC 31.8 (*)    RDW 18.2 (*)    All other components within normal limits  CULTURE, BLOOD (ROUTINE X 2)  CULTURE, BLOOD (ROUTINE X 2)  TROPONIN I  BLOOD GAS, VENOUS  INFLUENZA PANEL BY PCR (TYPE A & B)   ____________________________________________  EKG  ED ECG REPORT I, Loney Hering, the attending physician, personally viewed and interpreted this ECG.   Date: 02/14/2017  EKG Time: 708  Rate: 99  Rhythm: normal sinus rhythm  Axis: normal  Intervals:none  ST&T Change: none  ____________________________________________  RADIOLOGY  ED MD interpretation:  CXR: Increased infiltrate to RUL and some infiltrate to LLL  Official radiology report(s): Dg Chest 2 View  Result Date: 02/14/2017 CLINICAL DATA:  Hemoptysis.  Shortness of breath. EXAM: CHEST  2 VIEW COMPARISON:  PET-CT 03/12/2015. CT chest 01/15/2015. Chest x-ray 10/09/2014. FINDINGS: Port-A-Cath noted with tip over superior vena cava. Heart size stable. Progressive opacification of the right hemithorax consistent with underlying consolidation and possible pleural effusion. Left mid lung field and left base subsegmental atelectasis. Tiny left pleural effusion cannot be excluded. No pneumothorax. No acute bony abnormality. IMPRESSION: 1. Progressive consolidation of the right hemithorax consistent underlying lung consolidation and possible pleural effusion. 2. Left mid lung field and left base subsegmental atelectasis. Tiny left pleural effusion cannot be excluded. Electronically Signed   By: Marcello Moores   Register   On: 02/14/2017 07:27    ____________________________________________  PROCEDURES  Procedure(s) performed: None  Procedures  Critical Care performed: No  ____________________________________________   INITIAL IMPRESSION / ASSESSMENT AND PLAN / ED COURSE  As part of my medical decision making, I reviewed the following data within the electronic MEDICAL RECORD NUMBER Notes from prior ED visits and [place] Controlled Substance Database   This is a 76 year old female who comes into the hospital today with some shortness of breath and hemoptysis.  My differential diagnosis includes bronchitis, influenza, pneumonia, lung cancer complication.  The patient had some mild tachycardia when she arrived but her oxygen saturation was unremarkable.  The patient is on 4 L.  She had some coarse wheezes so I did give her a DuoNeb treatment.  I sent the patient for some blood work as well as an Publishing rights manager.  The patient's hemoglobin is 8.0 but it is improved from previous.  Her white blood cell count is negative.  The patient's chest x-ray showed some progressive opacification of the right upper lobe as well as some atelectasis in the left lower lobe.  My concern is that the patient may have some pneumonia.  I will order some ceftriaxone and azithromycin.  Since the patient was hypoxic with this hemoptysis she will be admitted to the hospitalist service for further evaluation and treatment of her symptoms.      ____________________________________________   FINAL CLINICAL IMPRESSION(S) / ED DIAGNOSES  Final diagnoses:  Hypoxia  Hemoptysis  Shortness of breath  Community acquired pneumonia, unspecified laterality     ED Discharge Orders    None       Note:  This document was prepared using Dragon voice recognition software and may include unintentional dictation errors.    Loney Hering, MD 02/14/17 [phone]

## 2017-02-15 LAB — EXPECTORATED SPUTUM ASSESSMENT W REFEX TO RESP CULTURE

## 2017-02-15 LAB — BASIC METABOLIC PANEL
ANION GAP: 8 (ref 5–15)
BUN: 26 mg/dL — ABNORMAL HIGH (ref 6–20)
CALCIUM: 8.3 mg/dL — AB (ref 8.9–10.3)
CO2: 24 mmol/L (ref 22–32)
Chloride: 106 mmol/L (ref 101–111)
Creatinine, Ser: 1.08 mg/dL — ABNORMAL HIGH (ref 0.44–1.00)
GFR, EST AFRICAN AMERICAN: 57 mL/min — AB (ref >60)
GFR, EST NON AFRICAN AMERICAN: 49 mL/min — AB (ref >60)
GLUCOSE: 101 mg/dL — AB (ref 65–99)
Potassium: 4 mmol/L (ref 3.5–5.1)
SODIUM: 138 mmol/L (ref 135–145)

## 2017-02-15 LAB — EXPECTORATED SPUTUM ASSESSMENT W GRAM STAIN, RFLX TO RESP C: Special Requests: NORMAL

## 2017-02-15 LAB — BLOOD GAS, VENOUS
ACID-BASE EXCESS: 1.4 mmol/L (ref 0.0–2.0)
BICARBONATE: 26.6 mmol/L (ref 20.0–28.0)
Patient temperature: 37
pCO2, Ven: 44 mmHg (ref 44.0–60.0)
pH, Ven: 7.39 (ref 7.250–7.430)

## 2017-02-15 LAB — CBC
HCT: 22.5 % — ABNORMAL LOW (ref 35.0–47.0)
HEMOGLOBIN: 7.2 g/dL — AB (ref 12.0–16.0)
MCH: 27.8 pg (ref 26.0–34.0)
MCHC: 32 g/dL (ref 32.0–36.0)
MCV: 86.8 fL (ref 80.0–100.0)
Platelets: 200 10*3/uL (ref 150–440)
RBC: 2.6 MIL/uL — ABNORMAL LOW (ref 3.80–5.20)
RDW: 18.4 % — ABNORMAL HIGH (ref 11.5–14.5)
WBC: 4.6 10*3/uL (ref 3.6–11.0)

## 2017-02-15 LAB — GLUCOSE, CAPILLARY
GLUCOSE-CAPILLARY: 95 mg/dL (ref 65–99)
GLUCOSE-CAPILLARY: 126 mg/dL — AB (ref 65–99)
Glucose-Capillary: 102 mg/dL — ABNORMAL HIGH (ref 65–99)

## 2017-02-15 MED ORDER — ENSURE ENLIVE PO LIQD
237.0000 mL | Freq: Two times a day (BID) | ORAL | Status: DC
  Administered 2017-02-15 – 2017-02-16 (×3): 237 mL via ORAL

## 2017-02-15 NOTE — Progress Notes (Signed)
[place] [facility] [place] NAME: Katrina Kramer    MR#:  [id number]  DATE OF BIRTH:  [birth year]  SUBJECTIVE:  CHIEF COMPLAINT:   Chief Complaint  Patient presents with  . Hemoptysis  . Shortness of Breath     Came with SOB, have pneumonia and some hemoptysis.   Feels generalized weak. Her Blood sugar dropped last night.  REVIEW OF SYSTEMS:  CONSTITUTIONAL: No fever, fatigue or weakness.  EYES: No blurred or double vision.  EARS, NOSE, AND THROAT: No tinnitus or ear pain.  RESPIRATORY: have cough, shortness of breath, wheezing and hemoptysis.  CARDIOVASCULAR: No chest pain, orthopnea, edema.  GASTROINTESTINAL: No nausea, vomiting, diarrhea or abdominal pain.  GENITOURINARY: No dysuria, hematuria.  ENDOCRINE: No polyuria, nocturia,  HEMATOLOGY: No anemia, easy bruising or bleeding SKIN: No rash or lesion. MUSCULOSKELETAL: No joint pain or arthritis.   NEUROLOGIC: No tingling, numbness, weakness.  PSYCHIATRY: No anxiety or depression.   ROS  DRUG ALLERGIES:  No Known Allergies  VITALS:  Blood pressure (!) 110/36, pulse (!) 103, temperature 98.6 F (37 C), temperature source Oral, resp. rate 18, height 4\' 11"  (1.499 m), weight 59.7 kg (131 lb 9.8 oz), SpO2 100 %.  PHYSICAL EXAMINATION:  GENERAL:  77 y.o.-year-old patient lying in the bed with no acute distress.  EYES: Pupils equal, round, reactive to light and accommodation. No scleral icterus. Extraocular muscles intact.  HEENT: Head atraumatic, normocephalic. Oropharynx and nasopharynx clear.  NECK:  Supple, no jugular venous distention. No thyroid enlargement, no tenderness.  LUNGS: Normal breath sounds bilaterally, no wheezing, rales,rhonchi or crepitation. No use of accessory muscles of respiration.  CARDIOVASCULAR: S1, S2 normal. No murmurs, rubs, or gallops.  ABDOMEN: Soft, nontender, nondistended. Bowel sounds present. No organomegaly or mass.  EXTREMITIES: No pedal edema, cyanosis,  or clubbing.  NEUROLOGIC: Cranial nerves II through XII are intact. Muscle strength 5/5 in all extremities. Sensation intact. Gait not checked.  PSYCHIATRIC: The patient is alert and oriented x 3.  SKIN: No obvious rash, lesion, or ulcer.   Physical Exam LABORATORY PANEL:   CBC Recent Labs  Lab 02/15/17 0600  WBC 4.6  HGB 7.2*  HCT 22.5*  PLT 200   ------------------------------------------------------------------------------------------------------------------  Chemistries  Recent Labs  Lab 02/15/17 0600  NA 138  K 4.0  CL 106  CO2 24  GLUCOSE 101*  BUN 26*  CREATININE 1.08*  CALCIUM 8.3*   ------------------------------------------------------------------------------------------------------------------  Cardiac Enzymes Recent Labs  Lab 02/14/17 0619  TROPONINI <0.03   ------------------------------------------------------------------------------------------------------------------  RADIOLOGY:  Dg Chest 2 View  Result Date: 02/14/2017 CLINICAL DATA:  Hemoptysis.  Shortness of breath. EXAM: CHEST  2 VIEW COMPARISON:  PET-CT 03/12/2015. CT chest 01/15/2015. Chest x-ray 10/09/2014. FINDINGS: Port-A-Cath noted with tip over superior vena cava. Heart size stable. Progressive opacification of the right hemithorax consistent with underlying consolidation and possible pleural effusion. Left mid lung field and left base subsegmental atelectasis. Tiny left pleural effusion cannot be excluded. No pneumothorax. No acute bony abnormality. IMPRESSION: 1. Progressive consolidation of the right hemithorax consistent underlying lung consolidation and possible pleural effusion. 2. Left mid lung field and left base subsegmental atelectasis. Tiny left pleural effusion cannot be excluded. Electronically Signed   By: Marcello Moores  Register   On: 02/14/2017 07:27    ASSESSMENT AND PLAN:   Principal Problem:   Acute on chronic respiratory failure with hypoxia (HCC) Active Problems:    Community acquired pneumonia   Pneumonia  * Acute on chronic respiratory  failure   Bilateral pneumonia    IV antibiotic,  nebulizer.   Cough suppressant medications as needed and Tylenol for fever as needed for   Incentive spirometer.  * Hypertension   We will hold beta blockers currently because of infection and blood pressure running normal.  * Diabetes   On admission held her linagliptin and only kept on sliding scale coverage, but had Hypoglycemia last night, so d/c sliding scale now.  * In patient's record atrial fibrillation is not mentioned but as per home medications she is taking amiodarone and digitoxin, currently she is in sinus rhythm and rate is controlled, I would continue this and try to get more details.  Her son was in room, don't know much details.  * Hypothyroidism   Levothyroxine.  * Anemia   She follows with cancer center and gets blood transfusion as needed.   Hb is 7.2 today, will call hematology.    She have hemoptysis also, there may be recurrence of lung cancer, may need follow up at cancer center once pneumonia recovers.    All the records are reviewed and case discussed with Care Management/Social Workerr. Management plans discussed with the patient, family and they are in agreement.  CODE STATUS: full.  TOTAL TIME TAKING CARE OF THIS PATIENT: 35 minutes.     POSSIBLE D/C IN 1-2 DAYS, DEPENDING ON CLINICAL CONDITION.   Vaughan Basta M.D on 02/15/2017   Between 7am to 6pm - Pager - [phone]  After 6pm go to [email or web address] - password [email or web address] [place] [facility]  Office  [phone]  CC: Primary care physician; Arnetha Courser, MD  Note: This dictation was prepared with Dragon dictation along with smaller phrase technology. Any transcriptional errors that result from this process are unintentional.

## 2017-02-15 NOTE — Progress Notes (Signed)
Initial Nutrition Assessment  DOCUMENTATION CODES:   Not applicable  INTERVENTION:   Ensure Enlive po BID, each supplement provides 350 kcal and 20 grams of protein  Magic cup TID with meals, each supplement provides 290 kcal and 9 grams of protein  NUTRITION DIAGNOSIS:   Increased nutrient needs related to cancer and cancer related treatments, other (see comment)(PNA) as evidenced by increased estimated needs from protein.  GOAL:   Patient will meet greater than or equal to 90% of their needs  MONITOR:   PO intake, Supplement acceptance, Weight trends, Labs, I & O's  REASON FOR ASSESSMENT:   Malnutrition Screening Tool    ASSESSMENT:   77 y.o. female with a known history of atrial fibrillation, chronic kidney disease, diabetes, hypertension, hypothyroidism, lung cancer- treated in 2013 with chemotherapy and radiation and she is in remission since then admitted for PNA   Met with pt in room today. Pt reports poor appetite and oral intake for the past week but reports her appetite is improving today. Pt reports eating 50% of her breakfast today and 85% of her lunch. Pt reports that she does drink strawberry Ensure at home. Per chart, pt lost 54lbs(32%) from April 2018-November 2018 which is severe weight loss. Pt has regained 9lbs over the past 3 months; she reports that her weight is slowly coming back up. RD will order supplements to help pt meet her estimated needs. RD will also liberalize diet. Pt is followed by hospice at home.   Medications reviewed and include: aspirin, azithromycin, oscal w/ D, ferrous gluconate, heparin, synthroid, remeron, protonix, ceftriaxone  Labs reviewed: K 4.0 wnl, BUN 26(H), creat 1.08(H), Ca 8.3(L) Hgb 7.2(L), Hct 22.5(L)  Nutrition-Focused physical exam completed. Findings are no fat depletion, no muscle depletion, and no edema.   Diet Order:  Diet regular Room service appropriate? Yes; Fluid consistency: Thin  EDUCATION NEEDS:    Education needs have been addressed  Skin:  Reviewed RN Assessment  Last BM:  2/12  Height:   Ht Readings from Last 1 Encounters:  02/14/17 4' 11"  (1.499 m)    Weight:   Wt Readings from Last 1 Encounters:  02/14/17 131 lb 9.8 oz (59.7 kg)    Ideal Body Weight:  44.5 kg  BMI:  Body mass index is 26.58 kg/m.  Estimated Nutritional Needs:   Kcal:  1300-1500kcal/day   Protein:  71-83g/day   Fluid:  >1.3L/day   Koleen Distance MS, RD, LDN Pager #[phone] After Hours Pager: [phone]

## 2017-02-15 NOTE — Progress Notes (Signed)
MD called due to coughing up blood in sputum. Vitals stable Dr.Diamond informed will continue to monitor.

## 2017-02-15 NOTE — Progress Notes (Signed)
Visit made. Patient seen sitting up on the side of the bed, alert, breakfast tray set up. Patient reports again " feeling better". Husband and son at bedside. Patient remains on IV antibiotics and scheduled nebulizer treatments. Will continue to follow and update hospice team as well as provide support tot patient and her family. Flo Shanks RN, BSN, [place] [facility] [place], hospital Liaison [phone]

## 2017-02-15 NOTE — Progress Notes (Signed)
Family Meeting Note  Advance Directive:yes  Today a meeting took place with the Patient and son.  The following clinical team members were present during this meeting:MD  The following were discussed:Patient's diagnosis:lung cancer, pneumonia, Hemoptysis. , Patient's progosis: Unable to determine and Goals for treatment: Full Code  Additional follow-up to be provided: Hematology  Time spent during discussion:20 minutes  Vaughan Basta, MD

## 2017-02-16 ENCOUNTER — Inpatient Hospital Stay

## 2017-02-16 DIAGNOSIS — J189 Pneumonia, unspecified organism: Principal | ICD-10-CM

## 2017-02-16 DIAGNOSIS — R042 Hemoptysis: Secondary | ICD-10-CM

## 2017-02-16 DIAGNOSIS — C7951 Secondary malignant neoplasm of bone: Secondary | ICD-10-CM

## 2017-02-16 DIAGNOSIS — R0602 Shortness of breath: Secondary | ICD-10-CM

## 2017-02-16 DIAGNOSIS — Z87891 Personal history of nicotine dependence: Secondary | ICD-10-CM

## 2017-02-16 DIAGNOSIS — D630 Anemia in neoplastic disease: Secondary | ICD-10-CM

## 2017-02-16 DIAGNOSIS — J9621 Acute and chronic respiratory failure with hypoxia: Secondary | ICD-10-CM

## 2017-02-16 DIAGNOSIS — C3491 Malignant neoplasm of unspecified part of right bronchus or lung: Secondary | ICD-10-CM

## 2017-02-16 LAB — GLUCOSE, CAPILLARY
GLUCOSE-CAPILLARY: 150 mg/dL — AB (ref 65–99)
GLUCOSE-CAPILLARY: 121 mg/dL — AB (ref 65–99)
Glucose-Capillary: 81 mg/dL (ref 65–99)
Glucose-Capillary: 123 mg/dL — ABNORMAL HIGH (ref 65–99)

## 2017-02-16 LAB — HEMOGLOBIN AND HEMATOCRIT, BLOOD
HCT: 24.8 % — ABNORMAL LOW (ref 35.0–47.0)
Hemoglobin: 8 g/dL — ABNORMAL LOW (ref 12.0–16.0)

## 2017-02-16 LAB — CBC
HEMATOCRIT: 20.7 % — AB (ref 35.0–47.0)
HEMOGLOBIN: 6.4 g/dL — AB (ref 12.0–16.0)
MCH: 27.6 pg (ref 26.0–34.0)
MCHC: 31.1 g/dL — ABNORMAL LOW (ref 32.0–36.0)
MCV: 88.9 fL (ref 80.0–100.0)
Platelets: 198 10*3/uL (ref 150–440)
RBC: 2.33 MIL/uL — ABNORMAL LOW (ref 3.80–5.20)
RDW: 18.2 % — AB (ref 11.5–14.5)
WBC: 4.2 10*3/uL (ref 3.6–11.0)

## 2017-02-16 LAB — PREPARE RBC (CROSSMATCH)

## 2017-02-16 MED ORDER — SODIUM CHLORIDE 0.9 % IV SOLN
Freq: Once | INTRAVENOUS | Status: AC
  Administered 2017-02-16: 11:00:00 via INTRAVENOUS

## 2017-02-16 MED ORDER — FUROSEMIDE 10 MG/ML IJ SOLN
20.0000 mg | Freq: Every day | INTRAMUSCULAR | Status: DC
  Administered 2017-02-16: 20 mg via INTRAVENOUS
  Filled 2017-02-16 (×3): qty 4

## 2017-02-16 MED ORDER — IOPAMIDOL (ISOVUE-300) INJECTION 61%
75.0000 mL | Freq: Once | INTRAVENOUS | Status: AC | PRN
  Administered 2017-02-16: 75 mL via INTRAVENOUS

## 2017-02-16 NOTE — Consult Note (Signed)
[place] CONSULT NOTE  Patient Care Team: Lada, Satira Anis, MD as PCP - General (Family Medicine) Manya Silvas, MD (Gastroenterology) Cammie Sickle, MD as Consulting Physician (Oncology) Lavonia Dana, MD as Consulting Physician (Nephrology)  CHIEF COMPLAINTS/PURPOSE OF CONSULTATION: Severe anemia lung cancer/  HISTORY OF PRESENTING ILLNESS:  Katrina Kramer 77 y.o.  female history of metastatic non-small cell lung cancer status post multiple lines of therapy/multiple comorbidities; poor functional status currently in hospice is currently admitted the hospital for worsening shortness of breath/worsening cough.  Patient has been in hospice; last treatment being at least in March 2017 approximately 2 years ago.  Patient has been getting PRBC transfusion every 1-2 months.  Chest x-ray showed increasing pleural effusion/consolidation; and also patient noted to have severe anemia hemoglobin 6.2.   Patient complains of worsening fatigue.  She complains of worsening shortness of breath especially with exertion.  Complains of cough.  No fevers or chills.  ROS: Complains of poor appetite.  No nausea no vomiting.  Positive for constipation.  A complete 10 point review of system is done which is negative except mentioned above in history of present illness  MEDICAL HISTORY:  Past Medical History:  Diagnosis Date  . Anemia   . Anemia   . Atrial fibrillation ([place])    Dr. Humphrey Rolls, cardiologist  . CKD (chronic kidney disease) stage 4, GFR 15-29 ml/min ([place]) 05/01/2015  . Diabetes mellitus, type II ([place]) 10/05/2016  . Hypertension   . Hypothyroidism   . Lung cancer ([place])   . Lung cancer [facility])     SURGICAL HISTORY: Past Surgical History:  Procedure Laterality Date  . COLONOSCOPY N/A 10/11/2014   Procedure: COLONOSCOPY;  Surgeon: Manya Silvas, MD;  Location: [facility] ENDOSCOPY;  Service: Endoscopy;  Laterality: N/A;  . ESOPHAGOGASTRODUODENOSCOPY N/A 10/10/2014    Procedure: ESOPHAGOGASTRODUODENOSCOPY (EGD);  Surgeon: Manya Silvas, MD;  Location: [facility] ENDOSCOPY;  Service: Endoscopy;  Laterality: N/A;  . UPPER GASTROINTESTINAL ENDOSCOPY  02/24/14    SOCIAL HISTORY: Social History   Socioeconomic History  . Marital status: Married    Spouse name: Not on file  . Number of children: Not on file  . Years of education: Not on file  . Highest education level: Not on file  Social Needs  . Financial resource strain: Not on file  . Food insecurity - worry: Not on file  . Food insecurity - inability: Not on file  . Transportation needs - medical: Not on file  . Transportation needs - non-medical: Not on file  Occupational History  . Not on file  Tobacco Use  . Smoking status: Former Smoker    Packs/day: 0.75    Years: 30.00    Pack years: 22.50    Types: Cigarettes    Last attempt to quit: 01/03/2005    Years since quitting: 12.1  . Smokeless tobacco: Never Used  Substance and Sexual Activity  . Alcohol use: No  . Drug use: No  . Sexual activity: Not Currently  Other Topics Concern  . Not on file  Social History Narrative  . Not on file    FAMILY HISTORY: Family History  Problem Relation Age of Onset  . Stomach cancer Mother   . Cancer Mother        unsure where but states "it spead all over"  . Alzheimer's disease Father   . Varicose Veins Daughter   . Diabetes Neg Hx   . Heart disease Neg Hx   .  Hypertension Neg Hx   . Stroke Neg Hx   . COPD Neg Hx     ALLERGIES:  has No Known Allergies.  MEDICATIONS:  Current Facility-Administered Medications  Medication Dose Route Frequency Provider Last Rate Last Dose  . acetaminophen (TYLENOL) tablet 650 mg  650 mg Oral Q6H PRN Vaughan Basta, MD      . amiodarone (PACERONE) tablet 200 mg  200 mg Oral Daily Vaughan Basta, MD   200 mg at 02/16/17 0914  . aspirin EC tablet 81 mg  81 mg Oral Daily Vaughan Basta, MD   81 mg at 02/16/17 0914  . azithromycin  (ZITHROMAX) tablet 500 mg  500 mg Oral Daily Vaughan Basta, MD   500 mg at 02/16/17 0914  . benzonatate (TESSALON) capsule 100 mg  100 mg Oral TID PRN Vaughan Basta, MD      . calcium-vitamin D (OSCAL WITH D) 500-200 MG-UNIT per tablet 1 tablet  1 tablet Oral Q breakfast Vaughan Basta, MD   1 tablet at 02/16/17 0913  . cefTRIAXone (ROCEPHIN) 1 g in sodium chloride 0.9 % 100 mL IVPB  1 g Intravenous Q24H Vaughan Basta, MD   Stopped at 02/16/17 0950  . digoxin (LANOXIN) tablet 0.125 mg  0.125 mg Oral Once per day on Mon Wed Fri Vaughan Basta, MD   0.125 mg at 02/15/17 1030  . docusate sodium (COLACE) capsule 100 mg  100 mg Oral BID PRN Vaughan Basta, MD      . feeding supplement (ENSURE ENLIVE) (ENSURE ENLIVE) liquid 237 mL  237 mL Oral BID BM Vaughan Basta, MD   237 mL at 02/16/17 1500  . ferrous gluconate (FERGON) tablet 324 mg  324 mg Oral TID WC Vaughan Basta, MD   324 mg at 02/16/17 1741  . furosemide (LASIX) injection 20 mg  20 mg Intravenous Daily Vaughan Basta, MD   20 mg at 02/16/17 1359  . ipratropium-albuterol (DUONEB) 0.5-2.5 (3) MG/3ML nebulizer solution 3 mL  3 mL Nebulization Q6H Vaughan Basta, MD   3 mL at 02/16/17 1316  . isosorbide mononitrate (IMDUR) 24 hr tablet 30 mg  30 mg Oral Daily Vaughan Basta, MD   30 mg at 02/16/17 0913  . levothyroxine (SYNTHROID, LEVOTHROID) tablet 25 mcg  25 mcg Oral Once per day on Tue Thu Vachhani, Vaibhavkumar, MD   25 mcg at 02/16/17 0644  . levothyroxine (SYNTHROID, LEVOTHROID) tablet 50 mcg  50 mcg Oral Once per day on Sun Mon Wed Fri Sat Vaughan Basta, MD   50 mcg at 02/15/17 [phone]  . loratadine (CLARITIN) tablet 10 mg  10 mg Oral Daily Vaughan Basta, MD   10 mg at 02/16/17 0914  . mirtazapine (REMERON) tablet 7.5 mg  7.5 mg Oral QHS Vaughan Basta, MD   7.5 mg at 02/15/17 2219  . ondansetron (ZOFRAN) injection 4 mg  4 mg  Intravenous Q6H PRN Vaughan Basta, MD      . pantoprazole (PROTONIX) EC tablet 40 mg  40 mg Oral Daily Vaughan Basta, MD   40 mg at 02/16/17 0913   Facility-Administered Medications Ordered in Other Encounters  Medication Dose Route Frequency Provider Last Rate Last Dose  . 0.9 %  sodium chloride infusion   Intravenous Continuous Leia Alf, MD 20 mL/hr at 07/04/14 1150    . heparin lock flush 100 unit/mL  500 Units Intravenous Once Leia Alf, MD      . heparin lock flush 100 unit/mL  500 Units Intravenous Once Leia Alf, MD      .  sodium chloride 0.9 % injection 10 mL  10 mL Intravenous PRN Leia Alf, MD      . sodium chloride 0.9 % injection 10 mL  10 mL Intravenous PRN Leia Alf, MD   10 mL at 07/04/14 1145  . sodium chloride flush (NS) 0.9 % injection 10 mL  10 mL Intravenous PRN Charlaine Dalton R, MD   10 mL at 09/20/16 0900      .  PHYSICAL EXAMINATION:  Vitals:   02/16/17 1157 02/16/17 1406  BP: (!) 120/43 (!) 108/50  Pulse: (!) 104 (!) 106  Resp: 18 20  Temp: 98.6 F (37 C) 97.7 F (36.5 C)  SpO2: 100% 100%   Filed Weights   02/14/17 0619 02/14/17 1625  Weight: 120 lb (54.4 kg) 131 lb 9.8 oz (59.7 kg)    GENERAL: Cachectic appearing female patient resting in the bed no acute distress.  Alert accompanied by husband.  O2 nasal cannula. EYES: no pallor or icterus OROPHARYNX: no thrush or ulceration. NECK: supple, no masses felt LYMPH:  no palpable lymphadenopathy in the cervical, axillary or inguinal regions LUNGS: decreased breath sounds to auscultation at bases and  No wheeze or crackles HEART/CVS: regular rate & rhythm and no murmurs; No lower extremity edema ABDOMEN: abdomen soft, non-tender and normal bowel sounds Musculoskeletal:no cyanosis of digits and no clubbing  PSYCH: alert & oriented x 3 with fluent speech NEURO: no focal motor/sensory deficits SKIN:  no rashes or significant lesions  LABORATORY DATA:   I have reviewed the data as listed Lab Results  Component Value Date   WBC 4.2 02/16/2017   HGB 8.0 (L) 02/16/2017   HCT 24.8 (L) 02/16/2017   MCV 88.9 02/16/2017   PLT 198 02/16/2017   Recent Labs    09/30/16 1134 11/21/16 0940 01/23/17 0943 02/14/17 0619 02/15/17 0600  NA 133* 130* 131* 135 138  K 4.0 3.5 4.1 4.0 4.0  CL 98 94* 100* 104 106  CO2 26 27 23 24 24   GLUCOSE 180* 234* 218* 145* 101*  BUN 17 35* 44* 31* 26*  CREATININE 1.01* 1.71* 1.79* 1.28* 1.08*  CALCIUM 8.2* 8.4* 8.0* 8.5* 8.3*  GFRNONAA 55* 28* 27* 40* 49*  GFRAA 64 33* 31* 46* 57*  PROT 7.5 8.3* 7.7  --   --   ALBUMIN  --  2.3* 2.3*  --   --   AST 13 19 18   --   --   ALT 5* 8* 8*  --   --   ALKPHOS  --  70 67  --   --   BILITOT 0.7 0.6 0.6  --   --     RADIOGRAPHIC STUDIES: I have personally reviewed the radiological images as listed and agreed with the findings in the report. Dg Chest 2 View  Result Date: 02/14/2017 CLINICAL DATA:  Hemoptysis.  Shortness of breath. EXAM: CHEST  2 VIEW COMPARISON:  PET-CT 03/12/2015. CT chest 01/15/2015. Chest x-ray 10/09/2014. FINDINGS: Port-A-Cath noted with tip over superior vena cava. Heart size stable. Progressive opacification of the right hemithorax consistent with underlying consolidation and possible pleural effusion. Left mid lung field and left base subsegmental atelectasis. Tiny left pleural effusion cannot be excluded. No pneumothorax. No acute bony abnormality. IMPRESSION: 1. Progressive consolidation of the right hemithorax consistent underlying lung consolidation and possible pleural effusion. 2. Left mid lung field and left base subsegmental atelectasis. Tiny left pleural effusion cannot be excluded. Electronically Signed   By: Marcello Moores  Register   On:  02/14/2017 07:27   Ct Chest W Contrast  Result Date: 02/16/2017 CLINICAL DATA:  Fall right lung non-small-cell carcinoma. EXAM: CT CHEST WITH CONTRAST TECHNIQUE: Multidetector CT imaging of the chest was  performed during intravenous contrast administration. CONTRAST:  73mL ISOVUE-300 IOPAMIDOL (ISOVUE-300) INJECTION 61% COMPARISON:  01/15/2015 FINDINGS: Cardiovascular: Stable cardiomegaly. Aortic and coronary artery atherosclerosis. Mediastinum/Nodes: Increased size of central soft tissue mass involving the right hilum and mediastinum. This mass is now causing complete obstruction of the bronchus intermedius and narrowing of the right pulmonary artery. This mass cannot be measured due to the adjacent right lung collapse. Lungs/Pleura: New right lung collapse due to central airway obstruction described above. Right pleural radiodensities are seen likely due to previous pleurodesis common there is no evidence of right-sided pleural effusion. Increased atelectasis seen in the left upper and lower lobes, however no left lung masses are identified. New tiny left pleural effusion. Upper Abdomen: Normal adrenal glands. Tiny calcified gallstone noted. Musculoskeletal:  No suspicious bone lesions. IMPRESSION: Increased size of central soft tissue mass involving right hilum and mediastinum. This mass now causes right lung collapse. There is also increased narrowing of the right pulmonary artery. Increased left lung atelectasis and tiny left pleural effusion. Stable cardiomegaly.  Aortic and coronary artery atherosclerosis. Electronically Signed   By: Earle Gell M.D.   On: 02/16/2017 09:20    ASSESSMENT & PLAN:   #77 year old female patient with multiple comorbidities/metastatic lung cancer with severe anemia  #Metastatic lung cancer non-small cell status post multiple lines of therapy patient has poor performance status/patient is not a candidate for any therapy at this time.  I would recommend evaluation of the CT scan  given her worsening shortness of breath-also to further evaluate progression of disease as would help the family understand trajectory of the disease.  I would recommend continued hospice  care.  #Severe anemia hemoglobin 6-secondary to malignancy-patient is symptomatic from her anemia.  Recommend PRBC transfusion.  #Poor performance status/continue hospice care. Overall prognosis continues to be poor.  Thank you Dr.Vachhani for the referral and allowing me to participate in the care of your pleasant patient.  Discussed with Dr. Anselm Jungling.I also spoke to the patient's husband by the bedside. He agrees with the plan.  All questions were answered. The patient knows to call the clinic with any problems, questions or concerns.   Cammie Sickle, MD 02/16/2017 7:04 PM

## 2017-02-16 NOTE — Progress Notes (Signed)
Blood bank contacted RN, only able to allocate one unit of blood for pt due to pts Hgb. Contacted Dr. Anselm Jungling, verbal order to only transfuse 1 unit PRBCs

## 2017-02-16 NOTE — Progress Notes (Signed)
Visit made. Patient seen sitting up in bed, alert and interactive. Son and husband at bedside. Patient had a chest CT performed this morning, family awaiting the results. She is also to receive blood today for a drop in hemoglobin to 6.4 this morning. Writer spoke with attending physician Dr. Anselm Jungling, oncologist Dr. Rogue Bussing to speak with family today regarding CT results. Hospice team updated. Will continue to follow.  Flo Shanks RN, BSN, [facility] [place], hospital liaison [phone]

## 2017-02-16 NOTE — Progress Notes (Signed)
[name] [place] NAME: Katrina Kramer    MR#:  [id number]  DATE OF BIRTH:  [birth year]  SUBJECTIVE:  CHIEF COMPLAINT:   Chief Complaint  Patient presents with  . Hemoptysis  . Shortness of Breath     Came with SOB, have pneumonia and some hemoptysis.   Feels generalized weak. Her Hb is low today.  REVIEW OF SYSTEMS:  CONSTITUTIONAL: No fever,have fatigue or weakness.  EYES: No blurred or double vision.  EARS, NOSE, AND THROAT: No tinnitus or ear pain.  RESPIRATORY: have cough, shortness of breath, wheezing and hemoptysis.  CARDIOVASCULAR: No chest pain, orthopnea, edema.  GASTROINTESTINAL: No nausea, vomiting, diarrhea or abdominal pain.  GENITOURINARY: No dysuria, hematuria.  ENDOCRINE: No polyuria, nocturia,  HEMATOLOGY: No anemia, easy bruising or bleeding SKIN: No rash or lesion. MUSCULOSKELETAL: No joint pain or arthritis.   NEUROLOGIC: No tingling, numbness, weakness.  PSYCHIATRY: No anxiety or depression.   ROS  DRUG ALLERGIES:  No Known Allergies  VITALS:  Blood pressure (!) 120/43, pulse (!) 104, temperature 98.6 F (37 C), temperature source Oral, resp. rate 18, height 4\' 11"  (1.499 m), weight 59.7 kg (131 lb 9.8 oz), SpO2 100 %.  PHYSICAL EXAMINATION:  GENERAL:  76 y.o.-year-old patient lying in the bed with no acute distress.  EYES: Pupils equal, round, reactive to light and accommodation. No scleral icterus. Extraocular muscles intact.  HEENT: Head atraumatic, normocephalic. Oropharynx and nasopharynx clear.  NECK:  Supple, no jugular venous distention. No thyroid enlargement, no tenderness.  LUNGS: Decreased air entry on right side, no wheezing, rales,rhonchi or crepitation. No use of accessory muscles of respiration.  CARDIOVASCULAR: S1, S2 normal. No murmurs, rubs, or gallops.  ABDOMEN: Soft, nontender, nondistended. Bowel sounds present. No organomegaly or mass.  EXTREMITIES: No pedal edema, cyanosis, or  clubbing.  NEUROLOGIC: Cranial nerves II through XII are intact. Muscle strength 4/5 in all extremities. Sensation intact. Gait not checked.  PSYCHIATRIC: The patient is alert and oriented x 3.  SKIN: No obvious rash, lesion, or ulcer.   Physical Exam LABORATORY PANEL:   CBC Recent Labs  Lab 02/16/17 0522  WBC 4.2  HGB 6.4*  HCT 20.7*  PLT 198   ------------------------------------------------------------------------------------------------------------------  Chemistries  Recent Labs  Lab 02/15/17 0600  NA 138  K 4.0  CL 106  CO2 24  GLUCOSE 101*  BUN 26*  CREATININE 1.08*  CALCIUM 8.3*   ------------------------------------------------------------------------------------------------------------------  Cardiac Enzymes Recent Labs  Lab 02/14/17 0619  TROPONINI <0.03   ------------------------------------------------------------------------------------------------------------------  RADIOLOGY:  Ct Chest W Contrast  Result Date: 02/16/2017 CLINICAL DATA:  Fall right lung non-small-cell carcinoma. EXAM: CT CHEST WITH CONTRAST TECHNIQUE: Multidetector CT imaging of the chest was performed during intravenous contrast administration. CONTRAST:  73mL ISOVUE-300 IOPAMIDOL (ISOVUE-300) INJECTION 61% COMPARISON:  01/15/2015 FINDINGS: Cardiovascular: Stable cardiomegaly. Aortic and coronary artery atherosclerosis. Mediastinum/Nodes: Increased size of central soft tissue mass involving the right hilum and mediastinum. This mass is now causing complete obstruction of the bronchus intermedius and narrowing of the right pulmonary artery. This mass cannot be measured due to the adjacent right lung collapse. Lungs/Pleura: New right lung collapse due to central airway obstruction described above. Right pleural radiodensities are seen likely due to previous pleurodesis common there is no evidence of right-sided pleural effusion. Increased atelectasis seen in the left upper and lower lobes,  however no left lung masses are identified. New tiny left pleural effusion. Upper Abdomen: Normal adrenal glands. Tiny calcified gallstone noted.  Musculoskeletal:  No suspicious bone lesions. IMPRESSION: Increased size of central soft tissue mass involving right hilum and mediastinum. This mass now causes right lung collapse. There is also increased narrowing of the right pulmonary artery. Increased left lung atelectasis and tiny left pleural effusion. Stable cardiomegaly.  Aortic and coronary artery atherosclerosis. Electronically Signed   By: Earle Gell M.D.   On: 02/16/2017 09:20    ASSESSMENT AND PLAN:   Principal Problem:   Acute on chronic respiratory failure with hypoxia (HCC) Active Problems:   Community acquired pneumonia   Pneumonia  * Acute on chronic respiratory failure   Bilateral pneumonia    IV antibiotic,  nebulizer.   Cough suppressant medications as needed and Tylenol for fever as needed for   Incentive spirometer.  * Lung cancer   Patient was treated 2 years ago with chemotherapy and radiation as per family.   On CT scan today it shows worsening of the mass in the right lung with complete collapse due to obstruction of the bronchus.   I have discussed this with patient's family in the room and suggested to meet with pain radiating care to decide the goals of care.  * Hypertension   We will hold beta blockers currently because of infection and blood pressure running normal.  * Diabetes   On admission held her linagliptin and only kept on sliding scale coverage, but had Hypoglycemia last night, so d/c sliding scale .  * In patient's record atrial fibrillation is not mentioned but as per home medications she is taking amiodarone and digitoxin, currently she is in sinus rhythm and rate is controlled, I would continue this and try to get more details.  Her son was in room, don't know much details.  Currently rate is controlled and she is not a candidate for  anticoagulation anyways.  * Hypothyroidism   Levothyroxine.  * Anemia   She follows with cancer center and gets blood transfusion as needed.   Hb is 6.4 today, hematology- suggest blood transfusion.   discussed with pt about possible side effects of blood transfusion- including more common like low grade fever to rare and serious like renal and lung involvement and infections.  She understood- and due to necessity of transfusion- agreed to receive the transfusion.  Hematologist to discuss the prognosis with patient and family again regarding this new finding of worsening lung mass.  All the records are reviewed and case discussed with Care Management/Social Workerr. Management plans discussed with the patient, family and they are in agreement.  CODE STATUS: full.  TOTAL TIME TAKING CARE OF THIS PATIENT: 45 minutes.   Discussed with patient's husband, son, daughter, hematologist. POSSIBLE D/C IN 1-2 DAYS, DEPENDING ON CLINICAL CONDITION.   Vaughan Basta M.D on 02/16/2017   Between 7am to 6pm - Pager - [phone]  After 6pm go to [email or web address] - password [email or web address] [facility] [place] [facility]  Office  [phone]  CC: Primary care physician; Arnetha Courser, MD  Note: This dictation was prepared with Dragon dictation along with smaller phrase technology. Any transcriptional errors that result from this process are unintentional.

## 2017-02-17 LAB — CBC
HEMATOCRIT: 22.8 % — AB (ref 35.0–47.0)
HEMOGLOBIN: 7.4 g/dL — AB (ref 12.0–16.0)
MCH: 27.9 pg (ref 26.0–34.0)
MCHC: 32.4 g/dL (ref 32.0–36.0)
MCV: 86.1 fL (ref 80.0–100.0)
Platelets: 179 10*3/uL (ref 150–440)
RBC: 2.65 MIL/uL — ABNORMAL LOW (ref 3.80–5.20)
RDW: 18.8 % — ABNORMAL HIGH (ref 11.5–14.5)
WBC: 5.5 10*3/uL (ref 3.6–11.0)

## 2017-02-17 LAB — PREPARE RBC (CROSSMATCH)

## 2017-02-17 LAB — GLUCOSE, CAPILLARY
GLUCOSE-CAPILLARY: 110 mg/dL — AB (ref 65–99)
Glucose-Capillary: 88 mg/dL (ref 65–99)
Glucose-Capillary: 136 mg/dL — ABNORMAL HIGH (ref 65–99)
Glucose-Capillary: 84 mg/dL (ref 65–99)

## 2017-02-17 LAB — BASIC METABOLIC PANEL
ANION GAP: 8 (ref 5–15)
BUN: 31 mg/dL — ABNORMAL HIGH (ref 6–20)
CHLORIDE: 107 mmol/L (ref 101–111)
CO2: 24 mmol/L (ref 22–32)
Calcium: 8.3 mg/dL — ABNORMAL LOW (ref 8.9–10.3)
Creatinine, Ser: 1.62 mg/dL — ABNORMAL HIGH (ref 0.44–1.00)
GFR calc non Af Amer: 30 mL/min — ABNORMAL LOW (ref >60)
GFR, EST AFRICAN AMERICAN: 35 mL/min — AB (ref >60)
GLUCOSE: 109 mg/dL — AB (ref 65–99)
Potassium: 4.4 mmol/L (ref 3.5–5.1)
Sodium: 139 mmol/L (ref 135–145)

## 2017-02-17 MED ORDER — SODIUM CHLORIDE 0.9% FLUSH: 3.0000 mL | INTRAVENOUS | Status: DC | PRN

## 2017-02-17 MED ORDER — CEFUROXIME AXETIL 250 MG PO TABS: 250.0000 mg | ORAL_TABLET | Freq: Two times a day (BID) | ORAL | 0 refills | Status: AC

## 2017-02-17 MED ORDER — SODIUM CHLORIDE 0.9 % IV SOLN
250.0000 mL | Freq: Once | INTRAVENOUS | Status: AC
  Administered 2017-02-17: 250 mL via INTRAVENOUS

## 2017-02-17 MED ORDER — HEPARIN SOD (PORK) LOCK FLUSH 100 UNIT/ML IV SOLN
500.0000 [IU] | Freq: Every day | INTRAVENOUS | Status: DC | PRN
  Filled 2017-02-17: qty 5

## 2017-02-17 MED ORDER — DOCUSATE SODIUM 100 MG PO CAPS: 100.0000 mg | ORAL_CAPSULE | Freq: Two times a day (BID) | ORAL | 0 refills | Status: DC | PRN

## 2017-02-17 MED ORDER — SENNA 8.6 MG PO TABS
1.0000 | ORAL_TABLET | Freq: Every day | ORAL | Status: DC
  Administered 2017-02-18: 8.6 mg via ORAL
  Filled 2017-02-17 (×2): qty 1

## 2017-02-17 MED ORDER — HYDROCODONE-ACETAMINOPHEN 5-325 MG PO TABS: 1.0000 | ORAL_TABLET | Freq: Four times a day (QID) | ORAL | 0 refills | Status: AC | PRN

## 2017-02-17 MED ORDER — ENSURE ENLIVE PO LIQD: 237.0000 mL | Freq: Two times a day (BID) | ORAL | 12 refills | Status: AC

## 2017-02-17 MED ORDER — SODIUM CHLORIDE 0.9% FLUSH: 10.0000 mL | INTRAVENOUS | Status: DC | PRN

## 2017-02-17 MED ORDER — HEPARIN SOD (PORK) LOCK FLUSH 100 UNIT/ML IV SOLN
250.0000 [IU] | INTRAVENOUS | Status: DC | PRN
  Filled 2017-02-17: qty 3

## 2017-02-17 MED ORDER — FUROSEMIDE 10 MG/ML IJ SOLN
20.0000 mg | Freq: Once | INTRAMUSCULAR | Status: DC
  Filled 2017-02-17: qty 4

## 2017-02-17 MED ORDER — HYDROCODONE-ACETAMINOPHEN 5-325 MG PO TABS
1.0000 | ORAL_TABLET | Freq: Four times a day (QID) | ORAL | Status: DC | PRN
  Administered 2017-02-17: 1 via ORAL
  Filled 2017-02-17: qty 1

## 2017-02-17 MED ORDER — DOCUSATE SODIUM 100 MG PO CAPS
100.0000 mg | ORAL_CAPSULE | Freq: Two times a day (BID) | ORAL | Status: DC
  Administered 2017-02-17 – 2017-02-18 (×2): 100 mg via ORAL
  Filled 2017-02-17 (×3): qty 1

## 2017-02-17 MED ORDER — FENTANYL 12 MCG/HR TD PT72
12.5000 ug | MEDICATED_PATCH | TRANSDERMAL | Status: DC
  Administered 2017-02-17: 12.5 ug via TRANSDERMAL
  Filled 2017-02-17: qty 1

## 2017-02-17 MED ORDER — ACETAMINOPHEN 325 MG PO TABS: 650.0000 mg | ORAL_TABLET | Freq: Four times a day (QID) | ORAL | 0 refills | Status: AC | PRN

## 2017-02-17 MED ORDER — ACETAMINOPHEN 325 MG PO TABS
650.0000 mg | ORAL_TABLET | Freq: Once | ORAL | Status: AC
  Administered 2017-02-17: 650 mg via ORAL
  Filled 2017-02-17: qty 2

## 2017-02-17 NOTE — Progress Notes (Signed)
[facility] NAME: Katrina Kramer    MR#:  [id number]  DATE OF BIRTH:  06-14-1940  SUBJECTIVE:  CHIEF COMPLAINT:   Chief Complaint  Patient presents with  . Hemoptysis  . Shortness of Breath     Came with SOB, have pneumonia and some hemoptysis.   Feels generalized weak. Received blood transfusion, still Hb around 7 today.   Also have c/o back pain now.  REVIEW OF SYSTEMS:  CONSTITUTIONAL: No fever,have fatigue or weakness.  EYES: No blurred or double vision.  EARS, NOSE, AND THROAT: No tinnitus or ear pain.  RESPIRATORY: have cough, shortness of breath, wheezing and hemoptysis.  CARDIOVASCULAR: No chest pain, orthopnea, edema.  GASTROINTESTINAL: No nausea, vomiting, diarrhea or abdominal pain.  GENITOURINARY: No dysuria, hematuria.  ENDOCRINE: No polyuria, nocturia,  HEMATOLOGY: No anemia, easy bruising or bleeding SKIN: No rash or lesion. MUSCULOSKELETAL: No joint pain or arthritis.  Have back pain. NEUROLOGIC: No tingling, numbness, weakness.  PSYCHIATRY: No anxiety or depression.   ROS  DRUG ALLERGIES:  No Known Allergies  VITALS:  Blood pressure (!) 130/45, pulse (!) 103, temperature 98.1 F (36.7 C), temperature source Oral, resp. rate 16, height 4\' 11"  (1.499 m), weight 59.7 kg (131 lb 9.8 oz), SpO2 100 %.  PHYSICAL EXAMINATION:  GENERAL:  77 y.o.-year-old patient lying in the bed with no acute distress.  EYES: Pupils equal, round, reactive to light and accommodation. No scleral icterus. Extraocular muscles intact.  HEENT: Head atraumatic, normocephalic. Oropharynx and nasopharynx clear.  NECK:  Supple, no jugular venous distention. No thyroid enlargement, no tenderness.  LUNGS: Decreased air entry on right side, no wheezing, rales,rhonchi or crepitation. No use of accessory muscles of respiration.  CARDIOVASCULAR: S1, S2 normal. No murmurs, rubs, or gallops.  ABDOMEN: Soft, nontender, nondistended. Bowel sounds  present. No organomegaly or mass.  EXTREMITIES: No pedal edema, cyanosis, or clubbing.  NEUROLOGIC: Cranial nerves II through XII are intact. Muscle strength 3-4/5 in all extremities. Sensation intact. Gait not checked.  PSYCHIATRIC: The patient is alert and oriented x 3.  SKIN: No obvious rash, lesion, or ulcer.   Physical Exam LABORATORY PANEL:   CBC Recent Labs  Lab 02/17/17 0430  WBC 5.5  HGB 7.4*  HCT 22.8*  PLT 179   ------------------------------------------------------------------------------------------------------------------  Chemistries  Recent Labs  Lab 02/17/17 0430  NA 139  K 4.4  CL 107  CO2 24  GLUCOSE 109*  BUN 31*  CREATININE 1.62*  CALCIUM 8.3*   ------------------------------------------------------------------------------------------------------------------  Cardiac Enzymes Recent Labs  Lab 02/14/17 0619  TROPONINI <0.03   ------------------------------------------------------------------------------------------------------------------  RADIOLOGY:  Ct Chest W Contrast  Result Date: 02/16/2017 CLINICAL DATA:  Fall right lung non-small-cell carcinoma. EXAM: CT CHEST WITH CONTRAST TECHNIQUE: Multidetector CT imaging of the chest was performed during intravenous contrast administration. CONTRAST:  51mL ISOVUE-300 IOPAMIDOL (ISOVUE-300) INJECTION 61% COMPARISON:  01/15/2015 FINDINGS: Cardiovascular: Stable cardiomegaly. Aortic and coronary artery atherosclerosis. Mediastinum/Nodes: Increased size of central soft tissue mass involving the right hilum and mediastinum. This mass is now causing complete obstruction of the bronchus intermedius and narrowing of the right pulmonary artery. This mass cannot be measured due to the adjacent right lung collapse. Lungs/Pleura: New right lung collapse due to central airway obstruction described above. 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 Increased atelectasis seen in the left upper and lower lobes, however no left lung masses are identified. New  tiny left pleural effusion. Upper Abdomen: Normal adrenal glands. Tiny calcified gallstone noted. Musculoskeletal:  No suspicious bone lesions. IMPRESSION: Increased size of central soft tissue mass involving right hilum and mediastinum. This mass now causes right lung collapse. There is also increased narrowing of the right pulmonary artery. Increased left lung atelectasis and tiny left pleural effusion. Stable cardiomegaly.  Aortic and coronary artery atherosclerosis. Electronically Signed   By: Earle Gell M.D.   On: 02/16/2017 09:20    ASSESSMENT AND PLAN:   Principal Problem:   Acute on chronic respiratory failure with hypoxia (HCC) Active Problems:   Community acquired pneumonia   Pneumonia   *Acute on chronic respiratory failure Bilateral pneumonia  IV antibiotic, nebulizer. Cough suppressant medications as needed and Tylenol for fever as needed for Incentive spirometer.  Will give oral ceftin for 3 more days.  * Lung cancer Patient was treated 2 years ago with chemotherapy and radiation as per family. On CT scan it shows worsening of the mass in the right lung with complete collapse due to obstruction of the bronchus. I have discussed this with patient's family in the room and suggested to meet with hospice care to decide the goals of care.   After discussion by me, Hematologist and hospice- family agreed on DNR and cont hospice care at home.  *Hypertension We will hold beta blockers currently because of infection and blood pressure running normal.  *Diabetes On admission held her linagliptin and only kept on sliding scale coverage, but had Hypoglycemia last night, so d/c sliding scale .  *In patient's record atrial fibrillation is not mentioned but as per home medications she is taking amiodarone and digitoxin, currently  she is in sinus rhythm and rate is controlled, I would continue this and try to get more details. Her son was in room, don't know much details. Currently rate is controlled and she is not a candidate for anticoagulation anyways.  *Hypothyroidism Levothyroxine.  * Anemia She follows with cancer center and gets blood transfusion as needed. Hb is 6.4today, hematology- suggest blood transfusion. discussed with pt about possible side effects of blood transfusion- including more common like low grade fever to rare and serious like renal and lung involvement and infections. She understood- and due to necessity of transfusion- agreed to receive the transfusion. One more unit PRBC today ( 02/17/17) due to anemia  * back pain   There may be a metastatis to bone or this could be due to pleural effusion.   As there is no benefit / change in treatment of doing further studies, will not do any more CT on spine, but just focus on pain management by Vicodin. Spoek to hospice, they are working on getting hospital bed for pain control.   Will also add fentanyl patch.  Monitor today in hospital for pain control.  Hematologist and me had discuss the prognosis with patient and family again regarding this new finding of worsening lung mass. Family agreed on DNR and hospcie at home.  All the records are reviewed and case discussed with Care Management/Social Workerr. Management plans discussed with the patient, family and they are in agreement.  CODE STATUS: full.  TOTAL TIME TAKING CARE OF THIS PATIENT: 45 minutes.   Discussed with patient's husband, son, daughter, hematologist. POSSIBLE D/C IN 1-2 DAYS, DEPENDING ON CLINICAL CONDITION.   Vaughan Basta M.D on 02/17/2017   Between 7am to 6pm - Pager - [phone]  After 6pm go to [email or web address] - [place]  [facility]  Office  [phone]  CC: Primary care physician; Arnetha Courser, MD  Note:  This dictation was prepared with Dragon dictation along with smaller phrase technology. Any transcriptional errors that result from this process are unintentional.

## 2017-02-17 NOTE — Progress Notes (Signed)
Visit made. Patient seen sitting up in bed, new complaints of back pain. Husband and cousin at bedside. We talked about the results of the chest CT and the news they received from Dr. Rogue Bussing regarding her disease progression. Per chart review new ordered for Vicodin entered by Dr. Rogue Bussing. Discussed with patient and her husband as well as Diplomatic Services operational officer Danae Chen. She will administer medication as ordered. Writer discussed patient's code status with Mr. Corriher, he had talked previously with Dr. 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 Writer also discussed the possibility of a new need for a hospital bed, at this time he wants to wait until they get home to decide. Possible discharge today depending on pain management per discussion with attending physician Dr. Anselm Jungling. Writer also updated Dr. Anselm Jungling on code status conversation. Signed DNR placed in patient's chart. Will continue to follow and update hospice team. Flo Shanks RN, BSN, [facility], [facility] [phone]

## 2017-02-17 NOTE — Discharge Instructions (Signed)
Hospice care at home.

## 2017-02-17 NOTE — Care Management (Signed)
Discharge orders are in for today.  Santiago Glad with [facility] aware that patient may discharge today.  Per nursing staff patient will tranport by private vehicle.  Signed DNR on chart.

## 2017-02-17 NOTE — Progress Notes (Signed)
Patient is coughing up bloody sputum at times.  Dr Anselm Jungling notified.  This is most likely due to the mass in the patients lung

## 2017-02-17 NOTE — Progress Notes (Signed)
Dr Rogue Bussing had ordered blood.  I informed him that there was still one unit left that could be transfused.  He said to cancel his blood transfusion order.  Lasix had been ordered to be given pre-blood and there is scheduled lasix.  BP 96/45. Dr Anselm Jungling said not to give any lasix.  I also informed Dr Anselm Jungling that the patient did not want to be discharged.  She said her back is hurting too bad.  Dr Anselm Jungling will come and talk to the patient

## 2017-02-17 NOTE — Progress Notes (Signed)
Family Meeting Note  Advance Directive:yes  Today a meeting took place with the Patient, spouse and sister.  The following clinical team members were present during this meeting:MD   The following were discussed:Patient's diagnosis: recurrence of lung cancer, anemia, Lung collapse, Patient's progosis: < 6 months and Goals for treatment: DNR  Additional follow-up to be provided: hospice care at home.  Time spent during discussion:20 minutes  Vaughan Basta, MD

## 2017-02-17 NOTE — Discharge Summary (Addendum)
[place] [facility] [place] NAME: Katrina Kramer    MR#:  [id number]  DATE OF BIRTH:  [birth year]  DATE OF ADMISSION:  02/14/2017 ADMITTING PHYSICIAN: Vaughan Basta, MD  DATE OF DISCHARGE: 02/17/2017  PRIMARY CARE PHYSICIAN: Arnetha Courser, MD    ADMISSION DIAGNOSIS:  Shortness of breath [R06.02] Hypoxia [R09.02] Hemoptysis [R04.2] Community acquired pneumonia, unspecified laterality [J18.9]  DISCHARGE DIAGNOSIS:  Principal Problem:   Acute on chronic respiratory failure with hypoxia ([place]) Active Problems:   Community acquired pneumonia   Pneumonia   SECONDARY DIAGNOSIS:   Past Medical History:  Diagnosis Date  . Anemia   . Anemia   . Atrial fibrillation ([place])    Dr. Humphrey Rolls, cardiologist  . CKD (chronic kidney disease) stage 4, GFR 15-29 ml/min ([place]) 05/01/2015  . Diabetes mellitus, type II ([place]) 10/05/2016  . Hypertension   . Hypothyroidism   . Lung cancer ([place])   . Lung cancer [facility])     HOSPITAL COURSE:   *Acute on chronic respiratory failure Bilateral pneumonia  IV antibiotic, nebulizer. Cough suppressant medications as needed and Tylenol for fever as needed for Incentive spirometer.  Will give oral ceftin for 3 more days.  * Lung cancer   Patient was treated 2 years ago with chemotherapy and radiation as per family.   On CT scan today it shows worsening of the mass in the right lung with complete collapse due to obstruction of the bronchus.   I have discussed this with patient's family in the room and suggested to meet with hospice care to decide the goals of care.   After discussion by me, Hematologist and hospice- family agreed on DNR and cont hospice care at home.  *Hypertension We will hold beta blockers currently because of infection and blood pressure running normal.  *Diabetes  On admission held her linagliptin and only kept on sliding scale coverage, but had Hypoglycemia last  night, so d/c sliding scale .  *In patient's record atrial fibrillation is not mentioned but as per home medications she is taking amiodarone and digitoxin, currently she is in sinus rhythm and rate is controlled, I would continue this and try to get more details.  Her son was in room, don't know much details.  Currently rate is controlled and she is not a candidate for anticoagulation anyways.  *Hypothyroidism Levothyroxine.  * Anemia   She follows with cancer center and gets blood transfusion as needed.   Hb is 6.4 today, hematology- suggest blood transfusion.   discussed with pt about possible side effects of blood transfusion- including more common like low grade fever to rare and serious like renal and lung involvement and infections.  She understood- and due to necessity of transfusion- agreed to receive the transfusion.  * back pain   There may be a metastatis to bone or this could be due to pleural effusion.   As there is no benefit / change in treatment of doing further studies, will not do any more CT on spine, but just focus on pain management by Vicodin.  Hematologist and me had discuss the prognosis with patient and family again regarding this new finding of worsening lung mass. Family agreed on DNR and hospcie at home.   DISCHARGE CONDITIONS:   Stable.  CONSULTS OBTAINED:  Treatment Team:  Cammie Sickle, MD  DRUG ALLERGIES:  No Known Allergies  DISCHARGE MEDICATIONS:   Allergies as of 02/17/2017   No Known Allergies  Medication List    STOP taking these medications   carvedilol 12.5 MG tablet Commonly known as:  COREG   linagliptin 5 MG Tabs tablet Commonly known as:  TRADJENTA   metoprolol tartrate 50 MG tablet Commonly known as:  LOPRESSOR   ROBAFEN AC 100-10 MG/5ML syrup Generic drug:  guaiFENesin-codeine     TAKE these medications   acetaminophen 325 MG tablet Commonly known as:  TYLENOL Take 2 tablets (650 mg total) by  mouth every 6 (six) hours as needed for mild pain or moderate pain.   albuterol (2.5 MG/3ML) 0.083% nebulizer solution Commonly known as:  PROVENTIL USE 1 VIAL VIA NEBULIZER  EVERY 4 HOURS AS NEEDED FOR WHEEZING OR SHORTNESS OF  BREATH.   amiodarone 200 MG tablet Commonly known as:  PACERONE Take 200 mg by mouth daily.   aspirin EC 81 MG tablet Take 81 mg daily by mouth.   benzonatate 100 MG capsule Commonly known as:  TESSALON Take 2 capsules by mouth three times daily.   CALCIUM 600/VITAMIN D3 PO Take 2 tablets by mouth daily.   digoxin 0.125 MG tablet Commonly known as:  LANOXIN Take 0.125 mg by mouth daily. On Mondays, Wednesdays and Fridays   docusate sodium 100 MG capsule Commonly known as:  COLACE Take 1 capsule (100 mg total) by mouth 2 (two) times daily as needed for mild constipation.   feeding supplement (ENSURE ENLIVE) Liqd Take 237 mLs by mouth 2 (two) times daily between meals.   FERATE 240 (27 FE) MG tablet Generic drug:  ferrous gluconate Take 240 mg by mouth 3 (three) times daily with meals.   GNP COUGH DM ER 30 MG/5ML liquid Generic drug:  dextromethorphan TAKE 1 TEASPOONFUL EVERY 6 HOURS AS NEEDED FOR COUGH   HYDROcodone-acetaminophen 5-325 MG tablet Commonly known as:  NORCO/VICODIN Take 1 tablet by mouth every 6 (six) hours as needed for severe pain.   isosorbide mononitrate 30 MG 24 hr tablet Commonly known as:  IMDUR Take 30 mg by mouth daily.   levothyroxine 50 MCG tablet Commonly known as:  SYNTHROID, LEVOTHROID TAKE 1 TABLET BY MOUTH  DAILY FOR 5 DAYS EACH WEEK  AND TAKE ONE-HALF TABLET BY MOUTH DAILY FOR 2 DAYS EACH WEEK What changed:    how much to take  how to take this  when to take this  additional instructions   loratadine 10 MG tablet Commonly known as:  CLARITIN Take 1 tablet (10 mg total) by mouth daily.   mirtazapine 15 MG tablet Commonly known as:  REMERON Take 0.5 tablets (7.5 mg total) by mouth at bedtime.    MUCINEX 600 MG 12 hr tablet Generic drug:  guaiFENesin TAKE 1 TABLET BY MOUTH TWICE DAILY   pantoprazole 40 MG tablet Commonly known as:  PROTONIX Take 40 mg by mouth daily.   potassium chloride SA 20 MEQ tablet Commonly known as:  K-DUR,KLOR-CON Take 20 mEq by mouth daily.   ROBITUSSIN CF PO Take 5 mLs by mouth as needed (cough).   torsemide 10 MG tablet Commonly known as:  DEMADEX Take 10 mg by mouth daily.        DISCHARGE INSTRUCTIONS:    Follow with Hospice at home.  If you experience worsening of your admission symptoms, develop shortness of breath, life threatening emergency, suicidal or homicidal thoughts you must seek medical attention immediately by calling 911 or calling your MD immediately  if symptoms less severe.  You Must read complete instructions/literature along with all the possible  adverse reactions/side effects for all the Medicines you take and that have been prescribed to you. Take any new Medicines after you have completely understood and accept all the possible adverse reactions/side effects.   Please note  You were cared for by a hospitalist during your hospital stay. If you have any questions about your discharge medications or the care you received while you were in the hospital after you are discharged, you can call the unit and asked to speak with the hospitalist on call if the hospitalist that took care of you is not available. Once you are discharged, your primary care physician will handle any further medical issues. Please note that NO REFILLS for any discharge medications will be authorized once you are discharged, as it is imperative that you return to your primary care physician (or establish a relationship with a primary care physician if you do not have one) for your aftercare needs so that they can reassess your need for medications and monitor your lab values.    Today   CHIEF COMPLAINT:   Chief Complaint  Patient presents with  .  Hemoptysis  . Shortness of Breath    HISTORY OF PRESENT ILLNESS:  Katrina Kramer  is a 77 y.o. female with a known history of atrial fibrillation, chronic kidney disease, diabetes, hypertension, hypothyroidism, lung cancer- treated in 2013 with chemotherapy and radiation and she is in remission since then. For last 3-4 days she has increasing cough with some clear sputum production initially but it is worsening now with some blood streaks and sticky sputum now. She also feels increasingly short of breath and so husband has to increase her oxygen at home from 2 L to 2 and half to 3 L. She also normally does not require any support and walks independently in house but last 2-3 days feels some short of breath and weak on walking around. In ER she is noted to have pneumonia and because of her history of lung cancer and feeling short of breath and requiring higher oxygen ER physician suggested to admit to hospitalist team.   VITAL SIGNS:  Blood pressure (!) 130/40, pulse (!) 101, temperature 97.7 F (36.5 C), temperature source Oral, resp. rate 20, height 4\' 11"  (1.499 m), weight 59.7 kg (131 lb 9.8 oz), SpO2 100 %.  I/O:    Intake/Output Summary (Last 24 hours) at 02/17/2017 1047 Last data filed at 02/17/2017 1011 Gross per 24 hour  Intake 533.66 ml  Output 1100 ml  Net -566.34 ml    PHYSICAL EXAMINATION:   GENERAL:  77 y.o.-year-old patient lying in the bed with no acute distress.  EYES: Pupils equal, round, reactive to light and accommodation. No scleral icterus. Extraocular muscles intact.  HEENT: Head atraumatic, normocephalic. Oropharynx and nasopharynx clear.  NECK:  Supple, no jugular venous distention. No thyroid enlargement, no tenderness.  LUNGS: Decreased air entry on right side, no wheezing, rales,rhonchi or crepitation. No use of accessory muscles of respiration.  CARDIOVASCULAR: S1, S2 normal. No murmurs, rubs, or gallops.  ABDOMEN: Soft, nontender, nondistended. Bowel sounds  present. No organomegaly or mass.  EXTREMITIES: No pedal edema, cyanosis, or clubbing.  NEUROLOGIC: Cranial nerves II through XII are intact. Muscle strength 3-4/5 in all extremities. Sensation intact. Gait not checked.  PSYCHIATRIC: The patient is alert and oriented x 3.  SKIN: No obvious rash, lesion, or ulcer.     DATA REVIEW:   CBC Recent Labs  Lab 02/17/17 0430  WBC 5.5  HGB 7.4*  HCT  22.8*  PLT 179    Chemistries  Recent Labs  Lab 02/17/17 0430  NA 139  K 4.4  CL 107  CO2 24  GLUCOSE 109*  BUN 31*  CREATININE 1.62*  CALCIUM 8.3*    Cardiac Enzymes Recent Labs  Lab 02/14/17 0619  TROPONINI <0.03    Microbiology Results  Results for orders placed or performed during the hospital encounter of 02/14/17  Culture, expectorated sputum-assessment     Status: None   Collection Time: 02/14/17  6:15 AM  Result Value Ref Range Status   Specimen Description SPUTUM  Final   Special Requests Normal  Final   Sputum evaluation   Final    Sputum specimen not acceptable for testing.  Please recollect.   REQUEST FOR RECOLLECT CALLED TO JEANETTE RILEY AT 6712 ON 02/15/17 [place]. Performed at [facility], [address] [place], [place] [address]    Report Status 02/15/2017 FINAL  Final  Blood culture (routine x 2)     Status: None (Preliminary result)   Collection Time: 02/14/17  9:22 AM  Result Value Ref Range Status   Specimen Description BLOOD LEFT ANTECUBITAL  Final   Special Requests   Final    BOTTLES DRAWN AEROBIC AND ANAEROBIC Blood Culture adequate volume   Culture   Final    NO GROWTH 3 DAYS Performed at [facility], [address] [place], [place] [address]    Report Status PENDING  Incomplete  Blood culture (routine x 2)     Status: None (Preliminary result)   Collection Time: 02/14/17  9:22 AM  Result Value Ref Range Status   Specimen Description BLOOD BLOOD RIGHT WRIST  Final   Special Requests   Final    BOTTLES DRAWN AEROBIC  AND ANAEROBIC Blood Culture adequate volume   Culture   Final    NO GROWTH 3 DAYS Performed at [facility], [address] [place],  [address]    Report Status PENDING  Incomplete    RADIOLOGY:  Ct Chest W Contrast  Result Date: 02/16/2017 CLINICAL DATA:  Fall right lung non-small-cell carcinoma. EXAM: CT CHEST WITH CONTRAST TECHNIQUE: Multidetector CT imaging of the chest was performed during intravenous contrast administration. CONTRAST:  40mL ISOVUE-300 IOPAMIDOL (ISOVUE-300) INJECTION 61% COMPARISON:  01/15/2015 FINDINGS: Cardiovascular: Stable cardiomegaly. Aortic and coronary artery atherosclerosis. Mediastinum/Nodes: Increased size of central soft tissue mass involving the right hilum and mediastinum. This mass is now causing complete obstruction of the bronchus intermedius and narrowing of the right pulmonary artery. This mass cannot be measured due to the adjacent right lung collapse. Lungs/Pleura: New right lung collapse due to central airway obstruction described above. Right pleural radiodensities are seen likely due to previous pleurodesis common there is no evidence of right-sided pleural effusion. Increased atelectasis seen in the left upper and lower lobes, however no left lung masses are identified. New tiny left pleural effusion. Upper Abdomen: Normal adrenal glands. Tiny calcified gallstone noted. Musculoskeletal:  No suspicious bone lesions. IMPRESSION: Increased size of central soft tissue mass involving right hilum and mediastinum. This mass now causes right lung collapse. There is also increased narrowing of the right pulmonary artery. Increased left lung atelectasis and tiny left pleural effusion. Stable cardiomegaly.  Aortic and coronary artery atherosclerosis. Electronically Signed   By: Earle Gell M.D.   On: 02/16/2017 09:20    EKG:   Orders placed or performed during the hospital encounter of 02/14/17  . ED EKG within 10 minutes  .  ED EKG within 10  minutes      Management plans discussed with the patient, family and they are in agreement.  CODE STATUS:     Code Status Orders  (From admission, onward)        Start     Ordered   02/14/17 1627  Full code  Continuous     02/14/17 1626    Code Status History    Date Active Date Inactive Code Status Order ID Comments User Context   04/21/2015 11:11 04/22/2015 19:14 Full Code [id number]  Fritzi Mandes, MD Inpatient   10/08/2014 18:11 10/12/2014 19:15 Full Code [id number]  Leia Alf, MD Inpatient    Advance Directive Documentation     Most Recent Value  Type of Advance Directive  Living will  Pre-existing out of facility DNR order (yellow form or pink MOST form)  No data  "MOST" Form in Place?  No data      TOTAL TIME TAKING CARE OF THIS PATIENT: 45 minutes.    Vaughan Basta M.D on 02/17/2017 at 10:47 AM  Between 7am to 6pm - Pager - [phone]  After 6pm go to [email or web address] - password [email or web address] [facility]  Office  [phone]  CC: Primary care physician; Arnetha Courser, MD   Note: This dictation was prepared with Dragon dictation along with smaller phrase technology. Any transcriptional errors that result from this process are unintentional.

## 2017-02-17 NOTE — Progress Notes (Signed)
Writer spoke with patient's husband regarding a hospital bed, hospital bed ordered for delivery tomorrow morning per his request. Hospital care team updated. Plan is for discharge home tomorrow via EMS.  Flo Shanks RN, BSN, [place] [facility] [place], hospital Liaison [phone]

## 2017-02-17 NOTE — Progress Notes (Signed)
Katrina Kramer   DOB:19-Feb-1940   PN#:[id number]    Subjective: Shortness of breath cough  Patient continues to feel poorly.  She complains of worsening shortness of breath with exertion.  Also complains of cough with intermittent streaks of blood.  She also complains of back pain.  Poor appetite.  Positive for weight loss.  Review of system: Denies any new headaches.  Denies any vision changes or double vision.  Positive for nausea no vomiting.  Complete 10 point review system is done which is negative except mentioned above.  Objective:  Vitals:   02/17/17 1115 02/17/17 1150  BP: (!) 96/45 (!) 130/45  Pulse: (!) 102 (!) 103  Resp: 14 16  Temp: 97.8 F (36.6 C) 98.1 F (36.7 C)  SpO2: 100% 100%     Intake/Output Summary (Last 24 hours) at 02/17/2017 1401 Last data filed at 02/17/2017 1133 Gross per 24 hour  Intake 340 ml  Output 1400 ml  Net -1060 ml    GENERAL: Cachectic appearing female patient resting in the bed no acute distress.  Alert accompanied by husband.  O2 nasal cannula. EYES: no pallor or icterus OROPHARYNX: no thrush or ulceration. NECK: supple, no masses felt LYMPH:  no palpable lymphadenopathy in the cervical, axillary or inguinal regions LUNGS: decreased breath sounds to auscultation at bases and  No wheeze or crackles HEART/CVS: regular rate & rhythm and no murmurs; No lower extremity edema ABDOMEN: abdomen soft, non-tender and normal bowel sounds Musculoskeletal:no cyanosis of digits and no clubbing  PSYCH: alert & oriented x 3 with fluent speech NEURO: no focal motor/sensory deficits SKIN:  no rashes or significant lesions     Labs:  Lab Results  Component Value Date   WBC 5.5 02/17/2017   HGB 7.4 (L) 02/17/2017   HCT 22.8 (L) 02/17/2017   MCV 86.1 02/17/2017   PLT 179 02/17/2017   NEUTROABS 6.0 01/23/2017    Lab Results  Component Value Date   NA 139 02/17/2017   K 4.4 02/17/2017   CL 107 02/17/2017   CO2 24 02/17/2017    Studies:  Ct  Chest W Contrast  Result Date: 02/16/2017 CLINICAL DATA:  Fall right lung non-small-cell carcinoma. EXAM: CT CHEST WITH CONTRAST TECHNIQUE: Multidetector CT imaging of the chest was performed during intravenous contrast administration. CONTRAST:  76mL ISOVUE-300 IOPAMIDOL (ISOVUE-300) INJECTION 61% COMPARISON:  01/15/2015 FINDINGS: Cardiovascular: Stable cardiomegaly. Aortic and coronary artery atherosclerosis. Mediastinum/Nodes: Increased size of central soft tissue mass involving the right hilum and mediastinum. This mass is now causing complete obstruction of the bronchus intermedius and narrowing of the right pulmonary artery. This mass cannot be measured due to the adjacent right lung collapse. Lungs/Pleura: New right lung collapse due to central airway obstruction described above. Right pleural radiodensities are seen likely due to previous pleurodesis common there is no evidence of right-sided pleural effusion. Increased atelectasis seen in the left upper and lower lobes, however no left lung masses are identified. New tiny left pleural effusion. Upper Abdomen: Normal adrenal glands. Tiny calcified gallstone noted. Musculoskeletal:  No suspicious bone lesions. IMPRESSION: Increased size of central soft tissue mass involving right hilum and mediastinum. This mass now causes right lung collapse. There is also increased narrowing of the right pulmonary artery. Increased left lung atelectasis and tiny left pleural effusion. Stable cardiomegaly.  Aortic and coronary artery atherosclerosis. Electronically Signed   By: Earle Gell M.D.   On: 02/16/2017 09:20    Assessment & Plan:   #77 year old female patient with  multiple comorbidities/metastatic lung cancer with severe anemia  # Metastatic lung cancer non-small cell status post multiple lines of therapy patient has poor performance status. CT scan unfortunately shows increasing right-sided/hilar tumor with complete collapse of the right lung.  Some  atelectasis on the contralateral lung.  This is most likely from progressive malignancy. /patient is not a candidate for any therapy at this time.   #Severe anemia hemoglobin 7.2-secondary to malignancy-status post 1 unit proceed with second unit today.  #Prognosis/disposition-had a long discussion with the patient/patient's husband by the bedside regarding the poor prognosis/especially given the worsening right-sided malignancy/lung collapse.  Reviewed the images myself and with the patient's husband at length.  Discussed life expectancy would likely be measured in weeks/few months rather than years at this time [surprisingly patient had been in hospice for the last 2 years/without any treatment].  I offered to talk to his children; he states that he would be willing to talk with them himself.  I would recommend discharging home with hospice care as prior.  #CODE STATUS/previously hospice.  Discussed DNR/DNI with the patient's husband-he agrees with DNR/DNI; he also feels his wife would agree with the same.  However he would not want me to talk to her as she is quite upset at this time.   # Discussed with Dr. Anselm Jungling.spoke to the patient's nurse.  I also spoke to the patient's husband by the bedside. He agrees with the plan.   # 40 minutes face-to-face with the patient discussing the above plan of care; more than 50% of time spent on prognosis/ natural history; counseling and coordination.   Cammie Sickle, MD 02/17/2017  2:01 PM

## 2017-02-18 LAB — BPAM RBC
BLOOD PRODUCT EXPIRATION DATE: 201902202359
Blood Product Expiration Date: 201903072359
ISSUE DATE / TIME: 201902141126
ISSUE DATE / TIME: 201902151120
UNIT TYPE AND RH: 600
Unit Type and Rh: 6200

## 2017-02-18 LAB — GLUCOSE, CAPILLARY
Glucose-Capillary: 78 mg/dL (ref 65–99)
Glucose-Capillary: 142 mg/dL — ABNORMAL HIGH (ref 65–99)

## 2017-02-18 LAB — TYPE AND SCREEN
ABO/RH(D): AB POS
ANTIBODY SCREEN: NEGATIVE
UNIT DIVISION: 0
Unit division: 0

## 2017-02-18 MED ORDER — FENTANYL 12 MCG/HR TD PT72: 12.5000 ug | MEDICATED_PATCH | TRANSDERMAL | 0 refills | Status: DC

## 2017-02-18 MED ORDER — SENNA 8.6 MG PO TABS: 1.0000 | ORAL_TABLET | Freq: Every day | ORAL | 0 refills | Status: AC

## 2017-02-18 MED ORDER — DOCUSATE SODIUM 100 MG PO CAPS: 100.0000 mg | ORAL_CAPSULE | Freq: Two times a day (BID) | ORAL | 0 refills | Status: AC | PRN

## 2017-02-18 NOTE — Care Management Note (Signed)
Case Management Note  Patient Details  Name: Katrina Kramer MRN: [id number] Date of Birth: 21-Feb-1940  Subjective/Objective:    Orders for hospice to follow [name] at home following this hospital discharge, and note by Flo Shanks, hospice Liaison, that a hospital bed would be delivered to [name] home today, were faxed to [facility] at [facility]. Follow-up e-mail also sent to [place] at [facility] of [facility]C.                  Action/Plan:   Expected Discharge Date:  02/18/17               Expected Discharge Plan:  Home w Hospice Care  In-House Referral:     Discharge planning Services  CM Consult  Post Acute Care Choice:  Resumption of Svcs/PTA Provider Choice offered to:     DME Arranged:    DME Agency:     HH Arranged:    [place] Agency:  [facility]  Status of Service:  Completed, signed off  If discussed at [place] of Stay Meetings, dates discussed:    Additional Comments:  Seona Clemenson A, RN 02/18/2017, 12:36 PM

## 2017-02-18 NOTE — Progress Notes (Signed)
Patient ready to be discharged. IV removed. Bathed, belongings gathered. Prescriptions given and discharge instructions explained. Switched pt over to portable oxygen. Wheeled to the car by NT.

## 2017-02-18 NOTE — Discharge Summary (Signed)
[facility] NAME: Katrina Kramer    MR#:  [id number]  DATE OF BIRTH:  17-Sep-1940  DATE OF ADMISSION:  02/14/2017 ADMITTING PHYSICIAN: Vaughan Basta, MD  DATE OF DISCHARGE: 02/18/2017  PRIMARY CARE PHYSICIAN: Arnetha Courser, MD    ADMISSION DIAGNOSIS:  Shortness of breath [R06.02] Hypoxia [R09.02] Hemoptysis [R04.2] Community acquired pneumonia, unspecified laterality [J18.9]  DISCHARGE DIAGNOSIS:  Principal Problem:   Acute on chronic respiratory failure with hypoxia ([place]) Active Problems:   Community acquired pneumonia   Pneumonia  SECONDARY DIAGNOSIS:   Past Medical History:  Diagnosis Date  . Anemia   . Anemia   . Atrial fibrillation ([place])    Dr. Humphrey Rolls, cardiologist  . CKD (chronic kidney disease) stage 4, GFR 15-29 ml/min ([place]) 05/01/2015  . Diabetes mellitus, type II ([place]) 10/05/2016  . Hypertension   . Hypothyroidism   . Lung cancer ([place])   . Lung cancer [facility])     HOSPITAL COURSE:   *Acute on chronic respiratory failure Bilateral pneumonia  IV antibiotic, nebulizer. Cough suppressant medications as needed and Tylenol for fever as needed for Incentive spirometer.  Will give oral ceftin for 3 more days.  * Lung cancer   Patient was treated 2 years ago with chemotherapy and radiation as per family.   On CT scan today it shows worsening of the mass in the right lung with complete collapse due to obstruction of the bronchus.   I have discussed this with patient's family in the room and suggested to meet with hospice care to decide the goals of care.   After discussion by me, Hematologist and hospice- family agreed on DNR and cont hospice care at home.  *Hypertension We will hold beta blockers currently because of infection and blood pressure running normal.  *Diabetes  On admission held her linagliptin and only kept on sliding scale coverage, but had Hypoglycemia last night,  so d/c sliding scale .  *In patient's record atrial fibrillation is not mentioned but as per home medications she is taking amiodarone and digitoxin, currently she is in sinus rhythm and rate is controlled, I would continue this and try to get more details.  Her son was in room, don't know much details.  Currently rate is controlled and she is not a candidate for anticoagulation anyways.  *Hypothyroidism Levothyroxine.  * Anemia   She follows with cancer center and gets blood transfusion as needed.   Hb is 6.4 today, hematology- suggest blood transfusion.   discussed with pt about possible side effects of blood transfusion- including more common like low grade fever to rare and serious like renal and lung involvement and infections.  She understood- and due to necessity of transfusion- agreed to receive the transfusion.   Hb stable, Will stop her aspirin on discharge.  * back pain   There may be a metastatis to bone or this could be due to pleural effusion.   As there is no benefit / change in treatment of doing further studies, will not do any more CT on spine, but just focus on pain management by Vicodin.   Fentanyl patch helped to have good sleep last night.   Hematologist and me had discuss the prognosis with patient and family again regarding this new finding of worsening lung mass. Family agreed on DNR and hospcie at home.   DISCHARGE CONDITIONS:   Stable.  CONSULTS OBTAINED:  Treatment Team:  Cammie Sickle, MD  DRUG ALLERGIES:  No Known Allergies  DISCHARGE MEDICATIONS:   Allergies as of 02/18/2017   No Known Allergies     Medication List    STOP taking these medications   aspirin EC 81 MG tablet   carvedilol 12.5 MG tablet Commonly known as:  COREG   linagliptin 5 MG Tabs tablet Commonly known as:  TRADJENTA   metoprolol tartrate 50 MG tablet Commonly known as:  LOPRESSOR   ROBAFEN AC 100-10 MG/5ML syrup Generic drug:   guaiFENesin-codeine     TAKE these medications   acetaminophen 325 MG tablet Commonly known as:  TYLENOL Take 2 tablets (650 mg total) by mouth every 6 (six) hours as needed for mild pain or moderate pain.   albuterol (2.5 MG/3ML) 0.083% nebulizer solution Commonly known as:  PROVENTIL USE 1 VIAL VIA NEBULIZER  EVERY 4 HOURS AS NEEDED FOR WHEEZING OR SHORTNESS OF  BREATH.   amiodarone 200 MG tablet Commonly known as:  PACERONE Take 200 mg by mouth daily.   benzonatate 100 MG capsule Commonly known as:  TESSALON Take 2 capsules by mouth three times daily.   CALCIUM 600/VITAMIN D3 PO Take 2 tablets by mouth daily.   cefUROXime 250 MG tablet Commonly known as:  CEFTIN Take 1 tablet (250 mg total) by mouth 2 (two) times daily for 3 days.   digoxin 0.125 MG tablet Commonly known as:  LANOXIN Take 0.125 mg by mouth daily. On Mondays, Wednesdays and Fridays   docusate sodium 100 MG capsule Commonly known as:  COLACE Take 1 capsule (100 mg total) by mouth 2 (two) times daily as needed for mild constipation.   feeding supplement (ENSURE ENLIVE) Liqd Take 237 mLs by mouth 2 (two) times daily between meals.   fentaNYL 12 MCG/HR Commonly known as:  DURAGESIC - dosed mcg/hr Place 1 patch (12.5 mcg total) onto the skin every 3 (three) days. Start taking on:  02/20/2017   FERATE 240 (27 FE) MG tablet Generic drug:  ferrous gluconate Take 240 mg by mouth 3 (three) times daily with meals.   GNP COUGH DM ER 30 MG/5ML liquid Generic drug:  dextromethorphan TAKE 1 TEASPOONFUL EVERY 6 HOURS AS NEEDED FOR COUGH   HYDROcodone-acetaminophen 5-325 MG tablet Commonly known as:  NORCO/VICODIN Take 1 tablet by mouth every 6 (six) hours as needed for severe pain.   isosorbide mononitrate 30 MG 24 hr tablet Commonly known as:  IMDUR Take 30 mg by mouth daily.   levothyroxine 50 MCG tablet Commonly known as:  SYNTHROID, LEVOTHROID TAKE 1 TABLET BY MOUTH  DAILY FOR 5 DAYS EACH WEEK  AND  TAKE ONE-HALF TABLET BY MOUTH DAILY FOR 2 DAYS EACH WEEK What changed:    how much to take  how to take this  when to take this  additional instructions   loratadine 10 MG tablet Commonly known as:  CLARITIN Take 1 tablet (10 mg total) by mouth daily.   mirtazapine 15 MG tablet Commonly known as:  REMERON Take 0.5 tablets (7.5 mg total) by mouth at bedtime.   MUCINEX 600 MG 12 hr tablet Generic drug:  guaiFENesin TAKE 1 TABLET BY MOUTH TWICE DAILY   pantoprazole 40 MG tablet Commonly known as:  PROTONIX Take 40 mg by mouth daily.   potassium chloride SA 20 MEQ tablet Commonly known as:  K-DUR,KLOR-CON Take 20 mEq by mouth daily.   ROBITUSSIN CF PO Take 5 mLs by mouth as needed (cough).   senna 8.6 MG Tabs tablet Commonly known as:  [place] [employer]  Take 1 tablet (8.6 mg total) by mouth daily. Start taking on:  02/19/2017   torsemide 10 MG tablet Commonly known as:  DEMADEX Take 10 mg by mouth daily.        DISCHARGE INSTRUCTIONS:    Follow with Hospice at home.  If you experience worsening of your admission symptoms, develop shortness of breath, life threatening emergency, suicidal or homicidal thoughts you must seek medical attention immediately by calling 911 or calling your MD immediately  if symptoms less severe.  You Must read complete instructions/literature along with all the possible adverse reactions/side effects for all the Medicines you take and that have been prescribed to you. Take any new Medicines after you have completely understood and accept all the possible adverse reactions/side effects.   Please note  You were cared for by a hospitalist during your hospital stay. If you have any questions about your discharge medications or the care you received while you were in the hospital after you are discharged, you can call the unit and asked to speak with the hospitalist on call if the hospitalist that took care of you is not available. Once you are  discharged, your primary care physician will handle any further medical issues. Please note that NO REFILLS for any discharge medications will be authorized once you are discharged, as it is imperative that you return to your primary care physician (or establish a relationship with a primary care physician if you do not have one) for your aftercare needs so that they can reassess your need for medications and monitor your lab values.    Today   CHIEF COMPLAINT:   Chief Complaint  Patient presents with  . Hemoptysis  . Shortness of Breath    HISTORY OF PRESENT ILLNESS:  Katrina Kramer  is a 77 y.o. female with a known history of atrial fibrillation, chronic kidney disease, diabetes, hypertension, hypothyroidism, lung cancer- treated in 2013 with chemotherapy and radiation and she is in remission since then. For last 3-4 days she has increasing cough with some clear sputum production initially but it is worsening now with some blood streaks and sticky sputum now. She also feels increasingly short of breath and so husband has to increase her oxygen at home from 2 L to 2 and half to 3 L. She also normally does not require any support and walks independently in house but last 2-3 days feels some short of breath and weak on walking around. In ER she is noted to have pneumonia and because of her history of lung cancer and feeling short of breath and requiring higher oxygen ER physician suggested to admit to hospitalist team.   VITAL SIGNS:  Blood pressure (!) 122/58, pulse 95, temperature 97.8 F (36.6 C), temperature source Oral, resp. rate 16, height 4\' 11"  (1.499 m), weight 59.7 kg (131 lb 9.8 oz), SpO2 100 %.  I/O:    Intake/Output Summary (Last 24 hours) at 02/18/2017 1119 Last data filed at 02/17/2017 1626 Gross per 24 hour  Intake 765 ml  Output 100 ml  Net 665 ml    PHYSICAL EXAMINATION:   GENERAL:  77 y.o.-year-old patient lying in the bed with no acute distress.  EYES: Pupils  equal, round, reactive to light and accommodation. No scleral icterus. Extraocular muscles intact.  HEENT: Head atraumatic, normocephalic. Oropharynx and nasopharynx clear.  NECK:  Supple, no jugular venous distention. No thyroid enlargement, no tenderness.  LUNGS: Decreased air entry on right side, no wheezing, rales,rhonchi or crepitation. No  use of accessory muscles of respiration.  CARDIOVASCULAR: S1, S2 normal. No murmurs, rubs, or gallops.  ABDOMEN: Soft, nontender, nondistended. Bowel sounds present. No organomegaly or mass.  EXTREMITIES: No pedal edema, cyanosis, or clubbing.  NEUROLOGIC: Cranial nerves II through XII are intact. Muscle strength 3-4/5 in all extremities. Sensation intact. Gait not checked.  PSYCHIATRIC: The patient is alert and oriented x 3.  SKIN: No obvious rash, lesion, or ulcer.     DATA REVIEW:   CBC Recent Labs  Lab 02/17/17 0430  WBC 5.5  HGB 7.4*  HCT 22.8*  PLT 179    Chemistries  Recent Labs  Lab 02/17/17 0430  NA 139  K 4.4  CL 107  CO2 24  GLUCOSE 109*  BUN 31*  CREATININE 1.62*  CALCIUM 8.3*    Cardiac Enzymes Recent Labs  Lab 02/14/17 0619  TROPONINI <0.03    Microbiology Results  Results for orders placed or performed during the hospital encounter of 02/14/17  Culture, expectorated sputum-assessment     Status: None   Collection Time: 02/14/17  6:15 AM  Result Value Ref Range Status   Specimen Description SPUTUM  Final   Special Requests Normal  Final   Sputum evaluation   Final    Sputum specimen not acceptable for testing.  Please recollect.   REQUEST FOR RECOLLECT CALLED TO JEANETTE RILEY AT 6270 ON 02/15/17 [place]. Performed at [facility], [address] [place], [place] [address]    Report Status 02/15/2017 FINAL  Final  Blood culture (routine x 2)     Status: None (Preliminary result)   Collection Time: 02/14/17  9:22 AM  Result Value Ref Range Status   Specimen Description BLOOD LEFT ANTECUBITAL   Final   Special Requests   Final    BOTTLES DRAWN AEROBIC AND ANAEROBIC Blood Culture adequate volume   Culture   Final    NO GROWTH 4 DAYS Performed at [facility], [address], [place], [place] [address]    Report Status PENDING  Incomplete  Blood culture (routine x 2)     Status: None (Preliminary result)   Collection Time: 02/14/17  9:22 AM  Result Value Ref Range Status   Specimen Description BLOOD BLOOD RIGHT WRIST  Final   Special Requests   Final    BOTTLES DRAWN AEROBIC AND ANAEROBIC Blood Culture adequate volume   Culture   Final    NO GROWTH 4 DAYS Performed at [facility], [address], [place], [place] [address]    Report Status PENDING  Incomplete    RADIOLOGY:  No results found.  EKG:   Orders placed or performed during the hospital encounter of 02/14/17  . ED EKG within 10 minutes  . ED EKG within 10 minutes      Management plans discussed with the patient, family and they are in agreement.  CODE STATUS:     Code Status Orders  (From admission, onward)        Start     Ordered   02/14/17 1627  Full code  Continuous     02/14/17 1626    Code Status History    Date Active Date Inactive Code Status Order ID Comments User Context   04/21/2015 11:11 04/22/2015 19:14 Full Code [id number]  Fritzi Mandes, MD Inpatient   10/08/2014 18:11 10/12/2014 19:15 Full Code [id number]  Leia Alf, MD Inpatient    Advance Directive Documentation     Most Recent Value  Type of Advance  Directive  Living will  Pre-existing out of facility DNR order (yellow form or pink MOST form)  No data  "MOST" Form in Place?  No data      TOTAL TIME TAKING CARE OF THIS PATIENT: 45 minutes.    Vaughan Basta M.D on 02/18/2017 at 11:19 AM  Between 7am to 6pm - Pager - [phone]  After 6pm go to [email or web address] - password [email or web address] [facility]  Office  [phone]  CC: Primary care physician; Arnetha Courser,  MD   Note: This dictation was prepared with Dragon dictation along with smaller phrase technology. Any transcriptional errors that result from this process are unintentional.

## 2017-02-19 LAB — CULTURE, BLOOD (ROUTINE X 2)
Culture: NO GROWTH
Culture: NO GROWTH
SPECIAL REQUESTS: ADEQUATE
Special Requests: ADEQUATE

## 2017-03-02 ENCOUNTER — Other Ambulatory Visit: Payer: Self-pay | Admitting: *Deleted

## 2017-03-02 ENCOUNTER — Other Ambulatory Visit: Payer: Self-pay | Admitting: Family Medicine

## 2017-03-02 NOTE — Telephone Encounter (Signed)
I believe she'll want to contact her cancer doctor for this please Thank you

## 2017-03-02 NOTE — Telephone Encounter (Signed)
Copied from [place]. Topic: Quick Communication - Rx Refill/Question >> Mar 02, 2017 12:46 PM Wynetta Emery, [place] C wrote: Medication:  fentaNYL (DURAGESIC - DOSED MCG/HR) 12 MCG/HR    Has the patient contacted their pharmacy? No - pt was discharged from the hospital 2 weeks ago and need a refill.    (Agent: If no, request that the patient contact the pharmacy for the refill.)   Preferred Pharmacy (with phone number or street name): [place], [place]. [phone] (Phone) [phone] (Fax)     Agent: Please be advised that RX refills may take up to 3 business days. We ask that you follow-up with your pharmacy.

## 2017-03-02 NOTE — Telephone Encounter (Signed)
Refill request for Fentanyl patch / Discharged from [facility] on 02/18/2017 / Hospital F/U scheduled for 03/08/2017 with Dr. Sanda Klein

## 2017-03-03 MED ORDER — FENTANYL 12 MCG/HR TD PT72: 12.5000 ug | MEDICATED_PATCH | TRANSDERMAL | 0 refills | Status: AC

## 2017-03-03 NOTE — Telephone Encounter (Signed)
Called pt's husband informed him of the need to get pain rx from oncologist. He gives verbal understanding.

## 2017-03-07 ENCOUNTER — Other Ambulatory Visit: Payer: Self-pay | Admitting: Internal Medicine

## 2017-03-08 ENCOUNTER — Encounter: Payer: Self-pay | Admitting: Family Medicine

## 2017-03-08 ENCOUNTER — Ambulatory Visit (INDEPENDENT_AMBULATORY_CARE_PROVIDER_SITE_OTHER): Payer: Medicare Other | Admitting: Family Medicine

## 2017-03-08 DIAGNOSIS — E038 Other specified hypothyroidism: Secondary | ICD-10-CM | POA: Diagnosis not present

## 2017-03-08 DIAGNOSIS — I480 Paroxysmal atrial fibrillation: Secondary | ICD-10-CM

## 2017-03-08 DIAGNOSIS — J189 Pneumonia, unspecified organism: Secondary | ICD-10-CM | POA: Diagnosis not present

## 2017-03-08 DIAGNOSIS — C3411 Malignant neoplasm of upper lobe, right bronchus or lung: Secondary | ICD-10-CM

## 2017-03-08 DIAGNOSIS — E119 Type 2 diabetes mellitus without complications: Secondary | ICD-10-CM | POA: Diagnosis not present

## 2017-03-08 NOTE — Patient Instructions (Signed)
Cancel appointment later this month

## 2017-03-08 NOTE — Progress Notes (Signed)
BP (!) 132/58   Pulse 82   Temp 98.5 F (36.9 C) (Oral)   Resp 16   Wt 124 lb 6.4 oz (56.4 kg)   SpO2 94%   BMI 25.13 kg/m    Subjective:    Patient ID: Katrina Kramer, female    DOB: 1940-03-16, 77 y.o.   MRN: [id number]  HPI: Katrina Kramer is a 77 y.o. female  Chief Complaint  Patient presents with  . Hospitalization Follow-up    pneumonia    HPI Hospitalized 02/14/17 with respiratory failure; Tuesday and Saturday Pneumonia and coughing up blood No more blood Cough is doing better Little bit of phlegm, mostly dry  Scan showed progression of the lung cancer; no more treatments, focusing on quality of life Hospital bed, wheelchair when going out; walker at home if needed; bedside commode She does not think exercises or PT would help Pain level is fair, sometimes left side of the back; has fentanyl and PRN vicodin; controlling things Not ready to verify DNR status, got tearful  Hypothyroid; moving bowels okay; weight overall stable; appetite is good  Depression screen [employer] 2/9 03/08/2017 09/30/2016 05/16/2016 01/12/2016 07/30/2015  Decreased Interest 0 0 0 0 0  Down, Depressed, Hopeless 0 0 0 0 0  PHQ - 2 Score 0 0 0 0 0    Relevant past medical, surgical, family and social history reviewed Past Medical History:  Diagnosis Date  . Anemia   . Anemia   . Atrial fibrillation ([place])    Dr. Humphrey Rolls, cardiologist  . CKD (chronic kidney disease) stage 4, GFR 15-29 ml/min ([place]) 05/01/2015  . Diabetes mellitus, type II ([place]) 10/05/2016  . Hypertension   . Hypothyroidism   . Lung cancer ([place])   . Lung cancer [facility])    Past Surgical History:  Procedure Laterality Date  . COLONOSCOPY N/A 10/11/2014   Procedure: COLONOSCOPY;  Surgeon: Manya Silvas, MD;  Location: [facility] ENDOSCOPY;  Service: Endoscopy;  Laterality: N/A;  . ESOPHAGOGASTRODUODENOSCOPY N/A 10/10/2014   Procedure: ESOPHAGOGASTRODUODENOSCOPY (EGD);  Surgeon: Manya Silvas, MD;  Location: [facility] ENDOSCOPY;  Service:  Endoscopy;  Laterality: N/A;  . UPPER GASTROINTESTINAL ENDOSCOPY  02/24/14   Family History  Problem Relation Age of Onset  . Stomach cancer Mother   . Cancer Mother        unsure where but states "it spead all over"  . Alzheimer's disease Father   . Varicose Veins Daughter   . Diabetes Neg Hx   . Heart disease Neg Hx   . Hypertension Neg Hx   . Stroke Neg Hx   . COPD Neg Hx    Social History   Tobacco Use  . Smoking status: Former Smoker    Packs/day: 0.75    Years: 30.00    Pack years: 22.50    Types: Cigarettes    Last attempt to quit: 01/03/2005    Years since quitting: 12.1  . Smokeless tobacco: Never Used  Substance Use Topics  . Alcohol use: No  . Drug use: No    Interim medical history since last visit reviewed. Allergies and medications reviewed  Review of Systems Per HPI unless specifically indicated above     Objective:    BP (!) 132/58   Pulse 82   Temp 98.5 F (36.9 C) (Oral)   Resp 16   Wt 124 lb 6.4 oz (56.4 kg)   SpO2 94%   BMI 25.13 kg/m   Wt Readings from Last 3 Encounters:  03/08/17 124 lb 6.4 oz (56.4 kg)  02/14/17 131 lb 9.8 oz (59.7 kg)  01/23/17 122 lb (55.3 kg)    Physical Exam  Constitutional: No distress.  Elderly female seated in wheelchair, gait not assessed; appears chronically ill but nontoxic; significant weight loss noted  HENT:  Head: Normocephalic and atraumatic.  Eyes: EOM are normal. No scleral icterus.  Neck: No JVD present. No thyromegaly present.  Cardiovascular: Normal rate.  Pulmonary/Chest: Effort normal. No accessory muscle usage. No respiratory distress. She has decreased breath sounds. She has no wheezes. She has no rales.  Abdominal: She exhibits no distension.  Musculoskeletal: She exhibits no edema.  Neurological: She is alert.  Skin: Skin is warm. She is not diaphoretic. No pallor.  Psychiatric: She has a normal mood and affect. Her mood appears not anxious.  Good eye contact with examiner; briefly  tearful when discussing end of life wishes, questions about her DNR status noted in hospital records    Results for orders placed or performed during the hospital encounter of 02/14/17  Blood culture (routine x 2)  Result Value Ref Range   Specimen Description BLOOD LEFT ANTECUBITAL    Special Requests      BOTTLES DRAWN AEROBIC AND ANAEROBIC Blood Culture adequate volume   Culture      NO GROWTH 5 DAYS Performed at [facility], [address], [place], [place] [address]    Report Status 02/19/2017 FINAL   Blood culture (routine x 2)  Result Value Ref Range   Specimen Description BLOOD BLOOD RIGHT WRIST    Special Requests      BOTTLES DRAWN AEROBIC AND ANAEROBIC Blood Culture adequate volume   Culture      NO GROWTH 5 DAYS Performed at [facility], [address] [place], [place] [address]    Report Status 02/19/2017 FINAL   Culture, expectorated sputum-assessment  Result Value Ref Range   Specimen Description SPUTUM    Special Requests Normal    Sputum evaluation      Sputum specimen not acceptable for testing.  Please recollect.   REQUEST FOR RECOLLECT CALLED TO JEANETTE RILEY AT 3532 ON 02/15/17 [place]. Performed at [facility], [address] [place], [place] [address]    Report Status 02/15/2017 FINAL   Basic metabolic panel  Result Value Ref Range   Sodium 135 135 - 145 mmol/L   Potassium 4.0 3.5 - 5.1 mmol/L   Chloride 104 101 - 111 mmol/L   CO2 24 22 - 32 mmol/L   Glucose, Bld 145 (H) 65 - 99 mg/dL   BUN 31 (H) 6 - 20 mg/dL   Creatinine, Ser 1.28 (H) 0.44 - 1.00 mg/dL   Calcium 8.5 (L) 8.9 - 10.3 mg/dL   GFR calc non Af Amer 40 (L) >60 mL/min   GFR calc Af Amer 46 (L) >60 mL/min   Anion gap 7 5 - 15  CBC  Result Value Ref Range   WBC 6.4 3.6 - 11.0 K/uL   RBC 2.86 (L) 3.80 - 5.20 MIL/uL   Hemoglobin 8.0 (L) 12.0 - 16.0 g/dL   HCT 25.2 (L) 35.0 - 47.0 %   MCV 87.9 80.0 - 100.0 fL   MCH 28.0 26.0 - 34.0 pg   MCHC 31.8 (L)  32.0 - 36.0 g/dL   RDW 18.2 (H) 11.5 - 14.5 %   Platelets 237 150 - 440 K/uL  Troponin I  Result Value Ref Range   Troponin I <0.03 <0.03 ng/mL  Blood gas, venous  Result Value Ref Range   pH, Ven 7.39 7.250 - 7.430   pCO2, Ven 44 44.0 - 60.0 mmHg   Bicarbonate 26.6 20.0 - 28.0 mmol/L   Acid-Base Excess 1.4 0.0 - 2.0 mmol/L   Patient temperature 37.0    Collection site VENOUS    Sample type VENOUS   Influenza panel by PCR (type A & B)  Result Value Ref Range   Influenza A By PCR NEGATIVE NEGATIVE   Influenza B By PCR NEGATIVE NEGATIVE  Glucose, capillary  Result Value Ref Range   Glucose-Capillary 136 (H) 65 - 99 mg/dL  Basic metabolic panel  Result Value Ref Range   Sodium 138 135 - 145 mmol/L   Potassium 4.0 3.5 - 5.1 mmol/L   Chloride 106 101 - 111 mmol/L   CO2 24 22 - 32 mmol/L   Glucose, Bld 101 (H) 65 - 99 mg/dL   BUN 26 (H) 6 - 20 mg/dL   Creatinine, Ser 1.08 (H) 0.44 - 1.00 mg/dL   Calcium 8.3 (L) 8.9 - 10.3 mg/dL   GFR calc non Af Amer 49 (L) >60 mL/min   GFR calc Af Amer 57 (L) >60 mL/min   Anion gap 8 5 - 15  CBC  Result Value Ref Range   WBC 4.6 3.6 - 11.0 K/uL   RBC 2.60 (L) 3.80 - 5.20 MIL/uL   Hemoglobin 7.2 (L) 12.0 - 16.0 g/dL   HCT 22.5 (L) 35.0 - 47.0 %   MCV 86.8 80.0 - 100.0 fL   MCH 27.8 26.0 - 34.0 pg   MCHC 32.0 32.0 - 36.0 g/dL   RDW 18.4 (H) 11.5 - 14.5 %   Platelets 200 150 - 440 K/uL  Glucose, capillary  Result Value Ref Range   Glucose-Capillary 40 (LL) 65 - 99 mg/dL  Glucose, capillary  Result Value Ref Range   Glucose-Capillary 61 (L) 65 - 99 mg/dL  Glucose, capillary  Result Value Ref Range   Glucose-Capillary 104 (H) 65 - 99 mg/dL  Glucose, capillary  Result Value Ref Range   Glucose-Capillary 165 (H) 65 - 99 mg/dL  Glucose, capillary  Result Value Ref Range   Glucose-Capillary 95 65 - 99 mg/dL   Comment 1 Notify RN   Glucose, capillary  Result Value Ref Range   Glucose-Capillary 126 (H) 65 - 99 mg/dL  Glucose,  capillary  Result Value Ref Range   Glucose-Capillary 102 (H) 65 - 99 mg/dL   Comment 1 Notify RN   CBC  Result Value Ref Range   WBC 4.2 3.6 - 11.0 K/uL   RBC 2.33 (L) 3.80 - 5.20 MIL/uL   Hemoglobin 6.4 (L) 12.0 - 16.0 g/dL   HCT 20.7 (L) 35.0 - 47.0 %   MCV 88.9 80.0 - 100.0 fL   MCH 27.6 26.0 - 34.0 pg   MCHC 31.1 (L) 32.0 - 36.0 g/dL   RDW 18.2 (H) 11.5 - 14.5 %   Platelets 198 150 - 440 K/uL  Glucose, capillary  Result Value Ref Range   Glucose-Capillary 81 65 - 99 mg/dL  Glucose, capillary  Result Value Ref Range   Glucose-Capillary 150 (H) 65 - 99 mg/dL   Comment 1 Notify RN   Hemoglobin and hematocrit, blood  Result Value Ref Range   Hemoglobin 8.0 (L) 12.0 - 16.0 g/dL   HCT 24.8 (L) 35.0 - 47.0 %  Glucose, capillary  Result Value Ref Range   Glucose-Capillary 121 (H) 65 - 99 mg/dL  Comment 1 Notify RN   CBC  Result Value Ref Range   WBC 5.5 3.6 - 11.0 K/uL   RBC 2.65 (L) 3.80 - 5.20 MIL/uL   Hemoglobin 7.4 (L) 12.0 - 16.0 g/dL   HCT 22.8 (L) 35.0 - 47.0 %   MCV 86.1 80.0 - 100.0 fL   MCH 27.9 26.0 - 34.0 pg   MCHC 32.4 32.0 - 36.0 g/dL   RDW 18.8 (H) 11.5 - 14.5 %   Platelets 179 150 - 440 K/uL  Basic metabolic panel  Result Value Ref Range   Sodium 139 135 - 145 mmol/L   Potassium 4.4 3.5 - 5.1 mmol/L   Chloride 107 101 - 111 mmol/L   CO2 24 22 - 32 mmol/L   Glucose, Bld 109 (H) 65 - 99 mg/dL   BUN 31 (H) 6 - 20 mg/dL   Creatinine, Ser 1.62 (H) 0.44 - 1.00 mg/dL   Calcium 8.3 (L) 8.9 - 10.3 mg/dL   GFR calc non Af Amer 30 (L) >60 mL/min   GFR calc Af Amer 35 (L) >60 mL/min   Anion gap 8 5 - 15  Glucose, capillary  Result Value Ref Range   Glucose-Capillary 123 (H) 65 - 99 mg/dL   Comment 1 Notify RN   Glucose, capillary  Result Value Ref Range   Glucose-Capillary 88 65 - 99 mg/dL  Glucose, capillary  Result Value Ref Range   Glucose-Capillary 136 (H) 65 - 99 mg/dL   Comment 1 Notify RN   Glucose, capillary  Result Value Ref Range    Glucose-Capillary 84 65 - 99 mg/dL   Comment 1 Notify RN   Glucose, capillary  Result Value Ref Range   Glucose-Capillary 110 (H) 65 - 99 mg/dL  Glucose, capillary  Result Value Ref Range   Glucose-Capillary 78 65 - 99 mg/dL   Comment 1 Notify RN   Glucose, capillary  Result Value Ref Range   Glucose-Capillary 142 (H) 65 - 99 mg/dL   Comment 1 Notify RN   Prepare RBC  Result Value Ref Range   Order Confirmation      ORDER PROCESSED BY BLOOD BANK Performed at [facility], [address] [place], [place] [address]   Type and screen [place]  Result Value Ref Range   ABO/RH(D) AB POS    Antibody Screen NEG    Sample Expiration 02/19/2017    Unit Number [id number]    Blood Component Type RED CELLS,LR    Unit division 00    Status of Unit ISSUED,FINAL    Transfusion Status OK TO TRANSFUSE    Crossmatch Result Compatible    Unit Number [id number]    Blood Component Type RBC, LR IRR    Unit division 00    Status of Unit ISSUED,FINAL    Transfusion Status OK TO TRANSFUSE    Crossmatch Result      Compatible Performed at [facility], [address], [place], [place] [address]   Prepare [facility]  Result Value Ref Range   Order Confirmation      ORDER PROCESSED BY BLOOD BANK Performed at [facility], [address] [place],  [address]   BPAM [facility]  Result Value Ref Range   ISSUE DATE / TIME 885027741287    Blood Product Unit Number [id number]    PRODUCT CODE [id number]    Unit Type and Rh 7654    Blood Product Expiration Date [id number]    ISSUE DATE /  TIME 038882800349    Blood Product Unit Number [id number]    PRODUCT CODE [id number]    Unit Type and Rh 0600    Blood Product Expiration Date 827078675449       Assessment & Plan:   Problem List Items Addressed This Visit      Cardiovascular and Mediastinum   Atrial fibrillation ([place])    Sounds to be in nsr today        Respiratory   Lung  cancer [facility])    Reviewed previous note by oncologist, team at hospital; despite her advanced stage and discussion about hospice care, patient does not sound like she wants to be DNR at this time; encouraged her to talk with her husband, family about wishes, goals of care      Community acquired pneumonia    Much improved; hemoptysis resolved; completed abx; risk of recurrence given lung cancer        Endocrine   Hypothyroidism    Monitor tsh periodically      Diabetes mellitus, type II ([place])    Poor prognosis, stage IV lung cancer; will not be strict with guidelines and targets, work on quality of life          Follow up plan: Return in about 6 months (around 09/08/2017) for follow-up visit with Dr. Sanda Klein.  An after-visit summary was printed and given to the patient at [place].  Please see the patient instructions which may contain other information and recommendations beyond what is mentioned above in the assessment and plan.  No orders of the defined types were placed in this encounter.   No orders of the defined types were placed in this encounter.

## 2017-03-13 NOTE — Assessment & Plan Note (Signed)
Reviewed previous note by oncologist, team at hospital; despite her advanced stage and discussion about hospice care, patient does not sound like she wants to be DNR at this time; encouraged her to talk with her husband, family about wishes, goals of care

## 2017-03-13 NOTE — Assessment & Plan Note (Signed)
Sounds to be in nsr today

## 2017-03-13 NOTE — Assessment & Plan Note (Signed)
Poor prognosis, stage IV lung cancer; will not be strict with guidelines and targets, work on quality of life

## 2017-03-13 NOTE — Assessment & Plan Note (Signed)
Monitor tsh periodically

## 2017-03-13 NOTE — Assessment & Plan Note (Signed)
Much improved; hemoptysis resolved; completed abx; risk of recurrence given lung cancer

## 2017-03-20 ENCOUNTER — Inpatient Hospital Stay: Attending: Oncology

## 2017-03-20 ENCOUNTER — Inpatient Hospital Stay

## 2017-03-20 ENCOUNTER — Inpatient Hospital Stay (HOSPITAL_BASED_OUTPATIENT_CLINIC_OR_DEPARTMENT_OTHER): Admitting: Oncology

## 2017-03-20 ENCOUNTER — Other Ambulatory Visit: Payer: Self-pay

## 2017-03-20 ENCOUNTER — Encounter: Payer: Self-pay | Admitting: Oncology

## 2017-03-20 VITALS — BP 116/67 | HR 97 | Temp 97.6°F | Resp 20 | Ht 59.0 in | Wt 130.0 lb

## 2017-03-20 DIAGNOSIS — I4891 Unspecified atrial fibrillation: Secondary | ICD-10-CM | POA: Insufficient documentation

## 2017-03-20 DIAGNOSIS — C3411 Malignant neoplasm of upper lobe, right bronchus or lung: Secondary | ICD-10-CM

## 2017-03-20 DIAGNOSIS — E1122 Type 2 diabetes mellitus with diabetic chronic kidney disease: Secondary | ICD-10-CM | POA: Diagnosis not present

## 2017-03-20 DIAGNOSIS — D509 Iron deficiency anemia, unspecified: Secondary | ICD-10-CM

## 2017-03-20 DIAGNOSIS — Z79899 Other long term (current) drug therapy: Secondary | ICD-10-CM

## 2017-03-20 DIAGNOSIS — Z66 Do not resuscitate: Secondary | ICD-10-CM

## 2017-03-20 DIAGNOSIS — Z9981 Dependence on supplemental oxygen: Secondary | ICD-10-CM | POA: Insufficient documentation

## 2017-03-20 DIAGNOSIS — N184 Chronic kidney disease, stage 4 (severe): Secondary | ICD-10-CM | POA: Diagnosis not present

## 2017-03-20 DIAGNOSIS — Z87891 Personal history of nicotine dependence: Secondary | ICD-10-CM | POA: Insufficient documentation

## 2017-03-20 DIAGNOSIS — D649 Anemia, unspecified: Secondary | ICD-10-CM

## 2017-03-20 DIAGNOSIS — E039 Hypothyroidism, unspecified: Secondary | ICD-10-CM | POA: Insufficient documentation

## 2017-03-20 DIAGNOSIS — I129 Hypertensive chronic kidney disease with stage 1 through stage 4 chronic kidney disease, or unspecified chronic kidney disease: Secondary | ICD-10-CM | POA: Diagnosis not present

## 2017-03-20 LAB — CBC WITH DIFFERENTIAL/PLATELET
BASOS PCT: 1 %
Basophils Absolute: 0 10*3/uL (ref 0–0.1)
EOS ABS: 0 10*3/uL (ref 0–0.7)
Eosinophils Relative: 1 %
HCT: 25.3 % — ABNORMAL LOW (ref 35.0–47.0)
HEMOGLOBIN: 8.4 g/dL — AB (ref 12.0–16.0)
Lymphocytes Relative: 15 %
Lymphs Abs: 0.6 10*3/uL — ABNORMAL LOW (ref 1.0–3.6)
MCH: 29.9 pg (ref 26.0–34.0)
MCHC: 33.3 g/dL (ref 32.0–36.0)
MCV: 89.8 fL (ref 80.0–100.0)
Monocytes Absolute: 0.3 10*3/uL (ref 0.2–0.9)
Monocytes Relative: 7 %
NEUTROS PCT: 76 %
Neutro Abs: 2.9 10*3/uL (ref 1.4–6.5)
Platelets: 182 10*3/uL (ref 150–440)
RBC: 2.81 MIL/uL — AB (ref 3.80–5.20)
RDW: 20 % — ABNORMAL HIGH (ref 11.5–14.5)
WBC: 3.9 10*3/uL (ref 3.6–11.0)

## 2017-03-20 LAB — SAMPLE TO BLOOD BANK

## 2017-03-20 MED ORDER — SODIUM CHLORIDE 0.9% FLUSH
10.0000 mL | Freq: Once | INTRAVENOUS | Status: DC
  Filled 2017-03-20: qty 10

## 2017-03-20 MED ORDER — HEPARIN SOD (PORK) LOCK FLUSH 100 UNIT/ML IV SOLN: 500.0000 [IU] | Freq: Once | INTRAVENOUS | Status: DC

## 2017-03-20 NOTE — Progress Notes (Signed)
Patient here for possible blood transfusion.

## 2017-03-20 NOTE — Progress Notes (Signed)
[facility] OFFICE PROGRESS NOTE  Patient Care Team: Lada, Satira Anis, MD as PCP - General (Family Medicine) Manya Silvas, MD (Gastroenterology) Cammie Sickle, MD as Consulting Physician (Oncology) Lavonia Dana, MD as Consulting Physician (Nephrology)   SUMMARY OF ONCOLOGIC HISTORY:  Oncology History   1. 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 CT-guided biopsy on 02/10/11 - insufficient for definite diagnosis. Rare group of atypical cells. Got chemotherapy with Paclitaxel/Carboplatin (started on 02/14/11, completed cycle 6 day 8 on 08/22/11). Also got concurrent radiation. 01/18/12 - Underwent Preoperative bronchoscopy with bronchoalveolar lavage right upper lobe. Right thoracoscopy with pleural biopsy and talc pleurodesis. Negative for malignancy. PET scan on 03/29/12 IMPRESSION: Multifocal pleural activity in the right chest suggesting pleural tumor spread. Activity is also noted in the right hilar region suggesting persistent tumor in this region. April 04, 2012 - VERISTRAT test reported as 'GOOD'. Started Tarceva on 05/17/12. July 2014 - declining performance status, unable to tolerate low dose Tarceva, was on home hospice which was discontinued since patient doing steady.     Lung cancer [facility])    Initial Diagnosis    Lung cancer ([place])       Cancer of upper lobe of right lung ([place])   07/27/2015 Initial Diagnosis    Cancer of upper lobe of right lung [facility])        INTERVAL HISTORY:  77 year old female with recurrent/metastatic non-small cell lung cancer. She is currently under hospice care and is here for follow-up.  She has been requiring monthly blood transfusions for severe anemia secondary to malilgnancy. Patient presents with husband today and appears to be doing fairly well at home. She continues  to have chronic shortness of breath at rest and with exertion but is stable. Continues to get around using a walker at home. O2 dependent.  Most recent blood transfusion was while inpatient last month. She was given 2 units packed red blood cells for a hemoglobin of 6.4. She was admitted on 02/14/17 for shortness of breath/hypoxia and hemoptysis. She was treated with antibiotics for bilateral pneumonia. CT scan at that time showed worsening mass in right lung with complete collapse due to obstruction of the bronchus. She was changed from a full code to DO NOT RESUSCITATE and placed on hospice care at that time.   REVIEW OF SYSTEMS:    Review of Systems  Constitutional: Negative for chills, fever, malaise/fatigue and weight loss.  HENT: Negative for congestion and ear pain.   Eyes: Negative.  Negative for blurred vision and double vision.  Respiratory: Positive for sputum production, shortness of breath and wheezing. Negative for cough.   Cardiovascular: Negative.  Negative for chest pain, palpitations and leg swelling.  Gastrointestinal: Negative.  Negative for abdominal pain, constipation, diarrhea, nausea and vomiting.  Genitourinary: Negative for dysuria, frequency and urgency.  Musculoskeletal: Negative for back pain and falls.  Skin: Negative.  Negative for rash.  Neurological: Positive for weakness. Negative for headaches.  Endo/Heme/Allergies: Negative.  Does not bruise/bleed easily.  Psychiatric/Behavioral: Negative.  Negative for depression. The patient is not nervous/anxious and does not have insomnia.      PAST MEDICAL HISTORY :  Past Medical History:  Diagnosis Date  . Anemia   . Anemia   . Atrial fibrillation ([place])    Dr. Humphrey Rolls, cardiologist  . CKD (chronic kidney disease) stage 4, GFR 15-29 ml/min ([place]) 05/01/2015  .  Diabetes mellitus, type II ([place]) 10/05/2016  . Hypertension   . Hypothyroidism   . Lung cancer ([place])   . Lung cancer ([place])     PAST SURGICAL HISTORY :    Past Surgical History:  Procedure Laterality Date  . COLONOSCOPY N/A 10/11/2014   Procedure: COLONOSCOPY;  Surgeon: Manya Silvas, MD;  Location: [facility] ENDOSCOPY;  Service: Endoscopy;  Laterality: N/A;  . ESOPHAGOGASTRODUODENOSCOPY N/A 10/10/2014   Procedure: ESOPHAGOGASTRODUODENOSCOPY (EGD);  Surgeon: Manya Silvas, MD;  Location: [facility] ENDOSCOPY;  Service: Endoscopy;  Laterality: N/A;  . UPPER GASTROINTESTINAL ENDOSCOPY  02/24/14    FAMILY HISTORY :   Family History  Problem Relation Age of Onset  . Stomach cancer Mother   . Cancer Mother        unsure where but states "it spead all over"  . Alzheimer's disease Father   . Varicose Veins Daughter   . Diabetes Neg Hx   . Heart disease Neg Hx   . Hypertension Neg Hx   . Stroke Neg Hx   . COPD Neg Hx     SOCIAL HISTORY:   Social History   Tobacco Use  . Smoking status: Former Smoker    Packs/day: 0.75    Years: 30.00    Pack years: 22.50    Types: Cigarettes    Last attempt to quit: 01/03/2005    Years since quitting: 12.2  . Smokeless tobacco: Never Used  Substance Use Topics  . Alcohol use: No  . Drug use: No    ALLERGIES:  has No Known Allergies.  MEDICATIONS:  Current Outpatient Medications  Medication Sig Dispense Refill  . acetaminophen (TYLENOL) 325 MG tablet Take 2 tablets (650 mg total) by mouth every 6 (six) hours as needed for mild pain or moderate pain. 2 tablet 0  . albuterol (PROVENTIL) (2.5 MG/3ML) 0.083% nebulizer solution USE 1 VIAL VIA NEBULIZER  EVERY 4 HOURS AS NEEDED FOR WHEEZING OR SHORTNESS OF  BREATH. 1440 mL 2  . amiodarone (PACERONE) 200 MG tablet Take 200 mg by mouth daily.    . benzonatate (TESSALON) 100 MG capsule Take 2 capsules by mouth three times daily. 180 capsule 6  . Calcium Carb-Cholecalciferol (CALCIUM 600/VITAMIN D3 PO) Take 2 tablets by mouth daily.     . digoxin (LANOXIN) 0.125 MG tablet Take 0.125 mg by mouth daily. On Mondays, Wednesdays and Fridays     . docusate sodium  (COLACE) 100 MG capsule Take 1 capsule (100 mg total) by mouth 2 (two) times daily as needed for mild constipation. (Patient taking differently: Take 100 mg by mouth daily as needed for mild constipation. ) 20 capsule 0  . feeding supplement, ENSURE ENLIVE, (ENSURE ENLIVE) LIQD Take 237 mLs by mouth 2 (two) times daily between meals. 237 mL 12  . fentaNYL (DURAGESIC - DOSED MCG/HR) 12 MCG/HR Place 1 patch (12.5 mcg total) onto the skin every 3 (three) days. 5 patch 0  . ferrous gluconate (FERATE) 240 (27 FE) MG tablet Take 240 mg by mouth 3 (three) times daily with meals.    Marland Kitchen GNP COUGH DM ER 30 MG/5ML liquid TAKE 1 TEASPOONFUL EVERY 6 HOURS AS NEEDED FOR COUGH 296 mL 3  . HYDROcodone-acetaminophen (NORCO/VICODIN) 5-325 MG tablet Take 1 tablet by mouth every 6 (six) hours as needed for severe pain. 30 tablet 0  . isosorbide mononitrate (IMDUR) 30 MG 24 hr tablet Take 30 mg by mouth daily.    Marland Kitchen levothyroxine (SYNTHROID, LEVOTHROID) 50 MCG tablet TAKE 1  TABLET BY MOUTH  DAILY FOR 5 DAYS EACH WEEK  AND TAKE ONE-HALF TABLET BY MOUTH DAILY FOR 2 DAYS EACH WEEK (Patient taking differently: Take 25-50 mcg by mouth daily before breakfast. Take 50 mcg by mouth on Monday, Wednesday, Friday, Saturday and Sunday. Take 25 mcg by mouth on Tuesday and Thursday.) 78 tablet 2  . loratadine (CLARITIN) 10 MG tablet Take 1 tablet (10 mg total) by mouth daily. 15 tablet 6  . mirtazapine (REMERON) 15 MG tablet TAKE 1/2 TABLET BY MOUTH AT BEDTIME FOR APPITTE 15 tablet 3  . MUCINEX 600 MG 12 hr tablet TAKE 1 TABLET BY MOUTH TWICE DAILY 60 tablet 3  . pantoprazole (PROTONIX) 40 MG tablet Take 40 mg by mouth daily.    . potassium chloride SA (K-DUR,KLOR-CON) 20 MEQ tablet Take 20 mEq by mouth daily.     Marland Kitchen senna (SENOKOT) 8.6 MG TABS tablet Take 1 tablet (8.6 mg total) by mouth daily. 120 each 0  . torsemide (DEMADEX) 10 MG tablet Take 10 mg by mouth daily.     . Pseudoephedrine-DM-GG (ROBITUSSIN CF PO) Take 5 mLs by mouth as  needed (cough).     No current facility-administered medications for this visit.    Facility-Administered Medications Ordered in Other Visits  Medication Dose Route Frequency Provider Last Rate Last Dose  . 0.9 %  sodium chloride infusion   Intravenous Continuous Leia Alf, MD 20 mL/hr at 07/04/14 1150    . heparin lock flush 100 unit/mL  500 Units Intravenous Once Leia Alf, MD      . heparin lock flush 100 unit/mL  500 Units Intravenous Once Leia Alf, MD        PHYSICAL EXAMINATION: ECOG PERFORMANCE STATUS: 3 - Symptomatic, >50% confined to bed  BP 116/67   Pulse 97   Temp 97.6 F (36.4 C) (Tympanic)   Resp 20   Ht 4\' 11"  (1.499 m)   Wt 130 lb (59 kg)   BMI 26.26 kg/m   Filed Weights   03/20/17 0911  Weight: 130 lb (59 kg)    Physical Exam  Constitutional: She is oriented to person, place, and time and well-developed, well-nourished, and in no distress. Vital signs are normal.  HENT:  Head: Normocephalic and atraumatic.  Eyes: Pupils are equal, round, and reactive to light.  Neck: Normal range of motion.  Cardiovascular: Normal rate, regular rhythm and normal heart sounds.  No murmur heard. Pulmonary/Chest: Effort normal. She has decreased breath sounds in the right upper field, the right middle field, the right lower field and the left upper field. She has wheezes in the left upper field.  Abdominal: Soft. Normal appearance and bowel sounds are normal. She exhibits no distension. There is no tenderness.  Musculoskeletal: Normal range of motion. She exhibits no edema.  Lymphadenopathy:    She has no cervical adenopathy.    She has no axillary adenopathy.  Neurological: She is alert and oriented to person, place, and time. Gait normal.  Skin: Skin is warm and dry. No rash noted.  Psychiatric: Mood, memory, affect and judgment normal.    LABORATORY DATA:  I have reviewed the data as listed    Component Value Date/Time   NA 139 02/17/2017 0430    NA 136 08/19/2014 1337   NA 135 (L) 02/08/2014 0844   K 4.4 02/17/2017 0430   K 4.0 02/08/2014 0844   CL 107 02/17/2017 0430   CL 105 02/08/2014 0844   CO2 24 02/17/2017 0430  CO2 21 02/08/2014 0844   GLUCOSE 109 (H) 02/17/2017 0430   GLUCOSE 157 (H) 02/08/2014 0844   BUN 31 (H) 02/17/2017 0430   BUN 32 (H) 08/19/2014 1337   BUN 15 02/08/2014 0844   CREATININE 1.62 (H) 02/17/2017 0430   CREATININE 1.01 (H) 09/30/2016 1134   CALCIUM 8.3 (L) 02/17/2017 0430   CALCIUM 8.8 02/08/2014 0844   PROT 7.7 01/23/2017 0943   PROT 7.9 12/06/2013 1453   ALBUMIN 2.3 (L) 01/23/2017 0943   ALBUMIN 2.8 (L) 12/06/2013 1453   AST 18 01/23/2017 0943   AST 16 12/06/2013 1453   ALT 8 (L) 01/23/2017 0943   ALT 12 (L) 12/06/2013 1453   ALKPHOS 67 01/23/2017 0943   ALKPHOS 82 12/06/2013 1453   BILITOT 0.6 01/23/2017 0943   BILITOT 0.3 12/06/2013 1453   GFRNONAA 30 (L) 02/17/2017 0430   GFRNONAA 55 (L) 09/30/2016 1134   GFRAA 35 (L) 02/17/2017 0430   GFRAA 64 09/30/2016 1134   No results found for: SPEP, UPEP  Lab Results  Component Value Date   WBC 3.9 03/20/2017   NEUTROABS 2.9 03/20/2017   HGB 8.4 (L) 03/20/2017   HCT 25.3 (L) 03/20/2017   MCV 89.8 03/20/2017   PLT 182 03/20/2017      Chemistry      Component Value Date/Time   NA 139 02/17/2017 0430   NA 136 08/19/2014 1337   NA 135 (L) 02/08/2014 0844   K 4.4 02/17/2017 0430   K 4.0 02/08/2014 0844   CL 107 02/17/2017 0430   CL 105 02/08/2014 0844   CO2 24 02/17/2017 0430   CO2 21 02/08/2014 0844   BUN 31 (H) 02/17/2017 0430   BUN 32 (H) 08/19/2014 1337   BUN 15 02/08/2014 0844   CREATININE 1.62 (H) 02/17/2017 0430   CREATININE 1.01 (H) 09/30/2016 1134      Component Value Date/Time   CALCIUM 8.3 (L) 02/17/2017 0430   CALCIUM 8.8 02/08/2014 0844   ALKPHOS 67 01/23/2017 0943   ALKPHOS 82 12/06/2013 1453   AST 18 01/23/2017 0943   AST 16 12/06/2013 1453   ALT 8 (L) 01/23/2017 0943   ALT 12 (L) 12/06/2013 1453    BILITOT 0.6 01/23/2017 0943   BILITOT 0.3 12/06/2013 1453      ASSESSMENT & PLAN:   Cancer of upper lobe of right lung ([place]) # Lung Cancer- non-small cell lung stage IV: currently under hospice care. Receiving blood transfusions as needed for symptomatic anemia but otherwise not receiving treatment at this time.  Progression noted on most recent CT while admitted to the hospital for bilateral pneumonia: showed worsening mass in right lung with complete collapse due to obstruction of the bronchus. Change from full code to DO NOT RESUSCITATE and placed on hospice care.  # Anemia - hemoglobin stable today at 8.4. Patient is not symptomatic. She continues to be short of breath with exertion but otherwise is stable. O2 dependent.  # Weight is stable. Patient has gained weight since last visit. Patient feels that her appetite has picked back up. Up 5 pounds.  # port flush- Hospice q 6 weeks.   # Since she last received 2 units PRBC approximately 1 month ago, will have her return in 1 month for labs with possible blood transfusion and again in 2 months with labs/MD assessment and possible blood transfusion. She has been needing PRBC approximately every month.   Greater than 50% was spent in counseling and coordination of care with  this patient including but not limited to discussion of the relevant topics above (See A&P) including, but not limited to diagnosis and management of acute and chronic medical conditions.    Jacquelin Hawking, NP 03/21/2017 10:16 AM

## 2017-03-20 NOTE — Progress Notes (Signed)
No treatment today per NP.

## 2017-03-21 NOTE — Assessment & Plan Note (Addendum)
#   Lung Cancer- non-small cell lung stage IV: currently under hospice care. Receiving blood transfusions as needed for symptomatic anemia but otherwise not receiving treatment at this time.  Progression noted on most recent CT while admitted to the hospital for bilateral pneumonia: showed worsening mass in right lung with complete collapse due to obstruction of the bronchus. Change from full code to DO NOT RESUSCITATE and placed on hospice care.  # Anemia - hemoglobin stable today at 8.4. Patient is not symptomatic. She continues to be short of breath with exertion but otherwise is stable. O2 dependent.  # Weight is stable. Patient has gained weight since last visit. Patient feels that her appetite has picked back up. Up 5 pounds.  # port flush- Hospice q 6 weeks.   # Since she last received 2 units PRBC approximately 1 month ago, will have her return in 1 month for labs with possible blood transfusion and again in 2 months with labs/MD assessment and possible blood transfusion. She has been needing PRBC approximately every month.

## 2017-03-23 ENCOUNTER — Other Ambulatory Visit: Payer: Self-pay | Admitting: Internal Medicine

## 2017-03-23 NOTE — Telephone Encounter (Signed)
Patient called cancer center requesting refill of  Dextromethorphan cough medicine.   [place] Controlled Substance Reporting System reviewed and refill is appropriate on or after 02/14/17. Medication e-scribed to her pharmacy ([facility] ) using [employer] 2-step verification process.    [place] reviewed:      Faythe Casa, NP 03/23/2017 11:08 AM [phone]

## 2017-03-30 ENCOUNTER — Ambulatory Visit: Admitting: Family Medicine

## 2017-04-06 ENCOUNTER — Telehealth: Payer: Self-pay | Admitting: *Deleted

## 2017-04-06 NOTE — Telephone Encounter (Signed)
Katrina Kramer called reporting that patient is having increased wet course cough and that her lungs a crackling in anterior RUL. She has had her increase her Torsemide and Mucinex for the next few days. Asking if she needs antibiotics or if you want to wait and see what happens. She is afebrile right now

## 2017-04-06 NOTE — Telephone Encounter (Signed)
Mitzi informed per VO J Burns to wait and see what happens and can call back if no improvement.

## 2017-04-10 ENCOUNTER — Telehealth: Payer: Self-pay | Admitting: *Deleted

## 2017-04-10 ENCOUNTER — Inpatient Hospital Stay: Attending: Internal Medicine

## 2017-04-10 DIAGNOSIS — E1122 Type 2 diabetes mellitus with diabetic chronic kidney disease: Secondary | ICD-10-CM | POA: Diagnosis not present

## 2017-04-10 DIAGNOSIS — Z9981 Dependence on supplemental oxygen: Secondary | ICD-10-CM | POA: Insufficient documentation

## 2017-04-10 DIAGNOSIS — Z66 Do not resuscitate: Secondary | ICD-10-CM | POA: Insufficient documentation

## 2017-04-10 DIAGNOSIS — C3411 Malignant neoplasm of upper lobe, right bronchus or lung: Secondary | ICD-10-CM | POA: Diagnosis present

## 2017-04-10 DIAGNOSIS — Z79899 Other long term (current) drug therapy: Secondary | ICD-10-CM | POA: Insufficient documentation

## 2017-04-10 DIAGNOSIS — I4891 Unspecified atrial fibrillation: Secondary | ICD-10-CM | POA: Insufficient documentation

## 2017-04-10 DIAGNOSIS — N184 Chronic kidney disease, stage 4 (severe): Secondary | ICD-10-CM | POA: Insufficient documentation

## 2017-04-10 DIAGNOSIS — E039 Hypothyroidism, unspecified: Secondary | ICD-10-CM | POA: Insufficient documentation

## 2017-04-10 DIAGNOSIS — I129 Hypertensive chronic kidney disease with stage 1 through stage 4 chronic kidney disease, or unspecified chronic kidney disease: Secondary | ICD-10-CM | POA: Diagnosis not present

## 2017-04-10 DIAGNOSIS — Z87891 Personal history of nicotine dependence: Secondary | ICD-10-CM | POA: Insufficient documentation

## 2017-04-10 DIAGNOSIS — R531 Weakness: Secondary | ICD-10-CM

## 2017-04-10 DIAGNOSIS — D509 Iron deficiency anemia, unspecified: Secondary | ICD-10-CM

## 2017-04-10 DIAGNOSIS — D649 Anemia, unspecified: Secondary | ICD-10-CM | POA: Diagnosis not present

## 2017-04-10 LAB — CBC WITH DIFFERENTIAL/PLATELET
BASOS ABS: 0 10*3/uL (ref 0–0.1)
Basophils Relative: 0 %
Eosinophils Absolute: 0 10*3/uL (ref 0–0.7)
Eosinophils Relative: 1 %
HEMATOCRIT: 24.1 % — AB (ref 35.0–47.0)
HEMOGLOBIN: 7.9 g/dL — AB (ref 12.0–16.0)
LYMPHS PCT: 15 %
Lymphs Abs: 0.6 10*3/uL — ABNORMAL LOW (ref 1.0–3.6)
MCH: 28.9 pg (ref 26.0–34.0)
MCHC: 32.8 g/dL (ref 32.0–36.0)
MCV: 88 fL (ref 80.0–100.0)
MONO ABS: 0.5 10*3/uL (ref 0.2–0.9)
MONOS PCT: 13 %
NEUTROS ABS: 3 10*3/uL (ref 1.4–6.5)
NEUTROS PCT: 71 %
Platelets: 209 10*3/uL (ref 150–440)
RBC: 2.74 MIL/uL — ABNORMAL LOW (ref 3.80–5.20)
RDW: 19 % — AB (ref 11.5–14.5)
WBC: 4.2 10*3/uL (ref 3.6–11.0)

## 2017-04-10 LAB — SAMPLE TO BLOOD BANK

## 2017-04-10 NOTE — Telephone Encounter (Signed)
Vickki Muff, RN for [facility], have patient come in for lab only today and we will arrange blood if needed for another day. [facility] accepts appointment for 2 PM

## 2017-04-10 NOTE — Telephone Encounter (Signed)
Mitzi called and reports that patient s/o has been trying to call to get patient set up for possible blood transfusion as she is shortness of breath, weak, and very tired. Please advise if she needs to be evaluated or if she should come in for lab, possible transfusion.

## 2017-04-10 NOTE — Telephone Encounter (Signed)
Per Dr. Jacinto Reap- have patient come today for lab only -cbc and tube. If blood transfusion is needed, we can set this up for another day this week.

## 2017-04-11 ENCOUNTER — Other Ambulatory Visit: Payer: Self-pay | Admitting: *Deleted

## 2017-04-11 ENCOUNTER — Telehealth: Payer: Self-pay | Admitting: Internal Medicine

## 2017-04-11 DIAGNOSIS — D500 Iron deficiency anemia secondary to blood loss (chronic): Secondary | ICD-10-CM

## 2017-04-11 LAB — PREPARE RBC (CROSSMATCH)

## 2017-04-11 NOTE — Telephone Encounter (Signed)
Patient needs 1 unit of blood

## 2017-04-11 NOTE — Telephone Encounter (Signed)
Dx:  Weakness; Iron deficiency anemia, uns...   Ref Range & Units 1d ago  WBC 3.6 - 11.0 K/uL 4.2   RBC 3.80 - 5.20 MIL/uL 2.74Low    Hemoglobin 12.0 - 16.0 g/dL 7.9Low    HCT 35.0 - 47.0 % 24.1Low    MCV 80.0 - 100.0 fL 88.0   MCH 26.0 - 34.0 pg 28.9   MCHC 32.0 - 36.0 g/dL 32.8   RDW 11.5 - 14.5 % 19.0High    Platelets 150 - 440 K/uL 209   Neutrophils Relative % % 71   Neutro Abs 1.4 - 6.5 K/uL 3.0   Lymphocytes Relative % 15   Lymphs Abs 1.0 - 3.6 K/uL 0.6Low    Monocytes Relative % 13   Monocytes Absolute 0.2 - 0.9 K/uL 0.5   Eosinophils Relative % 1   Eosinophils Absolute 0 - 0.7 K/uL 0.0   Basophils Relative % 0   Basophils Absolute 0 - 0.1 K/uL 0.0   Comment: Performed at [facility], [address], [place], [place] [address]

## 2017-04-11 NOTE — Telephone Encounter (Signed)
Patient's husband notified that patient will need 1 unit of blood. Orders entered / released and blood bank notified. Pt scheduled for 1:30pm tomorrow.

## 2017-04-11 NOTE — Telephone Encounter (Signed)
Patient needs 1 unit of blood. I will msg the scheduling team to arrange.

## 2017-04-12 ENCOUNTER — Inpatient Hospital Stay

## 2017-04-12 DIAGNOSIS — D500 Iron deficiency anemia secondary to blood loss (chronic): Secondary | ICD-10-CM

## 2017-04-12 DIAGNOSIS — C3411 Malignant neoplasm of upper lobe, right bronchus or lung: Secondary | ICD-10-CM | POA: Diagnosis not present

## 2017-04-12 MED ORDER — HEPARIN SOD (PORK) LOCK FLUSH 100 UNIT/ML IV SOLN
500.0000 [IU] | Freq: Every day | INTRAVENOUS | Status: AC | PRN
  Administered 2017-04-12: 500 [IU]
  Filled 2017-04-12: qty 5

## 2017-04-12 MED ORDER — SODIUM CHLORIDE 0.9 % IV SOLN
250.0000 mL | Freq: Once | INTRAVENOUS | Status: AC
  Administered 2017-04-12: 250 mL via INTRAVENOUS
  Filled 2017-04-12: qty 250

## 2017-04-12 MED ORDER — ACETAMINOPHEN 325 MG PO TABS
650.0000 mg | ORAL_TABLET | Freq: Once | ORAL | Status: AC
  Administered 2017-04-12: 650 mg via ORAL
  Filled 2017-04-12: qty 2

## 2017-04-12 MED ORDER — DIPHENHYDRAMINE HCL 25 MG PO CAPS
25.0000 mg | ORAL_CAPSULE | Freq: Once | ORAL | Status: AC
  Administered 2017-04-12: 25 mg via ORAL
  Filled 2017-04-12: qty 1

## 2017-04-13 LAB — BPAM RBC
BLOOD PRODUCT EXPIRATION DATE: 201904302359
ISSUE DATE / TIME: 201904101358
Unit Type and Rh: 6200

## 2017-04-13 LAB — TYPE AND SCREEN
ABO/RH(D): AB POS
Antibody Screen: NEGATIVE
UNIT DIVISION: 0

## 2017-04-17 ENCOUNTER — Ambulatory Visit

## 2017-04-17 ENCOUNTER — Other Ambulatory Visit

## 2017-04-18 ENCOUNTER — Other Ambulatory Visit: Payer: Self-pay | Admitting: Nurse Practitioner

## 2017-04-18 ENCOUNTER — Other Ambulatory Visit: Payer: Self-pay | Admitting: Internal Medicine

## 2017-05-09 ENCOUNTER — Other Ambulatory Visit: Payer: Self-pay | Admitting: Oncology

## 2017-05-09 ENCOUNTER — Other Ambulatory Visit: Payer: Self-pay | Admitting: Internal Medicine

## 2017-05-11 ENCOUNTER — Telehealth: Payer: Self-pay | Admitting: *Deleted

## 2017-05-11 ENCOUNTER — Other Ambulatory Visit: Payer: Self-pay | Admitting: Internal Medicine

## 2017-05-11 NOTE — Telephone Encounter (Signed)
Mitzi called and reports that patient had to use Roxanol 3 times last week for , she has not used it since then. States per the protocol she is to notify doctor if patient has to use Roxanol out of the comfort kit.

## 2017-05-14 ENCOUNTER — Other Ambulatory Visit: Payer: Self-pay | Admitting: Internal Medicine

## 2017-05-14 ENCOUNTER — Other Ambulatory Visit: Payer: Self-pay | Admitting: Family Medicine

## 2017-05-14 MED ORDER — MORPHINE SULFATE (CONCENTRATE) 20 MG/ML PO SOLN: ORAL | 0 refills | Status: AC

## 2017-05-14 NOTE — Progress Notes (Signed)
Hospice call re: morphine liquid refill- e-scribed.

## 2017-05-15 ENCOUNTER — Ambulatory Visit
Admission: RE | Admit: 2017-05-15 | Discharge: 2017-05-15 | Disposition: A | Discharge status: Home / Self Care | Source: Ambulatory Visit | Attending: Internal Medicine | Admitting: Internal Medicine

## 2017-05-15 ENCOUNTER — Encounter: Payer: Self-pay | Admitting: Internal Medicine

## 2017-05-15 ENCOUNTER — Inpatient Hospital Stay

## 2017-05-15 ENCOUNTER — Other Ambulatory Visit: Payer: Self-pay

## 2017-05-15 ENCOUNTER — Inpatient Hospital Stay: Attending: Internal Medicine

## 2017-05-15 ENCOUNTER — Inpatient Hospital Stay (HOSPITAL_BASED_OUTPATIENT_CLINIC_OR_DEPARTMENT_OTHER): Admitting: Internal Medicine

## 2017-05-15 VITALS — BP 122/68 | HR 108 | Temp 97.0°F | Resp 22 | Ht 59.0 in | Wt 137.0 lb

## 2017-05-15 DIAGNOSIS — Z923 Personal history of irradiation: Secondary | ICD-10-CM | POA: Insufficient documentation

## 2017-05-15 DIAGNOSIS — C3411 Malignant neoplasm of upper lobe, right bronchus or lung: Secondary | ICD-10-CM | POA: Insufficient documentation

## 2017-05-15 DIAGNOSIS — D509 Iron deficiency anemia, unspecified: Secondary | ICD-10-CM

## 2017-05-15 DIAGNOSIS — Z9221 Personal history of antineoplastic chemotherapy: Secondary | ICD-10-CM | POA: Insufficient documentation

## 2017-05-15 DIAGNOSIS — D649 Anemia, unspecified: Secondary | ICD-10-CM | POA: Insufficient documentation

## 2017-05-15 DIAGNOSIS — Z9981 Dependence on supplemental oxygen: Secondary | ICD-10-CM | POA: Insufficient documentation

## 2017-05-15 DIAGNOSIS — R0602 Shortness of breath: Secondary | ICD-10-CM | POA: Insufficient documentation

## 2017-05-15 DIAGNOSIS — Z79899 Other long term (current) drug therapy: Secondary | ICD-10-CM

## 2017-05-15 DIAGNOSIS — J9811 Atelectasis: Secondary | ICD-10-CM | POA: Diagnosis not present

## 2017-05-15 LAB — SAMPLE TO BLOOD BANK

## 2017-05-15 LAB — CBC WITH DIFFERENTIAL/PLATELET
BASOS ABS: 0 10*3/uL (ref 0–0.1)
Basophils Relative: 0 %
EOS PCT: 0 %
Eosinophils Absolute: 0 10*3/uL (ref 0–0.7)
HCT: 25.9 % — ABNORMAL LOW (ref 35.0–47.0)
HEMOGLOBIN: 8.4 g/dL — AB (ref 12.0–16.0)
LYMPHS PCT: 3 %
Lymphs Abs: 0.3 10*3/uL — ABNORMAL LOW (ref 1.0–3.6)
MCH: 28.2 pg (ref 26.0–34.0)
MCHC: 32.7 g/dL (ref 32.0–36.0)
MCV: 86.3 fL (ref 80.0–100.0)
Monocytes Absolute: 0.5 10*3/uL (ref 0.2–0.9)
Monocytes Relative: 5 %
NEUTROS PCT: 92 %
Neutro Abs: 8.4 10*3/uL — ABNORMAL HIGH (ref 1.4–6.5)
PLATELETS: 159 10*3/uL (ref 150–440)
RBC: 3 MIL/uL — AB (ref 3.80–5.20)
RDW: 18.4 % — ABNORMAL HIGH (ref 11.5–14.5)
WBC: 9.2 10*3/uL (ref 3.6–11.0)

## 2017-05-15 MED ORDER — HEPARIN SOD (PORK) LOCK FLUSH 100 UNIT/ML IV SOLN: 500.0000 [IU] | Freq: Once | INTRAVENOUS | Status: AC

## 2017-05-15 MED ORDER — SODIUM CHLORIDE 0.9% FLUSH
10.0000 mL | INTRAVENOUS | Status: AC | PRN
  Filled 2017-05-15: qty 10

## 2017-05-15 NOTE — Progress Notes (Signed)
[place] OFFICE PROGRESS NOTE  Patient Care Team: Lada, Satira Anis, MD as PCP - General (Family Medicine) Manya Silvas, MD (Gastroenterology) Cammie Sickle, MD as Consulting Physician (Oncology) Lavonia Dana, MD as Consulting Physician (Nephrology)  Cancer Staging No matching staging information was found for the patient.   Oncology History   1. 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 CT-guided biopsy on 02/10/11 - insufficient for definite diagnosis. Rare group of atypical cells. Got chemotherapy with Paclitaxel/Carboplatin (started on 02/14/11, completed cycle 6 day 8 on 08/22/11). Also got concurrent radiation. 01/18/12 - Underwent Preoperative bronchoscopy with bronchoalveolar lavage right upper lobe. Right thoracoscopy with pleural biopsy and talc pleurodesis. Negative for malignancy. PET scan on 03/29/12 IMPRESSION: Multifocal pleural activity in the right chest suggesting pleural tumor spread. Activity is also noted in the right hilar region suggesting persistent tumor in this region. April 04, 2012 - VERISTRAT test reported as 'GOOD'. Started Tarceva on 05/17/12. July 2014 - declining performance status, unable to tolerate low dose Tarceva, was on home hospice which was discontinued since patient doing steady.     Lung cancer [facility])    Initial Diagnosis    Lung cancer ([place])       Cancer of upper lobe of right lung ([place])   07/27/2015 Initial Diagnosis    Cancer of upper lobe of right lung ([facility])         INTERVAL HISTORY:  Katrina Kramer 77 y.o.  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  Patient was to have worsening shortness of breath over the last few days.;  Also noted to have worsening cough with phlegm.   Patient denies any hemoptysis.  She has been taking morphine 0.5 to 1 mg [10 to 20 mg] for shortness of breath.  No fevers.  Noted to have increasing weight gain.  Patient has been taking Demadex 3 times a week.  Review of Systems  Constitutional: Positive for malaise/fatigue. Negative for chills, diaphoresis, fever and weight loss.  HENT: Negative for nosebleeds and sore throat.   Eyes: Negative for double vision.  Respiratory: Positive for cough, sputum production, shortness of breath and wheezing. Negative for hemoptysis.   Cardiovascular: Positive for orthopnea and leg swelling. Negative for chest pain and palpitations.  Gastrointestinal: Negative for abdominal pain, blood in stool, constipation, diarrhea, heartburn, melena, nausea and vomiting.  Genitourinary: Negative for dysuria, frequency and urgency.  Musculoskeletal: Negative for back pain and joint pain.  Skin: Negative.  Negative for itching and rash.  Neurological: Negative for dizziness, tingling, focal weakness, weakness and headaches.  Endo/Heme/Allergies: Does not bruise/bleed easily.  Psychiatric/Behavioral: Negative for depression. The patient is not nervous/anxious and does not have insomnia.       PAST MEDICAL HISTORY :  Past Medical History:  Diagnosis Date  . Anemia   . Anemia   . Atrial fibrillation ([place])    Dr. Humphrey Rolls, cardiologist  . CKD (chronic kidney disease) stage 4, GFR 15-29 ml/min ([place]) 05/01/2015  . Diabetes mellitus, type II ([place]) 10/05/2016  . Hypertension   . Hypothyroidism   . Lung cancer ([place])   . Lung cancer ([place])     PAST SURGICAL HISTORY :   Past Surgical History:  Procedure Laterality Date  . COLONOSCOPY N/A 10/11/2014   Procedure: COLONOSCOPY;  Surgeon: Manya Silvas,  MD;  Location: [facility] ENDOSCOPY;  Service: Endoscopy;  Laterality: N/A;  . ESOPHAGOGASTRODUODENOSCOPY N/A 10/10/2014   Procedure: ESOPHAGOGASTRODUODENOSCOPY (EGD);  Surgeon: Manya Silvas, MD;  Location[facility] ENDOSCOPY;   Service: Endoscopy;  Laterality: N/A;  . UPPER GASTROINTESTINAL ENDOSCOPY  02/24/14    FAMILY HISTORY :   Family History  Problem Relation Age of Onset  . Stomach cancer Mother   . Cancer Mother        unsure where but states "it spead all over"  . Alzheimer's disease Father   . Varicose Veins Daughter   . Diabetes Neg Hx   . Heart disease Neg Hx   . Hypertension Neg Hx   . Stroke Neg Hx   . COPD Neg Hx     SOCIAL HISTORY:   Social History   Tobacco Use  . Smoking status: Former Smoker    Packs/day: 0.75    Years: 30.00    Pack years: 22.50    Types: Cigarettes    Last attempt to quit: 01/03/2005    Years since quitting: 12.3  . Smokeless tobacco: Never Used  Substance Use Topics  . Alcohol use: No  . Drug use: No    ALLERGIES:  has No Known Allergies.  MEDICATIONS:  Current Outpatient Medications  Medication Sig Dispense Refill  . acetaminophen (TYLENOL) 325 MG tablet Take 2 tablets (650 mg total) by mouth every 6 (six) hours as needed for mild pain or moderate pain. 2 tablet 0  . albuterol (PROVENTIL) (2.5 MG/3ML) 0.083% nebulizer solution USE 1 VIAL VIA NEBULIZER  EVERY 4 HOURS AS NEEDED FOR WHEEZING OR SHORTNESS OF  BREATH. 1440 mL 2  . amiodarone (PACERONE) 200 MG tablet Take 200 mg by mouth daily.    . benzonatate (TESSALON) 100 MG capsule Take 2 capsules by mouth three times daily. 180 capsule 6  . Calcium Carb-Cholecalciferol (CALCIUM 600/VITAMIN D3 PO) Take 2 tablets by mouth daily.     . digoxin (LANOXIN) 0.125 MG tablet Take 0.125 mg by mouth daily. On Mondays, Wednesdays and Fridays     . docusate sodium (COLACE) 100 MG capsule Take 1 capsule (100 mg total) by mouth 2 (two) times daily as needed for mild constipation. (Patient taking differently: Take 100 mg by mouth daily as needed for mild constipation. ) 20 capsule 0  . feeding supplement, ENSURE ENLIVE, (ENSURE ENLIVE) LIQD Take 237 mLs by mouth 2 (two) times daily between meals. 237 mL 12  . fentaNYL  (DURAGESIC - DOSED MCG/HR) 12 MCG/HR Place 1 patch (12.5 mcg total) onto the skin every 3 (three) days. 5 patch 0  . ferrous gluconate (FERATE) 240 (27 FE) MG tablet Take 240 mg by mouth 3 (three) times daily with meals.    Marland Kitchen GNP COUGH DM ER 30 MG/5ML liquid TAKE 1 TEASPOONFUL EVERY 6 HOURS AS NEEDED FOR COUGH 296 mL 3  . HYDROcodone-acetaminophen (NORCO/VICODIN) 5-325 MG tablet Take 1 tablet by mouth every 6 (six) hours as needed for severe pain. 30 tablet 0  . isosorbide mononitrate (IMDUR) 30 MG 24 hr tablet Take 30 mg by mouth daily.    Marland Kitchen loratadine (CLARITIN) 10 MG tablet Take 1 tablet (10 mg total) by mouth daily. 15 tablet 6  . mirtazapine (REMERON) 15 MG tablet TAKE 1/2 TABLET BY MOUTH AT BEDTIME FOR APPITTE 15 tablet 3  . MUCINEX 600 MG 12 hr tablet TAKE 2 TABLETS BY MOUTH TWICE DAILY 60 tablet 2  . pantoprazole (PROTONIX) 40 MG tablet Take 40 mg  by mouth daily.    . potassium chloride SA (K-DUR,KLOR-CON) 20 MEQ tablet Take 20 mEq by mouth daily.     . Pseudoephedrine-DM-GG (ROBITUSSIN CF PO) Take 5 mLs by mouth as needed (cough).    . ROBAFEN AC 100-10 MG/5ML syrup TAKE 1 TEASPOONFUL BY MOUTH 3 TIMES DAILY AS NEEDED FOR COUGH 236 mL 0  . senna (SENOKOT) 8.6 MG TABS tablet Take 1 tablet (8.6 mg total) by mouth daily. 120 each 0  . torsemide (DEMADEX) 10 MG tablet Take 10 mg by mouth daily.     Marland Kitchen levothyroxine (SYNTHROID, LEVOTHROID) 50 MCG tablet TAKE 1 TABLET BY MOUTH  DAILY FOR 5 DAYS EACH WEEK  AND TAKE ONE-HALF TABLET BY MOUTH DAILY FOR 2 DAYS EACH WEEK 78 tablet 2  . morphine (ROXANOL) 20 MG/ML concentrated solution 0.5-1 ml every 2 hours prn for shortness of breath/pain (Patient not taking: Reported on 05/15/2017) 30 mL 0   No current facility-administered medications for this visit.    Facility-Administered Medications Ordered in Other Visits  Medication Dose Route Frequency Provider Last Rate Last Dose  . 0.9 %  sodium chloride infusion   Intravenous Continuous Leia Alf,  MD 20 mL/hr at 07/04/14 1150    . heparin lock flush 100 unit/mL  500 Units Intravenous Once Leia Alf, MD      . heparin lock flush 100 unit/mL  500 Units Intravenous Once Leia Alf, MD      . heparin lock flush 100 unit/mL  500 Units Intravenous Once Charlaine Dalton R, MD      . sodium chloride flush (NS) 0.9 % injection 10 mL  10 mL Intravenous PRN Cammie Sickle, MD        PHYSICAL EXAMINATION: ECOG PERFORMANCE STATUS: 3 - Symptomatic, >50% confined to bed  BP 122/68   Pulse (!) 108   Temp (!) 97 F (36.1 C) (Tympanic)   Resp (!) 22   Ht 4\' 11"  (1.499 m)   Wt 137 lb (62.1 kg)   SpO2 90%   BMI 27.67 kg/m   Filed Weights   05/15/17 0918  Weight: 137 lb (62.1 kg)    GENERAL: Cachectic appearing female patient resting in the wheelchair.  Alert, no distress and comfortable.  Accompanied by family.  EYES: no pallor or icterus OROPHARYNX: no thrush or ulceration; NECK: supple; no lymph nodes felt. LYMPH:  no palpable lymphadenopathy in the axillary or inguinal regions LUNGS: Decreased breath sounds auscultation bilaterally.  Positive or rhonchi positive crackles bilaterally. HEART/CVS: regular rate & rhythm and no murmurs; 2+ bilateral lower extremity edema ABDOMEN:abdomen soft, non-tender and normal bowel sounds. No hepatomegaly or splenomegaly.  Musculoskeletal:no cyanosis of digits and no clubbing  PSYCH: alert & oriented x 3 with fluent speech NEURO: no focal motor/sensory deficits SKIN:  no rashes or significant lesions    LABORATORY DATA:  I have reviewed the data as listed    Component Value Date/Time   NA 139 02/17/2017 0430   NA 136 08/19/2014 1337   NA 135 (L) 02/08/2014 0844   K 4.4 02/17/2017 0430   K 4.0 02/08/2014 0844   CL 107 02/17/2017 0430   CL 105 02/08/2014 0844   CO2 24 02/17/2017 0430   CO2 21 02/08/2014 0844   GLUCOSE 109 (H) 02/17/2017 0430   GLUCOSE 157 (H) 02/08/2014 0844   BUN 31 (H) 02/17/2017 0430   BUN 32 (H)  08/19/2014 1337   BUN 15 02/08/2014 0844   CREATININE 1.62 (H) 02/17/2017 0430  CREATININE 1.01 (H) 09/30/2016 1134   CALCIUM 8.3 (L) 02/17/2017 0430   CALCIUM 8.8 02/08/2014 0844   PROT 7.7 01/23/2017 0943   PROT 7.9 12/06/2013 1453   ALBUMIN 2.3 (L) 01/23/2017 0943   ALBUMIN 2.8 (L) 12/06/2013 1453   AST 18 01/23/2017 0943   AST 16 12/06/2013 1453   ALT 8 (L) 01/23/2017 0943   ALT 12 (L) 12/06/2013 1453   ALKPHOS 67 01/23/2017 0943   ALKPHOS 82 12/06/2013 1453   BILITOT 0.6 01/23/2017 0943   BILITOT 0.3 12/06/2013 1453   GFRNONAA 30 (L) 02/17/2017 0430   GFRNONAA 55 (L) 09/30/2016 1134   GFRAA 35 (L) 02/17/2017 0430   GFRAA 64 09/30/2016 1134    No results found for: SPEP, UPEP  Lab Results  Component Value Date   WBC 9.2 05/15/2017   NEUTROABS 8.4 (H) 05/15/2017   HGB 8.4 (L) 05/15/2017   HCT 25.9 (L) 05/15/2017   MCV 86.3 05/15/2017   PLT 159 05/15/2017      Chemistry      Component Value Date/Time   NA 139 02/17/2017 0430   NA 136 08/19/2014 1337   NA 135 (L) 02/08/2014 0844   K 4.4 02/17/2017 0430   K 4.0 02/08/2014 0844   CL 107 02/17/2017 0430   CL 105 02/08/2014 0844   CO2 24 02/17/2017 0430   CO2 21 02/08/2014 0844   BUN 31 (H) 02/17/2017 0430   BUN 32 (H) 08/19/2014 1337   BUN 15 02/08/2014 0844   CREATININE 1.62 (H) 02/17/2017 0430   CREATININE 1.01 (H) 09/30/2016 1134      Component Value Date/Time   CALCIUM 8.3 (L) 02/17/2017 0430   CALCIUM 8.8 02/08/2014 0844   ALKPHOS 67 01/23/2017 0943   ALKPHOS 82 12/06/2013 1453   AST 18 01/23/2017 0943   AST 16 12/06/2013 1453   ALT 8 (L) 01/23/2017 0943   ALT 12 (L) 12/06/2013 1453   BILITOT 0.6 01/23/2017 0943   BILITOT 0.3 12/06/2013 1453       RADIOGRAPHIC STUDIES: I have personally reviewed the radiological images as listed and agreed with the findings in the report. Dg Chest 2 View  Result Date: 05/15/2017 CLINICAL DATA:  Right upper lobe neoplasm with shortness of breath EXAM: CHEST  - 2 VIEW COMPARISON:  February 14, 2017 chest radiograph and February 16, 2017 chest CT FINDINGS: There is essentially complete consolidation of the right lung due to combination of mass and consolidation. There is patchy atelectatic change throughout the left mid lower lung zones, stable. No new opacity evident. Port-A-Cath tip is in the superior vena cava. No pneumothorax. Heart is upper normal in size. Pulmonary vascularity on the left appears normal. Pulmonary vascularity on the right cannot be assessed. No adenopathy is appreciable in areas that can be assessed for potential adenopathy. There is aortic atherosclerosis. No evident bone lesions. IMPRESSION: Essentially complete consolidation on the right due to mass and consolidation. Adenopathy on the right cannot be excluded by radiography. Areas of atelectasis on the left remain. No new opacity evident. Stable cardiac silhouette. No pneumothorax appreciable. Electronically Signed   By: Lowella Grip III M.D.   On: 05/15/2017 11:09     ASSESSMENT & PLAN:  Cancer of upper lobe of right lung [facility]) # Lung Cancer- non-small cell lung stage IV: CT scan April 2019- progression; continue hospice.  #Worsening shortness of breath cough-increasing weight gain; question progression of lung cancer/pleural effusion versus CHF.  Recommend chest x-ray; recommend adding BNP.  Clinically no concerns for pneumonia.  Hold off antibiotics.  Recommend taking Demadex 20 mg daily.  # Anemia - hemoglobin stable today at 8.4.  O2 dependent. No prbc tranfusion today.   # port flush- Hospice q 6 weeks.   # follow up in 2 weeks/labs;hold tube; possible PRBC transfusion; [place].    Orders Placed This Encounter  Procedures  . DG Chest 2 View    Standing Status:   Future    Number of Occurrences:   1    Standing Expiration Date:   07/15/2018    Order Specific Question:   Reason for Exam (SYMPTOM  OR DIAGNOSIS REQUIRED)    Answer:   dyspnea    Order Specific Question:    Preferred imaging location?    Answer:   [facility]  . CBC with Differential    Standing Status:   Future    Standing Expiration Date:   05/16/2018  . Hold Tube- Blood Bank    Standing Status:   Future    Standing Expiration Date:   05/16/2018   All questions were answered. The patient knows to call the clinic with any problems, questions or concerns.      Cammie Sickle, MD 05/15/2017 1:45 PM

## 2017-05-15 NOTE — Patient Instructions (Signed)
#  Take a fluid pill-Demadex once a day. #Chest x-ray today-we will follow-up you with regarding results.

## 2017-05-15 NOTE — Assessment & Plan Note (Addendum)
#   Lung Cancer- non-small cell lung stage IV: CT scan April 2019- progression; continue hospice.  #Worsening shortness of breath cough-increasing weight gain; question progression of lung cancer/pleural effusion versus CHF.  Recommend chest x-ray; recommend adding BNP.  Clinically no concerns for pneumonia.  Hold off antibiotics.  Recommend taking Demadex 20 mg daily.  # Anemia - hemoglobin stable today at 8.4.  O2 dependent. No prbc tranfusion today.   # port flush- Hospice q 6 weeks.   # follow up in 2 weeks/labs;hold tube; possible PRBC transfusion; [place].   Addendum:I spoke to hospice RN- re: results of the CXR-worsening likely secondary to progressive malignancy.  Also reviewed with the patient's husband over the phone; discussed that patient is declining fast.  Continue hospice/symptom control.

## 2017-05-16 ENCOUNTER — Telehealth: Payer: Self-pay | Admitting: Internal Medicine

## 2017-05-16 NOTE — Telephone Encounter (Signed)
I spoke to hospice RN- re: results of the CXR-worsening likely secondary to progressive malignancy.  Also reviewed with the patient's husband over the phone; discussed that patient is declining fast.  Continue hospice/symptom control. Dr.B

## 2017-05-18 ENCOUNTER — Telehealth: Payer: Self-pay | Admitting: *Deleted

## 2017-05-26 ENCOUNTER — Other Ambulatory Visit: Payer: Medicare Other

## 2017-05-26 ENCOUNTER — Ambulatory Visit: Payer: Medicare Other

## 2017-05-26 ENCOUNTER — Ambulatory Visit: Payer: Medicare Other | Admitting: Internal Medicine

## 2017-06-03 NOTE — Telephone Encounter (Signed)
Call from hospice to inform of patient death at 1:05 AM this morning.

## 2017-06-03 DEATH — deceased

## 2017-09-08 ENCOUNTER — Ambulatory Visit: Admitting: Family Medicine
